# Patient Record
Sex: Male | Born: 1940 | Hispanic: No | Marital: Married | Smoking: Never smoker
Health system: Southern US, Community
[De-identification: ages and names within clinical notes are randomized; demographics above are authoritative.]

## PROBLEM LIST (undated history)

## (undated) DIAGNOSIS — I639 Cerebral infarction, unspecified: Secondary | ICD-10-CM

## (undated) DIAGNOSIS — E7800 Pure hypercholesterolemia, unspecified: Secondary | ICD-10-CM

## (undated) DIAGNOSIS — I099 Rheumatic heart disease, unspecified: Secondary | ICD-10-CM

## (undated) DIAGNOSIS — N183 Chronic kidney disease, stage 3 unspecified: Secondary | ICD-10-CM

## (undated) DIAGNOSIS — N400 Enlarged prostate without lower urinary tract symptoms: Secondary | ICD-10-CM

## (undated) DIAGNOSIS — E119 Type 2 diabetes mellitus without complications: Secondary | ICD-10-CM

## (undated) DIAGNOSIS — H9190 Unspecified hearing loss, unspecified ear: Secondary | ICD-10-CM

## (undated) DIAGNOSIS — I10 Essential (primary) hypertension: Secondary | ICD-10-CM

## (undated) DIAGNOSIS — Z952 Presence of prosthetic heart valve: Secondary | ICD-10-CM

## (undated) DIAGNOSIS — C169 Malignant neoplasm of stomach, unspecified: Secondary | ICD-10-CM

## (undated) DIAGNOSIS — Z7901 Long term (current) use of anticoagulants: Secondary | ICD-10-CM

## (undated) HISTORY — PX: HERNIA REPAIR: SHX51

## (undated) HISTORY — DX: Cerebral infarction, unspecified: I63.9

---

## 2003-03-14 DIAGNOSIS — C169 Malignant neoplasm of stomach, unspecified: Secondary | ICD-10-CM

## 2003-03-14 HISTORY — PX: PARTIAL GASTRECTOMY: SHX2172

## 2003-03-14 HISTORY — DX: Malignant neoplasm of stomach, unspecified: C16.9

## 2004-09-05 ENCOUNTER — Inpatient Hospital Stay (HOSPITAL_COMMUNITY): Admission: EM | Admit: 2004-09-05 | Discharge: 2004-09-08 | Payer: Self-pay | Admitting: Emergency Medicine

## 2004-09-07 ENCOUNTER — Encounter (INDEPENDENT_AMBULATORY_CARE_PROVIDER_SITE_OTHER): Payer: Self-pay | Admitting: *Deleted

## 2004-09-28 ENCOUNTER — Encounter: Admission: RE | Admit: 2004-09-28 | Discharge: 2004-09-28 | Payer: Self-pay | Admitting: Nephrology

## 2005-01-03 ENCOUNTER — Ambulatory Visit (HOSPITAL_COMMUNITY): Admission: RE | Admit: 2005-01-03 | Discharge: 2005-01-03 | Payer: Self-pay | Admitting: Gastroenterology

## 2005-01-03 ENCOUNTER — Encounter (INDEPENDENT_AMBULATORY_CARE_PROVIDER_SITE_OTHER): Payer: Self-pay | Admitting: *Deleted

## 2005-01-17 ENCOUNTER — Ambulatory Visit (HOSPITAL_COMMUNITY): Admission: RE | Admit: 2005-01-17 | Discharge: 2005-01-17 | Payer: Self-pay | Admitting: Gastroenterology

## 2005-11-24 ENCOUNTER — Ambulatory Visit: Payer: Self-pay | Admitting: Internal Medicine

## 2006-04-17 ENCOUNTER — Ambulatory Visit: Payer: Self-pay | Admitting: Internal Medicine

## 2006-04-17 LAB — CONVERTED CEMR LAB
BUN: 18 mg/dL (ref 6–23)
Creatinine, Ser: 1.3 mg/dL (ref 0.4–1.5)
Creatinine,U: 152.8 mg/dL
Hgb A1c MFr Bld: 6.2 % — ABNORMAL HIGH (ref 4.6–6.0)
Microalb Creat Ratio: 9.8 mg/g (ref 0.0–30.0)
Microalb, Ur: 1.5 mg/dL (ref 0.0–1.9)
Potassium: 3.6 meq/L (ref 3.5–5.1)

## 2006-05-22 ENCOUNTER — Ambulatory Visit: Payer: Self-pay | Admitting: Internal Medicine

## 2006-07-30 ENCOUNTER — Encounter: Payer: Self-pay | Admitting: Internal Medicine

## 2006-08-21 ENCOUNTER — Encounter (INDEPENDENT_AMBULATORY_CARE_PROVIDER_SITE_OTHER): Payer: Self-pay | Admitting: Family Medicine

## 2006-09-27 ENCOUNTER — Encounter (INDEPENDENT_AMBULATORY_CARE_PROVIDER_SITE_OTHER): Payer: Self-pay | Admitting: Family Medicine

## 2007-02-28 ENCOUNTER — Encounter: Payer: Self-pay | Admitting: Internal Medicine

## 2007-04-04 ENCOUNTER — Encounter: Payer: Self-pay | Admitting: Internal Medicine

## 2007-09-03 ENCOUNTER — Telehealth (INDEPENDENT_AMBULATORY_CARE_PROVIDER_SITE_OTHER): Payer: Self-pay | Admitting: *Deleted

## 2007-09-06 ENCOUNTER — Encounter (INDEPENDENT_AMBULATORY_CARE_PROVIDER_SITE_OTHER): Payer: Self-pay | Admitting: *Deleted

## 2007-10-08 ENCOUNTER — Encounter: Payer: Self-pay | Admitting: Internal Medicine

## 2007-11-01 ENCOUNTER — Telehealth (INDEPENDENT_AMBULATORY_CARE_PROVIDER_SITE_OTHER): Payer: Self-pay | Admitting: *Deleted

## 2007-11-07 ENCOUNTER — Telehealth (INDEPENDENT_AMBULATORY_CARE_PROVIDER_SITE_OTHER): Payer: Self-pay | Admitting: *Deleted

## 2008-02-08 ENCOUNTER — Emergency Department (HOSPITAL_COMMUNITY): Admission: EM | Admit: 2008-02-08 | Discharge: 2008-02-08 | Payer: Self-pay | Admitting: Emergency Medicine

## 2008-02-20 ENCOUNTER — Encounter: Payer: Self-pay | Admitting: Internal Medicine

## 2008-03-04 ENCOUNTER — Encounter: Payer: Self-pay | Admitting: Internal Medicine

## 2008-03-17 ENCOUNTER — Encounter: Payer: Self-pay | Admitting: Internal Medicine

## 2008-09-15 ENCOUNTER — Encounter: Payer: Self-pay | Admitting: Internal Medicine

## 2010-04-02 ENCOUNTER — Encounter: Payer: Self-pay | Admitting: Gastroenterology

## 2010-07-29 NOTE — Discharge Summary (Signed)
Robert [name], Robert [name]              ACCOUNT NO.:  [id number]   MEDICAL RECORD NO.:  [id number]          PATIENT TYPE:  INP   LOCATION:  5704                         FACILITY:  [facility]   PHYSICIAN:  Theone Stanley, MD   DATE OF BIRTH:  12/04/1940   DATE OF ADMISSION:  09/05/2004  DATE OF DISCHARGE:                                 DISCHARGE SUMMARY   ADMISSION DIAGNOSES:  1.  Acute gastrointestinal bleed.  2.  Anemia of acute blood loss.  3.  Supratherapeutic INR.  4.  Hypertension.  5.  Valvular disease.  6.  Dyslipidemia.  7.  Benign prostatic hypertrophy.   DISCHARGE DIAGNOSES:  1.  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  2.  Anemia of acute blood loss, stable.  3.  Hypertension.  4.  History of valvular heart disease.  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  5.  Dyslipidemia.  6.  Benign prostatic hypertrophy.   CONSULTATIONS:  Dr. Armanda Magic, Dr. Laural Benes of [facility].   PROCEDURES/DIAGNOSTIC TESTS:  The patient had an EGD performed on June 26  which showed gastric antral ulcer on the incisura.  10 mL of 1:10,000  epinephrine was injected in ulcer.  Echocardiogram was performed on June 28  which showed left ventricular systolic function was normal. EF was 65%.  Left ventricular wall thickness is mildly increased.  There is mild focal  basal septal hypertrophy.  Aortic valve thickness was mildly increased.  Trivial aortic valve regurgitation.  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  There was diastolic doming of the mitral valve consistent with  rheumatic deformity.  There is mild reduced mitral valve leaflet excursion.  Mean transmural gradient was 6.1 mmHg.  Mitral valve area by planimetry was  1.43 sq cm.  Mitral valve area by pressure half-time was 1.73 sq cm.  SBE  prophylaxis is recommended.   PERTINENT LABS:  On discharge, the patient's hemoglobin was 9.   HOSPITAL COURSE:  Robert [name] is a very pleasant gentleman who presented with  what appears to be acute GI bleed with melanotic stools and hematemesis.  He  had evidence of hypotension.  The patient was admitted to step down unit.  He was transfused two units of RBCs.  In addition, it was noted that he was  supratherapeutic at 4.0.  He was given vitamin K and fresh frozen plasma.  In the meantime on the 26th, the patient had an EGD which showed a gastric  ulcer.  This was sclerosed and it stopped bleeding.  The next couple of days  the patient improved.  There was no more evidence of bleeding.   Hypertension.  The patient was on hypertensive medications.  However,  because of his presentation this was held.  He was subsequently restarted on  atenolol.  However, his Diovan dose was cut in half, and he was instructed  to check his blood  pressure on a daily basis.  If at some point in time his  pressures were less than 100 systolic, he is to not take his Diovan at that  point in time.  He is be reassessed by his primary care physician at his  office visit.   Valvular heart disease.  This patient was on Coumadin and according to the  patient his cardiologist placed him on Coumadin secondary to his valvular  disease.  It was unclear since he does not have any type of surgery, no  artificial valve, as to why he was on Coumadin.  Dr. Mayford Knife was consulted in  this regard and echo was performed.  Please see results above.  Coumadin was  stopped obviously for his acute issue, and it is unclear whether he needs to  be on Coumadin henceforth.  The case will be discussed with his cardiologist  between him and Dr. Mayford Knife to see if the patient needs to be on Coumadin at  all.   Dyslipidemia.  The patient is to restart his Crestor.   Benign prostatic hypertrophy.  The patient has started his Flomax.    DISCHARGE MEDICATIONS:  1.  The patient is not to take his Coumadin or niacin.  2.  Crestor 10 mg one p.o. daily.  3.  Atenolol 50 mg one p.o. daily.  4.  Diovan/hydrochlorothiazide 160/12.5 mg one p.o. daily. He is to take his      blood pressure on a daily basis.  If his systolic is less than a 100, he      is not to take his Diovan at that time.  5.  Flomax at home dosage.  6.  Protonix 40 mg one p.o. daily for a total of 12 weeks.   Follow up with Dr. Mayford Knife in a month's time; with Dr. Laural Benes as needed, and  Dr. Blossom Hoops in two to three weeks with [facility] labs.   DIET:  The patient is to continue his vegetarian diet.  However, he is not  to have any spicy foods or any acidic foods that might aggravate his ulcer.   DICTATED BY:  Jasmine Pang, M.D.       AEJ/MEDQ  D:  09/08/2004  T:  09/08/2004  Job:  [id number]

## 2010-07-29 NOTE — H&P (Signed)
Robert [name], Robert [name]              ACCOUNT NO.:  [id number]   MEDICAL RECORD NO.:  [id number]          PATIENT TYPE:  EMS   LOCATION:  MAJO                         FACILITY:  [facility]   PHYSICIAN:  Jackie Plum, M.D.DATE OF BIRTH:  Feb 15, 1941   DATE OF ADMISSION:  09/05/2004  DATE OF DISCHARGE:                                HISTORY & PHYSICAL   PROBLEM LIST:  1.  Acute gastrointestinal bleed, likely upper gastrointestinal source.  2.  Anemia of acute blood loss.  3.  Supratherapeutic INR.  4.  History of hypertension.  5.  Valvular heart disease.  6.  Dyslipidemia.  7.  Benign prostatic hypertrophy.   CHIEF COMPLAINT:  Bloody vomiting and black tarry stools.   HISTORY OF PRESENT ILLNESS:  Patient came to [facility]  ER with his wife with above complaint.  According to the wife and the  patient, patient had been in his usual state of health until some time  yesterday afternoon when he had an episode of vomiting.  He felt very weak  somehow as well.  Then today, he had an episode of bloody vomiting which was  dark in color with black tarry stools.  He felt very weak and his blood  pressure was noted to be low.  Patient was therefore brought to the ED for  further evaluation.  At the emergency room, patient was noted to be very  hypotensive with initial blood pressure in the 70s.  Dr. Danise Edge of  GI medicine was consulted by Dr. Ignacia Palma of emergency medicine, however, he  was at [facility] at the time apparently performing endoscopic  study and patient was admitted to Hospitalists service after initial  resuscitation with IV fluids and initiation of packed red blood cell  transfusion.  Patient denies any history of chest pain.  He feels very  fatigued but does not really have any shortness of breath.  He denies any  fever or chills.  He denies any cough, sputum production.  He denies any  blood in his urine or any hemoptysis.  He denies any  abdominal pain.  He  denies any constipation or diarrhea.  He has been a little bit nauseated  according to the patient.   MEDICATIONS:  The patient's current medications are:  1.  Coumadin 5 mg tablets one tablet daily.  2.  Atenolol 50 mg one tablet daily.  3.  Crestor 10 mg one tablet daily.  4.  Flomax 0.5 mg capsule daily.  5.  Niaspan 500 mg extended release tablets daily.  6.  Diovan/hydrochlorothiazide 150/12.5 mg one in the morning and one in the      evening.   FAMILY HISTORY:  Negative for any known heart disease or any bowel cancers  according to the wife.   SOCIAL HISTORY:  Patient is an immigrant from [place].  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  He has two grown children who  are away from home.  He does not smoke cigarettes nor drink alcohol.   REVIEW OF SYMPTOMS:  Significant positives and  negatives as presented in  HPI, otherwise unremarkable.   PHYSICAL EXAMINATION:  GENERAL APPEARANCE:  Patient was not in acute  cardiopulmonary distress.  He was lying on a gurney receiving oxygen by  nasal cannula.  VITAL SIGNS:  Blood pressure was 88/53, pulse 56, respirations 17,  temperature 97.7 degrees F.  Oxygen 100% by pulse oximetry.  HEENT:  Normocephalic and atraumatic.  Pupils are equal, round and reactive  to light.  Extraocular movements intact.  Pharynx moist.  He had  conjunctival pallor without icterus.  NECK:  Supple.  No JVD.  LUNGS:  Clear to auscultation bilaterally.  CARDIOVASCULAR:  Regular, no gallops.  He had a stage 3/6 systolic murmur,  not radiating, at lower sternal border.  ABDOMEN:  Full, soft and nontender.  There was no obvious hepatosplenomegaly  appreciated by me in the ED.  EXTREMITIES:  No cyanosis or edema.  CNS:  Patient was alert and oriented x3, no acute focal deficit.   LABORATORY DATA:  A wbc count of 15.1, hemoglobin 9.9, hematocrit 29.0,  platelet count 211.  Protime 37.8, INR 3.9, PTT 40.  Sodium  139, potassium  3.6, chloride 102, CO2 29, BUN 94, creatinine 2.4.  Total bilirubin 0.8,  alkaline phosphatase 37, AST 31, ALT 64, total protein 5.6, albumin 3.1,  calcium 8.2.   IMPRESSION:  1.  Acute gastrointestinal bleed.  Patient has elevated BUN related to his      bleed, likely to be upper gastrointestinal source.  2.  He has azotemia in addition to elevated AST.   Patient admitted to ICU, pending endoscopic evaluation by GI medicine.  Will  start  him on PPI, added fluid supplementation with blood transfusion.  We  will hold his antihypertensive medications.  Will give him FFP to correct  his supratherapeutic INR. Vitamin K may be given cautiously to prevent  vitamin K resistance at a later date.  Will get a routine x-ray and 12-lead  EKG.       GO/MEDQ  D:  09/05/2004  T:  09/05/2004  Job:  [id number]   cc:   Leanne Chang, M.D.  [address]  [place]  [place] [address]  Fax: [phone]   Danise Edge, M.D.  [address]  [place]  [place] [address]  Fax: [phone]

## 2010-07-29 NOTE — Op Note (Signed)
NAMESEBRON, Robert [name]              ACCOUNT NO.:  [id number]   MEDICAL RECORD NO.:  [id number]          PATIENT TYPE:  AMB   LOCATION:  ENDO                         [facility]AC[facility]   PHYSICIAN:  Danise Edge, M.D.   DATE OF BIRTH:  1940/05/14   DATE OF PROCEDURE:  01/03/2005  DATE OF DISCHARGE:                                 OPERATIVE REPORT   PROCEDURE:  Esophagogastroduodenoscopy.   PROCEDURE INDICATIONS:  Mr. Robert [name] is a 70 year old male, born  [birth year].  Mr. Robert [name] was hospitalized at [facility] in late  June 2006 to treat a bleeding gastric ulcer on the incisura.  He has  completed a course of proton pump inhibitor therapy.  Repeat  esophagogastroduodenoscopy is performed to confirm gastric ulcer healing.   ENDOSCOPIST:  Dr. Reece Agar   PREMEDICATION:  1.  Demerol 30 mg.  2.  Versed 4 mg.   PROCEDURE:  After obtaining informed consent, Mr. Robert [name] was placed in the  left lateral decubitus position.  I administered intravenous Demerol and  intravenous Versed to achieve conscious sedation for the procedure.  The  patient's blood pressure, oxygen saturation, and cardiac rhythm were  monitored throughout the procedure and documented in the medical record.   The Olympus gastroscope was passed through the posterior hypopharynx into  the proximal esophagus without difficulty.  The hypopharynx and larynx  appeared normal.  I did not visualize the vocal cords.   Esophagoscopy:  The proximal, mid, and lower segments of the esophageal  mucosa appeared normal.   Gastroscopy:  Retroflexed view of the gastric cardia and fundus was normal.  The gastric body appears normal.  The gastric incisura ulcer has healed with  mucosal scarring.  Multiple biopsies were taken.  The gastric antrum and  pylorus appeared normal.   Duodenoscopy:  The duodenal bulb, second portion of duodenum, and third  portion of duodenum appeared normal.   ASSESSMENT:  Healed gastric  incisural ulcer with mucosal scarring at the  ulcer site.  Biopsies were performed.           ______________________________  Danise Edge, M.D.     MJ/MEDQ  D:  01/03/2005  T:  01/03/2005  Job:  [id number]   cc:   Carollee Massed  Fax: [phone]

## 2010-07-29 NOTE — Op Note (Signed)
[name], Robert NO.:  [id number]   MEDICAL RECORD NO.:  [id number]          PATIENT TYPE:  INP   LOCATION:  2115                         FACILITY:  [facility]   PHYSICIAN:  Danise Edge, M.D.   DATE OF BIRTH:  [birth year]   DATE OF PROCEDURE:  09/05/2004  DATE OF DISCHARGE:                                 OPERATIVE REPORT   PROCEDURE:  Esophagogastroduodenoscopy.   PROCEDURE INDICATION:  Robert [name] is a 70 year old male born  30-Jan-1941.  Robert [name] is taking Coumadin.  He presented to the [facility],  emergency room with upper gastrointestinal bleeding.  There is no history of  peptic ulcer disease.  He denies nonsteroidal anti-inflammatory medication  use.   ENDOSCOPIST:  Danise Edge, M.D.   PREMEDICATION:  Versed 6 mg, fentanyl 50 mcg.   PROCEDURE:  After obtaining informed consent, Robert [name] was placed in the  left lateral decubitus position.  I administered intravenous fentanyl and  intravenous Versed to achieve conscious sedation for the procedure.  The  patient's blood pressure, oxygen saturation and cardiac rhythm were  monitored throughout the procedure and documented in the medical record.   The Olympus gastroscope was passed through the posterior hypopharynx into  the proximal esophagus without difficulty.  The hypopharynx, larynx and  vocal cords appeared normal.   ESOPHAGOSCOPY:  The proximal, mid and lower segments of the esophageal  mucosa appeared normal.   GASTROSCOPY:  Upon entering the proximal stomach, there was a large, red,  gelatinous blood clot lying along the greater curvature aspect of the  gastric body and into the gastric fundus.  Retroflexed view of the gastric  cardia and what I had could visualize of the gastric fundus appeared normal.  What I could visualize of the gastric body appeared normal.  On the incisura  was a 3 x 3-mm ulcer with central blood clot.  The remainder of the gastric  antrum and pylorus  appeared normal.  Ten milliliters of 1:10,000 epinephrine  were injected in 2-mL increments around the ulcer and centrally into the  ulcer base.  The blood clot washed off with water irrigation.  Two Endo  Clips were applied to the ulcer base.   DUODENOSCOPY:  The duodenal bulb and descending duodenum appeared normal.   ASSESSMENT:  Upper gastrointestinal bleeding secondary to a gastric antral  ulcer on the incisura.  Two Endo Clips were applied to the ulcer base  preceded by injecting 10 mL of 1:10,000 epinephrine.  A biopsy was taken  from the distal gastric antrum for CLO-test.   This ulcer is at high risk for rebleeding.  His Coumadin has been reversed  with fresh frozen plasma.  He is receiving packed red blood cell  transfusion.  He is hemodynamically stable.  I will start a continuous  intravenous Protonix drip.   If rebleeding occurs, repeat endoscopic therapy should be performed.       MJ/MEDQ  D:  09/05/2004  T:  09/06/2004  Job:  [id number]   cc:   [facility]

## 2013-03-13 DIAGNOSIS — Z952 Presence of prosthetic heart valve: Secondary | ICD-10-CM

## 2013-03-13 HISTORY — DX: Presence of prosthetic heart valve: Z95.2

## 2013-10-24 HISTORY — PX: MITRAL VALVE REPLACEMENT: SHX147

## 2015-05-06 ENCOUNTER — Emergency Department (HOSPITAL_COMMUNITY): Payer: BC Managed Care – PPO

## 2015-05-06 ENCOUNTER — Inpatient Hospital Stay (HOSPITAL_COMMUNITY)
Admission: EM | Admit: 2015-05-06 | Discharge: 2015-05-13 | DRG: 286 | Disposition: A | Discharge status: Home / Self Care | Payer: BC Managed Care – PPO | Attending: Internal Medicine | Admitting: Internal Medicine

## 2015-05-06 ENCOUNTER — Encounter (HOSPITAL_COMMUNITY): Payer: Self-pay | Admitting: Family Medicine

## 2015-05-06 DIAGNOSIS — I160 Hypertensive urgency: Secondary | ICD-10-CM | POA: Diagnosis present

## 2015-05-06 DIAGNOSIS — R0600 Dyspnea, unspecified: Secondary | ICD-10-CM | POA: Diagnosis not present

## 2015-05-06 DIAGNOSIS — Z903 Acquired absence of stomach [part of]: Secondary | ICD-10-CM

## 2015-05-06 DIAGNOSIS — Z880 Allergy status to penicillin: Secondary | ICD-10-CM

## 2015-05-06 DIAGNOSIS — Z7982 Long term (current) use of aspirin: Secondary | ICD-10-CM | POA: Diagnosis not present

## 2015-05-06 DIAGNOSIS — E1122 Type 2 diabetes mellitus with diabetic chronic kidney disease: Secondary | ICD-10-CM | POA: Diagnosis present

## 2015-05-06 DIAGNOSIS — Z85028 Personal history of other malignant neoplasm of stomach: Secondary | ICD-10-CM | POA: Diagnosis not present

## 2015-05-06 DIAGNOSIS — I509 Heart failure, unspecified: Secondary | ICD-10-CM

## 2015-05-06 DIAGNOSIS — N400 Enlarged prostate without lower urinary tract symptoms: Secondary | ICD-10-CM | POA: Diagnosis present

## 2015-05-06 DIAGNOSIS — I2510 Atherosclerotic heart disease of native coronary artery without angina pectoris: Secondary | ICD-10-CM | POA: Diagnosis present

## 2015-05-06 DIAGNOSIS — D649 Anemia, unspecified: Secondary | ICD-10-CM | POA: Diagnosis present

## 2015-05-06 DIAGNOSIS — E785 Hyperlipidemia, unspecified: Secondary | ICD-10-CM | POA: Diagnosis present

## 2015-05-06 DIAGNOSIS — Z7901 Long term (current) use of anticoagulants: Secondary | ICD-10-CM | POA: Diagnosis not present

## 2015-05-06 DIAGNOSIS — H9190 Unspecified hearing loss, unspecified ear: Secondary | ICD-10-CM | POA: Diagnosis present

## 2015-05-06 DIAGNOSIS — I5023 Acute on chronic systolic (congestive) heart failure: Secondary | ICD-10-CM | POA: Diagnosis present

## 2015-05-06 DIAGNOSIS — I5022 Chronic systolic (congestive) heart failure: Secondary | ICD-10-CM | POA: Diagnosis present

## 2015-05-06 DIAGNOSIS — N183 Chronic kidney disease, stage 3 unspecified: Secondary | ICD-10-CM | POA: Diagnosis present

## 2015-05-06 DIAGNOSIS — I5041 Acute combined systolic (congestive) and diastolic (congestive) heart failure: Secondary | ICD-10-CM | POA: Diagnosis not present

## 2015-05-06 DIAGNOSIS — E876 Hypokalemia: Secondary | ICD-10-CM | POA: Diagnosis present

## 2015-05-06 DIAGNOSIS — R0602 Shortness of breath: Secondary | ICD-10-CM | POA: Diagnosis present

## 2015-05-06 DIAGNOSIS — Z952 Presence of prosthetic heart valve: Secondary | ICD-10-CM

## 2015-05-06 DIAGNOSIS — I130 Hypertensive heart and chronic kidney disease with heart failure and stage 1 through stage 4 chronic kidney disease, or unspecified chronic kidney disease: Principal | ICD-10-CM | POA: Diagnosis present

## 2015-05-06 DIAGNOSIS — Z8711 Personal history of peptic ulcer disease: Secondary | ICD-10-CM

## 2015-05-06 DIAGNOSIS — D638 Anemia in other chronic diseases classified elsewhere: Secondary | ICD-10-CM | POA: Diagnosis present

## 2015-05-06 DIAGNOSIS — Z79899 Other long term (current) drug therapy: Secondary | ICD-10-CM

## 2015-05-06 DIAGNOSIS — I272 Other secondary pulmonary hypertension: Secondary | ICD-10-CM | POA: Diagnosis present

## 2015-05-06 DIAGNOSIS — Z954 Presence of other heart-valve replacement: Secondary | ICD-10-CM | POA: Diagnosis not present

## 2015-05-06 HISTORY — DX: Type 2 diabetes mellitus without complications: E11.9

## 2015-05-06 HISTORY — DX: Pure hypercholesterolemia, unspecified: E78.00

## 2015-05-06 HISTORY — DX: Chronic kidney disease, stage 3 unspecified: N18.30

## 2015-05-06 HISTORY — DX: Chronic kidney disease, stage 3 (moderate): N18.3

## 2015-05-06 HISTORY — DX: Benign prostatic hyperplasia without lower urinary tract symptoms: N40.0

## 2015-05-06 HISTORY — DX: Rheumatic heart disease, unspecified: I09.9

## 2015-05-06 HISTORY — DX: Unspecified hearing loss, unspecified ear: H91.90

## 2015-05-06 HISTORY — DX: Long term (current) use of anticoagulants: Z79.01

## 2015-05-06 HISTORY — DX: Malignant neoplasm of stomach, unspecified: C16.9

## 2015-05-06 HISTORY — DX: Presence of prosthetic heart valve: Z95.2

## 2015-05-06 HISTORY — DX: Essential (primary) hypertension: I10

## 2015-05-06 LAB — BASIC METABOLIC PANEL
Anion gap: 9 (ref 5–15)
BUN: 14 mg/dL (ref 6–20)
CO2: 25 mmol/L (ref 22–32)
Calcium: 8 mg/dL — ABNORMAL LOW (ref 8.9–10.3)
Chloride: 108 mmol/L (ref 101–111)
Creatinine, Ser: 1.59 mg/dL — ABNORMAL HIGH (ref 0.61–1.24)
GFR calc Af Amer: 47 mL/min — ABNORMAL LOW (ref >60)
GFR, EST NON AFRICAN AMERICAN: 41 mL/min — AB (ref >60)
Glucose, Bld: 277 mg/dL — ABNORMAL HIGH (ref 65–99)
Potassium: 4.4 mmol/L (ref 3.5–5.1)
Sodium: 142 mmol/L (ref 135–145)

## 2015-05-06 LAB — CBC
HCT: 29.4 % — ABNORMAL LOW (ref 39.0–52.0)
Hemoglobin: 9.4 g/dL — ABNORMAL LOW (ref 13.0–17.0)
MCH: 30.2 pg (ref 26.0–34.0)
MCHC: 32 g/dL (ref 30.0–36.0)
MCV: 94.5 fL (ref 78.0–100.0)
PLATELETS: 218 10*3/uL (ref 150–400)
RBC: 3.11 MIL/uL — ABNORMAL LOW (ref 4.22–5.81)
RDW: 14 % (ref 11.5–15.5)
WBC: 5.2 10*3/uL (ref 4.0–10.5)

## 2015-05-06 LAB — RETICULOCYTES
RBC.: 3.15 MIL/uL — ABNORMAL LOW (ref 4.22–5.81)
Retic Count, Absolute: 69.3 10*3/uL (ref 19.0–186.0)
Retic Ct Pct: 2.2 % (ref 0.4–3.1)

## 2015-05-06 LAB — IRON AND TIBC
Iron: 32 ug/dL — ABNORMAL LOW (ref 45–182)
Saturation Ratios: 9 % — ABNORMAL LOW (ref 17.9–39.5)
TIBC: 372 ug/dL (ref 250–450)
UIBC: 340 ug/dL

## 2015-05-06 LAB — I-STAT TROPONIN, ED: Troponin i, poc: 0.02 ng/mL (ref 0.00–0.08)

## 2015-05-06 LAB — PROTIME-INR
INR: 4.37 — ABNORMAL HIGH (ref 0.00–1.49)
Prothrombin Time: 40.6 seconds — ABNORMAL HIGH (ref 11.6–15.2)

## 2015-05-06 LAB — BRAIN NATRIURETIC PEPTIDE: B Natriuretic Peptide: 1406.9 pg/mL — ABNORMAL HIGH (ref 0.0–100.0)

## 2015-05-06 LAB — FERRITIN: Ferritin: 37 ng/mL (ref 24–336)

## 2015-05-06 LAB — VITAMIN B12: Vitamin B-12: 114 pg/mL — ABNORMAL LOW (ref 180–914)

## 2015-05-06 MED ORDER — TAMSULOSIN HCL 0.4 MG PO CAPS
0.4000 mg | ORAL_CAPSULE | Freq: Every day | ORAL | Status: DC
  Administered 2015-05-07 – 2015-05-12 (×5): 0.4 mg via ORAL
  Filled 2015-05-06 (×5): qty 1

## 2015-05-06 MED ORDER — NITROGLYCERIN 0.4 MG SL SUBL: 0.4000 mg | SUBLINGUAL_TABLET | SUBLINGUAL | Status: DC | PRN

## 2015-05-06 MED ORDER — ACETAMINOPHEN 325 MG PO TABS: 650.0000 mg | ORAL_TABLET | Freq: Four times a day (QID) | ORAL | Status: DC | PRN

## 2015-05-06 MED ORDER — ONDANSETRON HCL 4 MG/2ML IJ SOLN: 4.0000 mg | Freq: Four times a day (QID) | INTRAMUSCULAR | Status: DC | PRN

## 2015-05-06 MED ORDER — FERROUS SULFATE 325 (65 FE) MG PO TABS
325.0000 mg | ORAL_TABLET | Freq: Two times a day (BID) | ORAL | Status: DC
  Administered 2015-05-07 – 2015-05-13 (×11): 325 mg via ORAL
  Filled 2015-05-06 (×11): qty 1

## 2015-05-06 MED ORDER — FUROSEMIDE 10 MG/ML IJ SOLN
40.0000 mg | Freq: Two times a day (BID) | INTRAMUSCULAR | Status: DC
  Administered 2015-05-07: 40 mg via INTRAVENOUS
  Filled 2015-05-06: qty 4

## 2015-05-06 MED ORDER — NITROGLYCERIN 0.4 MG/HR TD PT24
0.4000 mg | MEDICATED_PATCH | Freq: Every day | TRANSDERMAL | Status: DC
  Administered 2015-05-07 – 2015-05-08 (×2): 0.4 mg via TRANSDERMAL
  Filled 2015-05-06 (×3): qty 1

## 2015-05-06 MED ORDER — ATORVASTATIN CALCIUM 20 MG PO TABS
20.0000 mg | ORAL_TABLET | Freq: Every day | ORAL | Status: DC
  Administered 2015-05-07 – 2015-05-12 (×7): 20 mg via ORAL
  Filled 2015-05-06 (×2): qty 1
  Filled 2015-05-06: qty 2
  Filled 2015-05-06 (×4): qty 1

## 2015-05-06 MED ORDER — ADULT MULTIVITAMIN W/MINERALS CH
1.0000 | ORAL_TABLET | Freq: Every day | ORAL | Status: DC
  Administered 2015-05-07 – 2015-05-13 (×7): 1 via ORAL
  Filled 2015-05-06 (×7): qty 1

## 2015-05-06 MED ORDER — ONDANSETRON HCL 4 MG PO TABS: 4.0000 mg | ORAL_TABLET | Freq: Four times a day (QID) | ORAL | Status: DC | PRN

## 2015-05-06 MED ORDER — SODIUM CHLORIDE 0.9% FLUSH
3.0000 mL | Freq: Two times a day (BID) | INTRAVENOUS | Status: DC
  Administered 2015-05-07: 3 mL via INTRAVENOUS

## 2015-05-06 MED ORDER — ASPIRIN 81 MG PO CHEW
81.0000 mg | CHEWABLE_TABLET | Freq: Every day | ORAL | Status: DC
  Administered 2015-05-07 – 2015-05-09 (×3): 81 mg via ORAL
  Filled 2015-05-06 (×3): qty 1

## 2015-05-06 MED ORDER — IRBESARTAN 75 MG PO TABS
37.5000 mg | ORAL_TABLET | Freq: Every day | ORAL | Status: DC
  Administered 2015-05-07: 37.5 mg via ORAL
  Filled 2015-05-06 (×2): qty 0.5

## 2015-05-06 MED ORDER — FUROSEMIDE 10 MG/ML IJ SOLN
40.0000 mg | Freq: Once | INTRAMUSCULAR | Status: AC
  Administered 2015-05-06: 40 mg via INTRAVENOUS
  Filled 2015-05-06: qty 4

## 2015-05-06 MED ORDER — SPIRONOLACTONE 25 MG PO TABS
25.0000 mg | ORAL_TABLET | Freq: Every day | ORAL | Status: DC
  Administered 2015-05-07: 25 mg via ORAL
  Filled 2015-05-06: qty 1

## 2015-05-06 MED ORDER — LABETALOL HCL 5 MG/ML IV SOLN
10.0000 mg | Freq: Once | INTRAVENOUS | Status: DC
  Administered 2015-05-06: 10 mg via INTRAVENOUS
  Filled 2015-05-06: qty 4

## 2015-05-06 MED ORDER — HYDRALAZINE HCL 20 MG/ML IJ SOLN: 10.0000 mg | INTRAMUSCULAR | Status: DC | PRN

## 2015-05-06 MED ORDER — ACETAMINOPHEN 650 MG RE SUPP: 650.0000 mg | Freq: Four times a day (QID) | RECTAL | Status: DC | PRN

## 2015-05-06 NOTE — H&P (Addendum)
[facility] History and Physical  Robert [name] [id number] DOB: 1940/12/16 DOA: 05/06/2015  Referring physician: Dr. Alberteen Sam. PCP: [name], MD  Specialists: Patient follows up at [facility] and [facility].  Chief Complaint: Shortness of breath.  HPI: Robert [name] is a 75 y.o. male with history of mechanical mitral valve, hypertension, chronic anemia chronic disease and history of gastric ulcer presents to the ER because of worsening shortness of breath. Patient has been getting increasingly short of breath on exertion over the last 4-5 days and patient's primary care physician had placed patient on Lasix first dose of which patient took yesterday. Despite taking which patient was still short of breath and had gone to the urgent care yesterday and was given spironolactone. Patient became acutely short of breath and presented to the ER. Chest x-ray shows pulmonary edema and patient's blood pressure was significantly elevated with systolic blood pressure more than 200. Patient denies any chest pain fever chills have some productive cough. Patient has been admitted for acute pulmonary edema and hypertensive urgency.   Review of Systems: As presented in the history of presenting illness, rest negative.  Past Medical History  Diagnosis Date  . Hypertension   . High cholesterol   . Gastric ulcer    Past Surgical History  Procedure Laterality Date  . Cardiac surgery     Social History:  reports that he has never smoked. He does not have any smokeless tobacco history on file. He reports that he does not drink alcohol or use illicit drugs. Where does patient live home. Can patient participate in ADLs? Yes.  Allergies  Allergen Reactions  . Penicillins Other (See Comments)    Family History:  Family History  Problem Relation Age of Onset  . Hypertension Other       Prior to Admission medications   Medication Sig Start Date End Date Taking?  Authorizing Provider  aspirin 81 MG chewable tablet Chew 81 mg by mouth daily.   Yes Historical Provider, MD  atorvastatin (LIPITOR) 20 MG tablet Take 20 mg by mouth at bedtime.   Yes Historical Provider, MD  ferrous sulfate 325 (65 FE) MG tablet Take 325 mg by mouth 2 (two) times daily with a meal.   Yes Historical Provider, MD  furosemide (LASIX) 40 MG tablet Take 40 mg by mouth daily.   Yes Historical Provider, MD  Multiple Vitamin (MULTIVITAMIN WITH MINERALS) TABS tablet Take 1 tablet by mouth daily.   Yes Historical Provider, MD  spironolactone (ALDACTONE) 25 MG tablet Take 25 mg by mouth daily.   Yes Historical Provider, MD  tamsulosin (FLOMAX) 0.4 MG CAPS capsule Take 0.4 mg by mouth daily after supper.   Yes Historical Provider, MD  valsartan (DIOVAN) 80 MG tablet Take 80 mg by mouth daily.   Yes Historical Provider, MD  warfarin (COUMADIN) 3 MG tablet Take 3 mg by mouth See admin instructions. Take 1 and 1/2 tablets every day except take 1 tablet on Sunday, Tuesday and Friday   Yes Historical Provider, MD    Physical Exam: Filed Vitals:   05/06/15 2215 05/06/15 2230 05/06/15 2245 05/06/15 2300  BP: 150/135 149/69 165/78 151/73  Pulse: 82 84 75 74  Temp:      TempSrc:      Resp: 18 20 17 16   Weight:      SpO2: 98% 100% 100% 98%     General:  Moderately built and nourished.  Eyes: Anicteric no pallor.  ENT: No discharge  from the ears eyes nose and mouth.  Neck: JVD elevated. No mass felt.  Cardiovascular: S1 and S2 heard.  Respiratory: No rhonchi or crepitations.  Abdomen: Soft nontender bowel sounds present.  Skin: No rash.  Musculoskeletal: Mild bilateral lower extremity edema.  Psychiatric: Appears normal.  Neurologic: Alert awake oriented to time place and person. Moves all extremities.  Labs on Admission:  Basic Metabolic Panel:  Recent Labs Lab 05/06/15 1406  NA 142  K 4.4  CL 108  CO2 25  GLUCOSE 277*  BUN 14  CREATININE 1.59*  CALCIUM 8.0*    Liver Function Tests: No results for input(s): AST, ALT, ALKPHOS, BILITOT, PROT, ALBUMIN in the last 168 hours. No results for input(s): LIPASE, AMYLASE in the last 168 hours. No results for input(s): AMMONIA in the last 168 hours. CBC:  Recent Labs Lab 05/06/15 1406  WBC 5.2  HGB 9.4*  HCT 29.4*  MCV 94.5  PLT 218   Cardiac Enzymes: No results for input(s): CKTOTAL, CKMB, CKMBINDEX, TROPONINI in the last 168 hours.  BNP (last 3 results)  Recent Labs  05/06/15 1406  BNP 1406.9*    ProBNP (last 3 results) No results for input(s): PROBNP in the last 8760 hours.  CBG: No results for input(s): GLUCAP in the last 168 hours.  Radiological Exams on Admission: Dg Chest 2 View  05/06/2015  CLINICAL DATA:  Shortness of breath, weakness and cough for 2 days, hypertension EXAM: CHEST  2 VIEW COMPARISON:  05/05/2015 FINDINGS: Enlargement of cardiac silhouette post MVR. Mediastinal contours and pulmonary vascularity normal. Atherosclerotic calcification aorta. Emphysematous and minimal bronchitic changes consistent with COPD. Bibasilar small pleural effusions greater on RIGHT. Minimal basilar atelectasis bilaterally. Upper lungs clear. No pneumothorax. Bones demineralized. IMPRESSION: Enlargement of cardiac silhouette post MVR. COPD changes with bibasilar small pleural effusions and basilar atelectasis. When compared to the previous exam, slightly improved bibasilar aeration. Electronically Signed   By: Lavonia Dana M.D.   On: 05/06/2015 14:28    EKG: Independently reviewed. Normal sinus rhythm with nonspecific ST changes.  Assessment/Plan Principal Problem:   Acute on chronic systolic (congestive) heart failure (HCC) Active Problems:   Hypertensive urgency   CKD (chronic kidney disease) stage 3, GFR 30-59 ml/min   Chronic anemia   H/O mitral valve replacement with mechanical valve   CHF (congestive heart failure) ([place])   1. Acute pulmonary edema probably from decompensated  systolic heart failure - patient was given Lasix 40 mg IV and labetalol 1 dose by the ER physician following which patient's blood pressure has improved at this time. Patient's breathing also has improved. Patient will be closely monitored in telemetry and I have placed patient on Lasix 40 mg IV every 12. Patient has been recently started on spironolactone history which we will continue for now and closely follow daily weights metabolic panel. Check 2-D echo. Cycle cardiac markers. 2. Hypertensive urgency - at this time include Lasix and labetalol given by the ER physician. Will try to work any beta blockers for now due to acute decompensated CHF. Closely monitor blood pressure trends. Patient is on ARB and recently added spironolactone. If creatinine worsens then may have to hold. 3. Chronic kidney disease stage III - creatinine appears to be at baseline. Closely follow metabolic panel. See #2. 4. Chronic anemia with history of gastric ulcer and iron deficiency - continue iron supplements follow CBC. 5. History of mechanical mitral valve replacement on Coumadin - Coumadin per pharmacy. Since patient has acute decompensation check 2-D  echo. 6. Hyperglycemia - check hemoglobin A1c.   DVT Prophylaxis Coumadin.  Code Status: Full code.  Family Communication: Patient's wife.  Disposition Plan: Admit to inpatient.    KAKRAKANDY,ARSHAD N. [facility] Pager [phone].  If 7PM-7AM, please contact night-coverage [email or web address] Password [facility] 05/06/2015, 11:23 PM

## 2015-05-06 NOTE — ED Notes (Signed)
Dr kakrakandy at bedside.  

## 2015-05-06 NOTE — ED Notes (Signed)
Dr Hal Hope

## 2015-05-06 NOTE — ED Notes (Signed)
Pt here for SOB x 1 week and BLE swelling worse in the last 24 hours.

## 2015-05-06 NOTE — ED Provider Notes (Signed)
CSN: [id number]     Arrival date & time 05/06/15  1357 History   First MD Initiated Contact with Patient 05/06/15 2005     Chief Complaint  Patient presents with  . Shortness of Breath     (Consider location/radiation/quality/duration/timing/severity/associated sxs/prior Treatment) Patient is a 75 y.o. male presenting with shortness of breath. The history is provided by the patient and the spouse.  Shortness of Breath Severity:  Moderate Onset quality:  Gradual Duration:  2 weeks Timing:  Constant Progression:  Unchanged Chronicity:  New Context: not URI   Context comment:  Gaining weight, 10 lb weight gain in 10 days Relieved by:  Nothing Worsened by:  Nothing tried Ineffective treatments:  None tried   Past Medical History  Diagnosis Date  . Hypertension   . High cholesterol    Past Surgical History  Procedure Laterality Date  . Cardiac surgery     History reviewed. No pertinent family history. Social History  Substance Use Topics  . Smoking status: Never Smoker   . Smokeless tobacco: None  . Alcohol Use: No    Review of Systems  Respiratory: Positive for shortness of breath.   All other systems reviewed and are negative.     Allergies  Penicillins  Home Medications   Prior to Admission medications   Not on File   BP 204/97 mmHg  Pulse 79  Temp(Src) 97.9 F (36.6 C) (Oral)  Resp 17  Wt 136 lb (61.689 kg)  SpO2 100% Physical Exam  Constitutional: He is oriented to person, place, and time. He appears well-developed and well-nourished. No distress.  HENT:  Head: Normocephalic and atraumatic.  Eyes: Conjunctivae are normal.  Neck: Neck supple. No tracheal deviation present.  Cardiovascular: Normal rate, regular rhythm and normal heart sounds.   Pulmonary/Chest: Effort normal. No respiratory distress. He has no wheezes. He has rales (bibasilar).  Abdominal: Soft. He exhibits no distension. There is no tenderness. There is no rebound.   Neurological: He is alert and oriented to person, place, and time.  Skin: Skin is warm and dry.  Psychiatric: He has a normal mood and affect.    ED Course  Procedures (including critical care time)  Emergency Focused Ultrasound Exam Limited Ultrasound of the Heart and Pericardium  Performed and interpreted by Dr. Laneta Simmers Indication: shortness of breath Multiple views of the heart, pericardium, and IVC are obtained with a multi frequency probe.  Findings: decreased contractility, no anechoic fluid, no IVC collapse Interpretation: severely depressed ejection fraction, no pericardial effusion, elevated CVP Images archived electronically.  CPT Code: [id number]   Labs Review Labs Reviewed  BASIC METABOLIC PANEL - Abnormal; Notable for the following:    Glucose, Bld 277 (*)    Creatinine, Ser 1.59 (*)    Calcium 8.0 (*)    GFR calc non Af Amer 41 (*)    GFR calc Af Amer 47 (*)    All other components within normal limits  CBC - Abnormal; Notable for the following:    RBC 3.11 (*)    Hemoglobin 9.4 (*)    HCT 29.4 (*)    All other components within normal limits  BRAIN NATRIURETIC PEPTIDE - Abnormal; Notable for the following:    B Natriuretic Peptide 1406.9 (*)    All other components within normal limits  PROTIME-INR - Abnormal; Notable for the following:    Prothrombin Time 40.6 (*)    INR 4.37 (*)    All other components within normal limits  VITAMIN B12 -  Abnormal; Notable for the following:    Vitamin B-12 114 (*)    All other components within normal limits  IRON AND TIBC - Abnormal; Notable for the following:    Iron 32 (*)    Saturation Ratios 9 (*)    All other components within normal limits  RETICULOCYTES - Abnormal; Notable for the following:    RBC. 3.15 (*)    All other components within normal limits  FOLATE  FERRITIN  TROPONIN I  TROPONIN I  TROPONIN I  COMPREHENSIVE METABOLIC PANEL  CBC WITH DIFFERENTIAL/PLATELET  PROTIME-INR  Randolm Idol, ED     Imaging Review Dg Chest 2 View  05/06/2015  CLINICAL DATA:  Shortness of breath, weakness and cough for 2 days, hypertension EXAM: CHEST  2 VIEW COMPARISON:  05/05/2015 FINDINGS: Enlargement of cardiac silhouette post MVR. Mediastinal contours and pulmonary vascularity normal. Atherosclerotic calcification aorta. Emphysematous and minimal bronchitic changes consistent with COPD. Bibasilar small pleural effusions greater on RIGHT. Minimal basilar atelectasis bilaterally. Upper lungs clear. No pneumothorax. Bones demineralized. IMPRESSION: Enlargement of cardiac silhouette post MVR. COPD changes with bibasilar small pleural effusions and basilar atelectasis. When compared to the previous exam, slightly improved bibasilar aeration. Electronically Signed   By: Lavonia Dana M.D.   On: 05/06/2015 14:28   I have personally reviewed and evaluated these images and lab results as part of my medical decision-making.   EKG Interpretation   Date/Time:  Thursday May 06 2015 14:02:56 EST Ventricular Rate:  78 PR Interval:  156 QRS Duration: 78 QT Interval:  390 QTC Calculation: 444 R Axis:   49 Text Interpretation:  Sinus rhythm with Premature supraventricular  complexes Septal infarct , age undetermined Lateral infarct , age  undetermined Abnormal ECG Since last tracing prominent wave in V1 is  improved Confirmed by Miniya Miguez MD, Jacobb Alen [phone]) on 05/06/2015 8:18:28 PM      MDM   Final diagnoses:  Acute on chronic systolic heart failure (HCC)  Shortness of breath   75 y.o. male presents with ongoing leg swelling and shortness of breath worsening in the last week. Has history of MVR remotely and partial gastrectomy. Has gained roughly 10 lbs in 10 days. Was given lasix at urgent care but has been unable to get in touch with his cardiologist to establish follow up. Bedside US and clinical syndrome c/w acute CHF exacerbation. Patient is hypertensive here. BNP dramatically elevated. Pt with chronic  anemia and brother who is a Psychologist, sport and exercise out of state is requesting further anemia workup. Will require admission for formal echo and evaluation of prosthetic mitral valve given decline in status. Hospitalist was consulted for admission and will see the patient in the emergency department.    Primary cardiologist is in [place]: Margaretmary Bayley, MD 05/07/15 [phone]

## 2015-05-07 ENCOUNTER — Encounter (HOSPITAL_COMMUNITY): Payer: Self-pay | Admitting: Physician Assistant

## 2015-05-07 ENCOUNTER — Inpatient Hospital Stay (HOSPITAL_COMMUNITY): Payer: BC Managed Care – PPO

## 2015-05-07 ENCOUNTER — Ambulatory Visit (HOSPITAL_COMMUNITY): Payer: BC Managed Care – PPO

## 2015-05-07 DIAGNOSIS — N183 Chronic kidney disease, stage 3 (moderate): Secondary | ICD-10-CM

## 2015-05-07 DIAGNOSIS — R0600 Dyspnea, unspecified: Secondary | ICD-10-CM

## 2015-05-07 DIAGNOSIS — Z954 Presence of other heart-valve replacement: Secondary | ICD-10-CM

## 2015-05-07 DIAGNOSIS — D649 Anemia, unspecified: Secondary | ICD-10-CM

## 2015-05-07 DIAGNOSIS — I5023 Acute on chronic systolic (congestive) heart failure: Secondary | ICD-10-CM

## 2015-05-07 DIAGNOSIS — I160 Hypertensive urgency: Secondary | ICD-10-CM

## 2015-05-07 LAB — CBC WITH DIFFERENTIAL/PLATELET
BASOS PCT: 1 %
Basophils Absolute: 0 10*3/uL (ref 0.0–0.1)
EOS ABS: 0.5 10*3/uL (ref 0.0–0.7)
Eosinophils Relative: 8 %
HCT: 28.8 % — ABNORMAL LOW (ref 39.0–52.0)
HEMOGLOBIN: 9.4 g/dL — AB (ref 13.0–17.0)
LYMPHS ABS: 0.8 10*3/uL (ref 0.7–4.0)
Lymphocytes Relative: 12 %
MCH: 30.4 pg (ref 26.0–34.0)
MCHC: 32.6 g/dL (ref 30.0–36.0)
MCV: 93.2 fL (ref 78.0–100.0)
MONO ABS: 0.7 10*3/uL (ref 0.1–1.0)
MONOS PCT: 12 %
NEUTROS PCT: 67 %
Neutro Abs: 4.1 10*3/uL (ref 1.7–7.7)
Platelets: 236 10*3/uL (ref 150–400)
RBC: 3.09 MIL/uL — ABNORMAL LOW (ref 4.22–5.81)
RDW: 14 % (ref 11.5–15.5)
WBC: 6.1 10*3/uL (ref 4.0–10.5)

## 2015-05-07 LAB — COMPREHENSIVE METABOLIC PANEL
ALBUMIN: 3 g/dL — AB (ref 3.5–5.0)
ALK PHOS: 77 U/L (ref 38–126)
ALT: 20 U/L (ref 17–63)
AST: 23 U/L (ref 15–41)
Anion gap: 10 (ref 5–15)
BUN: 13 mg/dL (ref 6–20)
CALCIUM: 8.2 mg/dL — AB (ref 8.9–10.3)
CHLORIDE: 104 mmol/L (ref 101–111)
CO2: 27 mmol/L (ref 22–32)
CREATININE: 1.5 mg/dL — AB (ref 0.61–1.24)
GFR calc non Af Amer: 43 mL/min — ABNORMAL LOW (ref >60)
GFR, EST AFRICAN AMERICAN: 50 mL/min — AB (ref >60)
GLUCOSE: 97 mg/dL (ref 65–99)
Potassium: 3.3 mmol/L — ABNORMAL LOW (ref 3.5–5.1)
SODIUM: 141 mmol/L (ref 135–145)
Total Bilirubin: 0.8 mg/dL (ref 0.3–1.2)
Total Protein: 6.2 g/dL — ABNORMAL LOW (ref 6.5–8.1)

## 2015-05-07 LAB — PROTIME-INR
INR: 3.98 — AB (ref 0.00–1.49)
PROTHROMBIN TIME: 37.9 s — AB (ref 11.6–15.2)

## 2015-05-07 LAB — TROPONIN I
TROPONIN I: 0.03 ng/mL (ref <0.031)
TROPONIN I: 0.03 ng/mL (ref <0.031)
TROPONIN I: 0.03 ng/mL (ref <0.031)

## 2015-05-07 LAB — FOLATE: Folate: 50.2 ng/mL (ref >5.9)

## 2015-05-07 MED ORDER — FUROSEMIDE 40 MG PO TABS
40.0000 mg | ORAL_TABLET | Freq: Two times a day (BID) | ORAL | Status: DC
  Administered 2015-05-07: 40 mg via ORAL
  Filled 2015-05-07: qty 1

## 2015-05-07 MED ORDER — POTASSIUM CHLORIDE CRYS ER 20 MEQ PO TBCR
30.0000 meq | EXTENDED_RELEASE_TABLET | ORAL | Status: AC
  Administered 2015-05-08 (×2): 30 meq via ORAL
  Filled 2015-05-07 (×2): qty 1

## 2015-05-07 MED ORDER — POTASSIUM CHLORIDE CRYS ER 20 MEQ PO TBCR
40.0000 meq | EXTENDED_RELEASE_TABLET | ORAL | Status: AC
  Administered 2015-05-07 (×2): 40 meq via ORAL
  Filled 2015-05-07 (×2): qty 2

## 2015-05-07 MED ORDER — CARVEDILOL 6.25 MG PO TABS
6.2500 mg | ORAL_TABLET | Freq: Two times a day (BID) | ORAL | Status: DC
Start: 2015-05-07 — End: 2015-05-13
  Administered 2015-05-07 – 2015-05-13 (×12): 6.25 mg via ORAL
  Filled 2015-05-07 (×12): qty 1

## 2015-05-07 MED ORDER — CYANOCOBALAMIN 1000 MCG/ML IJ SOLN
1000.0000 ug | Freq: Every day | INTRAMUSCULAR | Status: DC
  Administered 2015-05-07 – 2015-05-12 (×6): 1000 ug via INTRAMUSCULAR
  Filled 2015-05-07 (×6): qty 1

## 2015-05-07 MED ORDER — HYDRALAZINE HCL 20 MG/ML IJ SOLN: 10.0000 mg | Freq: Four times a day (QID) | INTRAMUSCULAR | Status: DC | PRN

## 2015-05-07 MED ORDER — WARFARIN - PHARMACIST DOSING INPATIENT: Freq: Every day | Status: DC

## 2015-05-07 NOTE — Progress Notes (Signed)
Received call from [facility] ED [place] of pt CM consult and being admitted CM consult for CHF screen

## 2015-05-07 NOTE — ED Notes (Signed)
A fruit salad ordered for patient for lunch.

## 2015-05-07 NOTE — Progress Notes (Signed)
Echo with ef of 45-50% with normal mechanical valve function. Seems likely acute systolic CHF due to coronary event. Will do LHC Monday. Hold coumadin for cath. Start heparin when INR less than 2.   Cath report in patient chart:  EF 60% LAD 55% Diag 70% Lcx and RCA no disease noted  Madilynne Mullan, PAC

## 2015-05-07 NOTE — Progress Notes (Signed)
Patient Demographics:    Robert [name], is a 75 y.o. male, DOB - 05-11-1940, [id number]  Admit date - 05/06/2015   Admitting Physician No admitting provider for patient encounter.  Outpatient Primary MD for the patient is Robert Cobble, MD  LOS - 1   Chief Complaint  Patient presents with  . Shortness of Breath        Subjective:    [name] today has, No headache, No chest pain, No abdominal pain - No Nausea, No new weakness tingling or numbness, No Cough - Improved SOB.    Assessment  & Plan :     1. Acute on chronic diastolic heart failure. Last EF 60%. History of mechanical mitral valve with therapeutic INR, much improved after Lasix and 2-1/2 L diuresis, no shortness of breath, chest x-ray now clear, placed on beta blocker, on Lasix and Aldactone, cardiology to evaluate, echo pending.  2. 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 INR therapeutic, check TTE.  3. Anemia of chronic disease. Low B-12 as well, placed on B-12 supplementation, outpatient age-appropriate workup by PCP.  4. Low B-12 levels. Placed on replacement.  5. Hypertension. In poor control. On ARB, added Coreg, continue diuresis and monitor. IV hydralazine as needed.  6. BPH. On Flomax continue.    Code Status : Full  Family Communication  : wife  Disposition Plan  : Home 1-2 days  Consults  : Cards  Procedures  :   TTE  DVT Prophylaxis  :  Coumadin Lab Results  Component Value Date   INR 3.98* 05/07/2015   INR 4.37* 05/06/2015     Lab Results  Component Value Date   PLT 236 05/07/2015    Inpatient Medications  Scheduled Meds: . aspirin  81 mg Oral Daily  . atorvastatin  20 mg Oral QHS  . ferrous sulfate  325 mg Oral BID WC  . furosemide  40 mg  Intravenous Q12H  . irbesartan  37.5 mg Oral Daily  . multivitamin with minerals  1 tablet Oral Daily  . nitroGLYCERIN  0.4 mg Transdermal Daily  . sodium chloride flush  3 mL Intravenous Q12H  . spironolactone  25 mg Oral Daily  . tamsulosin  0.4 mg Oral QPC supper  . Warfarin - Pharmacist Dosing Inpatient   Does not apply q1800   Continuous Infusions:  PRN Meds:.acetaminophen **OR** acetaminophen, hydrALAZINE, ondansetron **OR** ondansetron (ZOFRAN) IV  Antibiotics  :     Anti-infectives    None        Objective:   Filed Vitals:   05/07/15 0600 05/07/15 0700 05/07/15 0900 05/07/15 0955  BP: 155/65 146/72 148/77 163/81  Pulse: 70 64 71 62  Temp:      TempSrc:      Resp: 25 21 20 17   Weight:      SpO2: 99% 99% 99% 100%    Wt Readings from Last 3 Encounters:  05/06/15 61.689 kg (136 lb)     Intake/Output Summary (Last 24 hours) at 05/07/15 1011 Last data filed at 05/07/15 0933  Gross per 24 hour  Intake      3 ml  Output   2770 ml  Net  -2767 ml     Physical  Exam  Awake Alert, Oriented X 3, No new F.N deficits, Normal affect [place].AT,PERRAL Supple Neck,No JVD, No cervical lymphadenopathy appriciated.  Symmetrical Chest wall movement, Good air movement bilaterally, CTAB RRR,No Gallops,Rubs , + mitral Murmur, No Parasternal Heave +ve B.Sounds, Abd Soft, No tenderness, No organomegaly appriciated, No rebound - guarding or rigidity. No Cyanosis, Clubbing or edema, No new Rash or bruise      Data Review:   Micro Results No results found for this or any previous visit (from the past 240 hour(s)).  Radiology Reports Dg Chest 2 View  05/07/2015  CLINICAL DATA:  Shortness of breath for 1 week EXAM: CHEST  2 VIEW COMPARISON:  May 06, 2015 FINDINGS: Postoperative changes noted on the right. Patient is status post mitral valve replacement. There are areas of mild scarring in both lower lung zones. There is no edema or consolidation. Heart is borderline enlarged  with pulmonary vascularity within normal limits. No adenopathy. No bone lesions. IMPRESSION: Stable postoperative change and areas of mild scarring in both lower lung zones. No edema or consolidation. Heart borderline enlarged but stable. Electronically Signed   By: Lowella Grip III M.D.   On: 05/07/2015 09:37   Dg Chest 2 View  05/06/2015  CLINICAL DATA:  Shortness of breath, weakness and cough for 2 days, hypertension EXAM: CHEST  2 VIEW COMPARISON:  05/05/2015 FINDINGS: Enlargement of cardiac silhouette post MVR. Mediastinal contours and pulmonary vascularity normal. Atherosclerotic calcification aorta. Emphysematous and minimal bronchitic changes consistent with COPD. Bibasilar small pleural effusions greater on RIGHT. Minimal basilar atelectasis bilaterally. Upper lungs clear. No pneumothorax. Bones demineralized. IMPRESSION: Enlargement of cardiac silhouette post MVR. COPD changes with bibasilar small pleural effusions and basilar atelectasis. When compared to the previous exam, slightly improved bibasilar aeration. Electronically Signed   By: Lavonia Dana M.D.   On: 05/06/2015 14:28     CBC  Recent Labs Lab 05/06/15 1406 05/07/15 0530  WBC 5.2 6.1  HGB 9.4* 9.4*  HCT 29.4* 28.8*  PLT 218 236  MCV 94.5 93.2  MCH 30.2 30.4  MCHC 32.0 32.6  RDW 14.0 14.0  LYMPHSABS  --  0.8  MONOABS  --  0.7  EOSABS  --  0.5  BASOSABS  --  0.0    Chemistries   Recent Labs Lab 05/06/15 1406 05/07/15 0530  NA 142 141  K 4.4 3.3*  CL 108 104  CO2 25 27  GLUCOSE 277* 97  BUN 14 13  CREATININE 1.59* 1.50*  CALCIUM 8.0* 8.2*  AST  --  23  ALT  --  20  ALKPHOS  --  77  BILITOT  --  0.8   ------------------------------------------------------------------------------------------------------------------ No results for input(s): CHOL, HDL, LDLCALC, TRIG, CHOLHDL, LDLDIRECT in the last 72 hours.  Lab Results  Component Value Date   HGBA1C 6.2* 04/17/2006    ------------------------------------------------------------------------------------------------------------------ No results for input(s): TSH, T4TOTAL, T3FREE, THYROIDAB in the last 72 hours.  Invalid input(s): FREET3 ------------------------------------------------------------------------------------------------------------------  Recent Labs  05/06/15 2122  VITAMINB12 114*  FOLATE 50.2  FERRITIN 37  TIBC 372  IRON 32*  RETICCTPCT 2.2    Coagulation profile  Recent Labs Lab 05/06/15 2122 05/07/15 0530  INR 4.37* 3.98*    No results for input(s): DDIMER in the last 72 hours.  Cardiac Enzymes  Recent Labs Lab 05/07/15 0106 05/07/15 0530  TROPONINI 0.03 0.03   ------------------------------------------------------------------------------------------------------------------    Component Value Date/Time   BNP 1406.9* 05/06/2015 1406    Time Spent in minutes  35  Thurnell Lose M.D on 05/07/2015 at 10:11 AM  Between 7am to 7pm - Pager - [phone]  After 7pm go to [email or web address] - password [facility]  [facility] -  Office  [phone]

## 2015-05-07 NOTE — Progress Notes (Signed)
Echocardiogram 2D Echocardiogram has been performed.  Robert [name] 05/07/2015, 12:22 PM

## 2015-05-07 NOTE — ED Notes (Signed)
Cardiology at bedside.

## 2015-05-07 NOTE — Progress Notes (Signed)
ANTICOAGULATION CONSULT NOTE - Initial Consult  Pharmacy Consult for Coumadin Indication: MVR  Allergies  Allergen Reactions  . Penicillins Other (See Comments)    Patient Measurements: Weight: 136 lb (61.689 kg)  Vital Signs: Temp: 97.9 F (36.6 C) (02/23 1409) Temp Source: Oral (02/23 1409) BP: 138/68 mmHg (02/24 0015) Pulse Rate: 71 (02/24 0015)  Labs:  Recent Labs  05/06/15 1406 05/06/15 2122  HGB 9.4*  --   HCT 29.4*  --   PLT 218  --   LABPROT  --  40.6*  INR  --  4.37*  CREATININE 1.59*  --      Medical History: Past Medical History  Diagnosis Date  . Hypertension   . High cholesterol   . Gastric ulcer      Assessment: 75yo male c/o SOB x1wk and worsening BLE swelling, to continue Coumadin for MVR during admission; current INR above goal, last dose of Coumadin taken 2/22.  Goal of Therapy:  INR 2.5-3.5 (verified w/ pt)   Plan:  Will hold Coumadin for now and monitor INR for dose adjustments.  Wynona Neat, PharmD, BCPS  05/07/2015,12:24 AM

## 2015-05-07 NOTE — ED Notes (Signed)
Cardiology PA at bedside. 

## 2015-05-07 NOTE — ED Notes (Signed)
Food tray given to patient 

## 2015-05-07 NOTE — Consult Note (Signed)
CARDIOLOGY CONSULT NOTE   Patient ID: Robert [name] MRN: [id number] DOB/AGE: May 19, 1940 75 y.o.  Admit date: 05/06/2015  Primary Physician   Robert Cobble, MD Primary Cardiologist   Robert [name], w/ [facility] MDs Reason for Consultation   CHF  [id number] Robert [name] is a 75 y.o. year old male with a history of tricuspid mechanical valve, mitral mechanical valve, chronic coumadin, HTN, HLD, BPH, CKD III.  Seen 02/21 by Robert [name] for SOB, dx CHF and iron deficiency anemia 2nd chronic blood loss, started on Lasix. Since did not improve, so he went to urgent care on 02/22. When symptoms did not improve last p.m., he came to [facility] ER. He has gotten Lasix 40 mg 2 doses and his breathing is still short, but improved.  Robert [name] symptoms began fairly suddenly about 2 weeks ago. He was fine and then began noticing increasing dyspnea on exertion. He describes orthopnea and probable PND. His symptoms had worsened when he went to the doctor to the point that he could not do hardly anything without feeling short of breath. He could not lie flat to sleep and in fact was getting very little rest. His normal weight is 125 pounds and at his office visit, he was 136. Since he got the IV Lasix, he is breathing much better, but not back to baseline.  He is aware that he is anemic. He is a vegetarian. He has not had black or tarry stools. He is compliant with his medications. He has not had any chest pain. He has not had shortness of breath like this since his valve was replaced.    Past Medical History  Diagnosis Date  . Hypertension   . High cholesterol   . Chronic anticoagulation     coumadin for mechanical valves  . BPH (benign prostatic hyperplasia)   . CKD (chronic kidney disease), stage III   . Gastric cancer ([place]) 2005    s/p gastrectomy  . Rheumatic heart disease   . HOH (hard of hearing)   . Diabetes mellitus type 2, diet-controlled ([place])      Past Surgical History  Procedure  Laterality Date  . Mitral valve replacement  10/24/2013    64mm StJude mechanical valve  . Partial gastrectomy  2005  . Hernia repair      Allergies  Allergen Reactions  . Penicillins Other (See Comments)  . Vancomycin Other (See Comments)    Kidney problems    I have reviewed the patient's current medications . aspirin  81 mg Oral Daily  . atorvastatin  20 mg Oral QHS  . carvedilol  6.25 mg Oral BID WC  . cyanocobalamin  1,000 mcg Intramuscular Daily  . ferrous sulfate  325 mg Oral BID WC  . furosemide  40 mg Oral BID  . irbesartan  37.5 mg Oral Daily  . multivitamin with minerals  1 tablet Oral Daily  . nitroGLYCERIN  0.4 mg Transdermal Daily  . potassium chloride  40 mEq Oral Q4H  . spironolactone  25 mg Oral Daily  . tamsulosin  0.4 mg Oral QPC supper  . Warfarin - Pharmacist Dosing Inpatient   Does not apply q1800     acetaminophen **OR** [DISCONTINUED] acetaminophen, hydrALAZINE, [DISCONTINUED] ondansetron **OR** ondansetron (ZOFRAN) IV  Prior to Admission medications   Medication Sig Start Date End Date Taking? Authorizing Provider  aspirin 81 MG chewable tablet Chew 81 mg by mouth daily.   Yes Historical Provider, MD  atorvastatin (LIPITOR)  20 MG tablet Take 20 mg by mouth at bedtime.   Yes Historical Provider, MD  ferrous sulfate 325 (65 FE) MG tablet Take 325 mg by mouth 2 (two) times daily with a meal.   Yes Historical Provider, MD  furosemide (LASIX) 40 MG tablet Take 40 mg by mouth daily.   Yes Historical Provider, MD  Multiple Vitamin (MULTIVITAMIN WITH MINERALS) TABS tablet Take 1 tablet by mouth daily.   Yes Historical Provider, MD  spironolactone (ALDACTONE) 25 MG tablet Take 25 mg by mouth daily.   Yes Historical Provider, MD  tamsulosin (FLOMAX) 0.4 MG CAPS capsule Take 0.4 mg by mouth daily after supper.   Yes Historical Provider, MD  valsartan (DIOVAN) 80 MG tablet Take 80 mg by mouth daily.   Yes Historical Provider, MD  warfarin (COUMADIN) 3 MG tablet   Take 1 and 1/2 tablets every day except take 1 tablet on Sunday, Tuesday and Friday   Yes Historical Provider, MD     Social History   Social History  . Marital Status: Married    Spouse Name: N/A  . Number of Children: N/A  . Years of Education: N/A   Occupational History  . Mathematics professor at [employer] History Main Topics  . Smoking status: Never Smoker   . Smokeless tobacco: Never Used  . Alcohol Use: No  . Drug Use: No  . Sexual Activity: Not on file   Other Topics Concern  . Not on file   Social History Narrative   Lives in [place] with his wife.    Family Status  Relation Status Death Age  . Mother Deceased   . Father Deceased    Family History  Problem Relation Age of Onset  . Hypertension Mother   . Diabetes Mother   . Cancer Brother      ROS:  Full 14 point review of systems complete and found to be negative unless listed above.  Physical Exam: Blood pressure 161/80, pulse 68, temperature 97.9 F (36.6 C), temperature source Oral, resp. rate 16, weight 136 lb (61.689 kg), SpO2 100 %.  General: Well developed, well nourished, male in no acute distress Head: Eyes PERRLA, No xanthomas.   Normocephalic and atraumatic, oropharynx without edema or exudate. Dentition: poor Lungs: decreased BS bases, with some rales Heart: HRRR S1 S2, no rub/gallop, crisp valve click, no sig murmur. pulses are 2+ all 4 extrem.   Neck: No carotid bruits. No lymphadenopathy.  JVD 10 cm, + hepatojugular reflux. Abdomen: Bowel sounds present, abdomen soft and non-tender without masses or hernias noted. Msk:  No spine or cva tenderness. No weakness, no joint deformities or effusions. Extremities: No clubbing or cyanosis. No edema.  Neuro: Alert and oriented X 3. No focal deficits noted. Psych:  Good affect, responds appropriately Skin: No rashes or lesions noted.  Labs:   Lab Results  Component Value Date   WBC 6.1 05/07/2015   HGB 9.4* 05/07/2015   HCT  28.8* 05/07/2015   MCV 93.2 05/07/2015   PLT 236 05/07/2015    Recent Labs  05/07/15 0530  INR 3.98*     Recent Labs Lab 05/07/15 0530  NA 141  K 3.3*  CL 104  CO2 27  BUN 13  CREATININE 1.50*  CALCIUM 8.2*  PROT 6.2*  BILITOT 0.8  ALKPHOS 77  ALT 20  AST 23  GLUCOSE 97  ALBUMIN 3.0*   No results found for: MG  Recent Labs  05/07/15  0106 05/07/15 0530  TROPONINI 0.03 0.03    Recent Labs  05/06/15 1415  TROPIPOC 0.02   B NATRIURETIC PEPTIDE  Date/Time Value Ref Range Status  05/06/2015 02:06 PM 1406.9* 0.0 - 100.0 pg/mL Final   VITAMIN B-12  Date/Time Value Ref Range Status  05/06/2015 09:22 PM 114* 180 - 914 pg/mL Final   FOLATE  Date/Time Value Ref Range Status  05/06/2015 09:22 PM 50.2 >5.9 ng/mL Final    Comment:    RESULTS CONFIRMED BY MANUAL DILUTION   FERRITIN  Date/Time Value Ref Range Status  05/06/2015 09:22 PM 37 24 - 336 ng/mL Final   TIBC  Date/Time Value Ref Range Status  05/06/2015 09:22 PM 372 250 - 450 ug/dL Final   IRON  Date/Time Value Ref Range Status  05/06/2015 09:22 PM 32* 45 - 182 ug/dL Final   RETIC CT PCT  Date/Time Value Ref Range Status  05/06/2015 09:22 PM 2.2 0.4 - 3.1 % Final    Echo: ordered  ECG:  05/06/2015 SR, ?LVH HR 78 Q waves I, AVL, V1, V2  CATH: 10/03/2013 EF 60% LAD 55% Diag 70% Lcx and RCA no disease noted   Radiology:  Dg Chest 2 View 05/07/2015  CLINICAL DATA:  Shortness of breath for 1 week EXAM: CHEST  2 VIEW COMPARISON:  May 06, 2015 FINDINGS: Postoperative changes noted on the right. Patient is status post mitral valve replacement. There are areas of mild scarring in both lower lung zones. There is no edema or consolidation. Heart is borderline enlarged with pulmonary vascularity within normal limits. No adenopathy. No bone lesions. IMPRESSION: Stable postoperative change and areas of mild scarring in both lower lung zones. No edema or consolidation. Heart borderline enlarged  but stable. Electronically Signed   By: Lowella Grip III M.Robert.   On: 05/07/2015 09:37   Dg Chest 2 View 05/06/2015  CLINICAL DATA:  Shortness of breath, weakness and cough for 2 days, hypertension EXAM: CHEST  2 VIEW COMPARISON:  05/05/2015 FINDINGS: Enlargement of cardiac silhouette post MVR. Mediastinal contours and pulmonary vascularity normal. Atherosclerotic calcification aorta. Emphysematous and minimal bronchitic changes consistent with COPD. Bibasilar small pleural effusions greater on RIGHT. Minimal basilar atelectasis bilaterally. Upper lungs clear. No pneumothorax. Bones demineralized. IMPRESSION: Enlargement of cardiac silhouette post MVR. COPD changes with bibasilar small pleural effusions and basilar atelectasis. When compared to the previous exam, slightly improved bibasilar aeration. Electronically Signed   By: Lavonia Dana M.Robert.   On: 05/06/2015 14:28    ASSESSMENT AND PLAN:   The patient was seen today by Robert Debara Pickett, the patient evaluated and the data reviewed.  Principal Problem: 1.  Acute on chronic systolic (congestive) heart failure (HCC) - Think he may have diastolic CHF, echo ordered - still with some volume overload by exam and by symptoms - would continue IV Lasix for now, possible change to PO in am - dry weight is 125 lbs - cycle ez to make sure CAD has not progressed.  2. Hypokalemia - to get 80 meq today  3. HTN, Hypertensive urgency - on Aldactone 25 mg (home dose) - Coreg 6.25 and irbesartan are new  Active Problems:   CKD (chronic kidney disease) stage 3, GFR 30-59 ml/min   Chronic anemia   H/O mitral valve replacement with mechanical valve   CHF (congestive heart failure) ([place])   SignedRosaria Ferries, PA-C 05/07/2015 10:46 AM Beeper [id number]  Co-Sign MD

## 2015-05-07 NOTE — ED Notes (Signed)
Doctor at bedside.

## 2015-05-07 NOTE — Progress Notes (Signed)
Patient having muscle cramps in the left thigh area. Mustard packets given and patient had some relief. Potassium result is 3.3. Notified on-call hospitalist via text page. Awaiting return page and or orders. Will continue to monitor patient to end of shift.

## 2015-05-07 NOTE — ED Notes (Signed)
Patient was ordered a fruit tray and milk.

## 2015-05-07 NOTE — ED Notes (Signed)
Patient transported to X-ray 

## 2015-05-08 ENCOUNTER — Encounter (HOSPITAL_COMMUNITY): Payer: Self-pay | Admitting: Nurse Practitioner

## 2015-05-08 LAB — BASIC METABOLIC PANEL
Anion gap: 8 (ref 5–15)
BUN: 16 mg/dL (ref 6–20)
CALCIUM: 7.7 mg/dL — AB (ref 8.9–10.3)
CHLORIDE: 108 mmol/L (ref 101–111)
CO2: 27 mmol/L (ref 22–32)
CREATININE: 1.61 mg/dL — AB (ref 0.61–1.24)
GFR calc Af Amer: 46 mL/min — ABNORMAL LOW (ref >60)
GFR calc non Af Amer: 40 mL/min — ABNORMAL LOW (ref >60)
GLUCOSE: 108 mg/dL — AB (ref 65–99)
Potassium: 4.8 mmol/L (ref 3.5–5.1)
Sodium: 143 mmol/L (ref 135–145)

## 2015-05-08 LAB — PROTIME-INR
INR: 2.86 — ABNORMAL HIGH (ref 0.00–1.49)
Prothrombin Time: 29.5 seconds — ABNORMAL HIGH (ref 11.6–15.2)

## 2015-05-08 LAB — HEMOGLOBIN A1C
HEMOGLOBIN A1C: 5.7 % — AB (ref 4.8–5.6)
Mean Plasma Glucose: 117 mg/dL

## 2015-05-08 LAB — MAGNESIUM: MAGNESIUM: 2.1 mg/dL (ref 1.7–2.4)

## 2015-05-08 MED ORDER — FUROSEMIDE 40 MG PO TABS
40.0000 mg | ORAL_TABLET | Freq: Two times a day (BID) | ORAL | Status: DC
  Administered 2015-05-09: 40 mg via ORAL
  Filled 2015-05-08: qty 1

## 2015-05-08 MED ORDER — SPIRONOLACTONE 25 MG PO TABS
25.0000 mg | ORAL_TABLET | Freq: Every day | ORAL | Status: DC
  Administered 2015-05-09: 25 mg via ORAL
  Filled 2015-05-08 (×2): qty 1

## 2015-05-08 MED ORDER — IRBESARTAN 75 MG PO TABS: 37.5000 mg | ORAL_TABLET | Freq: Every day | ORAL | Status: DC

## 2015-05-08 MED ORDER — IRBESARTAN 75 MG PO TABS
37.5000 mg | ORAL_TABLET | Freq: Every day | ORAL | Status: DC
Start: 2015-05-08 — End: 2015-05-10
  Administered 2015-05-08 – 2015-05-09 (×2): 37.5 mg via ORAL
  Filled 2015-05-08 (×3): qty 0.5

## 2015-05-08 MED ORDER — FUROSEMIDE 40 MG PO TABS
40.0000 mg | ORAL_TABLET | Freq: Every day | ORAL | Status: DC
  Administered 2015-05-08: 40 mg via ORAL
  Filled 2015-05-08: qty 1

## 2015-05-08 NOTE — Progress Notes (Signed)
Patient Demographics:    Robert [name], is a 75 y.o. male, DOB - 16-Feb-1941, [id number]  Admit date - 05/06/2015   Admitting Physician Rise Patience, MD  Outpatient Primary MD for the patient is Robert Cobble, MD  LOS - 2   Chief Complaint  Patient presents with  . Shortness of Breath        Subjective:    [name] today has, No headache, No chest pain, No abdominal pain - No Nausea, No new weakness tingling or numbness, No Cough - Improved SOB.    Assessment  & Plan :     1. Acute on chronic combined systolic and diastolic heart failure. Last EF 60% but has now dropped to 40% with some wall motion abnormality. History of mechanical mitral valve with therapeutic INR, much improved after Lasix and 3 L diuresis, no shortness of breath, chest x-ray now clear, placed on beta blocker, Lasix and Aldactone, cardiology following, echo findings suspicious for underlying CAD, due for left heart catheterization on 05/10/2015.  2. 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 INR therapeutic, nonacute TTE.  3. Anemia of chronic disease. Low B-12 as well, placed on B-12 supplementation, outpatient age-appropriate workup by PCP.  4. Low B-12 levels. Placed on replacement.  5. Hypertension.  On ARB, added Coreg, continue diuresis and monitor. IV hydralazine as needed.  6. BPH. On Flomax continue.  7. Dyslipidemia. On statin continue.    Code Status : Full  Family Communication  : wife  Disposition Plan  : Home 1-2 days  Consults  : Cards  Procedures  :   TTE  LVEF 45-50%, inferior hypokinesis, trileaflet sclerotic aorticvalve with mild to moderate regurgitation, tilting bileafletmechanical AVR without obstruction and normal bileaflet motion, mild TR,  RVSP 38 mmHG + RAP.   DVT Prophylaxis  :  Coumadin Lab Results  Component Value Date   INR 2.86* 05/08/2015   INR 3.98* 05/07/2015   INR 4.37* 05/06/2015     Lab Results  Component Value Date   PLT 236 05/07/2015    Inpatient Medications  Scheduled Meds: . aspirin  81 mg Oral Daily  . atorvastatin  20 mg Oral QHS  . carvedilol  6.25 mg Oral BID WC  . cyanocobalamin  1,000 mcg Intramuscular Daily  . ferrous sulfate  325 mg Oral BID WC  . furosemide  40 mg Oral BID  . irbesartan  37.5 mg Oral Daily  . multivitamin with minerals  1 tablet Oral Daily  . nitroGLYCERIN  0.4 mg Transdermal Daily  . [START ON 05/09/2015] spironolactone  25 mg Oral Daily  . tamsulosin  0.4 mg Oral QPC supper   Continuous Infusions:  PRN Meds:.acetaminophen **OR** [DISCONTINUED] acetaminophen, hydrALAZINE, [DISCONTINUED] ondansetron **OR** ondansetron (ZOFRAN) IV  Antibiotics  :     Anti-infectives    None        Objective:   Filed Vitals:   05/07/15 1508 05/07/15 2019 05/08/15 0400 05/08/15 0900  BP: 139/65 143/83 114/54 112/60  Pulse: 62 67 67 66  Temp: 97.6 F (36.4 C) 98.3 F (36.8 C) 98.2 F (36.8 C)   TempSrc: Oral Oral Oral   Resp: 18 18 18    Height:  Weight:   54.069 kg (119 lb 3.2 oz)   SpO2: 97% 98% 98% 98%    Wt Readings from Last 3 Encounters:  05/08/15 54.069 kg (119 lb 3.2 oz)     Intake/Output Summary (Last 24 hours) at 05/08/15 1008 Last data filed at 05/08/15 0618  Gross per 24 hour  Intake    480 ml  Output    800 ml  Net   -320 ml     Physical Exam  Awake Alert, Oriented X 3, No new F.N deficits, Normal affect [place].AT,PERRAL Supple Neck,No JVD, No cervical lymphadenopathy appriciated.  Symmetrical Chest wall movement, Good air movement bilaterally, CTAB RRR,No Gallops,Rubs , + mitral Murmur, No Parasternal Heave +ve B.Sounds, Abd Soft, No tenderness, No organomegaly appriciated, No rebound - guarding or rigidity. No Cyanosis, Clubbing or  edema, No new Rash or bruise      Data Review:   Micro Results No results found for this or any previous visit (from the past 240 hour(s)).  Radiology Reports Dg Chest 2 View  05/07/2015  CLINICAL DATA:  Shortness of breath for 1 week EXAM: CHEST  2 VIEW COMPARISON:  May 06, 2015 FINDINGS: Postoperative changes noted on the right. Patient is status post mitral valve replacement. There are areas of mild scarring in both lower lung zones. There is no edema or consolidation. Heart is borderline enlarged with pulmonary vascularity within normal limits. No adenopathy. No bone lesions. IMPRESSION: Stable postoperative change and areas of mild scarring in both lower lung zones. No edema or consolidation. Heart borderline enlarged but stable. Electronically Signed   By: Lowella Grip III M.D.   On: 05/07/2015 09:37   Dg Chest 2 View  05/06/2015  CLINICAL DATA:  Shortness of breath, weakness and cough for 2 days, hypertension EXAM: CHEST  2 VIEW COMPARISON:  05/05/2015 FINDINGS: Enlargement of cardiac silhouette post MVR. Mediastinal contours and pulmonary vascularity normal. Atherosclerotic calcification aorta. Emphysematous and minimal bronchitic changes consistent with COPD. Bibasilar small pleural effusions greater on RIGHT. Minimal basilar atelectasis bilaterally. Upper lungs clear. No pneumothorax. Bones demineralized. IMPRESSION: Enlargement of cardiac silhouette post MVR. COPD changes with bibasilar small pleural effusions and basilar atelectasis. When compared to the previous exam, slightly improved bibasilar aeration. Electronically Signed   By: Lavonia Dana M.D.   On: 05/06/2015 14:28     CBC  Recent Labs Lab 05/06/15 1406 05/07/15 0530  WBC 5.2 6.1  HGB 9.4* 9.4*  HCT 29.4* 28.8*  PLT 218 236  MCV 94.5 93.2  MCH 30.2 30.4  MCHC 32.0 32.6  RDW 14.0 14.0  LYMPHSABS  --  0.8  MONOABS  --  0.7  EOSABS  --  0.5  BASOSABS  --  0.0    Chemistries   Recent Labs Lab  05/06/15 1406 05/07/15 0530 05/08/15 0324  NA 142 141 143  K 4.4 3.3* 4.8  CL 108 104 108  CO2 25 27 27   GLUCOSE 277* 97 108*  BUN 14 13 16   CREATININE 1.59* 1.50* 1.61*  CALCIUM 8.0* 8.2* 7.7*  MG  --   --  2.1  AST  --  23  --   ALT  --  20  --   ALKPHOS  --  77  --   BILITOT  --  0.8  --    ------------------------------------------------------------------------------------------------------------------ No results for input(s): CHOL, HDL, LDLCALC, TRIG, CHOLHDL, LDLDIRECT in the last 72 hours.  Lab Results  Component Value Date   HGBA1C 5.7* 05/07/2015   ------------------------------------------------------------------------------------------------------------------  No results for input(s): TSH, T4TOTAL, T3FREE, THYROIDAB in the last 72 hours.  Invalid input(s): FREET3 ------------------------------------------------------------------------------------------------------------------  Recent Labs  05/06/15 2122  VITAMINB12 114*  FOLATE 50.2  FERRITIN 37  TIBC 372  IRON 32*  RETICCTPCT 2.2    Coagulation profile  Recent Labs Lab 05/06/15 2122 05/07/15 0530 05/08/15 0324  INR 4.37* 3.98* 2.86*    No results for input(s): DDIMER in the last 72 hours.  Cardiac Enzymes  Recent Labs Lab 05/07/15 0106 05/07/15 0530 05/07/15 1232  TROPONINI 0.03 0.03 0.03   ------------------------------------------------------------------------------------------------------------------    Component Value Date/Time   BNP 1406.9* 05/06/2015 1406    Time Spent in minutes  35   Bellany Elbaum K M.D on 05/08/2015 at 10:08 AM  Between 7am to 7pm - Pager - [phone]  After 7pm go to [email or web address] - password [facility]  [facility] -  Office  [phone]

## 2015-05-08 NOTE — Progress Notes (Signed)
ANTICOAGULATION CONSULT NOTE - Initial Consult  Pharmacy Consult for Coumadin Indication: MVR  Allergies  Allergen Reactions  . Penicillins Other (See Comments)  . Vancomycin Other (See Comments)    Kidney problems    Patient Measurements: Height: 5\' 6"  (167.6 cm) Weight: 119 lb 3.2 oz (54.069 kg) (scale a) IBW/kg (Calculated) : 63.8  Vital Signs: Temp: 98.2 F (36.8 C) (02/25 0400) Temp Source: Oral (02/25 0400) BP: 114/54 mmHg (02/25 0400) Pulse Rate: 67 (02/25 0400)  Labs:  Recent Labs  05/06/15 1406 05/06/15 2122 05/07/15 0106 05/07/15 0530 05/07/15 1232 05/08/15 0324  HGB 9.4*  --   --  9.4*  --   --   HCT 29.4*  --   --  28.8*  --   --   PLT 218  --   --  236  --   --   LABPROT  --  40.6*  --  37.9*  --  29.5*  INR  --  4.37*  --  3.98*  --  2.86*  CREATININE 1.59*  --   --  1.50*  --  1.61*  TROPONINI  --   --  0.03 0.03 0.03  --      Medical History: Past Medical History  Diagnosis Date  . Hypertension   . High cholesterol   . Chronic anticoagulation     coumadin for mechanical valves  . BPH (benign prostatic hyperplasia)   . CKD (chronic kidney disease), stage III   . Gastric cancer ([place]) 2005    s/p gastrectomy  . Rheumatic heart disease   . HOH (hard of hearing)   . Diabetes mellitus type 2, diet-controlled ([place])   . S/P MVR (mitral valve replacement) 2015    28mm StJude mechanical valve     Assessment: 75yo male c/o SOB x1wk and worsening BLE swelling on Coumadin for MVR; plan for [facility] Monday 2/27 with plans to hold coumadin for cath. Start heparin when INR below goal.  Current INR 2.86, last dose of Coumadin taken 2/22.  Goal of Therapy:  INR 2.5-3.5 (verified w/ pt)   Plan:  Hold Coumadin Start Heparin when INR < 2.5 Daily INR F/u plans for [facility]   Melburn Popper, PharmD Clinical Pharmacy Resident Pager: [phone] 05/08/2015 8:16 AM

## 2015-05-09 LAB — PROTIME-INR
INR: 2.07 — ABNORMAL HIGH (ref 0.00–1.49)
PROTHROMBIN TIME: 23.2 s — AB (ref 11.6–15.2)

## 2015-05-09 LAB — BASIC METABOLIC PANEL
Anion gap: 7 (ref 5–15)
BUN: 19 mg/dL (ref 6–20)
CALCIUM: 7.9 mg/dL — AB (ref 8.9–10.3)
CO2: 28 mmol/L (ref 22–32)
CREATININE: 1.85 mg/dL — AB (ref 0.61–1.24)
Chloride: 106 mmol/L (ref 101–111)
GFR calc Af Amer: 39 mL/min — ABNORMAL LOW (ref >60)
GFR calc non Af Amer: 34 mL/min — ABNORMAL LOW (ref >60)
GLUCOSE: 103 mg/dL — AB (ref 65–99)
Potassium: 4.3 mmol/L (ref 3.5–5.1)
Sodium: 141 mmol/L (ref 135–145)

## 2015-05-09 LAB — HEPARIN LEVEL (UNFRACTIONATED): Heparin Unfractionated: <0.1 IU/mL — ABNORMAL LOW (ref 0.30–0.70)

## 2015-05-09 MED ORDER — ASPIRIN 81 MG PO CHEW
81.0000 mg | CHEWABLE_TABLET | Freq: Every day | ORAL | Status: DC
  Administered 2015-05-11 – 2015-05-13 (×3): 81 mg via ORAL
  Filled 2015-05-09 (×3): qty 1

## 2015-05-09 MED ORDER — SODIUM CHLORIDE 0.9 % IV SOLN: 250.0000 mL | INTRAVENOUS | Status: DC | PRN

## 2015-05-09 MED ORDER — HEPARIN (PORCINE) IN NACL 100-0.45 UNIT/ML-% IJ SOLN
1200.0000 [IU]/h | INTRAMUSCULAR | Status: DC
  Administered 2015-05-09: 850 [IU]/h via INTRAVENOUS
  Filled 2015-05-09: qty 250

## 2015-05-09 MED ORDER — SODIUM CHLORIDE 0.9% FLUSH
3.0000 mL | Freq: Two times a day (BID) | INTRAVENOUS | Status: DC
  Administered 2015-05-10: 3 mL via INTRAVENOUS

## 2015-05-09 MED ORDER — SODIUM CHLORIDE 0.9 % IV SOLN
INTRAVENOUS | Status: DC
  Administered 2015-05-10 (×2): via INTRAVENOUS

## 2015-05-09 MED ORDER — SODIUM CHLORIDE 0.9% FLUSH: 3.0000 mL | INTRAVENOUS | Status: DC | PRN

## 2015-05-09 MED ORDER — ISOSORBIDE MONONITRATE ER 30 MG PO TB24
30.0000 mg | ORAL_TABLET | Freq: Every day | ORAL | Status: DC
  Administered 2015-05-09 – 2015-05-13 (×5): 30 mg via ORAL
  Filled 2015-05-09 (×5): qty 1

## 2015-05-09 MED ORDER — ASPIRIN 81 MG PO CHEW
81.0000 mg | CHEWABLE_TABLET | ORAL | Status: AC
  Administered 2015-05-10: 81 mg via ORAL
  Filled 2015-05-09: qty 1

## 2015-05-09 MED ORDER — FUROSEMIDE 40 MG PO TABS
40.0000 mg | ORAL_TABLET | Freq: Every day | ORAL | Status: DC
  Filled 2015-05-09: qty 1

## 2015-05-09 NOTE — Progress Notes (Addendum)
ANTICOAGULATION CONSULT NOTE - Initial Consult  Pharmacy Consult for Coumadin/Heparin Indication: MVR  Allergies  Allergen Reactions  . Penicillins Other (See Comments)  . Vancomycin Other (See Comments)    Kidney problems    Patient Measurements: Height: 5\' 6"  (167.6 cm) Weight: 118 lb 12.8 oz (53.887 kg) (scale a) IBW/kg (Calculated) : 63.8  Vital Signs: Temp: 97.5 F (36.4 C) (02/26 0527) Temp Source: Oral (02/26 0527) BP: 136/89 mmHg (02/26 0527) Pulse Rate: 66 (02/26 0527)  Labs:  Recent Labs  05/06/15 1406  05/07/15 0106 05/07/15 0530 05/07/15 1232 05/08/15 0324 05/09/15 0335  HGB 9.4*  --   --  9.4*  --   --   --   HCT 29.4*  --   --  28.8*  --   --   --   PLT 218  --   --  236  --   --   --   LABPROT  --   < >  --  37.9*  --  29.5* 23.2*  INR  --   < >  --  3.98*  --  2.86* 2.07*  CREATININE 1.59*  --   --  1.50*  --  1.61* 1.85*  TROPONINI  --   --  0.03 0.03 0.03  --   --   < > = values in this interval not displayed.   Medical History: Past Medical History  Diagnosis Date  . Hypertension   . High cholesterol   . Chronic anticoagulation     coumadin for mechanical valves  . BPH (benign prostatic hyperplasia)   . CKD (chronic kidney disease), stage III   . Gastric cancer ([place]) 2005    s/p gastrectomy  . Rheumatic heart disease   . HOH (hard of hearing)   . Diabetes mellitus type 2, diet-controlled ([place])   . S/P MVR (mitral valve replacement) 2015    63mm StJude mechanical valve     Assessment: 75yo male c/o SOB x1wk and worsening BLE swelling on Coumadin for MVR; plan for [facility] Monday 2/27 with plans to hold coumadin for cath. To start heparin when INR below goal.  Current INR 2.07, last dose of Coumadin taken 2/22.  Heparin dosing weight: 54.7  Goal of Therapy:  INR 2.5-3.5 (verified w/ pt)   Plan:  1) Hold Coumadin 2) Start Heparin without bolus due to elevated INR at 850 units/hr 3) 6 hour HL and daily CBC, HL 4) F/u plans for  [facility]   Melburn Popper, PharmD Clinical Pharmacy Resident Pager: [phone] 05/09/2015 9:17 AM

## 2015-05-09 NOTE — Progress Notes (Signed)
Patient Demographics:    Robert [name], is a 75 y.o. male, DOB - 14-Oct-1940, [id number]  Admit date - 05/06/2015   Admitting Physician Rise Patience, MD  Outpatient Primary MD for the patient is [name], MD  LOS - 3   Chief Complaint  Patient presents with  . Shortness of Breath        Subjective:    Robert [name] today has, No headache, No chest pain, No abdominal pain - No Nausea, No new weakness tingling or numbness, No Cough - Improved SOB.    Assessment  & Plan :     1. Acute on chronic combined systolic and diastolic heart failure. Last EF 60% but has now dropped to 40% with some wall motion abnormality. History of mechanical mitral valve with therapeutic INR, much improved after Lasix and 3.7 L -ve, no shortness of breath, chest x-ray now clear, placed on beta blocker, Lasix change to QD PO on 05-09-15, on Aldactone, cardiology following, echo findings suspicious for underlying CAD, due for left heart catheterization on 05/10/2015.  2. 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 INR therapeutic now on Hep bridge per Cards for L Heart Cath, nonacute TTE.  3. Anemia of chronic disease. Low B-12 as well, placed on B-12 supplementation, outpatient age-appropriate workup by PCP.  4. Low B-12 levels. Placed on replacement.  5. Hypertension.  On ARB, added Coreg, continue diuresis and monitor. IV hydralazine as needed.  6. BPH. On Flomax continue.  7. Dyslipidemia. On statin continue.  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    Code Status : Full  Family Communication  : wife  Disposition Plan  : Home 1-2 days  Consults  : Cards  Procedures  :   TTE  LVEF 45-50%, inferior hypokinesis, trileaflet sclerotic aorticvalve with  mild to moderate regurgitation, tilting bileafletmechanical AVR without obstruction and normal bileaflet motion, mild TR, RVSP 38 mmHG + RAP.   DVT Prophylaxis  :  Coumadin Lab Results  Component Value Date   INR 2.07* 05/09/2015   INR 2.86* 05/08/2015   INR 3.98* 05/07/2015     Lab Results  Component Value Date   PLT 236 05/07/2015    Inpatient Medications  Scheduled Meds: . aspirin  81 mg Oral Daily  . atorvastatin  20 mg Oral QHS  . carvedilol  6.25 mg Oral BID WC  . cyanocobalamin  1,000 mcg Intramuscular Daily  . ferrous sulfate  325 mg Oral BID WC  . furosemide  40 mg Oral BID  . irbesartan  37.5 mg Oral Daily  . isosorbide mononitrate  30 mg Oral Daily  . multivitamin with minerals  1 tablet Oral Daily  . spironolactone  25 mg Oral Daily  . tamsulosin  0.4 mg Oral QPC supper   Continuous Infusions: . heparin     PRN Meds:.acetaminophen **OR** [DISCONTINUED] acetaminophen, hydrALAZINE, [DISCONTINUED] ondansetron **OR** ondansetron (ZOFRAN) IV  Antibiotics  :     Anti-infectives    None        Objective:   Filed Vitals:   05/08/15 0900 05/08/15 1300 05/08/15 1942 05/09/15 0527  BP: 112/60 110/54 149/60 136/89  Pulse: 66 66 68 66  Temp:  97.4  F (36.3 C) 99.6 F (37.6 C) 97.5 F (36.4 C)  TempSrc:  Oral Oral Oral  Resp:  18 18 18   Height:      Weight:    53.887 kg (118 lb 12.8 oz)  SpO2: 98% 99% 99% 98%    Wt Readings from Last 3 Encounters:  05/09/15 53.887 kg (118 lb 12.8 oz)     Intake/Output Summary (Last 24 hours) at 05/09/15 0929 Last data filed at 05/09/15 0500  Gross per 24 hour  Intake    120 ml  Output    775 ml  Net   -655 ml     Physical Exam  Awake Alert, Oriented X 3, No new F.N deficits, Normal affect [place].AT,PERRAL Supple Neck,No JVD, No cervical lymphadenopathy appriciated.  Symmetrical Chest wall movement, Good air movement bilaterally, CTAB RRR,No Gallops,Rubs , + mitral Murmur, No Parasternal Heave +ve B.Sounds,  Abd Soft, No tenderness, No organomegaly appriciated, No rebound - guarding or rigidity. No Cyanosis, Clubbing or edema, No new Rash or bruise      Data Review:   Micro Results No results found for this or any previous visit (from the past 240 hour(s)).  Radiology Reports Dg Chest 2 View  05/07/2015  CLINICAL DATA:  Shortness of breath for 1 week EXAM: CHEST  2 VIEW COMPARISON:  May 06, 2015 FINDINGS: Postoperative changes noted on the right. Patient is status post mitral valve replacement. There are areas of mild scarring in both lower lung zones. There is no edema or consolidation. Heart is borderline enlarged with pulmonary vascularity within normal limits. No adenopathy. No bone lesions. IMPRESSION: Stable postoperative change and areas of mild scarring in both lower lung zones. No edema or consolidation. Heart borderline enlarged but stable. Electronically Signed   By: Lowella Grip III M.D.   On: 05/07/2015 09:37   Dg Chest 2 View  05/06/2015  CLINICAL DATA:  Shortness of breath, weakness and cough for 2 days, hypertension EXAM: CHEST  2 VIEW COMPARISON:  05/05/2015 FINDINGS: Enlargement of cardiac silhouette post MVR. Mediastinal contours and pulmonary vascularity normal. Atherosclerotic calcification aorta. Emphysematous and minimal bronchitic changes consistent with COPD. Bibasilar small pleural effusions greater on RIGHT. Minimal basilar atelectasis bilaterally. Upper lungs clear. No pneumothorax. Bones demineralized. IMPRESSION: Enlargement of cardiac silhouette post MVR. COPD changes with bibasilar small pleural effusions and basilar atelectasis. When compared to the previous exam, slightly improved bibasilar aeration. Electronically Signed   By: Lavonia Dana M.D.   On: 05/06/2015 14:28     CBC  Recent Labs Lab 05/06/15 1406 05/07/15 0530  WBC 5.2 6.1  HGB 9.4* 9.4*  HCT 29.4* 28.8*  PLT 218 236  MCV 94.5 93.2  MCH 30.2 30.4  MCHC 32.0 32.6  RDW 14.0 14.0  LYMPHSABS   --  0.8  MONOABS  --  0.7  EOSABS  --  0.5  BASOSABS  --  0.0    Chemistries   Recent Labs Lab 05/06/15 1406 05/07/15 0530 05/08/15 0324 05/09/15 0335  NA 142 141 143 141  K 4.4 3.3* 4.8 4.3  CL 108 104 108 106  CO2 25 27 27 28   GLUCOSE 277* 97 108* 103*  BUN 14 13 16 19   CREATININE 1.59* 1.50* 1.61* 1.85*  CALCIUM 8.0* 8.2* 7.7* 7.9*  MG  --   --  2.1  --   AST  --  23  --   --   ALT  --  20  --   --  ALKPHOS  --  77  --   --   BILITOT  --  0.8  --   --    ------------------------------------------------------------------------------------------------------------------ No results for input(s): CHOL, HDL, LDLCALC, TRIG, CHOLHDL, LDLDIRECT in the last 72 hours.  Lab Results  Component Value Date   HGBA1C 5.7* 05/07/2015   ------------------------------------------------------------------------------------------------------------------ No results for input(s): TSH, T4TOTAL, T3FREE, THYROIDAB in the last 72 hours.  Invalid input(s): FREET3 ------------------------------------------------------------------------------------------------------------------  Recent Labs  05/06/15 2122  VITAMINB12 114*  FOLATE 50.2  FERRITIN 37  TIBC 372  IRON 32*  RETICCTPCT 2.2    Coagulation profile  Recent Labs Lab 05/06/15 2122 05/07/15 0530 05/08/15 0324 05/09/15 0335  INR 4.37* 3.98* 2.86* 2.07*    No results for input(s): DDIMER in the last 72 hours.  Cardiac Enzymes  Recent Labs Lab 05/07/15 0106 05/07/15 0530 05/07/15 1232  TROPONINI 0.03 0.03 0.03   ------------------------------------------------------------------------------------------------------------------    Component Value Date/Time   BNP 1406.9* 05/06/2015 1406    Time Spent in minutes  35   Shaquasia Caponigro K M.D on 05/09/2015 at 9:29 AM  Between 7am to 7pm - Pager - [phone]  After 7pm go to [email or web address] - password [facility]  [facility] -  Office  [phone]

## 2015-05-09 NOTE — Progress Notes (Signed)
[place] for Coumadin/Heparin Indication: MVR  Allergies  Allergen Reactions  . Penicillins Other (See Comments)  . Vancomycin Other (See Comments)    Kidney problems    Patient Measurements: Height: 5\' 6"  (167.6 cm) Weight: 118 lb 12.8 oz (53.887 kg) (scale a) IBW/kg (Calculated) : 63.8  Vital Signs: Temp: 97.9 F (36.6 C) (02/26 1500) Temp Source: Oral (02/26 1500) BP: 111/54 mmHg (02/26 1500) Pulse Rate: 62 (02/26 1500)  Labs:  Recent Labs  05/07/15 0106 05/07/15 0530 05/07/15 1232 05/08/15 0324 05/09/15 0335 05/09/15 1516  HGB  --  9.4*  --   --   --   --   HCT  --  28.8*  --   --   --   --   PLT  --  236  --   --   --   --   LABPROT  --  37.9*  --  29.5* 23.2*  --   INR  --  3.98*  --  2.86* 2.07*  --   HEPARINUNFRC  --   --   --   --   --  <0.10*  CREATININE  --  1.50*  --  1.61* 1.85*  --   TROPONINI 0.03 0.03 0.03  --   --   --      Medical History: Past Medical History  Diagnosis Date  . Hypertension   . High cholesterol   . Chronic anticoagulation     coumadin for mechanical valves  . BPH (benign prostatic hyperplasia)   . CKD (chronic kidney disease), stage III   . Gastric cancer ([place]) 2005    s/p gastrectomy  . Rheumatic heart disease   . HOH (hard of hearing)   . Diabetes mellitus type 2, diet-controlled ([place])   . S/P MVR (mitral valve replacement) 2015    41mm StJude mechanical valve    Assessment: 75yo male c/o SOB x1wk and worsening BLE swelling on Coumadin for MVR; plan for [facility] Monday 2/27 with plans to hold coumadin for cath. To start heparin when INR below goal.  Current INR 2.07, last dose of Coumadin taken 2/22.  Initial HL undetectable on 850 units/h. No bleed/iv line issues per RN.  Heparin dosing weight: 54.7  Goal of Therapy:  INR 2.5-3.5 (verified w/ pt)   Plan:  Hold Coumadin Increase heparin to 1050 units/h 8 hour HL and daily CBC, HL LHC 2/27  Elicia Lamp, PharmD,  [facility] Clinical Pharmacist Pager [phone] 05/09/2015 4:29 PM

## 2015-05-09 NOTE — Progress Notes (Signed)
Pt saw cardiac cath and Heat failure video.

## 2015-05-10 ENCOUNTER — Encounter (HOSPITAL_COMMUNITY)
Admission: EM | Disposition: A | Discharge status: Home / Self Care | Payer: Self-pay | Source: Home / Self Care | Attending: Internal Medicine

## 2015-05-10 DIAGNOSIS — I2510 Atherosclerotic heart disease of native coronary artery without angina pectoris: Secondary | ICD-10-CM

## 2015-05-10 HISTORY — PX: CARDIAC CATHETERIZATION: SHX172

## 2015-05-10 LAB — CBC
HEMATOCRIT: 27.6 % — AB (ref 39.0–52.0)
HEMOGLOBIN: 8.5 g/dL — AB (ref 13.0–17.0)
MCH: 28.5 pg (ref 26.0–34.0)
MCHC: 30.8 g/dL (ref 30.0–36.0)
MCV: 92.6 fL (ref 78.0–100.0)
Platelets: 240 10*3/uL (ref 150–400)
RBC: 2.98 MIL/uL — ABNORMAL LOW (ref 4.22–5.81)
RDW: 13.7 % (ref 11.5–15.5)
WBC: 7.2 10*3/uL (ref 4.0–10.5)

## 2015-05-10 LAB — BASIC METABOLIC PANEL
ANION GAP: 9 (ref 5–15)
BUN: 24 mg/dL — AB (ref 6–20)
CO2: 26 mmol/L (ref 22–32)
Calcium: 7.8 mg/dL — ABNORMAL LOW (ref 8.9–10.3)
Chloride: 106 mmol/L (ref 101–111)
Creatinine, Ser: 1.87 mg/dL — ABNORMAL HIGH (ref 0.61–1.24)
GFR calc Af Amer: 39 mL/min — ABNORMAL LOW (ref >60)
GFR, EST NON AFRICAN AMERICAN: 33 mL/min — AB (ref >60)
Glucose, Bld: 121 mg/dL — ABNORMAL HIGH (ref 65–99)
POTASSIUM: 4.7 mmol/L (ref 3.5–5.1)
SODIUM: 141 mmol/L (ref 135–145)

## 2015-05-10 LAB — POCT I-STAT 3, VENOUS BLOOD GAS (G3P V)
Bicarbonate: 25.6 mEq/L — ABNORMAL HIGH (ref 20.0–24.0)
O2 Saturation: 67 %
PH VEN: 7.357 — AB (ref 7.250–7.300)
TCO2: 27 mmol/L (ref 0–100)
pCO2, Ven: 45.5 mmHg (ref 45.0–50.0)
pO2, Ven: 37 mmHg (ref 30.0–45.0)

## 2015-05-10 LAB — POCT I-STAT 3, ART BLOOD GAS (G3+)
ACID-BASE DEFICIT: 1 mmol/L (ref 0.0–2.0)
Bicarbonate: 24.5 mEq/L — ABNORMAL HIGH (ref 20.0–24.0)
O2 Saturation: 99 %
PCO2 ART: 41.7 mmHg (ref 35.0–45.0)
PO2 ART: 167 mmHg — AB (ref 80.0–100.0)
TCO2: 26 mmol/L (ref 0–100)
pH, Arterial: 7.377 (ref 7.350–7.450)

## 2015-05-10 LAB — HEPARIN LEVEL (UNFRACTIONATED)
HEPARIN UNFRACTIONATED: 0.12 [IU]/mL — AB (ref 0.30–0.70)
Heparin Unfractionated: 0.42 IU/mL (ref 0.30–0.70)

## 2015-05-10 LAB — PROTIME-INR
INR: 1.73 — ABNORMAL HIGH (ref 0.00–1.49)
Prothrombin Time: 20.2 seconds — ABNORMAL HIGH (ref 11.6–15.2)

## 2015-05-10 SURGERY — RIGHT/LEFT HEART CATH AND CORONARY ANGIOGRAPHY
Anesthesia: LOCAL

## 2015-05-10 MED ORDER — LIDOCAINE HCL (PF) 1 % IJ SOLN
INTRAMUSCULAR | Status: AC
  Filled 2015-05-10: qty 30

## 2015-05-10 MED ORDER — SODIUM CHLORIDE 0.9 % IV SOLN: INTRAVENOUS | Status: DC

## 2015-05-10 MED ORDER — HEPARIN (PORCINE) IN NACL 2-0.9 UNIT/ML-% IJ SOLN
INTRAMUSCULAR | Status: AC
  Filled 2015-05-10: qty 1000

## 2015-05-10 MED ORDER — IOHEXOL 350 MG/ML SOLN
INTRAVENOUS | Status: DC | PRN
  Administered 2015-05-10: 55 mL via INTRAVENOUS

## 2015-05-10 MED ORDER — SODIUM CHLORIDE 0.9 % IV SOLN
INTRAVENOUS | Status: DC
  Administered 2015-05-10: 16:00:00 via INTRAVENOUS

## 2015-05-10 MED ORDER — FENTANYL CITRATE (PF) 100 MCG/2ML IJ SOLN
INTRAMUSCULAR | Status: DC | PRN
  Administered 2015-05-10: 25 ug via INTRAVENOUS

## 2015-05-10 MED ORDER — VERAPAMIL HCL 2.5 MG/ML IV SOLN
INTRAVENOUS | Status: DC | PRN
  Administered 2015-05-10: 15:00:00 via INTRA_ARTERIAL

## 2015-05-10 MED ORDER — VERAPAMIL HCL 2.5 MG/ML IV SOLN
INTRAVENOUS | Status: AC
  Filled 2015-05-10: qty 2

## 2015-05-10 MED ORDER — WARFARIN SODIUM 5 MG PO TABS
5.0000 mg | ORAL_TABLET | Freq: Once | ORAL | Status: AC
  Administered 2015-05-10: 5 mg via ORAL
  Filled 2015-05-10: qty 1

## 2015-05-10 MED ORDER — SODIUM CHLORIDE 0.9% FLUSH
3.0000 mL | Freq: Two times a day (BID) | INTRAVENOUS | Status: DC
  Administered 2015-05-10 – 2015-05-11 (×2): 3 mL via INTRAVENOUS

## 2015-05-10 MED ORDER — WARFARIN - PHARMACIST DOSING INPATIENT
Freq: Every day | Status: DC
  Administered 2015-05-10 – 2015-05-11 (×2)

## 2015-05-10 MED ORDER — SODIUM CHLORIDE 0.9% FLUSH: 3.0000 mL | INTRAVENOUS | Status: DC | PRN

## 2015-05-10 MED ORDER — FENTANYL CITRATE (PF) 100 MCG/2ML IJ SOLN
INTRAMUSCULAR | Status: AC
  Filled 2015-05-10: qty 2

## 2015-05-10 MED ORDER — MIDAZOLAM HCL 2 MG/2ML IJ SOLN
INTRAMUSCULAR | Status: AC
  Filled 2015-05-10: qty 2

## 2015-05-10 MED ORDER — SODIUM CHLORIDE 0.9 % IV SOLN: 250.0000 mL | INTRAVENOUS | Status: DC | PRN

## 2015-05-10 MED ORDER — HEPARIN (PORCINE) IN NACL 100-0.45 UNIT/ML-% IJ SOLN
950.0000 [IU]/h | INTRAMUSCULAR | Status: DC
  Administered 2015-05-10: 1200 [IU]/h via INTRAVENOUS
  Filled 2015-05-10 (×4): qty 250

## 2015-05-10 MED ORDER — HEPARIN (PORCINE) IN NACL 2-0.9 UNIT/ML-% IJ SOLN
INTRAMUSCULAR | Status: DC | PRN
  Administered 2015-05-10: 1000 mL

## 2015-05-10 MED ORDER — HEPARIN SODIUM (PORCINE) 1000 UNIT/ML IJ SOLN
INTRAMUSCULAR | Status: DC | PRN
  Administered 2015-05-10: 3000 [IU] via INTRAVENOUS

## 2015-05-10 MED ORDER — LIDOCAINE HCL (PF) 1 % IJ SOLN
INTRAMUSCULAR | Status: DC | PRN
  Administered 2015-05-10: 3 mL

## 2015-05-10 MED ORDER — SODIUM CHLORIDE 0.9 % IV SOLN
INTRAVENOUS | Status: AC
  Administered 2015-05-10: 14:00:00 via INTRAVENOUS

## 2015-05-10 MED ORDER — HEPARIN SODIUM (PORCINE) 1000 UNIT/ML IJ SOLN
INTRAMUSCULAR | Status: AC
  Filled 2015-05-10: qty 1

## 2015-05-10 MED ORDER — FUROSEMIDE 10 MG/ML IJ SOLN
40.0000 mg | Freq: Two times a day (BID) | INTRAMUSCULAR | Status: DC
  Administered 2015-05-10 – 2015-05-11 (×2): 40 mg via INTRAVENOUS
  Filled 2015-05-10 (×2): qty 4

## 2015-05-10 MED ORDER — MIDAZOLAM HCL 2 MG/2ML IJ SOLN
INTRAMUSCULAR | Status: DC | PRN
  Administered 2015-05-10: 1 mg via INTRAVENOUS

## 2015-05-10 SURGICAL SUPPLY — 13 items
CATH BALLN WEDGE 5F 110CM (CATHETERS) ×2 IMPLANT
CATH INFINITI 5 FR JL3.5 (CATHETERS) ×2 IMPLANT
CATH INFINITI 5FR ANG PIGTAIL (CATHETERS) ×2 IMPLANT
CATH INFINITI JR4 5F (CATHETERS) ×2 IMPLANT
DEVICE RAD COMP TR BAND LRG (VASCULAR PRODUCTS) ×2 IMPLANT
GLIDESHEATH SLEND SS 6F .021 (SHEATH) ×2 IMPLANT
KIT HEART LEFT (KITS) ×2 IMPLANT
KIT HEART RIGHT NAMIC (KITS) ×2 IMPLANT
PACK CARDIAC CATHETERIZATION (CUSTOM PROCEDURE TRAY) ×2 IMPLANT
SHEATH FAST CATH BRACH 5F 5CM (SHEATH) ×2 IMPLANT
TRANSDUCER W/STOPCOCK (MISCELLANEOUS) ×4 IMPLANT
TUBING CIL FLEX 10 FLL-RA (TUBING) ×2 IMPLANT
WIRE SAFE-T 1.5MM-J .035X260CM (WIRE) ×2 IMPLANT

## 2015-05-10 NOTE — H&P (View-Only) (Signed)
Patient Name: Robert [name] Date of Encounter: 05/10/2015    SUBJECTIVE: Difficult situation in this patient with multi-facility  healthcare, receiving who receives much of this care at [facility], [facility], and [facility]. He was admitted with  increasing dyspnea and has known mechanical mitral valve. By 2-D echo, valve function is normal. Regional wall motion abnormality was noted and there is concern that acute heart failure is ischemia related in this diabetic. Cardiac markers and electrocardiograms have been unremarkable.   TELEMETRY: Normal sinus rhythm Filed Vitals:   05/09/15 1500 05/09/15 2113 05/10/15 0409 05/10/15 0800  BP: 111/54 116/60 141/69 125/56  Pulse: 62 59 58 57  Temp: 97.9 F (36.6 C) 97.6 F (36.4 C) 97.2 F (36.2 C) 98.5 F (36.9 C)  TempSrc: Oral Oral Oral Oral  Resp: 18 18 18 16   Height:      Weight:   121 lb (54.885 kg)   SpO2: 100% 100% 98% 99%    Intake/Output Summary (Last 24 hours) at 05/10/15 0950 Last data filed at 05/10/15 0820  Gross per 24 hour  Intake    620 ml  Output    800 ml  Net   -180 ml   LABS: Basic Metabolic Panel:  Recent Labs  05/08/15 0324 05/09/15 0335 05/10/15 0015  NA 143 141 141  K 4.8 4.3 4.7  CL 108 106 106  CO2 27 28 26   GLUCOSE 108* 103* 121*  BUN 16 19 24*  CREATININE 1.61* 1.85* 1.87*  CALCIUM 7.7* 7.9* 7.8*  MG 2.1  --   --    CBC:  Recent Labs  05/10/15 0015  WBC 7.2  HGB 8.5*  HCT 27.6*  MCV 92.6  PLT 240   Cardiac Enzymes:  Recent Labs  05/07/15 1232  TROPONINI 0.03   BNP    Component Value Date/Time   BNP 1406.9* 05/06/2015 1406    ProBNP No results found for: PROBNP   Radiology/Studies:   05/07/15: Chest x-ray-IMPRESSION: Stable postoperative change and areas of mild scarring in both lower lung zones. No edema or consolidation. Heart borderline enlarged but stable.  Physical Exam: Blood pressure 125/56, pulse 57,  temperature 98.5 F (36.9 C), temperature source Oral, resp. rate 16, height 5\' 6"  (1.676 m), weight 121 lb (54.885 kg), SpO2 99 %. Weight change: 2 lb 3.2 oz (0.998 kg)  Wt Readings from Last 3 Encounters:  05/10/15 121 lb (54.885 kg)   Neck veins are flat. Chest with decreased breath sounds and rales at bases. Cardiac reveals decreased intensity of mechanical valve closure. Extremities reveal no edema. Neuro is intact  ASSESSMENT:   1. Acute combined systolic and diastolic heart failure , with new systolic impairment compared to prior data from other institutions. 2. Coronary artery disease with prior documentation of 70% diagonal and 55% LAD. 3. Current echo suggests inferoapical hypokinesis  4. Acute on chronic kidney disease, stage III  Plan:  1. Diuretics and ARB therapy are being held today. 2. Hydration started at midnight 3. 3 cc /KG per hour fluid bolus on call to cath lab. 4. Plan L and R heat cath from brachial/radial approach. Avoid LV gram but do measure LV pressure. This will decrease contrast exposure. 5. Therapy of anemia and full evaluation of potential multifactorial etiology/components. 6.  If CAD requires intervention, may need to be staged after further discussion and thought. 7. The patient was counseled to undergo left and right heart catheterization with  coronary angiography (possible percutaneous coronary intervention with stent implantation staged). The procedural risks and benefits were discussed in detail. The risks discussed included death, stroke, myocardial infarction, life-threatening bleeding, limb ischemia, kidney injury, allergy, and possible emergency cardiac surgery. The risk of these significant complications were estimated to occur less than 1% of the time. After discussion, the patient has agreed to proceed.    Demetrios Isaacs 05/10/2015, 9:50 AM

## 2015-05-10 NOTE — Progress Notes (Signed)
ANTICOAGULATION CONSULT NOTE - Follow Up Consult  Pharmacy Consult for Heparin Indication: MVR  Allergies  Allergen Reactions  . Penicillins Other (See Comments)  . Vancomycin Other (See Comments)    Kidney problems    Patient Measurements: Height: 5\' 6"  (167.6 cm) Weight: 121 lb (54.885 kg) (scale a) IBW/kg (Calculated) : 63.8 Heparin Dosing Weight: 54.9 kg  Vital Signs: Temp: 98.5 F (36.9 C) (02/27 0800) Temp Source: Oral (02/27 0800) BP: 125/56 mmHg (02/27 0800) Pulse Rate: 57 (02/27 0800)  Labs:  Recent Labs  05/07/15 1232 05/08/15 0324 05/09/15 0335 05/09/15 1516 05/10/15 0015 05/10/15 0856  HGB  --   --   --   --  8.5*  --   HCT  --   --   --   --  27.6*  --   PLT  --   --   --   --  240  --   LABPROT  --  29.5* 23.2*  --  20.2*  --   INR  --  2.86* 2.07*  --  1.73*  --   HEPARINUNFRC  --   --   --  <0.10* 0.12* 0.42  CREATININE  --  1.61* 1.85*  --  1.87*  --   TROPONINI 0.03  --   --   --   --   --     Estimated Creatinine Clearance: 26.1 mL/min (by C-G formula based on Cr of 1.87).  Assessment: 75yo male c/o SOB x1wk and worsening BLE swelling, to continue Coumadin for MVR during admission; current INR above goal, last dose of Coumadin taken 2/22.  Anticoagulation: On Coumadin PTA 2.5-3.5 for MVR. Holding for [facility] on Monday. INR 1.73. Heparin level 0.42. Hgb 9.4>8.5.  Goal of Therapy:  Heparin level 0.3-0.7 units/ml Monitor platelets by anticoagulation protocol: Yes   Plan:  Continue IV heparin at 1200 units/hr Daily HL and CBC Will f/u after cath today for resumption of heparin +/- oral anticoagulation.   Nastashia Gallo S. Alford Highland, PharmD, BCPS Clinical Staff Pharmacist Pager [phone]  Eilene Ghazi Stillinger 05/10/2015,10:45 AM

## 2015-05-10 NOTE — Progress Notes (Signed)
ANTICOAGULATION CONSULT NOTE - Follow Up Consult  Pharmacy Consult for Heparin Indication: MVR  Allergies  Allergen Reactions  . Penicillins Other (See Comments)  . Vancomycin Other (See Comments)    Kidney problems    Patient Measurements: Height: 5\' 6"  (167.6 cm) Weight: 121 lb (54.885 kg) (scale a) IBW/kg (Calculated) : 63.8 Heparin Dosing Weight: 54.9 kg  Vital Signs: Temp: 97.6 F (36.4 C) (02/27 1130) Temp Source: Oral (02/27 1130) BP: 127/72 mmHg (02/27 1505) Pulse Rate: 0 (02/27 1510)  Labs:  Recent Labs  05/08/15 0324 05/09/15 0335 05/09/15 1516 05/10/15 0015 05/10/15 0856  HGB  --   --   --  8.5*  --   HCT  --   --   --  27.6*  --   PLT  --   --   --  240  --   LABPROT 29.5* 23.2*  --  20.2*  --   INR 2.86* 2.07*  --  1.73*  --   HEPARINUNFRC  --   --  <0.10* 0.12* 0.42  CREATININE 1.61* 1.85*  --  1.87*  --     Estimated Creatinine Clearance: 26.1 mL/min (by C-G formula based on Cr of 1.87).  Assessment: 75yo male on coumadin for MVR s/p cath today. INR 1.73, will resume coumadin tonight with heparin bridge. Heparin level was therapeutic on 1200 units/hr this mroning. ; current INR above goal, last dose of Coumadin taken 2/22. Sheath removed at 1505  Coumadin PTA dose: 4.5 mg on M,W,Th, Sat; 3 mg on Tu, F, Sun.  Goal of Therapy:  INR = 2.5 - 3.5 Heparin level 0.3-0.7 units/ml Monitor platelets by anticoagulation protocol: Yes   Plan:  IV heparin 1200 units/hr with no bolus at 2300 Coumadin 5 mg po x 1 Daily PT/INR, HL and CBC  Maryanna Shape, PharmD, BCPS  Clinical Pharmacist  Pager: [phone]   05/10/2015,3:28 PM

## 2015-05-10 NOTE — Care Management Important Message (Signed)
Important Message  Patient Details  Name: DEMORRIS JANIS MRN: [id number] Date of Birth: 06-30-1940   Medicare Important Message Given:  Yes    Barb Merino Brielynn Sekula 05/10/2015, 4:40 PM

## 2015-05-10 NOTE — Interval H&P Note (Signed)
History and Physical Interval Note:  05/10/2015 2:13 PM  Robert [name]  has presented today for cardiac cath with the diagnosis of acute CHF, abnormal echo. The various methods of treatment have been discussed with the patient and family. After consideration of risks, benefits and other options for treatment, the patient has consented to  Procedure(s): Right/Left Heart Cath and Coronary Angiography (N/A) as a surgical intervention .  The patient's history has been reviewed, patient examined, no change in status, stable for surgery.  I have reviewed the patient's chart and labs.  Questions were answered to the patient's satisfaction.    No PCI is planned.    Shardai Star

## 2015-05-10 NOTE — Progress Notes (Addendum)
Patient Demographics:    Robert [name], is a 75 y.o. male, DOB - 29-Mar-1940, [id number]  Admit date - 05/06/2015   Admitting Physician Rise Patience, MD  Outpatient Primary MD for the patient is [name], MD  LOS - 4   Chief Complaint  Patient presents with  . Shortness of Breath        Subjective:    Robert [name] today has, No headache, No chest pain, No abdominal pain - No Nausea, No new weakness tingling or numbness, No Cough - Improved SOB.    Assessment  & Plan :     1. Acute on chronic combined systolic and diastolic heart failure. Last EF 60% but has now dropped to 40% with some wall motion abnormality. History of mechanical mitral valve with therapeutic INR, much improved after Lasix and 4.1 L -ve, no shortness of breath, chest x-ray now clear, placed on beta blocker, Lasix change to QD PO on 05-09-15, also on Aldactone, diuretics on hold for left heart catheterization due on 05/10/2015, cardiology following, echo findings suspicious for underlying CAD, due for left heart catheterization on 05/10/2015.  2. 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 INR therapeutic now on Hep bridge per Cards for L Heart Cath, nonacute TTE.  3. Anemia of chronic disease. Low B-12 as well, placed on B-12 supplementation, iron levels borderline and is on oral iron supplementation as well at home, his hemoglobin is between 9-9.5 going back to 2015 , outpatient age-appropriate workup by PCP.  4. Low B-12 levels. Placed on replacement.  5. Hypertension.  On ARB, added Coreg, continue diuresis and monitor. IV hydralazine as needed.  6. BPH. On Flomax continue.  7. Dyslipidemia. On statin continue.  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    Code  Status : Full  Family Communication  : wife  Disposition Plan  : Home 1-2 days  Consults  : Cards  Procedures  :   TTE  LVEF 45-50%, inferior hypokinesis, trileaflet sclerotic aorticvalve with mild to moderate regurgitation, tilting bileafletmechanical AVR without obstruction and normal bileaflet motion, mild TR, RVSP 38 mmHG + RAP.  Left heart catheterization 05/10/2015   DVT Prophylaxis  :  Coumadin Lab Results  Component Value Date   INR 1.73* 05/10/2015   INR 2.07* 05/09/2015   INR 2.86* 05/08/2015     Lab Results  Component Value Date   PLT 240 05/10/2015    Inpatient Medications  Scheduled Meds: . [START ON 05/11/2015] aspirin  81 mg Oral Daily  . atorvastatin  20 mg Oral QHS  . carvedilol  6.25 mg Oral BID WC  . cyanocobalamin  1,000 mcg Intramuscular Daily  . ferrous sulfate  325 mg Oral BID WC  . isosorbide mononitrate  30 mg Oral Daily  . multivitamin with minerals  1 tablet Oral Daily  . sodium chloride flush  3 mL Intravenous Q12H  . tamsulosin  0.4 mg Oral QPC supper   Continuous Infusions: . sodium chloride 50 mL/hr at 05/10/15 0502  . heparin 1,200 Units/hr (05/10/15 0100)   PRN Meds:.sodium chloride, acetaminophen **OR** [DISCONTINUED] acetaminophen, hydrALAZINE, [DISCONTINUED] ondansetron **OR** ondansetron (ZOFRAN) IV, sodium chloride flush  Antibiotics  :  Anti-infectives    None        Objective:   Filed Vitals:   05/09/15 1500 05/09/15 2113 05/10/15 0409 05/10/15 0800  BP: 111/54 116/60 141/69 125/56  Pulse: 62 59 58 57  Temp: 97.9 F (36.6 C) 97.6 F (36.4 C) 97.2 F (36.2 C) 98.5 F (36.9 C)  TempSrc: Oral Oral Oral Oral  Resp: 18 18 18 16   Height:      Weight:   54.885 kg (121 lb)   SpO2: 100% 100% 98% 99%    Wt Readings from Last 3 Encounters:  05/10/15 54.885 kg (121 lb)     Intake/Output Summary (Last 24 hours) at 05/10/15 1013 Last data filed at 05/10/15 0820  Gross per 24 hour  Intake    620 ml   Output    800 ml  Net   -180 ml     Physical Exam  Awake Alert, Oriented X 3, No new F.N deficits, Normal affect Robert [name].AT,PERRAL Supple Neck,No JVD, No cervical lymphadenopathy appriciated.  Symmetrical Chest wall movement, Good air movement bilaterally, CTAB RRR,No Gallops,Rubs , + mitral Murmur, No Parasternal Heave +ve B.Sounds, Abd Soft, No tenderness, No organomegaly appriciated, No rebound - guarding or rigidity. No Cyanosis, Clubbing or edema, No new Rash or bruise      Data Review:   Micro Results No results found for this or any previous visit (from the past 240 hour(s)).  Radiology Reports Dg Chest 2 View  05/07/2015  CLINICAL DATA:  Shortness of breath for 1 week EXAM: CHEST  2 VIEW COMPARISON:  May 06, 2015 FINDINGS: Postoperative changes noted on the right. Patient is status post mitral valve replacement. There are areas of mild scarring in both lower lung zones. There is no edema or consolidation. Heart is borderline enlarged with pulmonary vascularity within normal limits. No adenopathy. No bone lesions. IMPRESSION: Stable postoperative change and areas of mild scarring in both lower lung zones. No edema or consolidation. Heart borderline enlarged but stable. Electronically Signed   By: Lowella Grip III M.D.   On: 05/07/2015 09:37   Dg Chest 2 View  05/06/2015  CLINICAL DATA:  Shortness of breath, weakness and cough for 2 days, hypertension EXAM: CHEST  2 VIEW COMPARISON:  05/05/2015 FINDINGS: Enlargement of cardiac silhouette post MVR. Mediastinal contours and pulmonary vascularity normal. Atherosclerotic calcification aorta. Emphysematous and minimal bronchitic changes consistent with COPD. Bibasilar small pleural effusions greater on RIGHT. Minimal basilar atelectasis bilaterally. Upper lungs clear. No pneumothorax. Bones demineralized. IMPRESSION: Enlargement of cardiac silhouette post MVR. COPD changes with bibasilar small pleural effusions and basilar  atelectasis. When compared to the previous exam, slightly improved bibasilar aeration. Electronically Signed   By: Lavonia Dana M.D.   On: 05/06/2015 14:28     CBC  Recent Labs Lab 05/06/15 1406 05/07/15 0530 05/10/15 0015  WBC 5.2 6.1 7.2  HGB 9.4* 9.4* 8.5*  HCT 29.4* 28.8* 27.6*  PLT 218 236 240  MCV 94.5 93.2 92.6  MCH 30.2 30.4 28.5  MCHC 32.0 32.6 30.8  RDW 14.0 14.0 13.7  LYMPHSABS  --  0.8  --   MONOABS  --  0.7  --   EOSABS  --  0.5  --   BASOSABS  --  0.0  --     Chemistries   Recent Labs Lab 05/06/15 1406 05/07/15 0530 05/08/15 0324 05/09/15 0335 05/10/15 0015  NA 142 141 143 141 141  K 4.4 3.3* 4.8 4.3 4.7  CL 108 104 108  106 106  CO2 25 27 27 28 26   GLUCOSE 277* 97 108* 103* 121*  BUN 14 13 16 19  24*  CREATININE 1.59* 1.50* 1.61* 1.85* 1.87*  CALCIUM 8.0* 8.2* 7.7* 7.9* 7.8*  MG  --   --  2.1  --   --   AST  --  23  --   --   --   ALT  --  20  --   --   --   ALKPHOS  --  77  --   --   --   BILITOT  --  0.8  --   --   --    ------------------------------------------------------------------------------------------------------------------ No results for input(s): CHOL, HDL, LDLCALC, TRIG, CHOLHDL, LDLDIRECT in the last 72 hours.  Lab Results  Component Value Date   HGBA1C 5.7* 05/07/2015   ------------------------------------------------------------------------------------------------------------------ No results for input(s): TSH, T4TOTAL, T3FREE, THYROIDAB in the last 72 hours.  Invalid input(s): FREET3 ------------------------------------------------------------------------------------------------------------------ No results for input(s): VITAMINB12, FOLATE, FERRITIN, TIBC, IRON, RETICCTPCT in the last 72 hours.  Coagulation profile  Recent Labs Lab 05/06/15 2122 05/07/15 0530 05/08/15 0324 05/09/15 0335 05/10/15 0015  INR 4.37* 3.98* 2.86* 2.07* 1.73*    No results for input(s): DDIMER in the last 72 hours.  Cardiac  Enzymes  Recent Labs Lab 05/07/15 0106 05/07/15 0530 05/07/15 1232  TROPONINI 0.03 0.03 0.03   ------------------------------------------------------------------------------------------------------------------    Component Value Date/Time   BNP 1406.9* 05/06/2015 1406    Time Spent in minutes  35   Mamoudou Mulvehill K M.D on 05/10/2015 at 10:13 AM  Between 7am to 7pm - Pager - [phone]  After 7pm go to [email or web address] - password [facility]  [facility] -  Office  [phone]

## 2015-05-10 NOTE — Progress Notes (Signed)
ANTICOAGULATION CONSULT NOTE - Follow Up Consult  Pharmacy Consult for heparin Indication: MVR  Labs:  Recent Labs  05/07/15 0106  05/07/15 0530 05/07/15 1232 05/08/15 0324 05/09/15 0335 05/09/15 1516 05/10/15 0015  HGB  --   --  9.4*  --   --   --   --  8.5*  HCT  --   --  28.8*  --   --   --   --  27.6*  PLT  --   --  236  --   --   --   --  240  LABPROT  --   < > 37.9*  --  29.5* 23.2*  --  20.2*  INR  --   < > 3.98*  --  2.86* 2.07*  --  1.73*  HEPARINUNFRC  --   --   --   --   --   --  <0.10* 0.12*  CREATININE  --   --  1.50*  --  1.61* 1.85*  --   --   TROPONINI 0.03  --  0.03 0.03  --   --   --   --   < > = values in this interval not displayed.   Assessment: 75yo male remains subtherapeutic on heparin after rate increase.  Goal of Therapy:  Heparin level 0.3-0.7 units/ml   Plan:  Will increase heparin gtt by 3 units/kg/hr to 1200 units/hr and check level in [place], PharmD, BCPS  05/10/2015,12:58 AM

## 2015-05-10 NOTE — Progress Notes (Addendum)
Patient Name: Robert [name] Date of Encounter: 05/10/2015    SUBJECTIVE: Difficult situation in this patient with multi-facility  healthcare, receiving who receives much of this care at [facility], [facility], and [facility]. He was admitted with  increasing dyspnea and has known mechanical mitral valve. By 2-D echo, valve function is normal. Regional wall motion abnormality was noted and there is concern that acute heart failure is ischemia related in this diabetic. Cardiac markers and electrocardiograms have been unremarkable.   TELEMETRY: Normal sinus rhythm Filed Vitals:   05/09/15 1500 05/09/15 2113 05/10/15 0409 05/10/15 0800  BP: 111/54 116/60 141/69 125/56  Pulse: 62 59 58 57  Temp: 97.9 F (36.6 C) 97.6 F (36.4 C) 97.2 F (36.2 C) 98.5 F (36.9 C)  TempSrc: Oral Oral Oral Oral  Resp: 18 18 18 16   Height:      Weight:   121 lb (54.885 kg)   SpO2: 100% 100% 98% 99%    Intake/Output Summary (Last 24 hours) at 05/10/15 0950 Last data filed at 05/10/15 0820  Gross per 24 hour  Intake    620 ml  Output    800 ml  Net   -180 ml   LABS: Basic Metabolic Panel:  Recent Labs  05/08/15 0324 05/09/15 0335 05/10/15 0015  NA 143 141 141  K 4.8 4.3 4.7  CL 108 106 106  CO2 27 28 26   GLUCOSE 108* 103* 121*  BUN 16 19 24*  CREATININE 1.61* 1.85* 1.87*  CALCIUM 7.7* 7.9* 7.8*  MG 2.1  --   --    CBC:  Recent Labs  05/10/15 0015  WBC 7.2  HGB 8.5*  HCT 27.6*  MCV 92.6  PLT 240   Cardiac Enzymes:  Recent Labs  05/07/15 1232  TROPONINI 0.03   BNP    Component Value Date/Time   BNP 1406.9* 05/06/2015 1406    ProBNP No results found for: PROBNP   Radiology/Studies:   05/07/15: Chest x-ray-IMPRESSION: Stable postoperative change and areas of mild scarring in both lower lung zones. No edema or consolidation. Heart borderline enlarged but stable.  Physical Exam: Blood pressure 125/56, pulse 57,  temperature 98.5 F (36.9 C), temperature source Oral, resp. rate 16, height 5\' 6"  (1.676 m), weight 121 lb (54.885 kg), SpO2 99 %. Weight change: 2 lb 3.2 oz (0.998 kg)  Wt Readings from Last 3 Encounters:  05/10/15 121 lb (54.885 kg)   Neck veins are flat. Chest with decreased breath sounds and rales at bases. Cardiac reveals decreased intensity of mechanical valve closure. Extremities reveal no edema. Neuro is intact  ASSESSMENT:   1. Acute combined systolic and diastolic heart failure , with new systolic impairment compared to prior data from other institutions. 2. Coronary artery disease with prior documentation of 70% diagonal and 55% LAD. 3. Current echo suggests inferoapical hypokinesis  4. Acute on chronic kidney disease, stage III  Plan:  1. Diuretics and ARB therapy are being held today. 2. Hydration started at midnight 3. 3 cc /KG per hour fluid bolus on call to cath lab. 4. Plan L and R heat cath from brachial/radial approach. Avoid LV gram but do measure LV pressure. This will decrease contrast exposure. 5. Therapy of anemia and full evaluation of potential multifactorial etiology/components. 6.  If CAD requires intervention, may need to be staged after further discussion and thought. 7. The patient was counseled to undergo left and right heart catheterization with  coronary angiography (possible percutaneous coronary intervention with stent implantation staged). The procedural risks and benefits were discussed in detail. The risks discussed included death, stroke, myocardial infarction, life-threatening bleeding, limb ischemia, kidney injury, allergy, and possible emergency cardiac surgery. The risk of these significant complications were estimated to occur less than 1% of the time. After discussion, the patient has agreed to proceed.  This was a markedly prolonged visit lasting greater than 45 minutes. Greater than 70% of the time spent was in counseling, speaking with the  patient's brother who is a physician concerning his care, discussing the pros and cons of angiography, and coordinating care including speaking with the [facility] who will be performing angiography.  Demetrios Isaacs 05/10/2015, 9:50 AM

## 2015-05-11 ENCOUNTER — Inpatient Hospital Stay (HOSPITAL_COMMUNITY): Payer: BC Managed Care – PPO

## 2015-05-11 ENCOUNTER — Encounter (HOSPITAL_COMMUNITY): Payer: Self-pay | Admitting: Cardiovascular Disease

## 2015-05-11 LAB — BASIC METABOLIC PANEL
Anion gap: 7 (ref 5–15)
BUN: 23 mg/dL — AB (ref 6–20)
CO2: 28 mmol/L (ref 22–32)
Calcium: 7.7 mg/dL — ABNORMAL LOW (ref 8.9–10.3)
Chloride: 107 mmol/L (ref 101–111)
Creatinine, Ser: 1.8 mg/dL — ABNORMAL HIGH (ref 0.61–1.24)
GFR, EST AFRICAN AMERICAN: 40 mL/min — AB (ref >60)
GFR, EST NON AFRICAN AMERICAN: 35 mL/min — AB (ref >60)
Glucose, Bld: 92 mg/dL (ref 65–99)
POTASSIUM: 4.2 mmol/L (ref 3.5–5.1)
SODIUM: 142 mmol/L (ref 135–145)

## 2015-05-11 LAB — HEPARIN LEVEL (UNFRACTIONATED): HEPARIN UNFRACTIONATED: 0.47 [IU]/mL (ref 0.30–0.70)

## 2015-05-11 LAB — PROTIME-INR
INR: 1.49 (ref 0.00–1.49)
PROTHROMBIN TIME: 18.1 s — AB (ref 11.6–15.2)

## 2015-05-11 LAB — CBC
HCT: 29.3 % — ABNORMAL LOW (ref 39.0–52.0)
HEMOGLOBIN: 9.4 g/dL — AB (ref 13.0–17.0)
MCH: 29.9 pg (ref 26.0–34.0)
MCHC: 32.1 g/dL (ref 30.0–36.0)
MCV: 93.3 fL (ref 78.0–100.0)
Platelets: 249 10*3/uL (ref 150–400)
RBC: 3.14 MIL/uL — AB (ref 4.22–5.81)
RDW: 13.8 % (ref 11.5–15.5)
WBC: 5.7 10*3/uL (ref 4.0–10.5)

## 2015-05-11 LAB — BRAIN NATRIURETIC PEPTIDE: B NATRIURETIC PEPTIDE 5: 656.6 pg/mL — AB (ref 0.0–100.0)

## 2015-05-11 MED ORDER — FUROSEMIDE 40 MG PO TABS
40.0000 mg | ORAL_TABLET | Freq: Two times a day (BID) | ORAL | Status: DC
  Administered 2015-05-11: 40 mg via ORAL
  Filled 2015-05-11 (×2): qty 1

## 2015-05-11 MED ORDER — WARFARIN SODIUM 5 MG PO TABS
5.0000 mg | ORAL_TABLET | Freq: Once | ORAL | Status: AC
  Administered 2015-05-11: 5 mg via ORAL
  Filled 2015-05-11: qty 1

## 2015-05-11 NOTE — Progress Notes (Signed)
ANTICOAGULATION CONSULT NOTE - Follow Up Consult  Pharmacy Consult for Heparin Indication: MVR  Allergies  Allergen Reactions  . Penicillins Other (See Comments)  . Vancomycin Other (See Comments)    Kidney problems    Patient Measurements: Height: 5\' 6"  (167.6 cm) Weight: 121 lb 6.4 oz (55.067 kg) (a scale) IBW/kg (Calculated) : 63.8 Heparin Dosing Weight: 54.9 kg  Vital Signs: Temp: 98 F (36.7 C) (02/28 0632) Temp Source: Oral (02/28 [id number]) BP: 142/61 mmHg (02/28 [id number]) Pulse Rate: 59 (02/28 0632)  Labs:  Recent Labs  05/09/15 0335  05/10/15 0015 05/10/15 0856 05/11/15 0551  HGB  --   --  8.5*  --  9.4*  HCT  --   --  27.6*  --  29.3*  PLT  --   --  240  --  249  LABPROT 23.2*  --  20.2*  --  18.1*  INR 2.07*  --  1.73*  --  1.49  HEPARINUNFRC  --   < > 0.12* 0.42 0.47  CREATININE 1.85*  --  1.87*  --  1.80*  < > = values in this interval not displayed.  Estimated Creatinine Clearance: 27.2 mL/min (by C-G formula based on Cr of 1.8).  Assessment: 75yo male c/o SOB x1wk and worsening BLE swelling, to continue Coumadin for MVR during admission; current INR above goal, last dose of Coumadin taken 2/22.  Anticoagulation: On Coumadin PTA 2.5-3.5 for MVR. Hep + Coumadin resumed post-cath 2/17. HL 0.47. INR 1.49. CBC stable today. - PTA Coum: 4.5mg  on MWThSat, 3mg  Sun, Tues, Fri with admit INR 4.37  Goal of Therapy:  Heparin level 0.3-0.7 units/ml Monitor platelets by anticoagulation protocol: Yes   Plan:  Continue IV heparin at 1200 units/hr Repeat Coumadin 5mg  po x 1 tonight. Daily HL and CBC   Thi Klich S. Alford Highland, PharmD, BCPS Clinical Staff Pharmacist Pager [phone]  Eilene Ghazi Stillinger 05/11/2015,8:25 AM

## 2015-05-11 NOTE — Progress Notes (Addendum)
Patient Demographics:    Robert [name], is a 75 y.o. male, DOB - 1940/07/19, [id number]  Admit date - 05/06/2015   Admitting Physician Rise Patience, MD  Outpatient Primary MD for the patient is Burman Freestone, MD  LOS - 5  Summary  This is a pleasant 75 year old male with history of mechanical mitral valve, essential hypertension, anemia of chronic disease, CK D stage IV was admitted to the hospital with acute on chronic systolic heart failure, he had a drop in his EF as compared to a previous echogram along with new wall motion abnormality, cardiology was consulted he underwent a left heart catheterization which showed mild-to-moderate CAD. With diuresis his shortness of breath has completely resolved. He is currently awaiting his INR to touch 2.5 before he can be discharged. Being bridged with heparin for now.  Chief Complaint  Patient presents with  . Shortness of Breath        Subjective:    Robert [name] today has, No headache, No chest pain, No abdominal pain - No Nausea, No new weakness tingling or numbness, No Cough - Improved SOB.    Assessment  & Plan :     1. Acute on chronic combined systolic and diastolic heart failure. Last EF 60% but has now dropped to 40% with some wall motion abnormality. History of mechanical mitral valve with therapeutic INR.  Seen by cardiology, underwent left heart catheterization showing mild to moderate CAD, clinically he is much improved after diuresis with Lasix, he is currently 5 L negative with resolution of shortness of breath. Continue diuresis with oral Lasix, resumed Coumadin for mechanical valve.   2. 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 INR subtherapeutic now on Hep bridge for L Heart Cath, stop heparin  once INR touch is 2.5, he had a nonacute TTE.  3. Anemia of chronic disease. Low B-12 as well, placed on B-12 supplementation, iron levels borderline and he is on oral iron supplementation as well at home, his hemoglobin is between 9-9.5 going back to 2015 , outpatient age-appropriate workup by PCP. Note patient never has had a colonoscopy. Must follow with GI.  4. Low B-12 levels. Placed on replacement.  5. Hypertension.  On ARB, added Coreg, continue diuresis and monitor. IV hydralazine as needed.  6. BPH. On Flomax continue.  7. Dyslipidemia. On statin continue.  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    Code Status : Full  Family Communication  : wife  Disposition Plan  : Home 1-2 days  Consults  : Cards  Procedures  :   TTE  LVEF 45-50%, inferior hypokinesis, trileaflet sclerotic aorticvalve with mild to moderate regurgitation, tilting bileafletmechanical AVR without obstruction and normal bileaflet motion, mild TR, RVSP 38 mmHG + RAP.  Left heart catheterization 05/10/2015   DVT Prophylaxis  :  Coumadin Lab Results  Component Value Date   INR 1.49 05/11/2015   INR 1.73* 05/10/2015   INR 2.07* 05/09/2015     Lab Results  Component Value Date   PLT 249 05/11/2015    Inpatient Medications  Scheduled Meds: . aspirin  81 mg Oral Daily  . atorvastatin  20 mg Oral QHS  . carvedilol  6.25 mg  Oral BID WC  . cyanocobalamin  1,000 mcg Intramuscular Daily  . ferrous sulfate  325 mg Oral BID WC  . furosemide  40 mg Intravenous BID  . isosorbide mononitrate  30 mg Oral Daily  . multivitamin with minerals  1 tablet Oral Daily  . sodium chloride flush  3 mL Intravenous Q12H  . tamsulosin  0.4 mg Oral QPC supper  . warfarin  5 mg Oral ONCE-1800  . Warfarin - Pharmacist Dosing Inpatient   Does not apply q1800   Continuous Infusions: . heparin 1,200 Units/hr (05/10/15 2247)   PRN Meds:.sodium chloride, acetaminophen **OR** [DISCONTINUED]  acetaminophen, hydrALAZINE, [DISCONTINUED] ondansetron **OR** ondansetron (ZOFRAN) IV, sodium chloride flush  Antibiotics  :     Anti-infectives    None        Objective:   Filed Vitals:   05/10/15 1710 05/10/15 1740 05/10/15 1810 05/11/15 0632  BP: 119/53 141/66 123/53 142/61  Pulse: 70 78 67 59  Temp:    98 F (36.7 C)  TempSrc:    Oral  Resp:    16  Height:      Weight:    55.067 kg (121 lb 6.4 oz)  SpO2:    100%    Wt Readings from Last 3 Encounters:  05/11/15 55.067 kg (121 lb 6.4 oz)     Intake/Output Summary (Last 24 hours) at 05/11/15 0925 Last data filed at 05/11/15 [id number]  Gross per 24 hour  Intake  326.6 ml  Output   1500 ml  Net -1173.4 ml     Physical Exam  Awake Alert, Oriented X 3, No new F.N deficits, Normal affect [place].AT,PERRAL Supple Neck,No JVD, No cervical lymphadenopathy appriciated.  Symmetrical Chest wall movement, Good air movement bilaterally, CTAB RRR,No Gallops,Rubs , + mitral Murmur, No Parasternal Heave +ve B.Sounds, Abd Soft, No tenderness, No organomegaly appriciated, No rebound - guarding or rigidity. No Cyanosis, Clubbing or edema, No new Rash or bruise      Data Review:   Micro Results No results found for this or any previous visit (from the past 240 hour(s)).  Radiology Reports Dg Chest 2 View  05/07/2015  CLINICAL DATA:  Shortness of breath for 1 week EXAM: CHEST  2 VIEW COMPARISON:  May 06, 2015 FINDINGS: Postoperative changes noted on the right. Patient is status post mitral valve replacement. There are areas of mild scarring in both lower lung zones. There is no edema or consolidation. Heart is borderline enlarged with pulmonary vascularity within normal limits. No adenopathy. No bone lesions. IMPRESSION: Stable postoperative change and areas of mild scarring in both lower lung zones. No edema or consolidation. Heart borderline enlarged but stable. Electronically Signed   By: Lowella Grip III M.D.   On: 05/07/2015  09:37   Dg Chest 2 View  05/06/2015  CLINICAL DATA:  Shortness of breath, weakness and cough for 2 days, hypertension EXAM: CHEST  2 VIEW COMPARISON:  05/05/2015 FINDINGS: Enlargement of cardiac silhouette post MVR. Mediastinal contours and pulmonary vascularity normal. Atherosclerotic calcification aorta. Emphysematous and minimal bronchitic changes consistent with COPD. Bibasilar small pleural effusions greater on RIGHT. Minimal basilar atelectasis bilaterally. Upper lungs clear. No pneumothorax. Bones demineralized. IMPRESSION: Enlargement of cardiac silhouette post MVR. COPD changes with bibasilar small pleural effusions and basilar atelectasis. When compared to the previous exam, slightly improved bibasilar aeration. Electronically Signed   By: Lavonia Dana M.D.   On: 05/06/2015 14:28   Dg Chest Port 1 View  05/11/2015  CLINICAL DATA:  Shortness  of Breath EXAM: PORTABLE CHEST 1 VIEW COMPARISON:  05/07/2015 FINDINGS: Cardiac shadow is stable. Postoperative changes are again seen. The lungs are well aerated bilaterally without focal infiltrate or sizable effusion. IMPRESSION: No acute abnormality noted. Electronically Signed   By: Inez Catalina M.D.   On: 05/11/2015 07:39     CBC  Recent Labs Lab 05/06/15 1406 05/07/15 0530 05/10/15 0015 05/11/15 0551  WBC 5.2 6.1 7.2 5.7  HGB 9.4* 9.4* 8.5* 9.4*  HCT 29.4* 28.8* 27.6* 29.3*  PLT 218 236 240 249  MCV 94.5 93.2 92.6 93.3  MCH 30.2 30.4 28.5 29.9  MCHC 32.0 32.6 30.8 32.1  RDW 14.0 14.0 13.7 13.8  LYMPHSABS  --  0.8  --   --   MONOABS  --  0.7  --   --   EOSABS  --  0.5  --   --   BASOSABS  --  0.0  --   --     Chemistries   Recent Labs Lab 05/07/15 0530 05/08/15 0324 05/09/15 0335 05/10/15 0015 05/11/15 0551  NA 141 143 141 141 142  K 3.3* 4.8 4.3 4.7 4.2  CL 104 108 106 106 107  CO2 27 27 28 26 28   GLUCOSE 97 108* 103* 121* 92  BUN 13 16 19  24* 23*  CREATININE 1.50* 1.61* 1.85* 1.87* 1.80*  CALCIUM 8.2* 7.7* 7.9* 7.8*  7.7*  MG  --  2.1  --   --   --   AST 23  --   --   --   --   ALT 20  --   --   --   --   ALKPHOS 77  --   --   --   --   BILITOT 0.8  --   --   --   --    ------------------------------------------------------------------------------------------------------------------ No results for input(s): CHOL, HDL, LDLCALC, TRIG, CHOLHDL, LDLDIRECT in the last 72 hours.  Lab Results  Component Value Date   HGBA1C 5.7* 05/07/2015   ------------------------------------------------------------------------------------------------------------------ No results for input(s): TSH, T4TOTAL, T3FREE, THYROIDAB in the last 72 hours.  Invalid input(s): FREET3 ------------------------------------------------------------------------------------------------------------------ No results for input(s): VITAMINB12, FOLATE, FERRITIN, TIBC, IRON, RETICCTPCT in the last 72 hours.  Coagulation profile  Recent Labs Lab 05/07/15 0530 05/08/15 0324 05/09/15 0335 05/10/15 0015 05/11/15 0551  INR 3.98* 2.86* 2.07* 1.73* 1.49    No results for input(s): DDIMER in the last 72 hours.  Cardiac Enzymes  Recent Labs Lab 05/07/15 0106 05/07/15 0530 05/07/15 1232  TROPONINI 0.03 0.03 0.03   ------------------------------------------------------------------------------------------------------------------    Component Value Date/Time   BNP 656.6* 05/11/2015 0551    Time Spent in minutes  35   Mj Willis K M.D on 05/11/2015 at 9:25 AM  Between 7am to 7pm - Pager - [phone]  After 7pm go to [email or web address] - password [facility]  [facility] -  Office  [phone]

## 2015-05-11 NOTE — Progress Notes (Signed)
Utilization review completed. Adolphus Hanf, RN, BSN. 

## 2015-05-11 NOTE — Progress Notes (Signed)
Patient Name: Robert [name] Date of Encounter: 05/11/2015   SUBJECTIVE  Feeling well. No chest pain, sob or palpitations. It was discoverd that the patient has been drinking extra fluids recently, initially he had good urine output then declined.   CURRENT MEDS . aspirin  81 mg Oral Daily  . atorvastatin  20 mg Oral QHS  . carvedilol  6.25 mg Oral BID WC  . cyanocobalamin  1,000 mcg Intramuscular Daily  . ferrous sulfate  325 mg Oral BID WC  . furosemide  40 mg Intravenous BID  . isosorbide mononitrate  30 mg Oral Daily  . multivitamin with minerals  1 tablet Oral Daily  . sodium chloride flush  3 mL Intravenous Q12H  . tamsulosin  0.4 mg Oral QPC supper  . warfarin  5 mg Oral ONCE-1800  . Warfarin - Pharmacist Dosing Inpatient   Does not apply q1800    OBJECTIVE  Filed Vitals:   05/10/15 1710 05/10/15 1740 05/10/15 1810 05/11/15 0632  BP: 119/53 141/66 123/53 142/61  Pulse: 70 78 67 59  Temp:    98 F (36.7 C)  TempSrc:    Oral  Resp:    16  Height:      Weight:    121 lb 6.4 oz (55.067 kg)  SpO2:    100%    Intake/Output Summary (Last 24 hours) at 05/11/15 0840 Last data filed at 05/11/15 [id number]  Gross per 24 hour  Intake  326.6 ml  Output   1500 ml  Net -1173.4 ml   Filed Weights   05/09/15 0527 05/10/15 0409 05/11/15 [id number]  Weight: 118 lb 12.8 oz (53.887 kg) 121 lb (54.885 kg) 121 lb 6.4 oz (55.067 kg)    PHYSICAL EXAM  General: Pleasant, NAD. Neuro: Alert and oriented X 3. Moves all extremities spontaneously. Psych: Normal affect. HEENT:  Normal  Neck: Supple without bruits or JVD. Lungs:  Resp regular and unlabored, CTA. Heart: RRR no s3, s4, or murmurs. Abdomen: Soft, non-tender, non-distended, BS + x 4.  Extremities: No clubbing, cyanosis or edema. DP/PT/Radials 2+ and equal bilaterally. R radial cath site without  Hematoma or bruit.   Accessory Clinical Findings  CBC  Recent Labs  05/10/15 0015 05/11/15 0551  WBC 7.2 5.7  HGB 8.5*  9.4*  HCT 27.6* 29.3*  MCV 92.6 93.3  PLT 240 0000000   Basic Metabolic Panel  Recent Labs  05/10/15 0015 05/11/15 0551  NA 141 142  K 4.7 4.2  CL 106 107  CO2 26 28  GLUCOSE 121* 92  BUN 24* 23*  CREATININE 1.87* 1.80*  CALCIUM 7.8* 7.7*    TELE  NSR  CAth  Right/Left Heart Cath and Coronary Angiography    Conclusion     Prox RCA lesion, 20% stenosed.  Prox LAD to Mid LAD lesion, 30% stenosed.  Mid LAD lesion, 40% stenosed.  Ost 1st Diag to 1st Diag lesion, 50% stenosed.  Ost 2nd Diag lesion, 30% stenosed.  1. Mild to moderate non-obstructive CAD 2. Normal movement of mechanical mitral valve leaflets.  3. Elevated filling pressures suggesting residual volume overload.   Recommendations: Given his volume overload, would continue diuresis with IV Lasix. Resume coumadin tonight. Heparin drip to resume 8 hours post sheath pull. Medical management of CAD.      Radiology/Studies  Dg Chest 2 View  05/07/2015  CLINICAL DATA:  Shortness of breath for 1 week EXAM: CHEST  2 VIEW COMPARISON:  May 06, 2015 FINDINGS: Postoperative changes  noted on the right. Patient is status post mitral valve replacement. There are areas of mild scarring in both lower lung zones. There is no edema or consolidation. Heart is borderline enlarged with pulmonary vascularity within normal limits. No adenopathy. No bone lesions. IMPRESSION: Stable postoperative change and areas of mild scarring in both lower lung zones. No edema or consolidation. Heart borderline enlarged but stable. Electronically Signed   By: Lowella Grip III M.D.   On: 05/07/2015 09:37   Dg Chest 2 View  05/06/2015  CLINICAL DATA:  Shortness of breath, weakness and cough for 2 days, hypertension EXAM: CHEST  2 VIEW COMPARISON:  05/05/2015 FINDINGS: Enlargement of cardiac silhouette post MVR. Mediastinal contours and pulmonary vascularity normal. Atherosclerotic calcification aorta. Emphysematous and minimal bronchitic  changes consistent with COPD. Bibasilar small pleural effusions greater on RIGHT. Minimal basilar atelectasis bilaterally. Upper lungs clear. No pneumothorax. Bones demineralized. IMPRESSION: Enlargement of cardiac silhouette post MVR. COPD changes with bibasilar small pleural effusions and basilar atelectasis. When compared to the previous exam, slightly improved bibasilar aeration. Electronically Signed   By: Lavonia Dana M.D.   On: 05/06/2015 14:28   Dg Chest Port 1 View  05/11/2015  CLINICAL DATA:  Shortness of Breath EXAM: PORTABLE CHEST 1 VIEW COMPARISON:  05/07/2015 FINDINGS: Cardiac shadow is stable. Postoperative changes are again seen. The lungs are well aerated bilaterally without focal infiltrate or sizable effusion. IMPRESSION: No acute abnormality noted. Electronically Signed   By: Inez Catalina M.D.   On: 05/11/2015 07:39    ASSESSMENT AND PLAN  1. Acute on chronic systolic (congestive) heart failure (HCC) with new systolic impairment  - Echo with inferoapical hypokinesis. Cath showed Mild to moderate non-obstructive CAD. Elevated filling pressures suggesting residual volume overload. Total diuresed to 5L with 15lb weight loss.  - Acute failure likely due to excess fluids intake recently.  - Continue BB, Imdur and lasix. Scr stable. Add hydralazine as needed. Switch to po lasix.   2. CAD - No anginal pain.  - Coronary artery disease with prior documentation of 70% diagonal and 55% LAD. - Mild to moderate non obstructive CAD - Continue ASA, BB, statin. DC ARB due to CKD  3. Acute on chronic kidney disease, stage III - Scr stable  4.  H/O mitral valve replacement with mechanical valve - Heparin to coumadin bridge per pharmacy  5. Chronic anemia - per primary  6. Hypertensive urgency - BP relatively stable.   Dispo:  *Weigh yourself on the same scale at same time of day and keep a log. *Report weight gain of > 2 lbs in 1 day or 5 lbs over the course of a week and/or  symptoms of excess fluid (shortness of breath, difficulty lying flat, swelling, poor appetite, abdominal fullness/bloating, etc) to your doctor immediately. *Avoid foods that are high in sodium (processed, pre-packaged/canned goods, fast foods, etc). *Please attend all scheduled and reccommended follow up appointments       Signed, Bhagat,Bhavinkumar PA-C Pager [phone] The patient has been seen in conjunction with B. Bhagat, PAC. All aspects of care have been considered and discussed. The patient has been personally interviewed, examined, and all clinical data has been reviewed.   No progression of coronary disease was noted. Decrease in LV function is likely multifactorial due to the chronic impact of anemia and volume expansion.  More aggressive diuresis than present as an outpatient will be needed.  Needs outpatient hematology and GI evaluation of anemia with goal of keeping hemoglobin and arrange  that will not cause myocardial stress.  We discussed outpatient Lovenox bridging, but patient and wife are opposed to this idea having previously tried this.

## 2015-05-11 NOTE — Discharge Instructions (Addendum)
Follow with Primary MD WOODYEAR,WYNNE E, MD in 2-3 days   Get CBC, CMP, INR, 2 view Chest X ray checked  by Primary MD next visit.    Activity: As tolerated with Full fall precautions use walker/cane & assistance as needed   Disposition Home     Diet:   Heart Healthy Check your Weight same time everyday, if you gain over 2 pounds, or you develop in leg swelling, experience more shortness of breath or chest pain, call your Primary MD immediately. Follow Cardiac Low Salt Diet and 1.5 lit/day fluid restriction.   On your next visit with your primary care physician please Get Medicines reviewed and adjusted.   Please request your Prim.MD to go over all Hospital Tests and Procedure/Radiological results at the follow up, please get all Hospital records sent to your Prim MD by signing hospital release before you go home.   If you experience worsening of your admission symptoms, develop shortness of breath, life threatening emergency, suicidal or homicidal thoughts you must seek medical attention immediately by calling 911 or calling your MD immediately  if symptoms less severe.  You Must read complete instructions/literature along with all the possible adverse reactions/side effects for all the Medicines you take and that have been prescribed to you. Take any new Medicines after you have completely understood and accpet all the possible adverse reactions/side effects.   Do not drive, operating heavy machinery, perform activities at heights, swimming or participation in water activities or provide baby sitting services if your were admitted for syncope or siezures until you have seen by Primary MD or a Neurologist and advised to do so again.  Do not drive when taking Pain medications.    Do not take more than prescribed Pain, Sleep and Anxiety Medications  Special Instructions: If you have smoked or chewed Tobacco  in the last 2 yrs please stop smoking, stop any regular Alcohol  and or any  Recreational drug use.  Wear Seat belts while driving.   Please note  You were cared for by a hospitalist during your hospital stay. If you have any questions about your discharge medications or the care you received while you were in the hospital after you are discharged, you can call the unit and asked to speak with the hospitalist on call if the hospitalist that took care of you is not available. Once you are discharged, your primary care physician will handle any further medical issues. Please note that NO REFILLS for any discharge medications will be authorized once you are discharged, as it is imperative that you return to your primary care physician (or establish a relationship with a primary care physician if you do not have one) for your aftercare needs so that they can reassess your need for medications and monitor your lab values.     Information on my medicine - Coumadin   (Warfarin)  This medication education was reviewed with me or my healthcare representative as part of my discharge preparation.  The pharmacist that spoke with me during my hospital stay was:  Wayland Salinas, [facility]  Why was Coumadin prescribed for you? Coumadin was prescribed for you because you have a blood clot or a medical condition that can cause an increased risk of forming blood clots. Blood clots can cause serious health problems by blocking the flow of blood to the heart, lung, or brain. Coumadin can prevent harmful blood clots from forming. As a reminder your indication for Coumadin is:   Blood  Clot Prevention After Heart Valve Surgery  What test will check on my response to Coumadin? While on Coumadin (warfarin) you will need to have an INR test regularly to ensure that your dose is keeping you in the desired range. The INR (international normalized ratio) number is calculated from the result of the laboratory test called prothrombin time (PT).  If an INR APPOINTMENT HAS NOT ALREADY BEEN  MADE FOR YOU please schedule an appointment to have this lab work done by your health care provider within 7 days. Your INR goal is usually a number between:  2 to 3 or your provider may give you a more narrow range like 2-2.5.  Ask your health care provider during an office visit what your goal INR is.  What  do you need to  know  About  COUMADIN? Take Coumadin (warfarin) exactly as prescribed by your healthcare provider about the same time each day.  DO NOT stop taking without talking to the doctor who prescribed the medication.  Stopping without other blood clot prevention medication to take the place of Coumadin may increase your risk of developing a new clot or stroke.  Get refills before you run out.  What do you do if you miss a dose? If you miss a dose, take it as soon as you remember on the same day then continue your regularly scheduled regimen the next day.  Do not take two doses of Coumadin at the same time.  Important Safety Information A possible side effect of Coumadin (Warfarin) is an increased risk of bleeding. You should call your healthcare provider right away if you experience any of the following: ? Bleeding from an injury or your nose that does not stop. ? Unusual colored urine (red or dark brown) or unusual colored stools (red or black). ? Unusual bruising for unknown reasons. ? A serious fall or if you hit your head (even if there is no bleeding).  Some foods or medicines interact with Coumadin (warfarin) and might alter your response to warfarin. To help avoid this: ? Eat a balanced diet, maintaining a consistent amount of Vitamin K. ? Notify your provider about major diet changes you plan to make. ? Avoid alcohol or limit your intake to 1 drink for women and 2 drinks for men per day. (1 drink is 5 oz. wine, 12 oz. beer, or 1.5 oz. liquor.)  Make sure that ANY health care provider who prescribes medication for you knows that you are taking Coumadin (warfarin).  Also  make sure the healthcare provider who is monitoring your Coumadin knows when you have started a new medication including herbals and non-prescription products.  Coumadin (Warfarin)  Major Drug Interactions  Increased Warfarin Effect Decreased Warfarin Effect  Alcohol (large quantities) Antibiotics (esp. Septra/Bactrim, Flagyl, Cipro) Amiodarone (Cordarone) Aspirin (ASA) Cimetidine (Tagamet) Megestrol (Megace) NSAIDs (ibuprofen, naproxen, etc.) Piroxicam (Feldene) Propafenone (Rythmol SR) Propranolol (Inderal) Isoniazid (INH) Posaconazole (Noxafil) Barbiturates (Phenobarbital) Carbamazepine (Tegretol) Chlordiazepoxide (Librium) Cholestyramine (Questran) Griseofulvin Oral Contraceptives Rifampin Sucralfate (Carafate) Vitamin K   Coumadin (Warfarin) Major Herbal Interactions  Increased Warfarin Effect Decreased Warfarin Effect  Garlic Ginseng Ginkgo biloba Coenzyme Q10 Green tea St. Johns wort    Coumadin (Warfarin) FOOD Interactions  Eat a consistent number of servings per week of foods HIGH in Vitamin K (1 serving =  cup)  Collards (cooked, or boiled & drained) Kale (cooked, or boiled & drained) Mustard greens (cooked, or boiled & drained) Parsley *serving size only =  cup Spinach (cooked, or  cup °Spinach (cooked, or boiled & drained) °Swiss chard (cooked, or boiled & drained) °Turnip greens (cooked, or boiled & drained)  °Eat a consistent number of servings per week of foods MEDIUM-HIGH in Vitamin K °(1 serving = 1 cup)  °Asparagus (cooked, or boiled & drained) °Broccoli (cooked, boiled & drained, or raw & chopped) °Brussel sprouts (cooked, or boiled & drained) *serving size only = ½ cup °Lettuce, raw (green leaf, endive, romaine) °Spinach, raw °Turnip greens, raw & chopped  ° °These websites have more information on Coumadin (warfarin):  www.coumadin.com; °www.ahrq.gov/consumer/coumadin.htm; ° ° ° ° °

## 2015-05-12 DIAGNOSIS — I5041 Acute combined systolic (congestive) and diastolic (congestive) heart failure: Secondary | ICD-10-CM

## 2015-05-12 LAB — BASIC METABOLIC PANEL
Anion gap: 8 (ref 5–15)
BUN: 24 mg/dL — AB (ref 6–20)
CALCIUM: 7.9 mg/dL — AB (ref 8.9–10.3)
CO2: 28 mmol/L (ref 22–32)
CREATININE: 1.96 mg/dL — AB (ref 0.61–1.24)
Chloride: 104 mmol/L (ref 101–111)
GFR calc Af Amer: 36 mL/min — ABNORMAL LOW (ref >60)
GFR, EST NON AFRICAN AMERICAN: 31 mL/min — AB (ref >60)
GLUCOSE: 125 mg/dL — AB (ref 65–99)
POTASSIUM: 4 mmol/L (ref 3.5–5.1)
SODIUM: 140 mmol/L (ref 135–145)

## 2015-05-12 LAB — CBC
HCT: 32.2 % — ABNORMAL LOW (ref 39.0–52.0)
Hemoglobin: 10 g/dL — ABNORMAL LOW (ref 13.0–17.0)
MCH: 29.1 pg (ref 26.0–34.0)
MCHC: 31.1 g/dL (ref 30.0–36.0)
MCV: 93.6 fL (ref 78.0–100.0)
PLATELETS: 286 10*3/uL (ref 150–400)
RBC: 3.44 MIL/uL — ABNORMAL LOW (ref 4.22–5.81)
RDW: 13.8 % (ref 11.5–15.5)
WBC: 6.5 10*3/uL (ref 4.0–10.5)

## 2015-05-12 LAB — HEPARIN LEVEL (UNFRACTIONATED)
HEPARIN UNFRACTIONATED: 1.03 [IU]/mL — AB (ref 0.30–0.70)
HEPARIN UNFRACTIONATED: 0.79 [IU]/mL — AB (ref 0.30–0.70)

## 2015-05-12 LAB — PROTIME-INR
INR: 1.63 — AB (ref 0.00–1.49)
PROTHROMBIN TIME: 19.3 s — AB (ref 11.6–15.2)

## 2015-05-12 MED ORDER — WARFARIN SODIUM 3 MG PO TABS
6.0000 mg | ORAL_TABLET | Freq: Once | ORAL | Status: AC
  Administered 2015-05-12: 6 mg via ORAL
  Filled 2015-05-12: qty 2

## 2015-05-12 MED ORDER — FUROSEMIDE 40 MG PO TABS: 40.0000 mg | ORAL_TABLET | Freq: Two times a day (BID) | ORAL | Status: DC

## 2015-05-12 MED ORDER — CYANOCOBALAMIN 1000 MCG/ML IJ SOLN
1000.0000 ug | Freq: Every day | INTRAMUSCULAR | Status: DC
  Administered 2015-05-13: 1000 ug via INTRAMUSCULAR
  Filled 2015-05-12 (×2): qty 1

## 2015-05-12 MED ORDER — FUROSEMIDE 40 MG PO TABS
60.0000 mg | ORAL_TABLET | Freq: Every day | ORAL | Status: DC
  Administered 2015-05-13: 60 mg via ORAL
  Filled 2015-05-12: qty 1

## 2015-05-12 NOTE — Clinical Documentation Improvement (Addendum)
Hospitalist and/or Cardiology  (Query responses must be documented in the current medical record, not on the CDI BPA form.) Query 1 of 2 Conflicting documentation is in the current medical record regarding the patient's heart failure.  To assist with accurate code assignment, please clarify what Acuity and Type of Heart Failure is most appropriate for this admission.  Clinical Information: "Acute on chronic combined systolic and diastolic heart failure." documented in progress note 05/12/15.  "This is a pleasant 75 year old male with history of mechanical mitral valve, essential hypertension, anemia of chronic disease, CK D stage IV was admitted to the hospital with acute on chronic systolic heart failure" also documented in progress note 05/12/15.   Echo 05/06/15 - EF 45-50%, unable to evaluate LV diastolic dysfunction   Query 2 of 2 Conflicting documentation is in the current medical record regarding the patient's CKD.  Stage 3 CKD is documented and Stage 4 CKD is also documented.  To assist with accurate code assignment, please clarify the most appropriate Stage of CKD.  Clinical Information/Indicators: BUN/Cr/GFR trend this admission  Component     Latest Ref Rng 05/06/2015 05/07/2015 05/08/2015 05/09/2015  BUN     6 - 20 mg/dL 14 13 16 19   Creatinine     0.61 - 1.24 mg/dL 1.59 (H) 1.50 (H) 1.61 (H) 1.85 (H)  EGFR (Non-African Amer.)     >60 mL/min 41 (L) 43 (L) 40 (L) 34 (L)   Component     Latest Ref Rng 05/10/2015 05/11/2015 05/12/2015  BUN     6 - 20 mg/dL 24 (H) 23 (H) 24 (H)  Creatinine     0.61 - 1.24 mg/dL 1.87 (H) 1.80 (H) 1.96 (H)  EGFR (Non-African Amer.)     >60 mL/min 33 (L) 35 (L) 31 (L)   Please exercise your independent, professional judgment when responding. A specific answer is not anticipated or expected.   Thank You, Erling Conte  RN BSN CCDS [phone] Health Information Management [place]

## 2015-05-12 NOTE — Progress Notes (Signed)
Patient Demographics:    Robert [name], is a 75 y.o. male, DOB - 1940/12/09, [id number]  Admit date - 05/06/2015   Admitting Physician Rise Patience, MD  Outpatient Primary MD for the patient is Burman Freestone, MD  LOS - 6  Summary  This is a pleasant 75 year old male with history of mechanical mitral valve, essential hypertension, anemia of chronic disease, CK D stage IV was admitted to the hospital with acute on chronic systolic heart failure, he had a drop in his EF as compared to a previous echogram along with new wall motion abnormality, cardiology was consulted he underwent a left heart catheterization which showed mild-to-moderate CAD. With diuresis his shortness of breath has completely resolved. He is currently awaiting his INR to touch 2.5 before he can be discharged. Being bridged with heparin for now.  Chief Complaint  Patient presents with  . Shortness of Breath        Subjective:    Robert [name] today has, No headache, No chest pain, No abdominal pain    Assessment  & Plan :     1. Acute on chronic combined systolic and diastolic heart failure. Last EF 60% but has now dropped to 40% with some wall motion abnormality. History of mechanical mitral valve with therapeutic INR.  Seen by cardiology, underwent left heart catheterization showing mild to moderate CAD, clinically he is much improved after diuresis with Lasix. Holding lasix today due to mild increase in cr, plan to resume lasix tomorrow.    2. 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 INR sub therapeutic. Continue with heparin Gtt. Continue with Coumadin.  INR at 1.6.   3. Anemia of chronic disease. Low B-12 as well, placed on B-12 supplementation, iron levels borderline and he is on  oral iron supplementation as well at home, his hemoglobin is between 9-9.5 going back to 2015 , outpatient age-appropriate workup by PCP. Note patient never has had a colonoscopy. Must follow with GI. Check occult blood.  4. Low B-12 levels. Placed on replacement. Two more doses IV.   5. Hypertension.  IV hydralazine as needed. On coreg. .holding ACR due to renal failure.   6. BPH. On Flomax continue.  7. Dyslipidemia. On statin continue.  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  Mildly increase, plan to hold lasix today, resume tomorrow.     Code Status : Full  Family Communication  : wife  Disposition Plan  : Home 1-2 days  Consults  : Cards  Procedures  :   TTE  LVEF 45-50%, inferior hypokinesis, trileaflet sclerotic aorticvalve with mild to moderate regurgitation, tilting bileafletmechanical AVR without obstruction and normal bileaflet motion, mild TR, RVSP 38 mmHG + RAP.  Left heart catheterization 05/10/2015   DVT Prophylaxis  :  Coumadin Lab Results  Component Value Date   INR 1.63* 05/12/2015   INR 1.49 05/11/2015   INR 1.73* 05/10/2015     Lab Results  Component Value Date   PLT 286 05/12/2015    Inpatient Medications  Scheduled Meds: . aspirin  81 mg Oral Daily  . atorvastatin  20 mg Oral QHS  . carvedilol  6.25 mg Oral BID WC  . cyanocobalamin  1,000 mcg Intramuscular Daily  .  ferrous sulfate  325 mg Oral BID WC  . [START ON 05/13/2015] furosemide  40 mg Oral BID  . isosorbide mononitrate  30 mg Oral Daily  . multivitamin with minerals  1 tablet Oral Daily  . sodium chloride flush  3 mL Intravenous Q12H  . tamsulosin  0.4 mg Oral QPC supper  . warfarin  6 mg Oral ONCE-1800  . Warfarin - Pharmacist Dosing Inpatient   Does not apply q1800   Continuous Infusions: . heparin 1,000 Units/hr (05/12/15 0614)   PRN Meds:.sodium chloride, acetaminophen **OR** [DISCONTINUED] acetaminophen, hydrALAZINE, [DISCONTINUED] ondansetron **OR** ondansetron (ZOFRAN)  IV, sodium chloride flush  Antibiotics  :     Anti-infectives    None        Objective:   Filed Vitals:   05/11/15 0632 05/11/15 1131 05/11/15 2153 05/12/15 0546  BP: 142/61 103/53 119/50 144/58  Pulse: 59 53 64 55  Temp: 98 F (36.7 C) 98.1 F (36.7 C) 97.7 F (36.5 C) 97.8 F (36.6 C)  TempSrc: Oral Oral Oral Oral  Resp: 16 18 16 16   Height:      Weight: 55.067 kg (121 lb 6.4 oz)   53.978 kg (119 lb)  SpO2: 100% 100% 97% 98%    Wt Readings from Last 3 Encounters:  05/12/15 53.978 kg (119 lb)     Intake/Output Summary (Last 24 hours) at 05/12/15 1211 Last data filed at 05/11/15 1832  Gross per 24 hour  Intake    240 ml  Output    300 ml  Net    -60 ml     Physical Exam  Awake Alert, Oriented X 3, No new F.N deficits, Normal affect [place].AT,PERRAL Supple Neck,No JVD, No cervical lymphadenopathy appriciated.  Symmetrical Chest wall movement, Good air movement bilaterally, CTAB RRR,No Gallops,Rubs , + mitral Murmur,  +ve B.Sounds, Abd Soft, No tenderness No Cyanosis, Clubbing or edema, No new Rash or bruise      Data Review:   Micro Results No results found for this or any previous visit (from the past 240 hour(s)).  Radiology Reports Dg Chest 2 View  05/07/2015  CLINICAL DATA:  Shortness of breath for 1 week EXAM: CHEST  2 VIEW COMPARISON:  May 06, 2015 FINDINGS: Postoperative changes noted on the right. Patient is status post mitral valve replacement. There are areas of mild scarring in both lower lung zones. There is no edema or consolidation. Heart is borderline enlarged with pulmonary vascularity within normal limits. No adenopathy. No bone lesions. IMPRESSION: Stable postoperative change and areas of mild scarring in both lower lung zones. No edema or consolidation. Heart borderline enlarged but stable. Electronically Signed   By: Lowella Grip III M.D.   On: 05/07/2015 09:37   Dg Chest 2 View  05/06/2015  CLINICAL DATA:  Shortness of breath,  weakness and cough for 2 days, hypertension EXAM: CHEST  2 VIEW COMPARISON:  05/05/2015 FINDINGS: Enlargement of cardiac silhouette post MVR. Mediastinal contours and pulmonary vascularity normal. Atherosclerotic calcification aorta. Emphysematous and minimal bronchitic changes consistent with COPD. Bibasilar small pleural effusions greater on RIGHT. Minimal basilar atelectasis bilaterally. Upper lungs clear. No pneumothorax. Bones demineralized. IMPRESSION: Enlargement of cardiac silhouette post MVR. COPD changes with bibasilar small pleural effusions and basilar atelectasis. When compared to the previous exam, slightly improved bibasilar aeration. Electronically Signed   By: Lavonia Dana M.D.   On: 05/06/2015 14:28   Dg Chest Port 1 View  05/11/2015  CLINICAL DATA:  Shortness of Breath EXAM: PORTABLE CHEST  1 VIEW COMPARISON:  05/07/2015 FINDINGS: Cardiac shadow is stable. Postoperative changes are again seen. The lungs are well aerated bilaterally without focal infiltrate or sizable effusion. IMPRESSION: No acute abnormality noted. Electronically Signed   By: Inez Catalina M.D.   On: 05/11/2015 07:39     CBC  Recent Labs Lab 05/06/15 1406 05/07/15 0530 05/10/15 0015 05/11/15 0551 05/12/15 0314  WBC 5.2 6.1 7.2 5.7 6.5  HGB 9.4* 9.4* 8.5* 9.4* 10.0*  HCT 29.4* 28.8* 27.6* 29.3* 32.2*  PLT 218 236 240 249 286  MCV 94.5 93.2 92.6 93.3 93.6  MCH 30.2 30.4 28.5 29.9 29.1  MCHC 32.0 32.6 30.8 32.1 31.1  RDW 14.0 14.0 13.7 13.8 13.8  LYMPHSABS  --  0.8  --   --   --   MONOABS  --  0.7  --   --   --   EOSABS  --  0.5  --   --   --   BASOSABS  --  0.0  --   --   --     Chemistries   Recent Labs Lab 05/07/15 0530 05/08/15 0324 05/09/15 0335 05/10/15 0015 05/11/15 0551 05/12/15 0314  NA 141 143 141 141 142 140  K 3.3* 4.8 4.3 4.7 4.2 4.0  CL 104 108 106 106 107 104  CO2 27 27 28 26 28 28   GLUCOSE 97 108* 103* 121* 92 125*  BUN 13 16 19  24* 23* 24*  CREATININE 1.50* 1.61* 1.85* 1.87*  1.80* 1.96*  CALCIUM 8.2* 7.7* 7.9* 7.8* 7.7* 7.9*  MG  --  2.1  --   --   --   --   AST 23  --   --   --   --   --   ALT 20  --   --   --   --   --   ALKPHOS 77  --   --   --   --   --   BILITOT 0.8  --   --   --   --   --    ------------------------------------------------------------------------------------------------------------------ No results for input(s): CHOL, HDL, LDLCALC, TRIG, CHOLHDL, LDLDIRECT in the last 72 hours.  Lab Results  Component Value Date   HGBA1C 5.7* 05/07/2015   ------------------------------------------------------------------------------------------------------------------ No results for input(s): TSH, T4TOTAL, T3FREE, THYROIDAB in the last 72 hours.  Invalid input(s): FREET3 ------------------------------------------------------------------------------------------------------------------ No results for input(s): VITAMINB12, FOLATE, FERRITIN, TIBC, IRON, RETICCTPCT in the last 72 hours.  Coagulation profile  Recent Labs Lab 05/08/15 0324 05/09/15 0335 05/10/15 0015 05/11/15 0551 05/12/15 0314  INR 2.86* 2.07* 1.73* 1.49 1.63*    No results for input(s): DDIMER in the last 72 hours.  Cardiac Enzymes  Recent Labs Lab 05/07/15 0106 05/07/15 0530 05/07/15 1232  TROPONINI 0.03 0.03 0.03   ------------------------------------------------------------------------------------------------------------------    Component Value Date/Time   BNP 656.6* 05/11/2015 0551    Time Spent in minutes  35   Shawanna Zanders A M.D on 05/12/2015 at 12:11 PM [phone] Between 7am to 7pm - Pager - [phone]  After 7pm go to [email or web address] - password [facility]  [facility] -  Office  [phone]

## 2015-05-12 NOTE — Progress Notes (Signed)
Pt a/o, no c/o pain, remains on heparin gtt @ 12 ml/hr, VSS, pt stable, wife at bedside

## 2015-05-12 NOTE — Progress Notes (Addendum)
Patient Name: Robert [name] Date of Encounter: 05/12/2015  Principal Problem:   Acute on chronic systolic (congestive) heart failure (HCC) Active Problems:   Hypertensive urgency   CKD (chronic kidney disease) stage 3, GFR 30-59 ml/min   Chronic anemia   H/O mitral valve replacement with mechanical valve   CHF (congestive heart failure) (HCC)   Coronary artery disease involving native coronary artery of native heart without angina pectoris   Primary Cardiologist: Dr Debara Pickett (pt wishes to transfer care)  Patient Profile: 75 yo male w/ hx mech MVR 2013, chronic coumadin, HTN, CKD, HLD, BPH, anemia. Admitted 02/24 w/ SOB, CHF  SUBJECTIVE: Breathing fine, waiting for coumadin to become therapeutic.   OBJECTIVE Filed Vitals:   05/11/15 0632 05/11/15 1131 05/11/15 2153 05/12/15 0546  BP: 142/61 103/53 119/50 144/58  Pulse: 59 53 64 55  Temp: 98 F (36.7 C) 98.1 F (36.7 C) 97.7 F (36.5 C) 97.8 F (36.6 C)  TempSrc: Oral Oral Oral Oral  Resp: 16 18 16 16   Height:      Weight: 121 lb 6.4 oz (55.067 kg)   119 lb (53.978 kg)  SpO2: 100% 100% 97% 98%    Intake/Output Summary (Last 24 hours) at 05/12/15 0910 Last data filed at 05/11/15 1832  Gross per 24 hour  Intake    240 ml  Output    875 ml  Net   -635 ml   Filed Weights   05/10/15 0409 05/11/15 0632 05/12/15 0546  Weight: 121 lb (54.885 kg) 121 lb 6.4 oz (55.067 kg) 119 lb (53.978 kg)    PHYSICAL EXAM General: Well developed, well nourished, male in no acute distress. Head: Normocephalic, atraumatic.  Neck: Supple without bruits, JVD 8 cm. Lungs:  Resp regular and unlabored, slightly decreased BS bases. Heart: RRR, S1, S2, no S3, S4, crisp valve click, no sig murmur; no rub. Abdomen: Soft, non-tender, non-distended, BS + x 4.  Extremities: No clubbing, cyanosis, edema.  Neuro: Alert and oriented X 3. Moves all extremities spontaneously. Psych: Normal affect.  LABS: CBC: Recent Labs  05/11/15 0551  05/12/15 0314  WBC 5.7 6.5  HGB 9.4* 10.0*  HCT 29.3* 32.2*  MCV 93.3 93.6  PLT 249 286   INR: Recent Labs  05/12/15 0314  INR 0000000*   Basic Metabolic Panel: Recent Labs  05/11/15 0551 05/12/15 0314  NA 142 140  K 4.2 4.0  CL 107 104  CO2 28 28  GLUCOSE 92 125*  BUN 23* 24*  CREATININE 1.80* 1.96*  CALCIUM 7.7* 7.9*   BNP:  B NATRIURETIC PEPTIDE  Date/Time Value Ref Range Status  05/11/2015 05:51 AM 656.6* 0.0 - 100.0 pg/mL Final  05/06/2015 02:06 PM 1406.9* 0.0 - 100.0 pg/mL Final   TELE: SR     Radiology/Studies: Dg Chest Port 1 View  05/11/2015  CLINICAL DATA:  Shortness of Breath EXAM: PORTABLE CHEST 1 VIEW COMPARISON:  05/07/2015 FINDINGS: Cardiac shadow is stable. Postoperative changes are again seen. The lungs are well aerated bilaterally without focal infiltrate or sizable effusion. IMPRESSION: No acute abnormality noted. Electronically Signed   By: Inez Catalina M.D.   On: 05/11/2015 07:39     Current Medications:  . aspirin  81 mg Oral Daily  . atorvastatin  20 mg Oral QHS  . carvedilol  6.25 mg Oral BID WC  . cyanocobalamin  1,000 mcg Intramuscular Daily  . ferrous sulfate  325 mg Oral BID WC  . furosemide  40 mg Oral  BID  . isosorbide mononitrate  30 mg Oral Daily  . multivitamin with minerals  1 tablet Oral Daily  . sodium chloride flush  3 mL Intravenous Q12H  . tamsulosin  0.4 mg Oral QPC supper  . Warfarin - Pharmacist Dosing Inpatient   Does not apply q1800   . heparin 1,000 Units/hr (05/12/15 [id number])    ASSESSMENT AND PLAN: 1. Acute on chronic systolic (congestive) heart failure (HCC) with new systolic impairment  - Echo with inferoapical hypokinesis. Cath showed Mild to moderate non-obstructive CAD. Elevated filling pressures suggesting residual volume overload. Total diuresed to 5.7L with 17lb weight loss.  - Acute failure likely due to excess fluids intake recently.  - Continue BB, Imdur and lasix. Cr stable. Add hydralazine if  needed.  - Switched to po lasix 02/28, but with BUN/Cr increasing, will hold today.  *Weigh yourself on the same scale at same time of day and keep a log. *Report weight gain of > 2 lbs in 1 day or 5 lbs over the course of a week and/or symptoms of excess fluid (shortness of breath, difficulty lying flat, swelling, poor appetite, abdominal fullness/bloating, etc) to your doctor immediately. *Avoid foods that are high in sodium (processed, pre-packaged/canned goods, fast foods, etc). *Please attend all scheduled and reccommended follow up appointments   2. CAD - No anginal pain.  - Coronary artery disease with prior documentation of 70% diagonal and 55% LAD. - Mild to moderate non obstructive CAD - Continue ASA, BB, statin. DC ARB due to CKD  3. Acute on chronic kidney disease, stage III - Scr stable  4. H/O mitral valve replacement with mechanical valve - Heparin to coumadin bridge per pharmacy  5. Chronic anemia - per primary  6. Hypertensive urgency - BP relatively stable w/ SBP 103-144 last 24 hours  Otherwise, per IM Principal Problem:   Acute on chronic systolic (congestive) heart failure (HCC) Active Problems:   Hypertensive urgency   CKD (chronic kidney disease) stage 3, GFR 30-59 ml/min   Chronic anemia   H/O mitral valve replacement with mechanical valve   CHF (congestive heart failure) (HCC)   Coronary artery disease involving native coronary artery of native heart without angina pectoris   Signed, Barrett, Rhonda , PA-C 9:10 AM 05/12/2015    The patient has been seen in conjunction with Rosaria Ferries, PA-C. All aspects of care have been considered and discussed. The patient has been personally interviewed, examined, and all clinical data has been reviewed.   The patient is very stable.  Slight bump in BUN and creatinine related to diuresis. With diuresis hemoglobin has gradually increased to 10.0. Agree with holding diuretic therapy today and resuming  therapy tomorrow at 60 mg daily rather than 40 mg twice a day.  Eligible for discharge when INR is greater than 2.0.  Will need early follow-up to Coumadin clinic and outpatient basic metabolic panel within 7 days of discharge.  Teach 2 g sodium restriction

## 2015-05-12 NOTE — Progress Notes (Signed)
ANTICOAGULATION CONSULT NOTE - Follow Up Consult  Pharmacy Consult for heparin Indication: MVR   Labs:  Recent Labs  05/10/15 0015 05/10/15 0856 05/11/15 0551 05/12/15 0314  HGB 8.5*  --  9.4* 10.0*  HCT 27.6*  --  29.3* 32.2*  PLT 240  --  249 286  LABPROT 20.2*  --  18.1* 19.3*  INR 1.73*  --  1.49 1.63*  HEPARINUNFRC 0.12* 0.42 0.47 1.03*  CREATININE 1.87*  --  1.80*  --      Assessment: 75yo male supratherapeutic on heparin after resumed post-cath.  Goal of Therapy:  Heparin level 0.3-0.7 units/ml   Plan:  Will decrease heparin gtt by 4 units/kg/hr to 1000 units/hr and check level in [place], PharmD, BCPS  05/12/2015,5:46 AM

## 2015-05-12 NOTE — Progress Notes (Signed)
ANTICOAGULATION CONSULT NOTE - Follow Up Consult  Pharmacy Consult for Heparin and Coumadin Indication: MVR  Allergies  Allergen Reactions  . Penicillins Other (See Comments)  . Vancomycin Other (See Comments)    Kidney problems    Patient Measurements: Height: 5\' 6"  (167.6 cm) Weight: 119 lb (53.978 kg) (Scale A) IBW/kg (Calculated) : 63.8 Heparin Dosing Weight: 54 kg  Vital Signs: Temp: 98.4 F (36.9 C) (03/01 1211) Temp Source: Oral (03/01 1211) BP: 105/75 mmHg (03/01 1211) Pulse Rate: 57 (03/01 1211)  Labs:  Recent Labs  05/10/15 0015  05/11/15 0551 05/12/15 0314 05/12/15 1332  HGB 8.5*  --  9.4* 10.0*  --   HCT 27.6*  --  29.3* 32.2*  --   PLT 240  --  249 286  --   LABPROT 20.2*  --  18.1* 19.3*  --   INR 1.73*  --  1.49 1.63*  --   HEPARINUNFRC 0.12*  < > 0.47 1.03* 0.79*  CREATININE 1.87*  --  1.80* 1.96*  --   < > = values in this interval not displayed.  Estimated Creatinine Clearance: 24.5 mL/min (by C-G formula based on Cr of 1.96).  Assessment:   On Coumadin prior to admission for mechanical MVR.     Home Coumadin regimen: 4.5 mg MWThSat, 3 mg Sun-Tues-Fri.  INR 4.37 on admit 2/23.  Coumadin held for 4 days, until INR down to 1.73  for cath.  Coumadin resumed post-cath 2/27, but INR dropped again on 2/28. INR now trending up again, 1.63 today. Has had Coumadin 5 mg x 2 days.     On heparin bridge since 2/26.   Heparin level this afternoon is just above target range (0.79) on 1000 units/hr.   Goal of Therapy:  Heparin level 0.3-0.7 units/ml INR 2.5-3.5 Monitor platelets by anticoagulation protocol: Yes   Plan:   Decrease heparin drip to 950 units/hr.  Coumadin 6 mg x 1 today.  Discussed with patient.  Heparin level, PT/INR and CBC in am.  Arty Baumgartner, [place] Pager: [phone] 05/12/2015,3:00 PM

## 2015-05-13 ENCOUNTER — Other Ambulatory Visit: Payer: Self-pay | Admitting: Physician Assistant

## 2015-05-13 DIAGNOSIS — N179 Acute kidney failure, unspecified: Secondary | ICD-10-CM

## 2015-05-13 DIAGNOSIS — Z7901 Long term (current) use of anticoagulants: Secondary | ICD-10-CM

## 2015-05-13 LAB — CBC
HEMATOCRIT: 29 % — AB (ref 39.0–52.0)
HEMOGLOBIN: 8.9 g/dL — AB (ref 13.0–17.0)
MCH: 28.6 pg (ref 26.0–34.0)
MCHC: 30.7 g/dL (ref 30.0–36.0)
MCV: 93.2 fL (ref 78.0–100.0)
Platelets: 243 10*3/uL (ref 150–400)
RBC: 3.11 MIL/uL — ABNORMAL LOW (ref 4.22–5.81)
RDW: 13.9 % (ref 11.5–15.5)
WBC: 5.4 10*3/uL (ref 4.0–10.5)

## 2015-05-13 LAB — BASIC METABOLIC PANEL
ANION GAP: 5 (ref 5–15)
BUN: 23 mg/dL — AB (ref 6–20)
CALCIUM: 7.8 mg/dL — AB (ref 8.9–10.3)
CO2: 26 mmol/L (ref 22–32)
Chloride: 110 mmol/L (ref 101–111)
Creatinine, Ser: 1.82 mg/dL — ABNORMAL HIGH (ref 0.61–1.24)
GFR calc Af Amer: 40 mL/min — ABNORMAL LOW (ref >60)
GFR calc non Af Amer: 34 mL/min — ABNORMAL LOW (ref >60)
GLUCOSE: 120 mg/dL — AB (ref 65–99)
POTASSIUM: 4.2 mmol/L (ref 3.5–5.1)
Sodium: 141 mmol/L (ref 135–145)

## 2015-05-13 LAB — PROTIME-INR
INR: 2.02 — ABNORMAL HIGH (ref 0.00–1.49)
Prothrombin Time: 22.7 seconds — ABNORMAL HIGH (ref 11.6–15.2)

## 2015-05-13 LAB — HEPARIN LEVEL (UNFRACTIONATED): HEPARIN UNFRACTIONATED: 0.41 [IU]/mL (ref 0.30–0.70)

## 2015-05-13 LAB — OCCULT BLOOD X 1 CARD TO LAB, STOOL: FECAL OCCULT BLD: NEGATIVE

## 2015-05-13 MED ORDER — ISOSORBIDE MONONITRATE ER 30 MG PO TB24: 30.0000 mg | ORAL_TABLET | Freq: Every day | ORAL | Status: DC

## 2015-05-13 MED ORDER — FERROUS SULFATE 325 (65 FE) MG PO TABS: 325.0000 mg | ORAL_TABLET | Freq: Two times a day (BID) | ORAL | Status: AC

## 2015-05-13 MED ORDER — FUROSEMIDE 20 MG PO TABS: 60.0000 mg | ORAL_TABLET | Freq: Every day | ORAL | Status: DC

## 2015-05-13 MED ORDER — VITAMIN B-12 1000 MCG PO TABS: 1000.0000 ug | ORAL_TABLET | Freq: Every day | ORAL | Status: AC

## 2015-05-13 MED ORDER — CARVEDILOL 6.25 MG PO TABS: 6.2500 mg | ORAL_TABLET | Freq: Two times a day (BID) | ORAL | Status: DC

## 2015-05-13 NOTE — Discharge Summary (Signed)
Physician Discharge Summary  Robert [name] WIO:[id number] DOB: 29-Oct-1940 DOA: 05/06/2015  PCP: Burman Freestone, MD  Admit date: 05/06/2015 Discharge date: 05/13/2015  Time spent: 35 minutes  Recommendations for Outpatient Follow-up:  Needs B-met to follow renal function.  Needs INR and adjust coumadin as needed.   Discharge Diagnoses:    Acute on chronic systolic (congestive) heart failure (HCC)   Hypertensive urgency   CKD (chronic kidney disease) stage 3, GFR 30-59 ml/min   Chronic anemia   H/O mitral valve replacement with mechanical valve   CHF (congestive heart failure) (HCC)   Coronary artery disease involving native coronary artery of native heart without angina pectoris   Discharge Condition: stable  Diet recommendation: heart healthy  Filed Weights   05/11/15 0632 05/12/15 0546 05/13/15 4268  Weight: 55.067 kg (121 lb 6.4 oz) 53.978 kg (119 lb) 55.1 kg (121 lb 7.6 oz)    History of present illness:  Robert [name] is a 75 y.o. male with history of mechanical mitral valve, hypertension, chronic anemia chronic disease and history of gastric ulcer presents to the ER because of worsening shortness of breath. Patient has been getting increasingly short of breath on exertion over the last 4-5 days and patient's primary care physician had placed patient on Lasix first dose of which patient took yesterday. Despite taking which patient was still short of breath and had gone to the urgent care yesterday and was given spironolactone. Patient became acutely short of breath and presented to the ER. Chest x-ray shows pulmonary edema and patient's blood pressure was significantly elevated with systolic blood pressure more than 200. Patient denies any chest pain fever chills have some productive cough. Patient has been admitted for acute pulmonary edema and hypertensive urgency.   Hospital Course:  1. Acute on chronic combined systolic and diastolic heart failure. Last EF 60% but has  now dropped to 40% with some wall motion abnormality. History of mechanical mitral valve with therapeutic INR.  Seen by cardiology, underwent left heart catheterization showing mild to moderate CAD, clinically he is much improved after diuresis with Lasix. Plan to discharge home today, resume lasix.    2. 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 INR sub therapeutic. Continue with heparin Gtt. Continue with Coumadin. INR at 1.6.   3. Anemia of chronic disease. Low B-12 as well, placed on B-12 supplementation, iron levels borderline and he is on oral iron supplementation as well at home, his hemoglobin is between 9-9.5 going back to 2015 , outpatient age-appropriate workup by PCP. Note patient never has had a colonoscopy. Must follow with GI.  occult blood negative.   4. Low B-12 levels. Placed on replacement. Discharge on oral supplementation. Needs further prescriptions.   5. Hypertension. IV hydralazine as needed. On coreg. .holding ACR due to renal failure.   6. BPH. On Flomax continue.  7. Dyslipidemia. On statin continue.  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  Cr stable, plan to resume lasix today.    Procedures:  Cath; Prox RCA lesion, 20% stenosed.  Prox LAD to Mid LAD lesion, 30% stenosed.  Mid LAD lesion, 40% stenosed.  Ost 1st Diag to 1st Diag lesion, 50% stenosed.  Ost 2nd Diag lesion, 30% stenosed.  1. Mild to moderate non-obstructive CAD 2. Normal movement of mechanical mitral valve leaflets.   3. Elevated filling pressures suggesting residual volume overload.   Consultations:  CArdiology  Discharge Exam: Filed Vitals:   05/13/15 3419 05/13/15 0831  BP:  140/62 149/73  Pulse: 62 62  Temp: 97.6 F (36.4 C)   Resp: 19     General: NAD Cardiovascular: S 1, S 2 RRR Respiratory: CTA  Discharge Instructions   Discharge Instructions    Diet - low sodium heart healthy    Complete by:  As directed      Increase activity  slowly    Complete by:  As directed           Current Discharge Medication List    START taking these medications   Details  carvedilol (COREG) 6.25 MG tablet Take 1 tablet (6.25 mg total) by mouth 2 (two) times daily with a meal. Qty: 60 tablet, Refills: 0    isosorbide mononitrate (IMDUR) 30 MG 24 hr tablet Take 1 tablet (30 mg total) by mouth daily. Qty: 30 tablet, Refills: 0      CONTINUE these medications which have CHANGED   Details  ferrous sulfate 325 (65 FE) MG tablet Take 1 tablet (325 mg total) by mouth 2 (two) times daily with a meal. Qty: 30 tablet, Refills: 3    furosemide (LASIX) 20 MG tablet Take 3 tablets (60 mg total) by mouth daily. Qty: 30 tablet, Refills: 0      CONTINUE these medications which have NOT CHANGED   Details  aspirin 81 MG chewable tablet Chew 81 mg by mouth daily.    atorvastatin (LIPITOR) 20 MG tablet Take 20 mg by mouth at bedtime.    Multiple Vitamin (MULTIVITAMIN WITH MINERALS) TABS tablet Take 1 tablet by mouth daily.    tamsulosin (FLOMAX) 0.4 MG CAPS capsule Take 0.4 mg by mouth daily after supper.    warfarin (COUMADIN) 3 MG tablet Take 3 mg by mouth See admin instructions. Take 1 and 1/2 tablets every day except take 1 tablet on Sunday, Tuesday and Friday      STOP taking these medications     spironolactone (ALDACTONE) 25 MG tablet      valsartan (DIOVAN) 80 MG tablet        Allergies  Allergen Reactions  . Penicillins Other (See Comments)  . Vancomycin Other (See Comments)    Kidney problems   Follow-up Information    Follow up with [facility], MD. Schedule an appointment as soon as possible for a visit in 1 week.   Specialty:  Internal Medicine   Contact information:   [address] [place] [address] [phone]       Follow up with Pixie Casino, MD. Go on 05/26/2015.   Specialty:  Cardiology   Why:  @3 :45 for post hospital    Contact information:   [place] [address] [phone]       Follow up with [facility]. Go on 05/18/2015.   Specialty:  Cardiology   Why:  @8 :30am for INR check with pharmacist and BMET to check kidney function   Contact information:   [address] [place] [phone]       The results of significant diagnostics from this hospitalization (including imaging, microbiology, ancillary and laboratory) are listed below for reference.    Significant Diagnostic Studies: Dg Chest 2 View  05/07/2015  CLINICAL DATA:  Shortness of breath for 1 week EXAM: CHEST  2 VIEW COMPARISON:  May 06, 2015 FINDINGS: Postoperative changes noted on the right. Patient is status post mitral valve replacement. There are areas of mild scarring in both lower lung  zones. There is no edema or consolidation. Heart is borderline enlarged with pulmonary vascularity within normal limits. No adenopathy. No bone lesions. IMPRESSION: Stable postoperative change and areas of mild scarring in both lower lung zones. No edema or consolidation. Heart borderline enlarged but stable. Electronically Signed   By: Lowella Grip III M.D.   On: 05/07/2015 09:37   Dg Chest 2 View  05/06/2015  CLINICAL DATA:  Shortness of breath, weakness and cough for 2 days, hypertension EXAM: CHEST  2 VIEW COMPARISON:  05/05/2015 FINDINGS: Enlargement of cardiac silhouette post MVR. Mediastinal contours and pulmonary vascularity normal. Atherosclerotic calcification aorta. Emphysematous and minimal bronchitic changes consistent with COPD. Bibasilar small pleural effusions greater on RIGHT. Minimal basilar atelectasis bilaterally. Upper lungs clear. No pneumothorax. Bones demineralized. IMPRESSION: Enlargement of cardiac silhouette post MVR. COPD changes with bibasilar small pleural effusions and basilar atelectasis. When compared to the previous exam, slightly improved bibasilar aeration. Electronically Signed   By:  Lavonia Dana M.D.   On: 05/06/2015 14:28   Dg Chest Port 1 View  05/11/2015  CLINICAL DATA:  Shortness of Breath EXAM: PORTABLE CHEST 1 VIEW COMPARISON:  05/07/2015 FINDINGS: Cardiac shadow is stable. Postoperative changes are again seen. The lungs are well aerated bilaterally without focal infiltrate or sizable effusion. IMPRESSION: No acute abnormality noted. Electronically Signed   By: Inez Catalina M.D.   On: 05/11/2015 07:39    Microbiology: No results found for this or any previous visit (from the past 240 hour(s)).   Labs: Basic Metabolic Panel:  Recent Labs Lab 05/08/15 0324 05/09/15 0335 05/10/15 0015 05/11/15 0551 05/12/15 0314 05/13/15 0340  NA 143 141 141 142 140 141  K 4.8 4.3 4.7 4.2 4.0 4.2  CL 108 106 106 107 104 110  CO2 27 28 26 28 28 26   GLUCOSE 108* 103* 121* 92 125* 120*  BUN 16 19 24* 23* 24* 23*  CREATININE 1.61* 1.85* 1.87* 1.80* 1.96* 1.82*  CALCIUM 7.7* 7.9* 7.8* 7.7* 7.9* 7.8*  MG 2.1  --   --   --   --   --    Liver Function Tests:  Recent Labs Lab 05/07/15 0530  AST 23  ALT 20  ALKPHOS 77  BILITOT 0.8  PROT 6.2*  ALBUMIN 3.0*   No results for input(s): LIPASE, AMYLASE in the last 168 hours. No results for input(s): AMMONIA in the last 168 hours. CBC:  Recent Labs Lab 05/07/15 0530 05/10/15 0015 05/11/15 0551 05/12/15 0314 05/13/15 0340  WBC 6.1 7.2 5.7 6.5 5.4  NEUTROABS 4.1  --   --   --   --   HGB 9.4* 8.5* 9.4* 10.0* 8.9*  HCT 28.8* 27.6* 29.3* 32.2* 29.0*  MCV 93.2 92.6 93.3 93.6 93.2  PLT 236 240 249 286 243   Cardiac Enzymes:  Recent Labs Lab 05/07/15 0106 05/07/15 0530 05/07/15 1232  TROPONINI 0.03 0.03 0.03   BNP: BNP (last 3 results)  Recent Labs  05/06/15 1406 05/11/15 0551  BNP 1406.9* 656.6*    ProBNP (last 3 results) No results for input(s): PROBNP in the last 8760 hours.  CBG: No results for input(s): GLUCAP in the last 168 hours.     Signed:  Elmarie Shiley MD.  [facility] 05/13/2015, 9:38 AM

## 2015-05-13 NOTE — Progress Notes (Signed)
Patient Name: Robert [name] Date of Encounter: 05/13/2015   SUBJECTIVE  Feeling well. No chest pain, sob or palpitations.   CURRENT MEDS . aspirin  81 mg Oral Daily  . atorvastatin  20 mg Oral QHS  . carvedilol  6.25 mg Oral BID WC  . cyanocobalamin  1,000 mcg Intramuscular Daily  . ferrous sulfate  325 mg Oral BID WC  . furosemide  60 mg Oral Daily  . isosorbide mononitrate  30 mg Oral Daily  . multivitamin with minerals  1 tablet Oral Daily  . sodium chloride flush  3 mL Intravenous Q12H  . tamsulosin  0.4 mg Oral QPC supper  . Warfarin - Pharmacist Dosing Inpatient   Does not apply q1800    OBJECTIVE  Filed Vitals:   05/12/15 1211 05/12/15 2048 05/13/15 0613 05/13/15 0831  BP: 105/75 138/67 140/62 149/73  Pulse: 57 68 62 62  Temp: 98.4 F (36.9 C) 98.6 F (37 C) 97.6 F (36.4 C)   TempSrc: Oral Oral Oral   Resp: 16 16 19    Height:      Weight:   121 lb 7.6 oz (55.1 kg)   SpO2: 100% 98% 100%     Intake/Output Summary (Last 24 hours) at 05/13/15 0857 Last data filed at 05/13/15 0616  Gross per 24 hour  Intake 238.77 ml  Output    400 ml  Net -161.23 ml   Filed Weights   05/11/15 0632 05/12/15 0546 05/13/15 0613  Weight: 121 lb 6.4 oz (55.067 kg) 119 lb (53.978 kg) 121 lb 7.6 oz (55.1 kg)    PHYSICAL EXAM  General: Pleasant, NAD. Neuro: Alert and oriented X 3. Moves all extremities spontaneously. Psych: Normal affect. HEENT:  Normal  Neck: Supple without bruits or JVD. Lungs:  Resp regular and unlabored, CTA. Heart: RRR no s3, s4, or murmurs. Abdomen: Soft, non-tender, non-distended, BS + x 4.  Extremities: No clubbing, cyanosis or edema. DP/PT/Radials 2+ and equal bilaterally.  Accessory Clinical Findings  CBC  Recent Labs  05/12/15 0314 05/13/15 0340  WBC 6.5 5.4  HGB 10.0* 8.9*  HCT 32.2* 29.0*  MCV 93.6 93.2  PLT 286 0000000   Basic Metabolic Panel  Recent Labs  05/12/15 0314 05/13/15 0340  NA 140 141  K 4.0 4.2  CL 104 110   CO2 28 26  GLUCOSE 125* 120*  BUN 24* 23*  CREATININE 1.96* 1.82*  CALCIUM 7.9* 7.8*   TELE  Sinus rhythm   Radiology/Studies  Dg Chest 2 View  05/07/2015  CLINICAL DATA:  Shortness of breath for 1 week EXAM: CHEST  2 VIEW COMPARISON:  May 06, 2015 FINDINGS: Postoperative changes noted on the right. Patient is status post mitral valve replacement. There are areas of mild scarring in both lower lung zones. There is no edema or consolidation. Heart is borderline enlarged with pulmonary vascularity within normal limits. No adenopathy. No bone lesions. IMPRESSION: Stable postoperative change and areas of mild scarring in both lower lung zones. No edema or consolidation. Heart borderline enlarged but stable. Electronically Signed   By: Lowella Grip III M.D.   On: 05/07/2015 09:37   Dg Chest 2 View  05/06/2015  CLINICAL DATA:  Shortness of breath, weakness and cough for 2 days, hypertension EXAM: CHEST  2 VIEW COMPARISON:  05/05/2015 FINDINGS: Enlargement of cardiac silhouette post MVR. Mediastinal contours and pulmonary vascularity normal. Atherosclerotic calcification aorta. Emphysematous and minimal bronchitic changes consistent with COPD. Bibasilar small pleural effusions greater on RIGHT.  Minimal basilar atelectasis bilaterally. Upper lungs clear. No pneumothorax. Bones demineralized. IMPRESSION: Enlargement of cardiac silhouette post MVR. COPD changes with bibasilar small pleural effusions and basilar atelectasis. When compared to the previous exam, slightly improved bibasilar aeration. Electronically Signed   By: Lavonia Dana M.D.   On: 05/06/2015 14:28   Dg Chest Port 1 View  05/11/2015  CLINICAL DATA:  Shortness of Breath EXAM: PORTABLE CHEST 1 VIEW COMPARISON:  05/07/2015 FINDINGS: Cardiac shadow is stable. Postoperative changes are again seen. The lungs are well aerated bilaterally without focal infiltrate or sizable effusion. IMPRESSION: No acute abnormality noted. Electronically  Signed   By: Inez Catalina M.D.   On: 05/11/2015 07:39    ASSESSMENT AND PLAN  1. Acute on chronic systolic (congestive) heart failure (HCC) with new systolic impairment  - Echo with inferoapical hypokinesis. Cath showed Mild to moderate non-obstructive CAD. Elevated filling pressures suggesting residual volume overload. Total diuresed to 5.8L with 15lb weight loss (136-->121) - Acute failure likely due to excess fluids intake recently.  - Continue BB, Imdur and lasix. Cr imrpoved. Add hydralazine if needed.  - held diuretics yesterday. Resume lasix po 60mg  daily today. -   2. CAD - No anginal pain.  - Coronary artery disease with prior documentation of 70% diagonal and 55% LAD. - Mild to moderate non obstructive CAD - Continue ASA, BB, statin. DC ARB due to CKD  3. Acute on chronic kidney disease, stage III - Scr improved  4. H/O mitral valve replacement with mechanical valve - Heparin to coumadin bridge per pharmacy  5. Chronic anemia - per primary  6. Hypertensive urgency - BP relatively stable   Dispo: INR of 2.02 today. Ok to discharge. Coumadin dose per pharmacy. Recheck BMET and INR 3/7 at [facility] office. Appointmentshas been made. F/u with Dr. Debara Pickett 3/15.   Signed, Leanor Kail PA-C Pager [phone]  The patient has been seen in conjunction with B. Bhagat, PAC. All aspects of care have been considered and discussed. The patient has been personally interviewed, examined, and all clinical data has been reviewed.   The INR is now sufficient to discontinue IV heparin and to resume the patient's usual outpatient Coumadin regimen.  He is eligible for discharge with follow-up to see Dr. Debara Pickett on 05/26/15.  He should come to the Coumadin clinic at our [facility] office as noted above on 05/18/15 for INR and further adjustment in Coumadin dose.  He should receive today's Coumadin dose prior to discharge.

## 2015-05-13 NOTE — Progress Notes (Signed)
Pt a/o, no c/o pain, pt remains on heparin gtt @ 9.5 ml/hr, VSS, pt stable, wife at bedside

## 2015-05-15 NOTE — Discharge Summary (Signed)
Physician Discharge Summary  Robert [name] YFV:[id number] DOB: October 24, 1940 DOA: 05/06/2015  PCP: Burman Freestone, MD  Admit date: 05/06/2015 Discharge date: 05/15/2015  Time spent: 35 minutes  Recommendations for Outpatient Follow-up:  Needs B-met to follow renal function.  Needs INR and adjust coumadin as needed.   Discharge Diagnoses:    Acute on chronic systolic (congestive) heart failure (HCC)   Hypertensive urgency   Acute on CKD (chronic kidney disease) stage 3, GFR 30-59 ml/min   Chronic anemia   H/O mitral valve replacement with mechanical valve   CHF (congestive heart failure) (HCC)   Coronary artery disease involving native coronary artery of native heart without angina pectoris   Discharge Condition: stable  Diet recommendation: heart healthy  Filed Weights   05/11/15 0632 05/12/15 0546 05/13/15 1638  Weight: 55.067 kg (121 lb 6.4 oz) 53.978 kg (119 lb) 55.1 kg (121 lb 7.6 oz)    History of present illness:  Robert [name] is a 75 y.o. male with history of mechanical mitral valve, hypertension, chronic anemia chronic disease and history of gastric ulcer presents to the ER because of worsening shortness of breath. Patient has been getting increasingly short of breath on exertion over the last 4-5 days and patient's primary care physician had placed patient on Lasix first dose of which patient took yesterday. Despite taking which patient was still short of breath and had gone to the urgent care yesterday and was given spironolactone. Patient became acutely short of breath and presented to the ER. Chest x-ray shows pulmonary edema and patient's blood pressure was significantly elevated with systolic blood pressure more than 200. Patient denies any chest pain fever chills have some productive cough. Patient has been admitted for acute pulmonary edema and hypertensive urgency.   Hospital Course:  1. Acute on chronic systolic heart failure. Last EF 60% but has now dropped to  40% with some wall motion abnormality. History of mechanical mitral valve with therapeutic INR.  Seen by cardiology, underwent left heart catheterization showing mild to moderate CAD, clinically he is much improved after diuresis with Lasix. Plan to discharge home today, resume lasix.    2. 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 INR sub therapeutic. Continue with heparin Gtt. Continue with Coumadin. INR at 1.6.   3. Anemia of chronic disease. Low B-12 as well, placed on B-12 supplementation, iron levels borderline and he is on oral iron supplementation as well at home, his hemoglobin is between 9-9.5 going back to 2015 , outpatient age-appropriate workup by PCP. Note patient never has had a colonoscopy. Must follow with GI.  occult blood negative.   4. Low B-12 levels. Placed on replacement. Discharge on oral supplementation. Needs further prescriptions.   5. Hypertension. IV hydralazine as needed. On coreg. .holding ACR due to renal failure.   6. BPH. On Flomax continue.  7. Dyslipidemia. On statin continue.  8.Acute on CKD stage III - creat 1.8, baseline.  Cr stable, plan to resume lasix today.    Procedures:  Cath; Prox RCA lesion, 20% stenosed.  Prox LAD to Mid LAD lesion, 30% stenosed.  Mid LAD lesion, 40% stenosed.  Ost 1st Diag to 1st Diag lesion, 50% stenosed.  Ost 2nd Diag lesion, 30% stenosed.  1. Mild to moderate non-obstructive CAD 2. Normal movement of mechanical mitral valve leaflets.   3. Elevated filling pressures suggesting residual volume overload.   Consultations:  CArdiology  Discharge Exam: Filed Vitals:   05/13/15 4665 05/13/15 9935  BP: 140/62 149/73  Pulse: 62 62  Temp: 97.6 F (36.4 C)   Resp: 19     General: NAD Cardiovascular: S 1, S 2 RRR Respiratory: CTA  Discharge Instructions   Discharge Instructions    Diet - low sodium heart healthy    Complete by:  As directed      Increase activity  slowly    Complete by:  As directed           Discharge Medication List as of 05/13/2015 10:49 AM    START taking these medications   Details  carvedilol (COREG) 6.25 MG tablet Take 1 tablet (6.25 mg total) by mouth 2 (two) times daily with a meal., Starting 05/13/2015, Until Discontinued, Print    isosorbide mononitrate (IMDUR) 30 MG 24 hr tablet Take 1 tablet (30 mg total) by mouth daily., Starting 05/13/2015, Until Discontinued, Print    vitamin B-12 (CYANOCOBALAMIN) 1000 MCG tablet Take 1 tablet (1,000 mcg total) by mouth daily., Starting 05/13/2015, Until Discontinued, Print      CONTINUE these medications which have CHANGED   Details  ferrous sulfate 325 (65 FE) MG tablet Take 1 tablet (325 mg total) by mouth 2 (two) times daily with a meal., Starting 05/13/2015, Until Discontinued, Print    furosemide (LASIX) 20 MG tablet Take 3 tablets (60 mg total) by mouth daily., Starting 05/13/2015, Until Discontinued, Print      CONTINUE these medications which have NOT CHANGED   Details  aspirin 81 MG chewable tablet Chew 81 mg by mouth daily., Until Discontinued, Historical Med    atorvastatin (LIPITOR) 20 MG tablet Take 20 mg by mouth at bedtime., Until Discontinued, Historical Med    Multiple Vitamin (MULTIVITAMIN WITH MINERALS) TABS tablet Take 1 tablet by mouth daily., Until Discontinued, Historical Med    tamsulosin (FLOMAX) 0.4 MG CAPS capsule Take 0.4 mg by mouth daily after supper., Until Discontinued, Historical Med    warfarin (COUMADIN) 3 MG tablet Take 3 mg by mouth See admin instructions. Take 1 and 1/2 tablets every day except take 1 tablet on Sunday, Tuesday and Friday, Until Discontinued, Historical Med      STOP taking these medications     spironolactone (ALDACTONE) 25 MG tablet      valsartan (DIOVAN) 80 MG tablet        Allergies  Allergen Reactions  . Penicillins Other (See Comments)  . Vancomycin Other (See Comments)    Kidney problems   Follow-up Information     Follow up with Burman Freestone, MD. Go on 05/20/2015.   Specialty:  Internal Medicine   Why:  _0 :00pm   Contact information:   [address] [place] [address] [phone]       Follow up with Pixie Casino, MD. Go on 05/26/2015.   Specialty:  Cardiology   Why:  _1 :45 for post hospital    Contact information:   [place] [address] [phone]       Follow up with [facility]. Go on 05/18/2015.   Specialty:  Cardiology   Why:  _2 :30am for INR check with pharmacist and BMET to check kidney function   Contact information:   [address] [place] [phone]       The results of significant diagnostics from this hospitalization (including imaging, microbiology, ancillary and laboratory) are listed below for reference.    Significant Diagnostic Studies: Dg Chest 2 View  05/07/2015  CLINICAL DATA:  Shortness of breath for 1 week EXAM: CHEST  2 VIEW COMPARISON:  May 06, 2015 FINDINGS: Postoperative changes noted on the right. Patient is status post mitral valve replacement. There are areas of mild scarring in both lower lung zones. There is no edema or consolidation. Heart is borderline enlarged with pulmonary vascularity within normal limits. No adenopathy. No bone lesions. IMPRESSION: Stable postoperative change and areas of mild scarring in both lower lung zones. No edema or consolidation. Heart borderline enlarged but stable. Electronically Signed   By: Lowella Grip III M.D.   On: 05/07/2015 09:37   Dg Chest 2 View  05/06/2015  CLINICAL DATA:  Shortness of breath, weakness and cough for 2 days, hypertension EXAM: CHEST  2 VIEW COMPARISON:  05/05/2015 FINDINGS: Enlargement of cardiac silhouette post MVR. Mediastinal contours and pulmonary vascularity normal. Atherosclerotic calcification aorta. Emphysematous and minimal bronchitic changes consistent with COPD.  Bibasilar small pleural effusions greater on RIGHT. Minimal basilar atelectasis bilaterally. Upper lungs clear. No pneumothorax. Bones demineralized. IMPRESSION: Enlargement of cardiac silhouette post MVR. COPD changes with bibasilar small pleural effusions and basilar atelectasis. When compared to the previous exam, slightly improved bibasilar aeration. Electronically Signed   By: Lavonia Dana M.D.   On: 05/06/2015 14:28   Dg Chest Port 1 View  05/11/2015  CLINICAL DATA:  Shortness of Breath EXAM: PORTABLE CHEST 1 VIEW COMPARISON:  05/07/2015 FINDINGS: Cardiac shadow is stable. Postoperative changes are again seen. The lungs are well aerated bilaterally without focal infiltrate or sizable effusion. IMPRESSION: No acute abnormality noted. Electronically Signed   By: Inez Catalina M.D.   On: 05/11/2015 07:39    Microbiology: No results found for this or any previous visit (from the past 240 hour(s)).   Labs: Basic Metabolic Panel:  Recent Labs Lab 05/09/15 0335 05/10/15 0015 05/11/15 0551 05/12/15 0314 05/13/15 0340  NA 141 141 142 140 141  K 4.3 4.7 4.2 4.0 4.2  CL 106 106 107 104 110  CO2 _0 GLUCOSE 103* 121* 92 125* 120*  BUN 19 24* 23* 24* 23*  CREATININE 1.85* 1.87* 1.80* 1.96* 1.82*  CALCIUM 7.9* 7.8* 7.7* 7.9* 7.8*   Liver Function Tests: No results for input(s): AST, ALT, ALKPHOS, BILITOT, PROT, ALBUMIN in the last 168 hours. No results for input(s): LIPASE, AMYLASE in the last 168 hours. No results for input(s): AMMONIA in the last 168 hours. CBC:  Recent Labs Lab 05/10/15 0015 05/11/15 0551 05/12/15 0314 05/13/15 0340  WBC 7.2 5.7 6.5 5.4  HGB 8.5* 9.4* 10.0* 8.9*  HCT 27.6* 29.3* 32.2* 29.0*  MCV 92.6 93.3 93.6 93.2  PLT 240 249 286 243   Cardiac Enzymes: No results for input(s): CKTOTAL, CKMB, CKMBINDEX, TROPONINI in the last 168 hours. BNP: BNP (last 3 results)  Recent Labs  05/06/15 1406 05/11/15 0551  BNP 1406.9* 656.6*    ProBNP  (last 3 results) No results for input(s): PROBNP in the last 8760 hours.  CBG: No results for input(s): GLUCAP in the last 168 hours.     Signed:  Elmarie Shiley MD.  [facility] 05/15/2015, 5:31 PM

## 2015-05-18 ENCOUNTER — Other Ambulatory Visit: Payer: Self-pay | Admitting: *Deleted

## 2015-05-18 ENCOUNTER — Ambulatory Visit (INDEPENDENT_AMBULATORY_CARE_PROVIDER_SITE_OTHER): Payer: BC Managed Care – PPO | Admitting: Pharmacist Clinician (PhC)/ Clinical Pharmacy Specialist

## 2015-05-18 ENCOUNTER — Other Ambulatory Visit: Payer: Self-pay | Admitting: Internal Medicine

## 2015-05-18 DIAGNOSIS — N179 Acute kidney failure, unspecified: Secondary | ICD-10-CM

## 2015-05-18 DIAGNOSIS — Z7901 Long term (current) use of anticoagulants: Secondary | ICD-10-CM | POA: Diagnosis not present

## 2015-05-18 DIAGNOSIS — Z954 Presence of other heart-valve replacement: Secondary | ICD-10-CM | POA: Diagnosis not present

## 2015-05-18 DIAGNOSIS — Z952 Presence of prosthetic heart valve: Secondary | ICD-10-CM

## 2015-05-18 LAB — POCT INR: INR: 3.5

## 2015-05-19 ENCOUNTER — Telehealth: Payer: Self-pay | Admitting: Internal Medicine

## 2015-05-19 DIAGNOSIS — E875 Hyperkalemia: Secondary | ICD-10-CM

## 2015-05-19 LAB — BASIC METABOLIC PANEL
BUN: 47 mg/dL — AB (ref 7–25)
CO2: 29 mmol/L (ref 20–31)
Calcium: 8 mg/dL — ABNORMAL LOW (ref 8.6–10.3)
Chloride: 99 mmol/L (ref 98–110)
Creat: 2.92 mg/dL — ABNORMAL HIGH (ref 0.70–1.18)
Glucose, Bld: 83 mg/dL (ref 65–99)
POTASSIUM: 5.7 mmol/L — AB (ref 3.5–5.3)
Sodium: 138 mmol/L (ref 135–146)

## 2015-05-19 NOTE — Telephone Encounter (Signed)
Spoke with patient's wife - unable to reach patient about K of 5.7 as he is in the office per wife. Advised wife that patient needs to have repeat labs based on the results from 3/7 and stay hydrated per DOD Dr. Marlou Porch. She will communicate this to patient/husband.   BMET order placed STAT

## 2015-05-20 LAB — BASIC METABOLIC PANEL
BUN: 53 mg/dL — AB (ref 7–25)
CHLORIDE: 98 mmol/L (ref 98–110)
CO2: 27 mmol/L (ref 20–31)
Calcium: 7.9 mg/dL — ABNORMAL LOW (ref 8.6–10.3)
Creat: 2.71 mg/dL — ABNORMAL HIGH (ref 0.70–1.18)
GLUCOSE: 146 mg/dL — AB (ref 65–99)
POTASSIUM: 5.3 mmol/L (ref 3.5–5.3)
SODIUM: 135 mmol/L (ref 135–146)

## 2015-05-24 ENCOUNTER — Telehealth: Payer: Self-pay | Admitting: Internal Medicine

## 2015-05-24 NOTE — Telephone Encounter (Signed)
Pt would like his last lab results and also his echo and xray he had at [facility] in February please.

## 2015-05-24 NOTE — Telephone Encounter (Signed)
Returned call to patient/wife. Communicated that potassium level is normal. Wife asked about some labs, cxr, echo from hospital. Advised that these questions can be answered by the MD at his appt on 3/15.

## 2015-05-26 ENCOUNTER — Ambulatory Visit (INDEPENDENT_AMBULATORY_CARE_PROVIDER_SITE_OTHER): Payer: BC Managed Care – PPO | Admitting: *Deleted

## 2015-05-26 ENCOUNTER — Ambulatory Visit (INDEPENDENT_AMBULATORY_CARE_PROVIDER_SITE_OTHER): Payer: BC Managed Care – PPO | Admitting: Internal Medicine

## 2015-05-26 ENCOUNTER — Encounter: Payer: Self-pay | Admitting: Internal Medicine

## 2015-05-26 VITALS — BP 120/68 | HR 60 | Ht 66.0 in | Wt 124.0 lb

## 2015-05-26 DIAGNOSIS — Z954 Presence of other heart-valve replacement: Secondary | ICD-10-CM

## 2015-05-26 DIAGNOSIS — Z7901 Long term (current) use of anticoagulants: Secondary | ICD-10-CM | POA: Diagnosis not present

## 2015-05-26 DIAGNOSIS — Z952 Presence of prosthetic heart valve: Secondary | ICD-10-CM

## 2015-05-26 DIAGNOSIS — Z79899 Other long term (current) drug therapy: Secondary | ICD-10-CM | POA: Diagnosis not present

## 2015-05-26 DIAGNOSIS — I2510 Atherosclerotic heart disease of native coronary artery without angina pectoris: Secondary | ICD-10-CM

## 2015-05-26 DIAGNOSIS — D509 Iron deficiency anemia, unspecified: Secondary | ICD-10-CM

## 2015-05-26 DIAGNOSIS — I5023 Acute on chronic systolic (congestive) heart failure: Secondary | ICD-10-CM

## 2015-05-26 DIAGNOSIS — N183 Chronic kidney disease, stage 3 unspecified: Secondary | ICD-10-CM

## 2015-05-26 DIAGNOSIS — R791 Abnormal coagulation profile: Secondary | ICD-10-CM | POA: Diagnosis not present

## 2015-05-26 DIAGNOSIS — Z9114 Patient's other noncompliance with medication regimen: Secondary | ICD-10-CM

## 2015-05-26 LAB — POCT INR: INR: 6.2

## 2015-05-26 NOTE — Progress Notes (Signed)
OFFICE NOTE  Chief Complaint:  Hospital follow-up, weak, fatigued, less appetite  Primary Care Physician: Burman Freestone, MD  HPI:  Robert [name] is a 75 y.o. 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 He has been followed by Dr. Geraldo Pitter at [facility] cardiology and was referred to Dr. Evelina Dun at [facility] in 2013 for evaluation of his mitral stenosis. He was followed clinically and underwent mechanical mitral valve replacement in 2015. Since then he's been compliant on warfarin and has had therapeutic to supratherapeutic INRs. LV function was normal prior to surgery however we cannot find echocardiographic results after his surgery. A heart catheterization performed prior to surgery demonstrated a 70% diagonal stenosis and 55% LAD stenosis, but he did not have any bypass surgery. He now presents with one week of progressive shortness of breath, orthopnea and leg edema concerning for heart failure. He seems to be responding to IV Lasix. Chest x-ray shows COPD changes, small bilateral pleural effusions and some basilar atelectasis. Labs indicate markedly elevated BNP of 1406 and troponin is negative 3. There appears to be a mild iron deficiency anemia with H&H of 9 and 29. INR initially was 4.37 that came down to 3.98. He was admitted and treated for heart failure with diuresis. He underwent right and left heart catheterization which demonstrated the following:   Prox RCA lesion, 20% stenosed.  Prox LAD to Mid LAD lesion, 30% stenosed.  Mid LAD lesion, 40% stenosed.  Ost 1st Diag to 1st Diag lesion, 50% stenosed.  Ost 2nd Diag lesion, 30% stenosed.  1. Mild to moderate non-obstructive CAD 2. Normal movement of mechanical mitral valve leaflets.  3. Elevated filling pressures suggesting residual volume overload.   He also had an echocardiogram which showed a  newly reduced LVEF of 45-50%. At discharge he was scheduled to be on Lasix 60 mg daily, and increase from his prior home dose of 40 mg daily. It was also recommended that he discontinue valsartan and spironolactone. After discharge he had routine lab work which demonstrated acute renal failure and creatinine had increased from 1.8-2.9. He was also markedly hyperkalemic at 5.7. This was brought to the attention of the [facility] DOD Dr. Marlou Porch, who recommended hydration and avoiding potassium with a repeat metabolic profile the next day. The repeat metabolic profile showed creatinine had decreased to 2.7 and potassium was 5.3. Since discharge Robert [name] had poor appetite, weight has remained stable and he is felt weak and had poor energy. Much worse than when he was discharged. I spent a good deal of time reviewing the patient's medications with his wife who had a 3 x 5 note card with his previous medications on the front and new medications on the rear. She tells me that he is actually taking all of these medications, therefore he is taking his old dose of Lasix 40 mg in addition to Lasix 60 mg daily as well as spironolactone and valsartan. Certainly explain his acute renal failure as well as hyperkalemia. I reviewed his discharge medication summary which clearly states to discontinue valsartan and Lasix and noted the dose changes of medications. He was seen in his primary care doctor's office after discharge however it appears that these medication changes were not identified. In addition, his INR is markedly elevated today at 6.2. This is up from 3.5 where it was 1 week ago. I suspect again this is related to renal  failure, decreased appetite and nutritional reasons. Medication noncompliance could be playing a role.  PMHx:  Past Medical History  Diagnosis Date  . Hypertension   . High cholesterol   . Chronic anticoagulation     coumadin for mechanical valves  . BPH (benign prostatic hyperplasia)   . CKD  (chronic kidney disease), stage III   . Gastric cancer ([place]) 2005    s/p gastrectomy  . Rheumatic heart disease   . HOH (hard of hearing)   . Diabetes mellitus type 2, diet-controlled ([place])   . S/P MVR (mitral valve replacement) 2015    6mm StJude mechanical valve    Past Surgical History  Procedure Laterality Date  . Mitral valve replacement  10/24/2013    44mm StJude mechanical valve  . Partial gastrectomy  2005  . Hernia repair    . Cardiac catheterization N/A 05/10/2015    Procedure: Right/Left Heart Cath and Coronary Angiography;  Surgeon: Burnell Blanks, MD;  Location: [facility];  Service: Cardiovascular;  Laterality: N/A;    FAMHx:  Family History  Problem Relation Age of Onset  . Hypertension Mother   . Diabetes Mother   . Cancer Brother     SOCHx:   reports that he has never smoked. He has never used smokeless tobacco. He reports that he does not drink alcohol or use illicit drugs.  ALLERGIES:  Allergies  Allergen Reactions  . Penicillins Other (See Comments)  . Vancomycin Other (See Comments)    Kidney problems    ROS: Pertinent items noted in HPI and remainder of comprehensive ROS otherwise negative.  HOME MEDS: Current Outpatient Prescriptions  Medication Sig Dispense Refill  . aspirin 81 MG chewable tablet Chew 81 mg by mouth daily.    Marland Kitchen atorvastatin (LIPITOR) 20 MG tablet Take 20 mg by mouth at bedtime.    . carvedilol (COREG) 6.25 MG tablet Take 1 tablet (6.25 mg total) by mouth 2 (two) times daily with a meal. 60 tablet 0  . ferrous sulfate 325 (65 FE) MG tablet Take 1 tablet (325 mg total) by mouth 2 (two) times daily with a meal. 30 tablet 3  . furosemide (LASIX) 20 MG tablet Take 2 tablets (40mg ) by mouth once daily    . isosorbide mononitrate (IMDUR) 30 MG 24 hr tablet Take 1 tablet (30 mg total) by mouth daily. 30 tablet 0  . Multiple Vitamin (MULTIVITAMIN WITH MINERALS) TABS tablet Take 1 tablet by mouth daily.    . tamsulosin  (FLOMAX) 0.4 MG CAPS capsule Take 0.4 mg by mouth daily after supper.    . vitamin B-12 (CYANOCOBALAMIN) 1000 MCG tablet Take 1 tablet (1,000 mcg total) by mouth daily. 7 tablet 0  . warfarin (COUMADIN) 3 MG tablet Take 3 mg by mouth See admin instructions. Take 1 and 1/2 tablets every day except take 1 tablet on Sunday, Tuesday and Friday     No current facility-administered medications for this visit.    LABS/IMAGING: No results found for this or any previous visit (from the past 48 hour(s)). No results found.  WEIGHTS: Wt Readings from Last 3 Encounters:  05/26/15 124 lb (56.246 kg)  05/13/15 121 lb 7.6 oz (55.1 kg)    VITALS: BP 120/68 mmHg  Pulse 60  Ht 5\' 6"  (1.676 m)  Wt 124 lb (56.246 kg)  BMI 20.02 kg/m2  EXAM: General appearance: alert, appears older than stated age, cachectic and no distress Neck: no carotid bruit, no JVD and thyroid not enlarged,  symmetric, no tenderness/mass/nodules Lungs: clear to auscultation bilaterally Heart: regular rate and rhythm, S1, S2 normal, no murmur, click, rub or gallop Abdomen: soft, non-tender; bowel sounds normal; no masses,  no organomegaly Extremities: extremities normal, atraumatic, no cyanosis or edema Pulses: 2+ and symmetric Skin: Skin color, texture, turgor normal. No rashes or lesions Neurologic: Grossly normal Psych: Pleasant  EKG: Deferred  ASSESSMENT: 1. Acute systolic congestive heart failure-EF reduced to 45% 2. Mild to moderate nonobstructive coronary artery disease by cath 3. Normally functioning mechanical mitral valve 4. Warfarin coagulopathy 5. Acute on chronic kidney disease stage III 6. Medication noncompliance  PLAN: 1.   Robert [name] feels worse since he was discharged from the hospital and that likely because he is in renal failure. The reason is that he's taking both medications prior to hospitalization and those that were added during his hospitalization. This seems to be some significant confusion on  the part of his wife. We spent a great deal of time reconciling his medication list today and_the importance of compliance with those medications. I've advised him to discontinue his 40 mg Lasix dose as well as the 60 mg Lasix dose for at least 2 days. At that time he would resume his 40 mg Lasix dose only. We've also advised him to discontinue spironolactone and valsartan and which were also discontinued at discharge from his recent hospitalization. He will need repeat metabolic profile testing as well as a CBC to evaluate for stability of his anemia in the setting of warfarin coagulopathy. With regards to his INR, we've advised him to hold his warfarin for at least 3 days and he will be contacted by Erasmo Downer, our anticoagulation pharmacist tomorrow for further instructions on future dosing of his medications. Keeping in mind that his oral intake is decreased significantly. I have encouraged him to drink water as well.   Follow-up with me in a week.  Pixie Casino, MD, [facility] Attending Cardiologist [place] [facility] 05/26/2015, 5:48 PM

## 2015-05-26 NOTE — Patient Instructions (Signed)
STOP VALSARTAN STOP SPIRONOLACTONE  HOLD lasix for 2 days -- Restart lasix 40mg  daily  HOLD WARFARIN TODAY Wednesday 3/15  Your physician recommends that you return for lab work Monday (BMET, CBC)  Your physician recommends that you schedule a follow-up appointment in [place] with Dr. Debara Pickett - OK to double book

## 2015-05-31 ENCOUNTER — Ambulatory Visit (INDEPENDENT_AMBULATORY_CARE_PROVIDER_SITE_OTHER): Payer: BC Managed Care – PPO | Admitting: Pharmacist Clinician (PhC)/ Clinical Pharmacy Specialist

## 2015-05-31 DIAGNOSIS — Z7901 Long term (current) use of anticoagulants: Secondary | ICD-10-CM

## 2015-05-31 DIAGNOSIS — Z952 Presence of prosthetic heart valve: Secondary | ICD-10-CM

## 2015-05-31 DIAGNOSIS — Z954 Presence of other heart-valve replacement: Secondary | ICD-10-CM

## 2015-05-31 LAB — POCT INR: INR: 1.4

## 2015-06-01 LAB — BASIC METABOLIC PANEL
BUN: 39 mg/dL — ABNORMAL HIGH (ref 7–25)
CHLORIDE: 107 mmol/L (ref 98–110)
CO2: 26 mmol/L (ref 20–31)
CREATININE: 1.81 mg/dL — AB (ref 0.70–1.18)
Calcium: 8.4 mg/dL — ABNORMAL LOW (ref 8.6–10.3)
Glucose, Bld: 128 mg/dL — ABNORMAL HIGH (ref 65–99)
Potassium: 5 mmol/L (ref 3.5–5.3)
Sodium: 140 mmol/L (ref 135–146)

## 2015-06-01 LAB — CBC
HEMATOCRIT: 34.8 % — AB (ref 39.0–52.0)
HEMOGLOBIN: 10.7 g/dL — AB (ref 13.0–17.0)
MCH: 28.7 pg (ref 26.0–34.0)
MCHC: 30.7 g/dL (ref 30.0–36.0)
MCV: 93.3 fL (ref 78.0–100.0)
MPV: 9.8 fL (ref 8.6–12.4)
PLATELETS: 255 10*3/uL (ref 150–400)
RBC: 3.73 MIL/uL — AB (ref 4.22–5.81)
RDW: 13.7 % (ref 11.5–15.5)
WBC: 6.9 10*3/uL (ref 4.0–10.5)

## 2015-06-03 ENCOUNTER — Ambulatory Visit (INDEPENDENT_AMBULATORY_CARE_PROVIDER_SITE_OTHER): Payer: BC Managed Care – PPO | Admitting: Pharmacist Clinician (PhC)/ Clinical Pharmacy Specialist

## 2015-06-03 ENCOUNTER — Encounter: Payer: Self-pay | Admitting: Internal Medicine

## 2015-06-03 ENCOUNTER — Ambulatory Visit (INDEPENDENT_AMBULATORY_CARE_PROVIDER_SITE_OTHER): Payer: BC Managed Care – PPO | Admitting: Internal Medicine

## 2015-06-03 VITALS — BP 120/68 | HR 59 | Ht 66.0 in | Wt 124.2 lb

## 2015-06-03 DIAGNOSIS — Z952 Presence of prosthetic heart valve: Secondary | ICD-10-CM

## 2015-06-03 DIAGNOSIS — Z954 Presence of other heart-valve replacement: Secondary | ICD-10-CM

## 2015-06-03 DIAGNOSIS — E785 Hyperlipidemia, unspecified: Secondary | ICD-10-CM

## 2015-06-03 DIAGNOSIS — I2510 Atherosclerotic heart disease of native coronary artery without angina pectoris: Secondary | ICD-10-CM | POA: Diagnosis not present

## 2015-06-03 DIAGNOSIS — Z79899 Other long term (current) drug therapy: Secondary | ICD-10-CM

## 2015-06-03 DIAGNOSIS — N183 Chronic kidney disease, stage 3 unspecified: Secondary | ICD-10-CM

## 2015-06-03 DIAGNOSIS — Z7901 Long term (current) use of anticoagulants: Secondary | ICD-10-CM | POA: Diagnosis not present

## 2015-06-03 DIAGNOSIS — I5023 Acute on chronic systolic (congestive) heart failure: Secondary | ICD-10-CM

## 2015-06-03 NOTE — Patient Instructions (Signed)
Your physician recommends that you schedule a follow-up appointment in 3 months.   Your physician recommends that you return for lab work - CMET, lipid in 3 months (fasting)

## 2015-06-03 NOTE — Progress Notes (Signed)
OFFICE NOTE  Chief Complaint:  Follow-up med changes  Primary Care Physician: Burman Freestone, MD  HPI:  Robert [name] is a 75 y.o. 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 He has been followed by Dr. Geraldo Pitter at [facility] cardiology and was referred to Dr. Evelina Dun at [facility] in 2013 for evaluation of his mitral stenosis. He was followed clinically and underwent mechanical mitral valve replacement in 2015. Since then he's been compliant on warfarin and has had therapeutic to supratherapeutic INRs. LV function was normal prior to surgery however we cannot find echocardiographic results after his surgery. A heart catheterization performed prior to surgery demonstrated a 70% diagonal stenosis and 55% LAD stenosis, but he did not have any bypass surgery. He now presents with one week of progressive shortness of breath, orthopnea and leg edema concerning for heart failure. He seems to be responding to IV Lasix. Chest x-ray shows COPD changes, small bilateral pleural effusions and some basilar atelectasis. Labs indicate markedly elevated BNP of 1406 and troponin is negative 3. There appears to be a mild iron deficiency anemia with H&H of 9 and 29. INR initially was 4.37 that came down to 3.98. He was admitted and treated for heart failure with diuresis. He underwent right and left heart catheterization which demonstrated the following:   Prox RCA lesion, 20% stenosed.  Prox LAD to Mid LAD lesion, 30% stenosed.  Mid LAD lesion, 40% stenosed.  Ost 1st Diag to 1st Diag lesion, 50% stenosed.  Ost 2nd Diag lesion, 30% stenosed.  1. Mild to moderate non-obstructive CAD 2. Normal movement of mechanical mitral valve leaflets.  3. Elevated filling pressures suggesting residual volume overload.   He also had an echocardiogram which showed a newly reduced LVEF of  45-50%. At discharge he was scheduled to be on Lasix 60 mg daily, and increase from his prior home dose of 40 mg daily. It was also recommended that he discontinue valsartan and spironolactone. After discharge he had routine lab work which demonstrated acute renal failure and creatinine had increased from 1.8-2.9. He was also markedly hyperkalemic at 5.7. This was brought to the attention of the [facility] DOD Dr. Marlou Porch, who recommended hydration and avoiding potassium with a repeat metabolic profile the next day. The repeat metabolic profile showed creatinine had decreased to 2.7 and potassium was 5.3. Since discharge Robert [name] had poor appetite, weight has remained stable and he is felt weak and had poor energy. Much worse than when he was discharged. I spent a good deal of time reviewing the patient's medications with his wife who had a 3 x 5 note card with his previous medications on the front and new medications on the rear. She tells me that he is actually taking all of these medications, therefore he is taking his old dose of Lasix 40 mg in addition to Lasix 60 mg daily as well as spironolactone and valsartan. Certainly explain his acute renal failure as well as hyperkalemia. I reviewed his discharge medication summary which clearly states to discontinue valsartan and Lasix and noted the dose changes of medications. He was seen in his primary care doctor's office after discharge however it appears that these medication changes were not identified. In addition, his INR is markedly elevated today at 6.2. This is up from 3.5 where it was 1 week ago. I suspect again this is related to renal failure, decreased appetite  and nutritional reasons. Medication noncompliance could be playing a role.  Robert [name] returns today for follow-up. Since we sorted out his medications he's done much better. His creatinine has gone back down to baseline of 1.8. He is on iron and B12 and his hemoglobin and hematocrit of come up  to 10.7 and 32. He reports marked improvement in his energy. He does say that he has problems with decreased energy and sleep although he feels like he gets a restful night sleep. He denies any snoring or apnea. He wakes up fairly early in the morning and does yoga and often takes a nap in the morning. Unfortunately his INR became subtherapeutic after holding his warfarin for coagulopathy. On the 20th it was 1.4. Today his INR is 1.8.  PMHx:  Past Medical History  Diagnosis Date  . Hypertension   . High cholesterol   . Chronic anticoagulation     coumadin for mechanical valves  . BPH (benign prostatic hyperplasia)   . CKD (chronic kidney disease), stage III   . Gastric cancer ([place]) 2005    s/p gastrectomy  . Rheumatic heart disease   . HOH (hard of hearing)   . Diabetes mellitus type 2, diet-controlled ([place])   . S/P MVR (mitral valve replacement) 2015    38mm StJude mechanical valve    Past Surgical History  Procedure Laterality Date  . Mitral valve replacement  10/24/2013    62mm StJude mechanical valve  . Partial gastrectomy  2005  . Hernia repair    . Cardiac catheterization N/A 05/10/2015    Procedure: Right/Left Heart Cath and Coronary Angiography;  Surgeon: Burnell Blanks, MD;  Location: [facility];  Service: Cardiovascular;  Laterality: N/A;    FAMHx:  Family History  Problem Relation Age of Onset  . Hypertension Mother   . Diabetes Mother   . Cancer Brother     SOCHx:   reports that he has never smoked. He has never used smokeless tobacco. He reports that he does not drink alcohol or use illicit drugs.  ALLERGIES:  Allergies  Allergen Reactions  . Penicillins Other (See Comments)  . Vancomycin Other (See Comments)    Kidney problems    ROS: Pertinent items noted in HPI and remainder of comprehensive ROS otherwise negative.  HOME MEDS: Current Outpatient Prescriptions  Medication Sig Dispense Refill  . aspirin 81 MG chewable tablet Chew 81  mg by mouth daily.    Marland Kitchen atorvastatin (LIPITOR) 20 MG tablet Take 20 mg by mouth at bedtime.    . carvedilol (COREG) 6.25 MG tablet Take 1 tablet (6.25 mg total) by mouth 2 (two) times daily with a meal. 60 tablet 0  . ferrous sulfate 325 (65 FE) MG tablet Take 1 tablet (325 mg total) by mouth 2 (two) times daily with a meal. 30 tablet 3  . furosemide (LASIX) 20 MG tablet Take 2 tablets (40mg ) by mouth once daily    . isosorbide mononitrate (IMDUR) 30 MG 24 hr tablet Take 1 tablet (30 mg total) by mouth daily. 30 tablet 0  . Multiple Vitamin (MULTIVITAMIN WITH MINERALS) TABS tablet Take 1 tablet by mouth daily.    . tamsulosin (FLOMAX) 0.4 MG CAPS capsule Take 0.4 mg by mouth daily after supper.    . vitamin B-12 (CYANOCOBALAMIN) 1000 MCG tablet Take 1 tablet (1,000 mcg total) by mouth daily. 7 tablet 0  . warfarin (COUMADIN) 3 MG tablet Take 3 mg by mouth See admin instructions. Take 1  and 1/2 tablets every day except take 1 tablet on Sunday, Tuesday and Friday     No current facility-administered medications for this visit.    LABS/IMAGING: No results found for this or any previous visit (from the past 48 hour(s)). No results found.  WEIGHTS: Wt Readings from Last 3 Encounters:  06/03/15 124 lb 3.2 oz (56.337 kg)  05/26/15 124 lb (56.246 kg)  05/13/15 121 lb 7.6 oz (55.1 kg)    VITALS: BP 120/68 mmHg  Pulse 59  Ht 5\' 6"  (1.676 m)  Wt 124 lb 3.2 oz (56.337 kg)  BMI 20.06 kg/m2  EXAM: Deferred  EKG: Deferred  ASSESSMENT: 1. Acute systolic congestive heart failure-EF reduced to 45% 2. Mild to moderate nonobstructive coronary artery disease by cath 3. Normally functioning mechanical mitral valve 4. Warfarin coagulopathy 5. Acute on chronic kidney disease stage III 6. Medication noncompliance  PLAN: 1.   Robert [name] is now feeling better. His creatinine is gone back to baseline. He is on a stable dose of Lasix 40 mg daily. We'll continue with this current dose for now. He is  INR was low and is being adjusted by our anticoagulation pharmacist. His INR is 1.8 today. We will start him on Lovenox injections until we can get his INR back up to 2.5-3.5 which is his target range. I would like to check a repeat comprehensive metabolic profile and lipid profile in 3 months and see him back at that time. He is advised to continue to check daily weights and contact me in the interim if there is any change in his symptoms.  Pixie Casino, MD, [facility] Attending Cardiologist [facility] 06/03/2015, 8:15 AM

## 2015-06-07 ENCOUNTER — Ambulatory Visit (INDEPENDENT_AMBULATORY_CARE_PROVIDER_SITE_OTHER): Payer: BC Managed Care – PPO | Admitting: Pharmacist Clinician (PhC)/ Clinical Pharmacy Specialist

## 2015-06-07 DIAGNOSIS — Z954 Presence of other heart-valve replacement: Secondary | ICD-10-CM

## 2015-06-07 DIAGNOSIS — Z7901 Long term (current) use of anticoagulants: Secondary | ICD-10-CM | POA: Diagnosis not present

## 2015-06-07 DIAGNOSIS — Z952 Presence of prosthetic heart valve: Secondary | ICD-10-CM

## 2015-06-07 LAB — POCT INR: INR: 3.7

## 2015-06-10 ENCOUNTER — Other Ambulatory Visit: Payer: Self-pay | Admitting: Internal Medicine

## 2015-06-10 ENCOUNTER — Other Ambulatory Visit: Payer: Self-pay | Admitting: *Deleted

## 2015-06-10 MED ORDER — TAMSULOSIN HCL 0.4 MG PO CAPS: 0.4000 mg | ORAL_CAPSULE | Freq: Every day | ORAL | Status: DC

## 2015-06-10 MED ORDER — CARVEDILOL 6.25 MG PO TABS: 6.2500 mg | ORAL_TABLET | Freq: Two times a day (BID) | ORAL | Status: DC

## 2015-06-10 MED ORDER — ISOSORBIDE MONONITRATE ER 30 MG PO TB24: 30.0000 mg | ORAL_TABLET | Freq: Every day | ORAL | Status: DC

## 2015-06-10 NOTE — Telephone Encounter (Signed)
REFILL 

## 2015-06-14 ENCOUNTER — Ambulatory Visit (INDEPENDENT_AMBULATORY_CARE_PROVIDER_SITE_OTHER): Payer: BC Managed Care – PPO | Admitting: Pharmacist Clinician (PhC)/ Clinical Pharmacy Specialist

## 2015-06-14 DIAGNOSIS — Z7901 Long term (current) use of anticoagulants: Secondary | ICD-10-CM | POA: Diagnosis not present

## 2015-06-14 DIAGNOSIS — Z954 Presence of other heart-valve replacement: Secondary | ICD-10-CM | POA: Diagnosis not present

## 2015-06-14 DIAGNOSIS — Z952 Presence of prosthetic heart valve: Secondary | ICD-10-CM

## 2015-06-14 LAB — POCT INR: INR: 4

## 2015-06-22 ENCOUNTER — Encounter: Payer: BC Managed Care – PPO | Admitting: Pharmacist Clinician (PhC)/ Clinical Pharmacy Specialist

## 2015-06-22 ENCOUNTER — Ambulatory Visit (INDEPENDENT_AMBULATORY_CARE_PROVIDER_SITE_OTHER): Payer: BC Managed Care – PPO | Admitting: Pharmacist Clinician (PhC)/ Clinical Pharmacy Specialist

## 2015-06-22 DIAGNOSIS — Z7901 Long term (current) use of anticoagulants: Secondary | ICD-10-CM

## 2015-06-22 DIAGNOSIS — Z954 Presence of other heart-valve replacement: Secondary | ICD-10-CM | POA: Diagnosis not present

## 2015-06-22 DIAGNOSIS — Z952 Presence of prosthetic heart valve: Secondary | ICD-10-CM

## 2015-06-22 LAB — POCT INR: INR: 2.2

## 2015-07-05 ENCOUNTER — Ambulatory Visit (INDEPENDENT_AMBULATORY_CARE_PROVIDER_SITE_OTHER): Payer: BC Managed Care – PPO | Admitting: Pharmacist Clinician (PhC)/ Clinical Pharmacy Specialist

## 2015-07-05 DIAGNOSIS — Z7901 Long term (current) use of anticoagulants: Secondary | ICD-10-CM

## 2015-07-05 DIAGNOSIS — Z954 Presence of other heart-valve replacement: Secondary | ICD-10-CM

## 2015-07-05 DIAGNOSIS — Z952 Presence of prosthetic heart valve: Secondary | ICD-10-CM

## 2015-07-05 LAB — POCT INR: INR: 3.1

## 2015-07-09 ENCOUNTER — Telehealth: Payer: Self-pay | Admitting: Internal Medicine

## 2015-07-09 NOTE — Telephone Encounter (Signed)
Request for surgical clearance:  1. What type of surgery is being performed? Tooth extraction  2. When is this surgery scheduled? n/a  3. Are there any medications that need to be held prior to surgery and how long? Maybe coum- Megan did not know  4. Name of physician performing surgery? Dr Doyne Keel, Dr Jaquita Folds  5. What is your office phone and fax number? [phone]  Office also wanted to know if pt can take vicatin

## 2015-07-12 NOTE — Telephone Encounter (Signed)
Spoke with Jinny Blossom, will send letter with our recommendations to fax [phone] ([facility])

## 2015-07-19 ENCOUNTER — Ambulatory Visit (INDEPENDENT_AMBULATORY_CARE_PROVIDER_SITE_OTHER): Payer: BC Managed Care – PPO | Admitting: Pharmacist Clinician (PhC)/ Clinical Pharmacy Specialist

## 2015-07-19 DIAGNOSIS — Z7901 Long term (current) use of anticoagulants: Secondary | ICD-10-CM | POA: Diagnosis not present

## 2015-07-19 DIAGNOSIS — Z954 Presence of other heart-valve replacement: Secondary | ICD-10-CM | POA: Diagnosis not present

## 2015-07-19 DIAGNOSIS — Z952 Presence of prosthetic heart valve: Secondary | ICD-10-CM

## 2015-07-19 LAB — POCT INR: INR: 2.6

## 2015-07-28 ENCOUNTER — Ambulatory Visit: Payer: Medicare Other

## 2015-08-13 ENCOUNTER — Other Ambulatory Visit: Payer: Self-pay | Admitting: Pharmacist Clinician (PhC)/ Clinical Pharmacy Specialist

## 2015-08-13 ENCOUNTER — Ambulatory Visit (INDEPENDENT_AMBULATORY_CARE_PROVIDER_SITE_OTHER): Payer: BC Managed Care – PPO | Admitting: Pharmacist Clinician (PhC)/ Clinical Pharmacy Specialist

## 2015-08-13 DIAGNOSIS — Z7901 Long term (current) use of anticoagulants: Secondary | ICD-10-CM | POA: Diagnosis not present

## 2015-08-13 DIAGNOSIS — Z954 Presence of other heart-valve replacement: Secondary | ICD-10-CM

## 2015-08-13 DIAGNOSIS — Z952 Presence of prosthetic heart valve: Secondary | ICD-10-CM

## 2015-08-13 LAB — POCT INR: INR: 2

## 2015-08-13 MED ORDER — WARFARIN SODIUM 3 MG PO TABS: ORAL_TABLET | ORAL | Status: DC

## 2015-08-19 ENCOUNTER — Ambulatory Visit (INDEPENDENT_AMBULATORY_CARE_PROVIDER_SITE_OTHER): Payer: BC Managed Care – PPO | Admitting: Pharmacist

## 2015-08-19 ENCOUNTER — Other Ambulatory Visit: Payer: Self-pay | Admitting: Internal Medicine

## 2015-08-19 DIAGNOSIS — Z952 Presence of prosthetic heart valve: Secondary | ICD-10-CM

## 2015-08-19 DIAGNOSIS — Z954 Presence of other heart-valve replacement: Secondary | ICD-10-CM | POA: Diagnosis not present

## 2015-08-19 DIAGNOSIS — Z7901 Long term (current) use of anticoagulants: Secondary | ICD-10-CM

## 2015-08-19 LAB — POCT INR: INR: 3.9

## 2015-08-19 MED ORDER — FUROSEMIDE 40 MG PO TABS: 40.0000 mg | ORAL_TABLET | Freq: Every day | ORAL | Status: DC

## 2015-08-28 ENCOUNTER — Inpatient Hospital Stay (HOSPITAL_COMMUNITY)
Admission: EM | Admit: 2015-08-28 | Discharge: 2015-09-03 | DRG: 388 | Disposition: A | Discharge status: Home Health | Payer: BC Managed Care – PPO | Attending: Internal Medicine | Admitting: Internal Medicine

## 2015-08-28 ENCOUNTER — Encounter (HOSPITAL_COMMUNITY): Payer: Self-pay | Admitting: *Deleted

## 2015-08-28 ENCOUNTER — Other Ambulatory Visit: Payer: Self-pay

## 2015-08-28 DIAGNOSIS — I130 Hypertensive heart and chronic kidney disease with heart failure and stage 1 through stage 4 chronic kidney disease, or unspecified chronic kidney disease: Secondary | ICD-10-CM | POA: Diagnosis present

## 2015-08-28 DIAGNOSIS — I471 Supraventricular tachycardia: Secondary | ICD-10-CM | POA: Diagnosis not present

## 2015-08-28 DIAGNOSIS — I160 Hypertensive urgency: Secondary | ICD-10-CM | POA: Diagnosis present

## 2015-08-28 DIAGNOSIS — N179 Acute kidney failure, unspecified: Secondary | ICD-10-CM | POA: Insufficient documentation

## 2015-08-28 DIAGNOSIS — Z681 Body mass index (BMI) 19 or less, adult: Secondary | ICD-10-CM

## 2015-08-28 DIAGNOSIS — I248 Other forms of acute ischemic heart disease: Secondary | ICD-10-CM | POA: Diagnosis present

## 2015-08-28 DIAGNOSIS — E7800 Pure hypercholesterolemia, unspecified: Secondary | ICD-10-CM | POA: Diagnosis present

## 2015-08-28 DIAGNOSIS — Z79899 Other long term (current) drug therapy: Secondary | ICD-10-CM

## 2015-08-28 DIAGNOSIS — R7989 Other specified abnormal findings of blood chemistry: Secondary | ICD-10-CM | POA: Diagnosis present

## 2015-08-28 DIAGNOSIS — Z0189 Encounter for other specified special examinations: Secondary | ICD-10-CM

## 2015-08-28 DIAGNOSIS — E785 Hyperlipidemia, unspecified: Secondary | ICD-10-CM | POA: Diagnosis present

## 2015-08-28 DIAGNOSIS — E43 Unspecified severe protein-calorie malnutrition: Secondary | ICD-10-CM | POA: Diagnosis present

## 2015-08-28 DIAGNOSIS — Z809 Family history of malignant neoplasm, unspecified: Secondary | ICD-10-CM

## 2015-08-28 DIAGNOSIS — N400 Enlarged prostate without lower urinary tract symptoms: Secondary | ICD-10-CM | POA: Diagnosis present

## 2015-08-28 DIAGNOSIS — E1122 Type 2 diabetes mellitus with diabetic chronic kidney disease: Secondary | ICD-10-CM | POA: Diagnosis present

## 2015-08-28 DIAGNOSIS — R109 Unspecified abdominal pain: Secondary | ICD-10-CM

## 2015-08-28 DIAGNOSIS — Z7901 Long term (current) use of anticoagulants: Secondary | ICD-10-CM

## 2015-08-28 DIAGNOSIS — Z880 Allergy status to penicillin: Secondary | ICD-10-CM

## 2015-08-28 DIAGNOSIS — K56609 Unspecified intestinal obstruction, unspecified as to partial versus complete obstruction: Secondary | ICD-10-CM | POA: Diagnosis present

## 2015-08-28 DIAGNOSIS — K5660 Unspecified intestinal obstruction: Principal | ICD-10-CM | POA: Diagnosis present

## 2015-08-28 DIAGNOSIS — Z833 Family history of diabetes mellitus: Secondary | ICD-10-CM

## 2015-08-28 DIAGNOSIS — Z7982 Long term (current) use of aspirin: Secondary | ICD-10-CM

## 2015-08-28 DIAGNOSIS — H9190 Unspecified hearing loss, unspecified ear: Secondary | ICD-10-CM | POA: Diagnosis present

## 2015-08-28 DIAGNOSIS — N183 Chronic kidney disease, stage 3 unspecified: Secondary | ICD-10-CM

## 2015-08-28 DIAGNOSIS — K859 Acute pancreatitis without necrosis or infection, unspecified: Secondary | ICD-10-CM | POA: Diagnosis present

## 2015-08-28 DIAGNOSIS — Z952 Presence of prosthetic heart valve: Secondary | ICD-10-CM

## 2015-08-28 DIAGNOSIS — I5022 Chronic systolic (congestive) heart failure: Secondary | ICD-10-CM | POA: Diagnosis present

## 2015-08-28 DIAGNOSIS — E561 Deficiency of vitamin K: Secondary | ICD-10-CM | POA: Diagnosis present

## 2015-08-28 DIAGNOSIS — Z881 Allergy status to other antibiotic agents status: Secondary | ICD-10-CM

## 2015-08-28 DIAGNOSIS — Z85028 Personal history of other malignant neoplasm of stomach: Secondary | ICD-10-CM

## 2015-08-28 DIAGNOSIS — I4581 Long QT syndrome: Secondary | ICD-10-CM | POA: Diagnosis present

## 2015-08-28 DIAGNOSIS — D735 Infarction of spleen: Secondary | ICD-10-CM | POA: Diagnosis present

## 2015-08-28 DIAGNOSIS — R778 Other specified abnormalities of plasma proteins: Secondary | ICD-10-CM | POA: Diagnosis present

## 2015-08-28 DIAGNOSIS — Z8249 Family history of ischemic heart disease and other diseases of the circulatory system: Secondary | ICD-10-CM

## 2015-08-28 DIAGNOSIS — I2510 Atherosclerotic heart disease of native coronary artery without angina pectoris: Secondary | ICD-10-CM | POA: Diagnosis present

## 2015-08-28 DIAGNOSIS — R1110 Vomiting, unspecified: Secondary | ICD-10-CM

## 2015-08-28 DIAGNOSIS — Z903 Acquired absence of stomach [part of]: Secondary | ICD-10-CM

## 2015-08-28 DIAGNOSIS — K432 Incisional hernia without obstruction or gangrene: Secondary | ICD-10-CM | POA: Diagnosis present

## 2015-08-28 LAB — COMPREHENSIVE METABOLIC PANEL
ALT: 29 U/L (ref 17–63)
AST: 32 U/L (ref 15–41)
Albumin: 4 g/dL (ref 3.5–5.0)
Alkaline Phosphatase: 82 U/L (ref 38–126)
Anion gap: 10 (ref 5–15)
BILIRUBIN TOTAL: 1.3 mg/dL — AB (ref 0.3–1.2)
BUN: 25 mg/dL — AB (ref 6–20)
CHLORIDE: 101 mmol/L (ref 101–111)
CO2: 28 mmol/L (ref 22–32)
CREATININE: 1.61 mg/dL — AB (ref 0.61–1.24)
Calcium: 9 mg/dL (ref 8.9–10.3)
GFR calc Af Amer: 46 mL/min — ABNORMAL LOW (ref >60)
GFR, EST NON AFRICAN AMERICAN: 40 mL/min — AB (ref >60)
GLUCOSE: 141 mg/dL — AB (ref 65–99)
Potassium: 4.1 mmol/L (ref 3.5–5.1)
Sodium: 139 mmol/L (ref 135–145)
Total Protein: 8.1 g/dL (ref 6.5–8.1)

## 2015-08-28 LAB — URINALYSIS, ROUTINE W REFLEX MICROSCOPIC
Bilirubin Urine: NEGATIVE
GLUCOSE, UA: NEGATIVE mg/dL
Ketones, ur: NEGATIVE mg/dL
Leukocytes, UA: NEGATIVE
Nitrite: NEGATIVE
PH: 7 (ref 5.0–8.0)
Protein, ur: 100 mg/dL — AB
SPECIFIC GRAVITY, URINE: 1.015 (ref 1.005–1.030)

## 2015-08-28 LAB — CBC
HCT: 41.8 % (ref 39.0–52.0)
Hemoglobin: 13.6 g/dL (ref 13.0–17.0)
MCH: 29.3 pg (ref 26.0–34.0)
MCHC: 32.5 g/dL (ref 30.0–36.0)
MCV: 90.1 fL (ref 78.0–100.0)
PLATELETS: 226 10*3/uL (ref 150–400)
RBC: 4.64 MIL/uL (ref 4.22–5.81)
RDW: 14 % (ref 11.5–15.5)
WBC: 11 10*3/uL — AB (ref 4.0–10.5)

## 2015-08-28 LAB — URINE MICROSCOPIC-ADD ON

## 2015-08-28 LAB — PROTIME-INR
INR: 2.62 — ABNORMAL HIGH (ref 0.00–1.49)
PROTHROMBIN TIME: 27.6 s — AB (ref 11.6–15.2)

## 2015-08-28 LAB — TROPONIN I: Troponin I: 0.04 ng/mL — ABNORMAL HIGH (ref <0.031)

## 2015-08-28 LAB — LIPASE, BLOOD: LIPASE: 950 U/L — AB (ref 11–51)

## 2015-08-28 NOTE — ED Provider Notes (Signed)
CSN: [id number]     Arrival date & time 08/28/15  2051 History  By signing my name below, I, Robert [name], attest that this documentation has been prepared under the direction and in the presence of Deno Etienne, DO. Electronically Signed: Stephania [name], ED Scribe. 08/29/2015. 2:04 AM.    Chief Complaint  Patient presents with  . Abdominal Pain   Patient is a 75 y.o. [name] presenting with abdominal pain. The history is provided by the patient and a relative. No language interpreter was used.  Abdominal Pain Pain location:  Generalized Pain radiates to:  Does not radiate Pain severity:  Moderate Duration:  12 hours Timing:  Constant Context: not alcohol use   Relieved by:  None tried Worsened by:  Nothing tried Ineffective treatments:  None tried Associated symptoms: nausea and vomiting   Associated symptoms: no fever    HPI Comments: Robert BLAUSER is a 75 y.o. [name] with a history of hypertension, hyperlipidemia, chronic anticoagulation (Coumadin)  s/p MVR, BPH, CKD Stage III, gastic cancer s/p gastrectomy, and DM type 2, who presents to the Emergency Department complaining of constant, non-radiating, moderate generalized abdominal pain that began today, about 12 hours ago. He characterizes his pain as feeling like "a pile of sticks laying on my stomach." He also complains of associated nausea and vomiting. He denies any pain radiation to his back. Nothing makes his pain worse. Patient's family reports a history of mitral valve replacement and partial gastrectomy due to gastric cancer. Patient's relative denies EtOH consumption. Patient denies a history of any known gallbladder conditions. He also denies fever. Patient's brother, a Energy manager, also requests by phone that we perform an amylase test.    Past Medical History  Diagnosis Date  . Hypertension   . High cholesterol   . Chronic anticoagulation     coumadin for mechanical valves  . BPH (benign prostatic hyperplasia)   . CKD  (chronic kidney disease), stage III   . Gastric cancer ([place]) 2005    s/p gastrectomy  . Rheumatic heart disease   . HOH (hard of hearing)   . Diabetes mellitus type 2, diet-controlled ([place])   . S/P MVR (mitral valve replacement) 2015    39mm StJude mechanical valve   Past Surgical History  Procedure Laterality Date  . Mitral valve replacement  10/24/2013    32mm StJude mechanical valve  . Partial gastrectomy  2005  . Hernia repair    . Cardiac catheterization N/A 05/10/2015    Procedure: Right/Left Heart Cath and Coronary Angiography;  Surgeon: Burnell Blanks, MD;  Location: [place] [facility];  Service: Cardiovascular;  Laterality: N/A;   Family History  Problem Relation Age of Onset  . Hypertension Mother   . Diabetes Mother   . Cancer Brother    Social History  Substance Use Topics  . Smoking status: Never Smoker   . Smokeless tobacco: Never Used  . Alcohol Use: No    Review of Systems  Constitutional: Negative for fever.  Gastrointestinal: Positive for nausea, vomiting and abdominal pain.  All other systems reviewed and are negative.     Allergies  Penicillins and Vancomycin  Home Medications   Prior to Admission medications   Medication Sig Start Date End Date Taking? Authorizing Provider  aspirin 81 MG chewable tablet Chew 81 mg by mouth daily.   Yes Historical Provider, MD  atorvastatin (LIPITOR) 20 MG tablet Take 20 mg by mouth at bedtime.   Yes Historical Provider, MD  carvedilol (COREG) 6.25 MG tablet Take 1 tablet (6.25 mg total) by mouth 2 (two) times daily with a meal. 06/10/15  Yes Pixie Casino, MD  ferrous sulfate 325 (65 FE) MG tablet Take 1 tablet (325 mg total) by mouth 2 (two) times daily with a meal. 05/13/15  Yes Belkys A Regalado, MD  furosemide (LASIX) 40 MG tablet Take 1 tablet (40 mg total) by mouth daily. 08/19/15  Yes Pixie Casino, MD  isosorbide mononitrate (IMDUR) 30 MG Robert hr tablet Take 1 tablet (30 mg total) by mouth daily.  06/10/15  Yes Pixie Casino, MD  Multiple Vitamin (MULTIVITAMIN WITH MINERALS) TABS tablet Take 1 tablet by mouth daily.   Yes Historical Provider, MD  tamsulosin (FLOMAX) 0.4 MG CAPS capsule Take 1 capsule (0.4 mg total) by mouth daily after supper. 06/10/15  Yes Pixie Casino, MD  vitamin B-12 (CYANOCOBALAMIN) 1000 MCG tablet Take 1 tablet (1,000 mcg total) by mouth daily. 05/13/15  Yes Belkys A Regalado, MD  warfarin (COUMADIN) 3 MG tablet Take 1-1.5 tablets by mouth daily as directed by coumadin clinic Patient taking differently: Take 3-4.5 mg by mouth daily at 6 PM. Take 1 and 1/2 tablets on Monday and Friday then take 1 tablet all there other days 08/13/15  Yes Pixie Casino, MD   BP 228/105 mmHg  Pulse 88  Temp(Src) 97.7 F (36.5 C) (Oral)  Resp 18  Ht 5\' 6"  (1.676 m)  Wt 122 lb 2.2 oz (55.4 kg)  BMI 19.72 kg/m2  SpO2 96% Physical Exam  Constitutional: He is oriented to person, place, and time. He appears well-developed and well-nourished. No distress.  HENT:  Head: Normocephalic and atraumatic.  Eyes: Conjunctivae and EOM are normal.  Neck: Neck supple. No tracheal deviation present.  Cardiovascular: Normal rate.   Pulmonary/Chest: Effort normal. No respiratory distress.  Abdominal:  No focal tenderness. No RUQ tenderness.  Musculoskeletal: Normal range of motion.  Neurological: He is alert and oriented to person, place, and time.  Skin: Skin is warm and dry.  Psychiatric: He has a normal mood and affect. His behavior is normal.  Nursing note and vitals reviewed.   ED Course  Procedures (including critical care time)  DIAGNOSTIC STUDIES: Oxygen Saturation is 100% on RA, normal by my interpretation.    COORDINATION OF CARE: 12:42 AM - Discussed treatment plan with pt and his family at bedside which includes CT abdomen and possible hospital admission. Pt and his family verbalized understanding and agreed to plan.   Labs Review Labs Reviewed  LIPASE, BLOOD - Abnormal;  Notable for the following:    Lipase 950 (*)    All other components within normal limits  COMPREHENSIVE METABOLIC PANEL - Abnormal; Notable for the following:    Glucose, Bld 141 (*)    BUN 25 (*)    Creatinine, Ser 1.61 (*)    Total Bilirubin 1.3 (*)    GFR calc non Af Amer 40 (*)    GFR calc Af Amer 46 (*)    All other components within normal limits  CBC - Abnormal; Notable for the following:    WBC 11.0 (*)    All other components within normal limits  URINALYSIS, ROUTINE W REFLEX MICROSCOPIC (NOT AT [facility]) - Abnormal; Notable for the following:    Hgb urine dipstick MODERATE (*)    Protein, ur 100 (*)    All other components within normal limits  PROTIME-INR - Abnormal; Notable for the following:  Prothrombin Time 27.6 (*)    INR 2.62 (*)    All other components within normal limits  TROPONIN I - Abnormal; Notable for the following:    Troponin I 0.04 (*)    All other components within normal limits  URINE MICROSCOPIC-ADD ON - Abnormal; Notable for the following:    Squamous Epithelial / LPF 0-5 (*)    Bacteria, UA RARE (*)    Casts HYALINE CASTS (*)    All other components within normal limits  AMYLASE - Abnormal; Notable for the following:    Amylase 979 (*)    All other components within normal limits  LACTATE DEHYDROGENASE - Abnormal; Notable for the following:    LDH 373 (*)    All other components within normal limits  BRAIN NATRIURETIC PEPTIDE - Abnormal; Notable for the following:    B Natriuretic Peptide 816.0 (*)    All other components within normal limits  TROPONIN I - Abnormal; Notable for the following:    Troponin I 0.09 (*)    All other components within normal limits  COMPREHENSIVE METABOLIC PANEL - Abnormal; Notable for the following:    Glucose, Bld 133 (*)    BUN Robert (*)    Creatinine, Ser 1.50 (*)    Calcium 8.3 (*)    Albumin 3.4 (*)    Total Bilirubin 1.4 (*)    GFR calc non Af Amer 43 (*)    GFR calc Af Amer 50 (*)    All other  components within normal limits  CBC - Abnormal; Notable for the following:    WBC 11.1 (*)    All other components within normal limits  PROTIME-INR - Abnormal; Notable for the following:    Prothrombin Time 26.0 (*)    INR 2.42 (*)    All other components within normal limits  LACTIC ACID, PLASMA  LIPID PANEL  APTT  TROPONIN I  TROPONIN I  HEMOGLOBIN A1C  TYPE AND SCREEN  ABO/RH    Imaging Review Ct Abdomen Pelvis W Contrast  08/29/2015  CLINICAL DATA:  Abdominal pain and vomiting since yesterday. New onset pancreatitis. Previous history of partial gastrectomy. EXAM: CT ABDOMEN AND PELVIS WITH CONTRAST TECHNIQUE: Multidetector CT imaging of the abdomen and pelvis was performed using the standard protocol following bolus administration of intravenous contrast. CONTRAST:  75 ml ISOVUE-300 IOPAMIDOL (ISOVUE-300) INJECTION 61% COMPARISON:  PET-CT 01/17/2005 FINDINGS: Dependent changes in the lung bases. Cardiac enlargement with dilated left atrium. Mitral valve replacement. Small esophageal hiatal hernia. 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 The liver, gallbladder, pancreas, adrenal glands, abdominal aorta, inferior vena cava, kidneys, and retroperitoneal lymph nodes are unremarkable. Postoperative changes in the stomach consistent with partial gastrectomy and jejunal anastomosis. Dilated fluid-filled proximal small bowel with decompressed bowel distally. Transition zone appears to be in the left upper quadrant and may represent internal hernia or a adhesion. Small ventral abdominal hernia containing fat. Scattered stool in the colon without colonic distention. Small amount of free fluid in the pelvis along the liver edge and pericolic gutter. No free air. Pelvis: The appendix is normal. Bladder wall is not thickened. Prostate gland is enlarged, measuring 4.3 cm diameter. Small right-sided bladder diverticulum and mildly distended bladder  may represent chronic bladder outflow obstruction. No free or loculated pelvic fluid collections. No pelvic mass or lymphadenopathy. No evidence of diverticulitis. No destructive bone lesions. Small bowel obstruction with transition zone in the left upper quadrant, likely  representing internal hernia or adhesion. Small ventral anterior abdominal wall hernia containing fat. Small amount of free fluid in the abdomen may be reactive. Prostate enlargement with distended bladder and bladder diverticulum may indicate bladder outflow obstruction. Electronically Signed   By: Lucienne Capers M.D.   On: 08/29/2015 02:48   I have personally reviewed and evaluated these images and lab results as part of my medical decision-making.  ED ECG REPORT   Date: 08/29/2015  Rate: 72  Rhythm: normal sinus rhythm  QRS Axis: normal  Intervals: normal  ST/T Wave abnormalities: nonspecific ST/T changes  Conduction Disutrbances:none  Narrative Interpretation:   Old EKG Reviewed: unchanged  I have personally reviewed the EKG tracing and agree with the computerized printout as noted.   MDM   Final diagnoses:  Encounter for imaging study to confirm nasogastric (NG) tube placement  Small bowel obstruction [facility])    75 yo M With a chief complaint of abdominal pain. Going on for the past couple days. On my exam patient is ill-appearing. Benign abdominal exam. He has pancreatitis by laboratory values. No noted LFT involvement. CT scan concerning for small bowel obstruction. Discussed with general surgery and hospitalist will admit.  I personally performed the services described in this documentation, which was scribed in my presence. The recorded information has been reviewed and is accurate.  The patients results and plan were reviewed and discussed.   Any x-rays performed were independently reviewed by myself.   Differential diagnosis were considered with the presenting HPI.  Medications  hydrALAZINE (APRESOLINE)  injection 5 mg (5 mg Intravenous Given 08/29/15 0601)  morphine 2 MG/ML injection 2 mg (2 mg Intravenous Given 08/29/15 0547)  ondansetron (ZOFRAN) injection 4 mg (not administered)  metoprolol (LOPRESSOR) injection 5 mg (5 mg Intravenous Given 08/29/15 0601)  sodium chloride flush (NS) 0.9 % injection 3 mL (not administered)  acetaminophen (TYLENOL) tablet 650 mg (not administered)    Or  acetaminophen (TYLENOL) suppository 650 mg (not administered)  diatrizoate meglumine-sodium (GASTROGRAFIN) 66-10 % solution 90 mL (not administered)  morphine 4 MG/ML injection 4 mg (4 mg Intravenous Given 08/29/15 0054)  ondansetron (ZOFRAN) injection 4 mg (4 mg Intravenous Given 08/29/15 0054)  sodium chloride 0.9 % bolus 1,000 mL (0 mLs Intravenous Stopped 08/29/15 0304)  iopamidol (ISOVUE-300) 61 % injection (75 mLs  Contrast Given 08/29/15 0221)    Filed Vitals:   08/29/15 0200 08/29/15 0400 08/29/15 0425 08/29/15 0526  BP: 195/97  211/103 228/105  Pulse: 81  91 88  Temp:    97.7 F (36.5 C)  TempSrc:    Oral  Resp: 17  15 18   Height:  5\' 6"  (1.676 m)    Weight:  124 lb 1.9 oz (56.3 kg)  122 lb 2.2 oz (55.4 kg)  SpO2: 97%  97% 96%    Final diagnoses:  Encounter for imaging study to confirm nasogastric (NG) tube placement  Small bowel obstruction ([place])    Admission/ observation were discussed with the admitting physician, patient and/or family and they are comfortable with the plan.       Deno Etienne, DO 08/29/15 [phone]

## 2015-08-28 NOTE — ED Notes (Signed)
The pt has had abd pain all day getting worse with n v and diarrhea.  C/o weakness

## 2015-08-29 ENCOUNTER — Other Ambulatory Visit: Payer: Self-pay

## 2015-08-29 ENCOUNTER — Inpatient Hospital Stay (HOSPITAL_COMMUNITY): Payer: BC Managed Care – PPO

## 2015-08-29 ENCOUNTER — Emergency Department (HOSPITAL_COMMUNITY): Payer: BC Managed Care – PPO

## 2015-08-29 ENCOUNTER — Encounter (HOSPITAL_COMMUNITY): Payer: Self-pay | Admitting: Radiology

## 2015-08-29 DIAGNOSIS — Z833 Family history of diabetes mellitus: Secondary | ICD-10-CM | POA: Diagnosis not present

## 2015-08-29 DIAGNOSIS — K432 Incisional hernia without obstruction or gangrene: Secondary | ICD-10-CM | POA: Diagnosis present

## 2015-08-29 DIAGNOSIS — K5669 Other intestinal obstruction: Secondary | ICD-10-CM | POA: Diagnosis not present

## 2015-08-29 DIAGNOSIS — Z881 Allergy status to other antibiotic agents status: Secondary | ICD-10-CM | POA: Diagnosis not present

## 2015-08-29 DIAGNOSIS — H9190 Unspecified hearing loss, unspecified ear: Secondary | ICD-10-CM | POA: Diagnosis present

## 2015-08-29 DIAGNOSIS — D735 Infarction of spleen: Secondary | ICD-10-CM | POA: Diagnosis present

## 2015-08-29 DIAGNOSIS — N400 Enlarged prostate without lower urinary tract symptoms: Secondary | ICD-10-CM | POA: Diagnosis present

## 2015-08-29 DIAGNOSIS — Z79899 Other long term (current) drug therapy: Secondary | ICD-10-CM | POA: Diagnosis not present

## 2015-08-29 DIAGNOSIS — E561 Deficiency of vitamin K: Secondary | ICD-10-CM | POA: Diagnosis present

## 2015-08-29 DIAGNOSIS — Z809 Family history of malignant neoplasm, unspecified: Secondary | ICD-10-CM | POA: Diagnosis not present

## 2015-08-29 DIAGNOSIS — R778 Other specified abnormalities of plasma proteins: Secondary | ICD-10-CM | POA: Diagnosis present

## 2015-08-29 DIAGNOSIS — I2510 Atherosclerotic heart disease of native coronary artery without angina pectoris: Secondary | ICD-10-CM | POA: Diagnosis present

## 2015-08-29 DIAGNOSIS — R7989 Other specified abnormal findings of blood chemistry: Secondary | ICD-10-CM | POA: Diagnosis not present

## 2015-08-29 DIAGNOSIS — Z8249 Family history of ischemic heart disease and other diseases of the circulatory system: Secondary | ICD-10-CM | POA: Diagnosis not present

## 2015-08-29 DIAGNOSIS — I5022 Chronic systolic (congestive) heart failure: Secondary | ICD-10-CM | POA: Diagnosis present

## 2015-08-29 DIAGNOSIS — Z7901 Long term (current) use of anticoagulants: Secondary | ICD-10-CM | POA: Diagnosis not present

## 2015-08-29 DIAGNOSIS — Z903 Acquired absence of stomach [part of]: Secondary | ICD-10-CM | POA: Diagnosis not present

## 2015-08-29 DIAGNOSIS — Z7982 Long term (current) use of aspirin: Secondary | ICD-10-CM | POA: Diagnosis not present

## 2015-08-29 DIAGNOSIS — K5660 Unspecified intestinal obstruction: Secondary | ICD-10-CM | POA: Diagnosis present

## 2015-08-29 DIAGNOSIS — I130 Hypertensive heart and chronic kidney disease with heart failure and stage 1 through stage 4 chronic kidney disease, or unspecified chronic kidney disease: Secondary | ICD-10-CM | POA: Diagnosis present

## 2015-08-29 DIAGNOSIS — I160 Hypertensive urgency: Secondary | ICD-10-CM | POA: Diagnosis present

## 2015-08-29 DIAGNOSIS — Z681 Body mass index (BMI) 19 or less, adult: Secondary | ICD-10-CM | POA: Diagnosis not present

## 2015-08-29 DIAGNOSIS — E785 Hyperlipidemia, unspecified: Secondary | ICD-10-CM | POA: Diagnosis present

## 2015-08-29 DIAGNOSIS — I248 Other forms of acute ischemic heart disease: Secondary | ICD-10-CM | POA: Diagnosis present

## 2015-08-29 DIAGNOSIS — E43 Unspecified severe protein-calorie malnutrition: Secondary | ICD-10-CM | POA: Diagnosis present

## 2015-08-29 DIAGNOSIS — N179 Acute kidney failure, unspecified: Secondary | ICD-10-CM | POA: Diagnosis present

## 2015-08-29 DIAGNOSIS — N183 Chronic kidney disease, stage 3 (moderate): Secondary | ICD-10-CM | POA: Diagnosis present

## 2015-08-29 DIAGNOSIS — Z952 Presence of prosthetic heart valve: Secondary | ICD-10-CM | POA: Diagnosis not present

## 2015-08-29 DIAGNOSIS — E1122 Type 2 diabetes mellitus with diabetic chronic kidney disease: Secondary | ICD-10-CM | POA: Diagnosis present

## 2015-08-29 DIAGNOSIS — E7800 Pure hypercholesterolemia, unspecified: Secondary | ICD-10-CM | POA: Diagnosis present

## 2015-08-29 DIAGNOSIS — Z85028 Personal history of other malignant neoplasm of stomach: Secondary | ICD-10-CM | POA: Diagnosis not present

## 2015-08-29 DIAGNOSIS — I471 Supraventricular tachycardia: Secondary | ICD-10-CM | POA: Diagnosis not present

## 2015-08-29 DIAGNOSIS — K56609 Unspecified intestinal obstruction, unspecified as to partial versus complete obstruction: Secondary | ICD-10-CM | POA: Diagnosis present

## 2015-08-29 DIAGNOSIS — Z954 Presence of other heart-valve replacement: Secondary | ICD-10-CM | POA: Diagnosis not present

## 2015-08-29 DIAGNOSIS — K859 Acute pancreatitis without necrosis or infection, unspecified: Secondary | ICD-10-CM | POA: Diagnosis present

## 2015-08-29 DIAGNOSIS — I4581 Long QT syndrome: Secondary | ICD-10-CM | POA: Diagnosis present

## 2015-08-29 DIAGNOSIS — I5023 Acute on chronic systolic (congestive) heart failure: Secondary | ICD-10-CM | POA: Diagnosis not present

## 2015-08-29 DIAGNOSIS — Z880 Allergy status to penicillin: Secondary | ICD-10-CM | POA: Diagnosis not present

## 2015-08-29 LAB — COMPREHENSIVE METABOLIC PANEL
ALBUMIN: 3.4 g/dL — AB (ref 3.5–5.0)
ALK PHOS: 75 U/L (ref 38–126)
ALT: 22 U/L (ref 17–63)
ANION GAP: 11 (ref 5–15)
AST: 32 U/L (ref 15–41)
BUN: 24 mg/dL — ABNORMAL HIGH (ref 6–20)
CALCIUM: 8.3 mg/dL — AB (ref 8.9–10.3)
CO2: 26 mmol/L (ref 22–32)
Chloride: 103 mmol/L (ref 101–111)
Creatinine, Ser: 1.5 mg/dL — ABNORMAL HIGH (ref 0.61–1.24)
GFR calc non Af Amer: 43 mL/min — ABNORMAL LOW (ref >60)
GFR, EST AFRICAN AMERICAN: 50 mL/min — AB (ref >60)
GLUCOSE: 133 mg/dL — AB (ref 65–99)
POTASSIUM: 4 mmol/L (ref 3.5–5.1)
SODIUM: 140 mmol/L (ref 135–145)
TOTAL PROTEIN: 7.1 g/dL (ref 6.5–8.1)
Total Bilirubin: 1.4 mg/dL — ABNORMAL HIGH (ref 0.3–1.2)

## 2015-08-29 LAB — CBC
HEMATOCRIT: 41.9 % (ref 39.0–52.0)
HEMOGLOBIN: 13.4 g/dL (ref 13.0–17.0)
MCH: 28.9 pg (ref 26.0–34.0)
MCHC: 32 g/dL (ref 30.0–36.0)
MCV: 90.3 fL (ref 78.0–100.0)
Platelets: 206 10*3/uL (ref 150–400)
RBC: 4.64 MIL/uL (ref 4.22–5.81)
RDW: 14.1 % (ref 11.5–15.5)
WBC: 11.1 10*3/uL — ABNORMAL HIGH (ref 4.0–10.5)

## 2015-08-29 LAB — LIPID PANEL
CHOL/HDL RATIO: 3.3 ratio
Cholesterol: 148 mg/dL (ref 0–200)
HDL: 45 mg/dL (ref >40)
LDL CALC: 92 mg/dL (ref 0–99)
TRIGLYCERIDES: 54 mg/dL (ref <150)
VLDL: 11 mg/dL (ref 0–40)

## 2015-08-29 LAB — TROPONIN I
TROPONIN I: 0.09 ng/mL — AB (ref <0.031)
TROPONIN I: 0.37 ng/mL — AB (ref <0.031)
Troponin I: 0.23 ng/mL — ABNORMAL HIGH (ref <0.031)
Troponin I: 0.31 ng/mL — ABNORMAL HIGH (ref <0.031)

## 2015-08-29 LAB — PROTIME-INR
INR: 2.42 — AB (ref 0.00–1.49)
PROTHROMBIN TIME: 26 s — AB (ref 11.6–15.2)

## 2015-08-29 LAB — TYPE AND SCREEN
ABO/RH(D): O POS
ANTIBODY SCREEN: NEGATIVE

## 2015-08-29 LAB — BRAIN NATRIURETIC PEPTIDE: B Natriuretic Peptide: 816 pg/mL — ABNORMAL HIGH (ref 0.0–100.0)

## 2015-08-29 LAB — LIPASE, BLOOD: Lipase: 112 U/L — ABNORMAL HIGH (ref 11–51)

## 2015-08-29 LAB — GLUCOSE, CAPILLARY
GLUCOSE-CAPILLARY: 115 mg/dL — AB (ref 65–99)
Glucose-Capillary: 136 mg/dL — ABNORMAL HIGH (ref 65–99)
Glucose-Capillary: 136 mg/dL — ABNORMAL HIGH (ref 65–99)
Glucose-Capillary: 127 mg/dL — ABNORMAL HIGH (ref 65–99)

## 2015-08-29 LAB — ABO/RH: ABO/RH(D): O POS

## 2015-08-29 LAB — AMYLASE: AMYLASE: 979 U/L — AB (ref 28–100)

## 2015-08-29 LAB — LACTIC ACID, PLASMA: LACTIC ACID, VENOUS: 1.3 mmol/L (ref 0.5–2.0)

## 2015-08-29 LAB — LACTATE DEHYDROGENASE: LDH: 373 U/L — AB (ref 98–192)

## 2015-08-29 LAB — APTT: APTT: 35 s (ref 24–37)

## 2015-08-29 LAB — HEPARIN LEVEL (UNFRACTIONATED): HEPARIN UNFRACTIONATED: 0.52 [IU]/mL (ref 0.30–0.70)

## 2015-08-29 MED ORDER — METOPROLOL TARTRATE 5 MG/5ML IV SOLN
5.0000 mg | Freq: Three times a day (TID) | INTRAVENOUS | Status: DC
  Administered 2015-08-29: 5 mg via INTRAVENOUS
  Filled 2015-08-29: qty 5

## 2015-08-29 MED ORDER — NITROGLYCERIN IN D5W 200-5 MCG/ML-% IV SOLN
0.0000 ug/min | INTRAVENOUS | Status: DC
  Filled 2015-08-29: qty 250

## 2015-08-29 MED ORDER — IOPAMIDOL (ISOVUE-300) INJECTION 61%
INTRAVENOUS | Status: AC
  Administered 2015-08-29: 75 mL
  Filled 2015-08-29: qty 75

## 2015-08-29 MED ORDER — ONDANSETRON HCL 4 MG/2ML IJ SOLN: 4.0000 mg | Freq: Three times a day (TID) | INTRAMUSCULAR | Status: DC | PRN

## 2015-08-29 MED ORDER — METOPROLOL TARTRATE 5 MG/5ML IV SOLN
5.0000 mg | INTRAVENOUS | Status: AC
Start: 2015-08-29 — End: 2015-08-29
  Administered 2015-08-29: 5 mg via INTRAVENOUS
  Filled 2015-08-29: qty 5

## 2015-08-29 MED ORDER — SODIUM CHLORIDE 0.9% FLUSH
3.0000 mL | Freq: Two times a day (BID) | INTRAVENOUS | Status: DC
  Administered 2015-08-29 – 2015-09-03 (×5): 3 mL via INTRAVENOUS

## 2015-08-29 MED ORDER — NITROGLYCERIN 2 % TD OINT
1.0000 [in_us] | TOPICAL_OINTMENT | Freq: Four times a day (QID) | TRANSDERMAL | Status: DC
  Administered 2015-08-29 – 2015-09-03 (×18): 1 [in_us] via TOPICAL
  Filled 2015-08-29: qty 30

## 2015-08-29 MED ORDER — MORPHINE SULFATE (PF) 2 MG/ML IV SOLN
2.0000 mg | INTRAVENOUS | Status: DC | PRN
  Administered 2015-08-29 – 2015-08-30 (×5): 2 mg via INTRAVENOUS
  Filled 2015-08-29 (×6): qty 1

## 2015-08-29 MED ORDER — HYDRALAZINE HCL 20 MG/ML IJ SOLN
10.0000 mg | INTRAMUSCULAR | Status: DC | PRN
  Administered 2015-08-30 – 2015-09-02 (×3): 10 mg via INTRAVENOUS
  Filled 2015-08-29 (×3): qty 1

## 2015-08-29 MED ORDER — ACETAMINOPHEN 325 MG PO TABS
650.0000 mg | ORAL_TABLET | Freq: Four times a day (QID) | ORAL | Status: DC | PRN
  Administered 2015-09-01 – 2015-09-03 (×2): 650 mg via ORAL
  Filled 2015-08-29 (×2): qty 2

## 2015-08-29 MED ORDER — MORPHINE SULFATE (PF) 4 MG/ML IV SOLN
4.0000 mg | Freq: Once | INTRAVENOUS | Status: AC
  Administered 2015-08-29: 4 mg via INTRAVENOUS
  Filled 2015-08-29: qty 1

## 2015-08-29 MED ORDER — CETYLPYRIDINIUM CHLORIDE 0.05 % MT LIQD
7.0000 mL | Freq: Two times a day (BID) | OROMUCOSAL | Status: DC
  Administered 2015-08-29 – 2015-09-03 (×11): 7 mL via OROMUCOSAL

## 2015-08-29 MED ORDER — HYDRALAZINE HCL 20 MG/ML IJ SOLN
5.0000 mg | INTRAMUSCULAR | Status: DC | PRN
  Administered 2015-08-29: 5 mg via INTRAVENOUS
  Filled 2015-08-29: qty 1

## 2015-08-29 MED ORDER — ONDANSETRON HCL 4 MG/2ML IJ SOLN
4.0000 mg | Freq: Once | INTRAMUSCULAR | Status: AC
  Administered 2015-08-29: 4 mg via INTRAVENOUS
  Filled 2015-08-29: qty 2

## 2015-08-29 MED ORDER — HEPARIN (PORCINE) IN NACL 100-0.45 UNIT/ML-% IJ SOLN
800.0000 [IU]/h | INTRAMUSCULAR | Status: DC
  Administered 2015-08-29: 900 [IU]/h via INTRAVENOUS
  Administered 2015-08-30: 850 [IU]/h via INTRAVENOUS
  Administered 2015-08-31: 800 [IU]/h via INTRAVENOUS
  Filled 2015-08-29 (×3): qty 250

## 2015-08-29 MED ORDER — ACETAMINOPHEN 650 MG RE SUPP: 650.0000 mg | Freq: Four times a day (QID) | RECTAL | Status: DC | PRN

## 2015-08-29 MED ORDER — SODIUM CHLORIDE 0.9 % IV BOLUS (SEPSIS)
1000.0000 mL | Freq: Once | INTRAVENOUS | Status: AC
  Administered 2015-08-29: 1000 mL via INTRAVENOUS

## 2015-08-29 MED ORDER — METOPROLOL TARTRATE 5 MG/5ML IV SOLN
10.0000 mg | Freq: Three times a day (TID) | INTRAVENOUS | Status: DC
  Administered 2015-08-29 – 2015-09-01 (×9): 10 mg via INTRAVENOUS
  Filled 2015-08-29 (×9): qty 10

## 2015-08-29 MED ORDER — DIATRIZOATE MEGLUMINE & SODIUM 66-10 % PO SOLN
90.0000 mL | Freq: Once | ORAL | Status: AC
  Administered 2015-08-29: 90 mL via NASOGASTRIC
  Filled 2015-08-29: qty 90

## 2015-08-29 NOTE — Progress Notes (Signed)
NG tube out. Pt tolerated well. Clear liquids given. Will continue to monitor.

## 2015-08-29 NOTE — Progress Notes (Signed)
Late entry  #16 ng tube placed without difficulty.Patient tolerated procedure well.SBOprotocol initiated.

## 2015-08-29 NOTE — Evaluation (Signed)
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  Clinical Impression  Pt admitted with above diagnosis. Pt currently with functional limitations due to the deficits listed below (see PT Problem List). Pt ambulated 65' in hall with RW and min-guard A, very fatigued with all activity.  Pt will benefit from skilled PT to increase their independence and safety with mobility to allow discharge to the venue listed below.       Follow Up Recommendations No PT follow up    Equipment Recommendations  None recommended by PT    Recommendations for Other Services       Precautions / Restrictions Precautions Precautions: Fall;Other (comment) Precaution Comments: NG tube Restrictions Weight Bearing Restrictions: No      Mobility  Bed Mobility Overal bed mobility: Modified Independent             General bed mobility comments: able to get to EOB with increased time, painful mvmt  Transfers Overall transfer level: Needs assistance Equipment used: Rolling walker (2 wheeled) Transfers: Sit to/from Stand Sit to Stand: Min guard         General transfer comment: min-guard for safety but pt with sufficient strength to rise, very fatigued throughout all mobility  Ambulation/Gait Ambulation/Gait assistance: Min guard Ambulation Distance (Feet): 80 Feet Assistive device: Rolling walker (2 wheeled);1 person hand held assist Gait Pattern/deviations: Step-through pattern;Decreased stride length;Shuffle;Trunk flexed Gait velocity: decreased Gait velocity interpretation: Below normal speed for age/gender General Gait Details: HHA within room, RW in hall for energy conservation,  min-guard for safety, no LOB but fatigue evident  Stairs            Wheelchair Mobility    Modified Rankin (Stroke Patients Only)       Balance Overall balance assessment: Needs assistance Sitting-balance support: No upper extremity supported Sitting balance-Leahy Scale: Good     Standing balance support: No upper extremity supported Standing balance-Leahy Scale: Fair Standing balance comment: reaches for stable surface during dynamic activity                             Pertinent Vitals/Pain Pain Assessment: Faces Faces Pain Scale: Hurts even more Pain Location: abdomen Pain Descriptors / Indicators: Grimacing Pain Intervention(s): Limited activity within patient's tolerance;Repositioned         O2 sats 96%, HR stable throughout  Home Living Family/patient expects to be discharged to:: Private residence Living Arrangements: Spouse/significant other Available Help at Discharge: Family;Available 24 hours/day Type of Home: House Home Access: Level entry     Home Layout: Two level Home Equipment: None      Prior Function Level of Independence: Independent         Comments: pt is professor of Math at [employer]        Extremity/Trunk Assessment   Upper Extremity Assessment: Generalized weakness           Lower Extremity Assessment: Generalized weakness      Cervical / Trunk Assessment: Kyphotic  Communication   Communication: No difficulties;Other (comment) (is from [place], accent but fluent English)  Cognition Arousal/Alertness: Lethargic Behavior During Therapy: WFL for tasks assessed/performed  Overall Cognitive Status: Within Functional Limits for tasks assessed                      General Comments      Exercises General Exercises - Lower Extremity Hip Flexion/Marching: 5 reps;Standing Mini-Sqauts: 5 reps;Standing      Assessment/Plan    PT Assessment Patient needs continued PT  services  PT Diagnosis Difficulty walking;Generalized weakness;Acute pain   PT Problem List Decreased strength;Decreased activity tolerance;Decreased balance;Decreased mobility;Decreased knowledge of use of DME;Decreased knowledge of precautions;Pain  PT Treatment Interventions DME instruction;Gait training;Stair training;Functional mobility training;Therapeutic activities;Therapeutic exercise;Balance training;Patient/family education   PT Goals (Current goals can be found in the Care Plan section) Acute Rehab PT Goals Patient Stated Goal: return to home and teaching PT Goal Formulation: With patient Time For Goal Achievement: 09/12/15 Potential to Achieve Goals: Good    Frequency Min 3X/week   Barriers to discharge Inaccessible home environment upstairs bedroom    Co-evaluation               End of Session   Activity Tolerance: Patient limited by fatigue Patient left: in chair;with call bell/phone within reach;with family/visitor present Nurse Communication: Mobility status         Time: [id number] PT Time Calculation (min) (ACUTE ONLY): 33 min   Charges:   PT Evaluation $PT Eval Moderate Complexity: 1 Procedure PT Treatments $Gait Training: 8-22 mins   PT G Codes:       Leighton Roach, PT  Acute Rehab Services  [phone]  Leighton Roach 08/29/2015, 12:37 PM

## 2015-08-29 NOTE — Progress Notes (Signed)
New Admission Note:   Arrival Method: stretcher Mental Orientation: alert and oriented x 4  Telemetry:yes Assessment: Completed Skin:intact [id number] a/c nsl Pain:  Tubes: Safety Measures: Safety Fall Prevention Plan has been discussed  .  Admission: Completed [facility] [place] Orientation: Patient has been orientated to the room, unit and staff.   New Admission Note:   Arrival Method:  Mental Orientation:

## 2015-08-29 NOTE — Consult Note (Signed)
Reason for Consult:sbo Referring Physician: Dr Robert [name] is an 75 y.o. male.  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 Pain was constant, didn't go away, developed n/v so came to ED. Last BM was Saturday morning. +burping.  Has had some mild postprandial upper abd pain for awhile but is very limited and goes away rather quickly o/w no previous symptoms. No melena/hematochezia.   Denies cp/sob/doe/edema.   Past Medical History  Diagnosis Date  . Hypertension   . High cholesterol   . Chronic anticoagulation     coumadin for mechanical valves  . BPH (benign prostatic hyperplasia)   . CKD (chronic kidney disease), stage III   . Gastric cancer ([place]) 2005    s/p gastrectomy  . Rheumatic heart disease   . HOH (hard of hearing)   . Diabetes mellitus type 2, diet-controlled ([place])   . S/P MVR (mitral valve replacement) 2015    20m StJude mechanical valve    Past Surgical History  Procedure Laterality Date  . Mitral valve replacement  10/24/2013    23mStJude mechanical valve  . Partial gastrectomy  2005  . Hernia repair    . Cardiac catheterization N/A 05/10/2015    Procedure: Right/Left Heart Cath and Coronary Angiography;  Surgeon: ChBurnell BlanksMD;  Location: [facility];  Service: Cardiovascular;  Laterality: N/A;    Family History  Problem Relation Age of Onset  . Hypertension Mother   . Diabetes Mother   . Cancer Brother     Social History:  reports that he has never smoked. He has never used smokeless tobacco. He reports that he does not drink alcohol or use illicit drugs.  Allergies:  Allergies  Allergen Reactions  . Penicillins Other (See Comments)  . Vancomycin Other (See Comments)    Kidney problems    Medications: I have reviewed the patient's current  medications.  Results for orders placed or performed during the hospital encounter of 08/28/15 (from the past 48 hour(s))  Lipase, blood     Status: Abnormal   Collection Time: 08/28/15  9:11 PM  Result Value Ref Range   Lipase 950 (H) 11 - 51 U/L    Comment: RESULTS CONFIRMED BY MANUAL DILUTION  Comprehensive metabolic panel     Status: Abnormal   Collection Time: 08/28/15  9:11 PM  Result Value Ref Range   Sodium 139 135 - 145 mmol/L   Potassium 4.1 3.5 - 5.1 mmol/L   Chloride 101 101 - 111 mmol/L   CO2 28 22 - 32 mmol/L   Glucose, Bld 141 (H) 65 - 99 mg/dL   BUN 25 (H) 6 - 20 mg/dL   Creatinine, Ser 1.61 (H) 0.61 - 1.24 mg/dL   Calcium 9.0 8.9 - 10.3 mg/dL   Total Protein 8.1 6.5 - 8.1 g/dL   Albumin 4.0 3.5 - 5.0 g/dL   AST 32 15 - 41 U/L   ALT 29 17 - 63 U/L   Alkaline Phosphatase 82 38 - 126 U/L   Total Bilirubin 1.3 (H) 0.3 - 1.2 mg/dL   GFR calc non Af Amer 40 (L) >60 mL/min   GFR calc Af Amer 46 (L) >60 mL/min    Comment: (NOTE) The eGFR has been calculated using the CKD EPI equation. This calculation has not been validated in  all clinical situations. eGFR's persistently <60 mL/min signify possible Chronic Kidney Disease.    Anion gap 10 5 - 15  CBC     Status: Abnormal   Collection Time: 08/28/15  9:11 PM  Result Value Ref Range   WBC 11.0 (H) 4.0 - 10.5 K/uL   RBC 4.64 4.22 - 5.81 MIL/uL   Hemoglobin 13.6 13.0 - 17.0 g/dL   HCT 41.8 39.0 - 52.0 %   MCV 90.1 78.0 - 100.0 fL   MCH 29.3 26.0 - 34.0 pg   MCHC 32.5 30.0 - 36.0 g/dL   RDW 14.0 11.5 - 15.5 %   Platelets 226 150 - 400 K/uL  Protime-INR     Status: Abnormal   Collection Time: 08/28/15  9:11 PM  Result Value Ref Range   Prothrombin Time 27.6 (H) 11.6 - 15.2 seconds   INR 2.62 (H) 0.00 - 1.49  Troponin I     Status: Abnormal   Collection Time: 08/28/15  9:11 PM  Result Value Ref Range   Troponin I 0.04 (H) <0.031 ng/mL    Comment:        PERSISTENTLY INCREASED TROPONIN VALUES IN THE RANGE OF  0.04-0.49 ng/mL CAN BE SEEN IN:       -UNSTABLE ANGINA       -CONGESTIVE HEART FAILURE       -MYOCARDITIS       -CHEST TRAUMA       -ARRYHTHMIAS       -LATE PRESENTING MYOCARDIAL INFARCTION       -COPD   CLINICAL FOLLOW-UP RECOMMENDED.   Urinalysis, Routine w reflex microscopic     Status: Abnormal   Collection Time: 08/28/15  9:17 PM  Result Value Ref Range   Color, Urine YELLOW YELLOW   APPearance CLEAR CLEAR   Specific Gravity, Urine 1.015 1.005 - 1.030   pH 7.0 5.0 - 8.0   Glucose, UA NEGATIVE NEGATIVE mg/dL   Hgb urine dipstick MODERATE (A) NEGATIVE   Bilirubin Urine NEGATIVE NEGATIVE   Ketones, ur NEGATIVE NEGATIVE mg/dL   Protein, ur 100 (A) NEGATIVE mg/dL   Nitrite NEGATIVE NEGATIVE   Leukocytes, UA NEGATIVE NEGATIVE  Urine microscopic-add on     Status: Abnormal   Collection Time: 08/28/15  9:17 PM  Result Value Ref Range   Squamous Epithelial / LPF 0-5 (A) NONE SEEN   WBC, UA 0-5 0 - 5 WBC/hpf   RBC / HPF 6-30 0 - 5 RBC/hpf   Bacteria, UA RARE (A) NONE SEEN   Casts HYALINE CASTS (A) NEGATIVE  Amylase     Status: Abnormal   Collection Time: 08/29/15 12:53 AM  Result Value Ref Range   Amylase 979 (H) 28 - 100 U/L  Lactate dehydrogenase     Status: Abnormal   Collection Time: 08/29/15 12:53 AM  Result Value Ref Range   LDH 373 (H) 98 - 192 U/L  Lactic acid, plasma     Status: None   Collection Time: 08/29/15  4:13 AM  Result Value Ref Range   Lactic Acid, Venous 1.3 0.5 - 2.0 mmol/L  Type and screen [place]     Status: None   Collection Time: 08/29/15  4:13 AM  Result Value Ref Range   ABO/RH(D) O POS    Antibody Screen NEG    Sample Expiration 09/01/2015   ABO/Rh     Status: None (Preliminary result)   Collection Time: 08/29/15  4:13 AM  Result Value Ref Range  ABO/RH(D) O POS   Troponin I (q 6hr x 3)     Status: Abnormal   Collection Time: 08/29/15  4:14 AM  Result Value Ref Range   Troponin I 0.09 (H) <0.031 ng/mL    Comment:         PERSISTENTLY INCREASED TROPONIN VALUES IN THE RANGE OF 0.04-0.49 ng/mL CAN BE SEEN IN:       -UNSTABLE ANGINA       -CONGESTIVE HEART FAILURE       -MYOCARDITIS       -CHEST TRAUMA       -ARRYHTHMIAS       -LATE PRESENTING MYOCARDIAL INFARCTION       -COPD   CLINICAL FOLLOW-UP RECOMMENDED.   Lipid panel     Status: None   Collection Time: 08/29/15  4:14 AM  Result Value Ref Range   Cholesterol 148 0 - 200 mg/dL   Triglycerides 54 <150 mg/dL   HDL 45 >40 mg/dL   Total CHOL/HDL Ratio 3.3 RATIO   VLDL 11 0 - 40 mg/dL   LDL Cholesterol 92 0 - 99 mg/dL    Comment:        Total Cholesterol/HDL:CHD Risk Coronary Heart Disease Risk Table                     Men   Women  1/2 Average Risk   3.4   3.3  Average Risk       5.0   4.4  2 X Average Risk   9.6   7.1  3 X Average Risk  23.4   11.0        Use the calculated Patient Ratio above and the CHD Risk Table to determine the patient's CHD Risk.        ATP III CLASSIFICATION (LDL):  <100     mg/dL   Optimal  100-129  mg/dL   Near or Above                    Optimal  130-159  mg/dL   Borderline  160-189  mg/dL   High  >190     mg/dL   Very High   APTT     Status: None   Collection Time: 08/29/15  4:14 AM  Result Value Ref Range   aPTT 35 24 - 37 seconds  Comprehensive metabolic panel     Status: Abnormal   Collection Time: 08/29/15  4:14 AM  Result Value Ref Range   Sodium 140 135 - 145 mmol/L   Potassium 4.0 3.5 - 5.1 mmol/L   Chloride 103 101 - 111 mmol/L   CO2 26 22 - 32 mmol/L   Glucose, Bld 133 (H) 65 - 99 mg/dL   BUN 24 (H) 6 - 20 mg/dL   Creatinine, Ser 1.50 (H) 0.61 - 1.24 mg/dL   Calcium 8.3 (L) 8.9 - 10.3 mg/dL   Total Protein 7.1 6.5 - 8.1 g/dL   Albumin 3.4 (L) 3.5 - 5.0 g/dL   AST 32 15 - 41 U/L   ALT 22 17 - 63 U/L   Alkaline Phosphatase 75 38 - 126 U/L   Total Bilirubin 1.4 (H) 0.3 - 1.2 mg/dL   GFR calc non Af Amer 43 (L) >60 mL/min   GFR calc Af Amer 50 (L) >60 mL/min    Comment: (NOTE) The  eGFR has been calculated using the CKD EPI equation. This calculation has not been validated in all clinical  situations. eGFR's persistently <60 mL/min signify possible Chronic Kidney Disease.    Anion gap 11 5 - 15  CBC     Status: Abnormal   Collection Time: 08/29/15  4:14 AM  Result Value Ref Range   WBC 11.1 (H) 4.0 - 10.5 K/uL   RBC 4.64 4.22 - 5.81 MIL/uL   Hemoglobin 13.4 13.0 - 17.0 g/dL   HCT 41.9 39.0 - 52.0 %   MCV 90.3 78.0 - 100.0 fL   MCH 28.9 26.0 - 34.0 pg   MCHC 32.0 30.0 - 36.0 g/dL   RDW 14.1 11.5 - 15.5 %   Platelets 206 150 - 400 K/uL  Protime-INR     Status: Abnormal   Collection Time: 08/29/15  4:14 AM  Result Value Ref Range   Prothrombin Time 26.0 (H) 11.6 - 15.2 seconds   INR 2.42 (H) 0.00 - 1.49  Brain natriuretic peptide     Status: Abnormal   Collection Time: 08/29/15  4:27 AM  Result Value Ref Range   B Natriuretic Peptide 816.0 (H) 0.0 - 100.0 pg/mL    Ct Abdomen Pelvis W Contrast  08/29/2015  CLINICAL DATA:  Abdominal pain and vomiting since yesterday. New onset pancreatitis. Previous history of partial gastrectomy. EXAM: CT ABDOMEN AND PELVIS WITH CONTRAST TECHNIQUE: Multidetector CT imaging of the abdomen and pelvis was performed using the standard protocol following bolus administration of intravenous contrast. CONTRAST:  75 ml ISOVUE-300 IOPAMIDOL (ISOVUE-300) INJECTION 61% COMPARISON:  PET-CT 01/17/2005 FINDINGS: Dependent changes in the lung bases. Cardiac enlargement with dilated left atrium. Mitral valve replacement. Small esophageal hiatal hernia. 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 The liver, gallbladder, pancreas, adrenal glands, abdominal aorta, inferior vena cava, kidneys, and retroperitoneal lymph nodes are unremarkable. Postoperative changes in the stomach consistent with partial gastrectomy and jejunal anastomosis. Dilated fluid-filled proximal small bowel with decompressed bowel  distally. Transition zone appears to be in the left upper quadrant and may represent internal hernia or a adhesion. Small ventral abdominal hernia containing fat. Scattered stool in the colon without colonic distention. Small amount of free fluid in the pelvis along the liver edge and pericolic gutter. No free air. Pelvis: The appendix is normal. Bladder wall is not thickened. Prostate gland is enlarged, measuring 4.3 cm diameter. Small right-sided bladder diverticulum and mildly distended bladder may represent chronic bladder outflow obstruction. No free or loculated pelvic fluid collections. No pelvic mass or lymphadenopathy. No evidence of diverticulitis. No destructive bone lesions. Small bowel obstruction with transition zone in the left upper quadrant, likely representing internal hernia or adhesion. Small ventral anterior abdominal wall hernia containing fat. Small amount of free fluid in the abdomen may be reactive. Prostate enlargement with distended bladder and bladder diverticulum may indicate bladder outflow obstruction. Electronically Signed   By: Lucienne Capers M.D.   On: 08/29/2015 02:48    Review of Systems  Constitutional: Negative for fever, chills and weight loss.  HENT: Negative for nosebleeds.   Eyes: Negative for blurred vision.  Respiratory: Negative for shortness of breath.   Cardiovascular: Negative for chest pain, palpitations, orthopnea, leg swelling and PND.       +heart valve on coumadin  Gastrointestinal: Positive for nausea, vomiting and abdominal pain.  Genitourinary: Negative for dysuria and hematuria.  Musculoskeletal: Negative.   Skin: Negative for itching and rash.  Neurological: Negative for dizziness, focal weakness, seizures, loss of consciousness and headaches.       Denies TIAs,  amaurosis fugax  Endo/Heme/Allergies: Does not bruise/bleed easily.  Psychiatric/Behavioral: The patient is not nervous/anxious.    Blood pressure 228/105, pulse 88, temperature  97.7 F (36.5 C), temperature source Oral, resp. rate 18, height 5' 6"  (1.676 m), weight 55.4 kg (122 lb 2.2 oz), SpO2 96 %. Physical Exam  Vitals reviewed. Constitutional: He is oriented to person, place, and time. He appears well-developed and well-nourished. No distress.  Male sitting in bedside chair, appears comfortable, nontoxic  HENT:  Head: Normocephalic and atraumatic.  Right Ear: External ear normal.  Left Ear: External ear normal.  Eyes: Conjunctivae are normal. No scleral icterus.  Neck: Normal range of motion. Neck supple. No tracheal deviation present. No thyromegaly present.  Cardiovascular: Normal rate and normal heart sounds.   Respiratory: Effort normal and breath sounds normal. No stridor. No respiratory distress. He has no wheezes.  GI: Soft. There is no rebound and no guarding.  Soft, mild distension, very mild ttp; certainly no guarding/rebound/peritonitis. Small nontender incisional hernia. Upper midline incision  Musculoskeletal: He exhibits no edema or tenderness.  Lymphadenopathy:    He has no cervical adenopathy.  Neurological: He is alert and oriented to person, place, and time. He exhibits normal muscle tone.  Skin: Skin is warm and dry. No rash noted. He is not diaphoretic. No erythema. No pallor.  Psychiatric: He has a normal mood and affect. His behavior is normal. Judgment and thought content normal.    Assessment/Plan: pSBO Incisional hernia - not source of obstruction, contains fat only HTN urgency Mechanical valve on chronic anticoagulation Elevated lipase/amylase DM2 CKD BPH H/o heart failure  No signs of surgical abdomen. Essentially nontender, no wbc, no elevated lactate, no bowel wall thickening, no pneumatosis. Therefore believe can start with medical mgmt of sbo. Unclear if adhesive vs internal hernia. There is a transition point on CT. However, it is not classic for internal hernia therefore again believe we can do trial of medical  mgmt  Hold coumadin. Repeat INR Monday. Doesn't necessarily need correction yet - unless we need to operate Will need hep gtt once INR sub therapeutic for heart valve Npo - don't think he will be able to tolerate oral meds - pt actively burping IVF Place NG tube - i believe he is safe for NG even though he has had partial to subtotal gastrectomy, anastomosis appears widely patent on CT and SB is dilated in that location.  Will institute SBO protocol once ng placed Unclear etiology of elevated lipase/amylase - no radiological evidence of pancreatitis - rec repeat labs Monday. No abx   Discussed case with pt's brother on the phone who is a Energy manager in [place] who advocated for 24hrs of nonop mgmt  Robert Ruff. Redmond Pulling, MD, FACS General, Bariatric, & Minimally Invasive Surgery [facility], [place]    [facility] M 08/29/2015, 6:01 AM

## 2015-08-29 NOTE — Progress Notes (Signed)
[place] for heparin Indication: MVR  Allergies  Allergen Reactions  . Penicillins Other (See Comments)  . Vancomycin Other (See Comments)    Kidney problems    Patient Measurements: Height: 5\' 6"  (167.6 cm) Weight: 122 lb 2.2 oz (55.4 kg) IBW/kg (Calculated) : 63.8  Vital Signs: Temp: 97.5 F (36.4 C) (06/18 1300) Temp Source: Oral (06/18 1300) BP: 182/84 mmHg (06/18 1454) Pulse Rate: 64 (06/18 1454)  Labs:  Recent Labs  08/28/15 2111 08/29/15 0414 08/29/15 0914 08/29/15 1504  HGB 13.6 13.4  --   --   HCT 41.8 41.9  --   --   PLT 226 206  --   --   APTT  --  35  --   --   LABPROT 27.6* 26.0*  --   --   INR 2.62* 2.42*  --   --   HEPARINUNFRC  --   --   --  0.52  CREATININE 1.61* 1.50*  --   --   TROPONINI 0.04* 0.09* 0.23* 0.31*    Estimated Creatinine Clearance: 32.8 mL/min (by C-G formula based on Cr of 1.5).   Medical History: Past Medical History  Diagnosis Date  . Hypertension   . High cholesterol   . Chronic anticoagulation     coumadin for mechanical valves  . BPH (benign prostatic hyperplasia)   . CKD (chronic kidney disease), stage III   . Gastric cancer ([place]) 2005    s/p gastrectomy  . Rheumatic heart disease   . HOH (hard of hearing)   . Diabetes mellitus type 2, diet-controlled ([place])   . S/P MVR (mitral valve replacement) 2015    17mm StJude mechanical valve    Assessment: 75yo male c/o abdominal pain w/ N/V, found w/ SBO on CT, to hold Coumadin for possible surgical intervention, requiring heparin bridge for MVR; last pm INR within goal of 2.5-3.5, last dose of Coumadin taken 6/16.  Initial HL is therapeutic at 0.52 on heparin 900 units/hr. No issues with infusion or bleeding noted.  Goal of Therapy:  Heparin level 0.3-0.7 units/ml Monitor platelets by anticoagulation protocol: Yes   Plan:  Continue heparin 900 units/hr Daily HL/CBC Monitor s/sx of bleeding  Andrey Cota. Diona Foley, PharmD,  [place] Clinical Pharmacist Pager [phone]  08/29/2015,4:12 PM

## 2015-08-29 NOTE — H&P (Addendum)
History and Physical    Robert [name] [id number] DOB: May 01, 1940 DOA: 08/28/2015  Referring MD/NP/PA:   PCP: Burman Freestone, MD   Patient coming from:  The patient is coming from home.  At baseline, pt is independent for most of ADL.      Chief Complaint: Vomiting and abdominal pain  HPI: Robert [name] is a 75 y.o. male with medical history significant of gastric cancer (s/p of gastrectomy 2005, did not require chemotherapy or radiation therapy), hyperlipidemia, mitral valve replacement with mechanical valve on Coumadin, CKD-III, systolic congestive heart failure, who presents with vomiting and abdominal pain.  Patient states that he started having diffused abdominal pain in noon. It is associated with vomiting, but no nausea or diarrhea. Abdominal pain is a constant, 6 out of 10 in severity, radiating to bilateral back. It is not aggravated or alleviated by any known factors. He vomited several times without blood in the vomitus. Patient does not have fever or chills. Patient does not have chest pain, cough, shortness of breath, symptoms of UTI or unilateral weakness. Patient's initial blood pressure was elevated at 227/102, which improved to 195/97.   ED Course: pt was found to have WBC 11.0, INR 2.62, troponin 0.04, lipase 950, amylase 79, LDH 373, negative urinalysis, temperature normal, no tachycardia, no tachypnea, oxygen saturation normal, stable renal function, total bilirubin 1.3, normal transaminase. Pt is admitted to telemetry bed for further eval and treatment. General surgery was consulted baby.  # CT abdomen/pelvis showed: 1. Small bowel obstruction with transition zone in the left upper quadrant, likely representing internal hernia or adhesion; 2. Small ventral abdominal hernia containing fat; 3. Prostate gland is enlarged, measuring 4.3 cm diameter. Small right-sided bladder diverticulum and mildly distended bladder may represent chronic bladder outflow obstruction; 4.  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  Review of Systems:   General: no fevers, chills, no changes in body weight, has poor appetite, has fatigue HEENT: no blurry vision, hearing changes or sore throat Pulm: no dyspnea, coughing, wheezing CV: no chest pain, no palpitations Abd: no nausea, has vomiting, abdominal pain, no diarrhea, constipation GU: no dysuria, burning on urination, increased urinary frequency, hematuria  Ext: no leg edema Neuro: no unilateral weakness, numbness, or tingling, no vision change or hearing loss Skin: no rash MSK: No muscle spasm, no deformity, no limitation of range of movement in spin Heme: No easy bruising.  Travel history: No recent long distant travel.  Allergy:  Allergies  Allergen Reactions  . Penicillins Other (See Comments)  . Vancomycin Other (See Comments)    Kidney problems    Past Medical History  Diagnosis Date  . Hypertension   . High cholesterol   . Chronic anticoagulation     coumadin for mechanical valves  . BPH (benign prostatic hyperplasia)   . CKD (chronic kidney disease), stage III   . Gastric cancer ([employer]) 2005    s/p gastrectomy  . Rheumatic heart disease   . HOH (hard of hearing)   . Diabetes mellitus type 2, diet-controlled ([place])   . S/P MVR (mitral valve replacement) 2015    29mm StJude mechanical valve    Past Surgical History  Procedure Laterality Date  . Mitral valve replacement  10/24/2013    44mm StJude mechanical valve  . Partial gastrectomy  2005  . Hernia repair    . Cardiac catheterization N/A 05/10/2015    Procedure: Right/Left Heart Cath and Coronary Angiography;  Surgeon: Burnell Blanks, MD;  Location: [place] [facility];  Service: Cardiovascular;  Laterality: N/A;    Social History:  reports that he has never smoked. He has never used smokeless tobacco. He reports that he does not drink alcohol or use illicit drugs.  Family History:   Family History  Problem Relation Age of Onset  . Hypertension Mother   . Diabetes Mother   . Cancer Brother      Prior to Admission medications   Medication Sig Start Date End Date Taking? Authorizing Provider  aspirin 81 MG chewable tablet Chew 81 mg by mouth daily.   Yes Historical Provider, MD  atorvastatin (LIPITOR) 20 MG tablet Take 20 mg by mouth at bedtime.   Yes Historical Provider, MD  carvedilol (COREG) 6.25 MG tablet Take 1 tablet (6.25 mg total) by mouth 2 (two) times daily with a meal. 06/10/15  Yes Pixie Casino, MD  ferrous sulfate 325 (65 FE) MG tablet Take 1 tablet (325 mg total) by mouth 2 (two) times daily with a meal. 05/13/15  Yes Belkys A Regalado, MD  furosemide (LASIX) 40 MG tablet Take 1 tablet (40 mg total) by mouth daily. 08/19/15  Yes Pixie Casino, MD  isosorbide mononitrate (IMDUR) 30 MG 24 hr tablet Take 1 tablet (30 mg total) by mouth daily. 06/10/15  Yes Pixie Casino, MD  Multiple Vitamin (MULTIVITAMIN WITH MINERALS) TABS tablet Take 1 tablet by mouth daily.   Yes Historical Provider, MD  tamsulosin (FLOMAX) 0.4 MG CAPS capsule Take 1 capsule (0.4 mg total) by mouth daily after supper. 06/10/15  Yes Pixie Casino, MD  vitamin B-12 (CYANOCOBALAMIN) 1000 MCG tablet Take 1 tablet (1,000 mcg total) by mouth daily. 05/13/15  Yes Belkys A Regalado, MD  warfarin (COUMADIN) 3 MG tablet Take 1-1.5 tablets by mouth daily as directed by coumadin clinic Patient taking differently: Take 3-4.5 mg by mouth daily at 6 PM. Take 1 and 1/2 tablets on Monday and Friday then take 1 tablet all there other days 08/13/15  Yes Pixie Casino, MD    Physical Exam: Filed Vitals:   08/29/15 0130 08/29/15 0200 08/29/15 0400 08/29/15 0425  BP: 187/85 195/97  211/103  Pulse: 79 81  91  Temp:      TempSrc:      Resp: 15 17  15   Height:   5\' 6"  (1.676 m)   Weight:   56.3 kg (124 lb 1.9 oz)   SpO2: 95% 97%  97%   General: Not in acute distress. Cachexia. HEENT:       Eyes: PERRL,  EOMI, no scleral icterus.       ENT: No discharge from the ears and nose, no pharynx injection, no tonsillar enlargement.        Neck: No JVD, no bruit, no mass felt. Heme: No neck lymph node enlargement. Cardiac: S1/S2, RRR, has mechanical valve click, No gallops or rubs. Pulm: No rales, wheezing, rhonchi or rubs. Abd: distended, diffused tenderness, no rebound pain, no organomegaly, BS present. GU: No hematuria Ext: No pitting leg edema bilaterally. 2+DP/PT pulse bilaterally. Musculoskeletal: No joint deformities, No joint redness or warmth, no limitation of ROM in spin. Skin: No rashes.  Neuro: Alert, oriented X3, cranial nerves II-XII grossly intact, moves all extremities normally. Psych: Patient is not psychotic, no suicidal or hemocidal ideation.  Labs on Admission: I have personally reviewed following labs and imaging studies  CBC:  Recent Labs Lab 08/28/15 2111 08/29/15 0414  WBC 11.0* 11.1*  HGB 13.6 13.4  HCT 41.8 41.9  MCV 90.1 90.3  PLT 226 [id number]   Basic Metabolic Panel:  Recent Labs Lab 08/28/15 2111  NA 139  K 4.1  CL 101  CO2 28  GLUCOSE 141*  BUN 25*  CREATININE 1.61*  CALCIUM 9.0   GFR: Estimated Creatinine Clearance: 31.1 mL/min (by C-G formula based on Cr of 1.61). Liver Function Tests:  Recent Labs Lab 08/28/15 2111  AST 32  ALT 29  ALKPHOS 82  BILITOT 1.3*  PROT 8.1  ALBUMIN 4.0    Recent Labs Lab 08/28/15 2111 08/29/15 0053  LIPASE 950*  --   AMYLASE  --  979*   No results for input(s): AMMONIA in the last 168 hours. Coagulation Profile:  Recent Labs Lab 08/28/15 2111  INR 2.62*   Cardiac Enzymes:  Recent Labs Lab 08/28/15 2111  TROPONINI 0.04*   BNP (last 3 results) No results for input(s): PROBNP in the last 8760 hours. HbA1C: No results for input(s): HGBA1C in the last 72 hours. CBG: No results for input(s): GLUCAP in the last 168 hours. Lipid Profile: No results for input(s): CHOL, HDL, LDLCALC, TRIG,  CHOLHDL, LDLDIRECT in the last 72 hours. Thyroid Function Tests: No results for input(s): TSH, T4TOTAL, FREET4, T3FREE, THYROIDAB in the last 72 hours. Anemia Panel: No results for input(s): VITAMINB12, FOLATE, FERRITIN, TIBC, IRON, RETICCTPCT in the last 72 hours. Urine analysis:    Component Value Date/Time   COLORURINE YELLOW 08/28/2015 2117   APPEARANCEUR CLEAR 08/28/2015 2117   LABSPEC 1.015 08/28/2015 2117   PHURINE 7.0 08/28/2015 2117   GLUCOSEU NEGATIVE 08/28/2015 2117   HGBUR MODERATE* 08/28/2015 2117   BILIRUBINUR NEGATIVE 08/28/2015 2117   Scalp Level NEGATIVE 08/28/2015 2117   PROTEINUR 100* 08/28/2015 2117   NITRITE NEGATIVE 08/28/2015 2117   LEUKOCYTESUR NEGATIVE 08/28/2015 2117   Sepsis Labs: @LABRCNTIP (procalcitonin:4,lacticidven:4) )No results found for this or any previous visit (from the past 240 hour(s)).   Radiological Exams on Admission: Ct Abdomen Pelvis W Contrast  08/29/2015  CLINICAL DATA:  Abdominal pain and vomiting since yesterday. New onset pancreatitis. Previous history of partial gastrectomy. EXAM: CT ABDOMEN AND PELVIS WITH CONTRAST TECHNIQUE: Multidetector CT imaging of the abdomen and pelvis was performed using the standard protocol following bolus administration of intravenous contrast. CONTRAST:  75 ml ISOVUE-300 IOPAMIDOL (ISOVUE-300) INJECTION 61% COMPARISON:  PET-CT 01/17/2005 FINDINGS: Dependent changes in the lung bases. Cardiac enlargement with dilated left atrium. Mitral valve replacement. Small esophageal hiatal hernia. 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 The liver, gallbladder, pancreas, adrenal glands, abdominal aorta, inferior vena cava, kidneys, and retroperitoneal lymph nodes are unremarkable. Postoperative changes in the stomach consistent with partial gastrectomy and jejunal anastomosis. Dilated fluid-filled proximal small bowel with decompressed bowel distally. Transition zone  appears to be in the left upper quadrant and may represent internal hernia or a adhesion. Small ventral abdominal hernia containing fat. Scattered stool in the colon without colonic distention. Small amount of free fluid in the pelvis along the liver edge and pericolic gutter. No free air. Pelvis: The appendix is normal. Bladder wall is not thickened. Prostate gland is enlarged, measuring 4.3 cm diameter. Small right-sided bladder diverticulum and mildly distended bladder may represent chronic bladder outflow obstruction. No free or loculated pelvic fluid collections. No pelvic mass or lymphadenopathy. No evidence of diverticulitis. No destructive bone lesions. Small bowel obstruction with transition zone in the left upper quadrant, likely representing  internal hernia or adhesion. Small ventral anterior abdominal wall hernia containing fat. Small amount of free fluid in the abdomen may be reactive. Prostate enlargement with distended bladder and bladder diverticulum may indicate bladder outflow obstruction. Electronically Signed   By: Lucienne Capers M.D.   On: 08/29/2015 02:48     EKG: Independently reviewed. QTC 468, sinus rhythm, nonspecific T-wave change.  Assessment/Plan Principal Problem:   SBO (small bowel obstruction) (HCC) Active Problems:   Chronic systolic (congestive) heart failure (HCC)   Hypertensive urgency   CKD (chronic kidney disease) stage 3, GFR 30-59 ml/min   H/O mitral valve replacement with mechanical valve   Long term (current) use of anticoagulants [Z79.01]   BPH (benign prostatic hyperplasia)   Elevated troponin   Pancreatitis   Protein-calorie malnutrition, severe (HCC)   HLD (hyperlipidemia)   SBO (small bowel obstruction) ([place]): CT-scan showed small bowel obstruction with transition zone in the left upper quadrant, likely representing internal hernia or adhesion. Pt is hemodynamically stable. Clinically not septic, pending lactate level. Currently patient is not  actively vomiting, abdominal pain has improved.  -will admit to tele bed for obs -NPO -Gen. surgery was consulted, with follow-up recommendations -When necessary Zofran for nausea, morphine for pain. - INR/PTT/type & screen -IVF: 1L NS was given in ED. Will hold IVF due to EF 45%. Pending Lactate, if lactate is elevated--> will started fluid resuscitation.  Elevated lipase and amylase: Questionable pancreatitis, CT scan showed pancreas is unremarkable -Nothing by mouth -Pain control as above  Chronic systolic (congestive) heart failure ([place]): Cardiac on 05/07/15 showed EF 45-50%. on Lasix 40 mg daily at home. Patient does not have leg edema or JVD. CHF is compensated. -Hold Lasix -Switch oral Coreg to IV metoprolol with holding parameter -Check BNP  Hypertensive urgency: Blood pressure is elevated at 227/102--> 195/97. No chest pain. Most likely due to pain. -Hold oral Coreg, Lasix -IV hydralazine when necessary -IV metoprolol 5 mg 3 times a day  Elevated troponin: Troponin 0.04 on admission. Most likely due to demanding ischemia secondary to hypertensive urgency. -On IV heparin as above -trop x 3 -2d echo -check A1c and FLP -IV metoprolol  CKD-III: Stable. Baseline creatinine 1.8. His creatinine is 1.61, BUN 25 on admission. -Follow-up renal function by CMP  H/O mitral valve replacement with mechanical valve: on Coumdin, INR=2.62 therapeutic. -We'll switch Coumadin to IV heparin in case patient needs surgery  BPH (benign prostatic hyperplasia): on Flomax at home -Hold oral Flomax due to vomiting and abdominal pain  HLD: no LD on record. -hold lipitor -check FLP  Protein-calorie malnutrition, severe ([place]) -Consult to nutrition   DVT ppx: on IV Heparin Code Status: Full code Family Communication: Yes, patient's wife at bed side Disposition Plan:  Anticipate discharge back to previous home environment Consults called: Education officer, environmental, Dr. Redmond Pulling. Admission status:  Inpatient/tele   Date of Service 08/29/2015    Ivor Costa [facility] Pager [phone]  If 7PM-7AM, please contact night-coverage [email or web address] Password [email or web address] 08/29/2015, 4:48 AM

## 2015-08-29 NOTE — Progress Notes (Addendum)
ANTICOAGULATION CONSULT NOTE - Initial Consult  Pharmacy Consult for heparin Indication: MVR  Allergies  Allergen Reactions  . Penicillins Other (See Comments)  . Vancomycin Other (See Comments)    Kidney problems    Patient Measurements: Height: 5\' 6"  (167.6 cm) Weight: 124 lb 1.9 oz (56.3 kg) IBW/kg (Calculated) : 63.8  Vital Signs: Temp: 97.8 F (36.6 C) (06/17 2101) Temp Source: Oral (06/17 2101) BP: 195/97 mmHg (06/18 0200) Pulse Rate: 81 (06/18 0200)  Labs:  Recent Labs  08/28/15 2111  HGB 13.6  HCT 41.8  PLT 226  LABPROT 27.6*  INR 2.62*  CREATININE 1.61*  TROPONINI 0.04*    Estimated Creatinine Clearance: 31.1 mL/min (by C-G formula based on Cr of 1.61).   Medical History: Past Medical History  Diagnosis Date  . Hypertension   . High cholesterol   . Chronic anticoagulation     coumadin for mechanical valves  . BPH (benign prostatic hyperplasia)   . CKD (chronic kidney disease), stage III   . Gastric cancer ([place]) 2005    s/p gastrectomy  . Rheumatic heart disease   . HOH (hard of hearing)   . Diabetes mellitus type 2, diet-controlled ([place])   . S/P MVR (mitral valve replacement) 2015    70mm StJude mechanical valve    Assessment: 75yo male c/o abdominal pain w/ N/V, found w/ SBO on CT, to hold Coumadin for possible surgical intervention, requiring heparin bridge for MVR; last pm INR within goal of 2.5-3.5, last dose of Coumadin taken 6/16.  Goal of Therapy:  Heparin level 0.3-0.7 units/ml Monitor platelets by anticoagulation protocol: Yes   Plan:  Will begin heparin when INR <2.5.  Wynona Neat, PharmD, BCPS  08/29/2015,4:11 AM   ADDENDUM: INR now below goal at 2.42; will begin heparin gtt at 900 units/hr and monitor heparin levels and CBC. VB    5:23 AM

## 2015-08-29 NOTE — Progress Notes (Signed)
PROGRESS NOTE    Robert [name]  [id number]  DOB: Mar 20, 1940  DOA: 08/28/2015 PCP: Burman Freestone, MD Outpatient Specialists:   Hospital course: BENTURA MCMICKEN is a 75 y.o. male with medical history significant of gastric cancer (s/p of gastrectomy 2005, did not require chemotherapy or radiation therapy), hyperlipidemia, mitral valve replacement with mechanical valve on Coumadin, CKD-III, systolic congestive heart failure, who presents with vomiting and abdominal pain.  Patient states that he started having diffused abdominal pain in noon. It is associated with vomiting, but no nausea or diarrhea. Abdominal pain is a constant, 6 out of 10 in severity, radiating to bilateral back. It is not aggravated or alleviated by any known factors. He vomited several times without blood in the vomitus. Patient does not have fever or chills. Patient does not have chest pain, cough, shortness of breath, symptoms of UTI or unilateral weakness. Patient's initial blood pressure was elevated at 227/102, which improved to 195/97.   ED Course: pt was found to have WBC 11.0, INR 2.62, troponin 0.04, lipase 950, amylase 79, LDH 373, negative urinalysis, temperature normal, no tachycardia, no tachypnea, oxygen saturation normal, stable renal function, total bilirubin 1.3, normal transaminase. Pt is admitted to telemetry bed for further eval and treatment. General surgery was consulted baby.  CT abdomen/pelvis showed: 1. Small bowel obstruction with transition zone in the left upper quadrant, likely representing internal hernia or adhesion; 2. Small ventral abdominal hernia containing fat; 3. Prostate gland is enlarged, measuring 4.3 cm diameter. Small right-sided bladder diverticulum and mildly distended bladder may represent chronic bladder outflow obstruction; 4. 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  Assessment & Plan:   Partial SBO:  -Continue  NG suction and Nothing by mouth and follow surgical recommendations -Pt prefers conservative approach with surgery as very last resort, also would want to return to [facility] if he had to have surgery -Pain and nausea management with IV meds as ordered.  Chronic systolic (congestive) heart failure ([place]): Cardiac on 05/07/15 showed EF 45-50%. on Lasix 40 mg daily at home. Patient does not have leg edema or JVD. CHF is compensated. -Hold Lasix for now -Switched oral home Coreg to IV metoprolol with holding parameters  Hypertensive urgency: Blood pressure is elevated at 227/102--> 195/97. No chest pain. -suboptimally controlled, increase hydralazine dose and lopressor doses, control pain.  -Hold oral Coreg, Lasix for now -IV hydralazine when necessary -IV metoprolol 3 times a day -if can't get under control, will need to start IV drip, no step down or ICU beds at this time.  Elevated troponin: Troponin 0.04 on admission. Most likely due to demanding ischemia secondary to hypertensive urgency. -Pt denies having chest pain -trop x 3 ordered and following -2d echo pending -check A1c and FLP -IV metoprolol as above  CKD-III: Stable. Baseline creatinine 1.8. His creatinine is 1.61, BUN 25 on admission. -Follow-up renal function by CMP  H/O mitral valve replacement with mechanical valve: on Coumdin, INR=2.62 therapeutic. -Had planned to switch to IV heparin when PT subtherapeutic in case patient needed surgery -Check PT/INR in AM.  -Pharmacist consulted to assist with heparin   BPH (benign prostatic hyperplasia): on Flomax at home -Hold oral Flomax due to vomiting and abdominal pain  HLD: no LD on record. -hold lipitor -check FLP  Protein-calorie malnutrition, severe ([place]) -Consult to nutrition   DVT ppx: anticoagulated with warfarin Code Status: Full code Family Communication: Yes, patient's wife at bed side Disposition Plan:  Anticipate discharge back to previous home  environment Consults called: General surgeon, Dr. Redmond Pulling. Admission status: Inpatient/tele   Consultants:  General surgery  pharmacist  Procedures:  NG placement  Antimicrobials:  N/A   Subjective: Pt says that he still has pain but controlled with medications, overall says he is feeling better with less abdominal distention. No emesis.   Objective: Filed Vitals:   08/29/15 0200 08/29/15 0400 08/29/15 0425 08/29/15 0526  BP: 195/97  211/103 228/105  Pulse: 81  91 88  Temp:    97.7 F (36.5 C)  TempSrc:    Oral  Resp: 17  15 18   Height:  5\' 6"  (1.676 m)    Weight:  124 lb 1.9 oz (56.3 kg)  122 lb 2.2 oz (55.4 kg)  SpO2: 97%  97% 96%   No intake or output data in the 24 hours ending 08/29/15 0813 Filed Weights   08/29/15 0400 08/29/15 0526  Weight: 124 lb 1.9 oz (56.3 kg) 122 lb 2.2 oz (55.4 kg)   Exam:  General exam: awake, alert, no apparent distress.  Respiratory system: Clear. No increased work of breathing. Cardiovascular system: S1 & S2 heard, RRR. No JVD, murmurs, gallops, clicks or pedal edema. Gastrointestinal system: Abdomen is nondistended, soft and with mild epigastric tenderness, no guarding. bowel sounds heard. Central nervous system: Alert and oriented. No focal neurological deficits. Extremities: no clubbing or cyanosis.   Data Reviewed: Basic Metabolic Panel:  Recent Labs Lab 08/28/15 2111 08/29/15 0414  NA 139 140  K 4.1 4.0  CL 101 103  CO2 28 26  GLUCOSE 141* 133*  BUN 25* 24*  CREATININE 1.61* 1.50*  CALCIUM 9.0 8.3*   Liver Function Tests:  Recent Labs Lab 08/28/15 2111 08/29/15 0414  AST 32 32  ALT 29 22  ALKPHOS 82 75  BILITOT 1.3* 1.4*  PROT 8.1 7.1  ALBUMIN 4.0 3.4*    Recent Labs Lab 08/28/15 2111 08/29/15 0053  LIPASE 950*  --   AMYLASE  --  979*   No results for input(s): AMMONIA in the last 168 hours. CBC:  Recent Labs Lab 08/28/15 2111 08/29/15 0414  WBC 11.0* 11.1*  HGB 13.6 13.4  HCT 41.8  41.9  MCV 90.1 90.3  PLT 226 206   Cardiac Enzymes:  Recent Labs Lab 08/28/15 2111 08/29/15 0414  TROPONINI 0.04* 0.09*   BNP (last 3 results) No results for input(s): PROBNP in the last 8760 hours. CBG: No results for input(s): GLUCAP in the last 168 hours.  No results found for this or any previous visit (from the past 240 hour(s)).   Studies: Ct Abdomen Pelvis W Contrast  08/29/2015  CLINICAL DATA:  Abdominal pain and vomiting since yesterday. New onset pancreatitis. Previous history of partial gastrectomy. EXAM: CT ABDOMEN AND PELVIS WITH CONTRAST TECHNIQUE: Multidetector CT imaging of the abdomen and pelvis was performed using the standard protocol following bolus administration of intravenous contrast. CONTRAST:  75 ml ISOVUE-300 IOPAMIDOL (ISOVUE-300) INJECTION 61% COMPARISON:  PET-CT 01/17/2005 FINDINGS: Dependent changes in the lung bases. Cardiac enlargement with dilated left atrium. Mitral valve replacement. Small esophageal hiatal hernia. 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 The liver, gallbladder, pancreas, adrenal glands, abdominal aorta, inferior vena cava, kidneys, and retroperitoneal lymph nodes are unremarkable. Postoperative changes in the stomach consistent with partial gastrectomy and jejunal anastomosis. Dilated fluid-filled proximal small bowel with decompressed bowel distally. Transition zone appears to be in the left upper quadrant  and may represent internal hernia or a adhesion. Small ventral abdominal hernia containing fat. Scattered stool in the colon without colonic distention. Small amount of free fluid in the pelvis along the liver edge and pericolic gutter. No free air. Pelvis: The appendix is normal. Bladder wall is not thickened. Prostate gland is enlarged, measuring 4.3 cm diameter. Small right-sided bladder diverticulum and mildly distended bladder may represent chronic bladder outflow obstruction. No  free or loculated pelvic fluid collections. No pelvic mass or lymphadenopathy. No evidence of diverticulitis. No destructive bone lesions. Small bowel obstruction with transition zone in the left upper quadrant, likely representing internal hernia or adhesion. Small ventral anterior abdominal wall hernia containing fat. Small amount of free fluid in the abdomen may be reactive. Prostate enlargement with distended bladder and bladder diverticulum may indicate bladder outflow obstruction. Electronically Signed   By: Lucienne Capers M.D.   On: 08/29/2015 02:48   Dg Abd Portable 1v-small Bowel Protocol-position Verification  08/29/2015  CLINICAL DATA:  Encounter for nasogastric tube placement. EXAM: PORTABLE ABDOMEN - 1 VIEW COMPARISON:  None. FINDINGS: The bowel gas pattern is normal. No radio-opaque calculi or other significant radiographic abnormality are seen. Distal tip of nasogastric tube is seen in proximal stomach. Residual contrast is noted in urinary bladder. IMPRESSION: Distal tip of nasogastric tube seen in proximal stomach. No evidence of bowel obstruction or ileus. Electronically Signed   By: Marijo Conception, M.D.   On: 08/29/2015 07:36    Scheduled Meds: . diatrizoate meglumine-sodium  90 mL Per NG tube Once  . metoprolol  10 mg Intravenous Q8H  . metoprolol  5 mg Intravenous NOW  . sodium chloride flush  3 mL Intravenous Q12H   Continuous Infusions: . heparin     Principal Problem:   SBO (small bowel obstruction) (H[facility]) Active Problems:   Chronic systolic (congestive) heart failure ([facility])   Hypertensive urgency   CKD (chronic kidney disease) stage 3, GFR 30-59 ml/min   H/O mitral valve replacement with mechanical valve   Long term (current) use of anticoagulants [Z79.01]   BPH (benign prostatic hyperplasia)   Elevated troponin   Pancreatitis   Protein-calorie malnutrition, severe ([facility])   HLD (hyperlipidemia)  Time spent:   Irwin Brakeman, MD, FAAFP [facility] Pager [phone]  If 7PM-7AM, please contact night-coverage [email or web address] Password [email or web address] 08/29/2015, 8:13 AM    LOS: 0 days

## 2015-08-29 NOTE — Progress Notes (Addendum)
Xray report back.  Paged on-call [facility] surgeon.  Orders received for clear liquid diet & remove NGT.  Jillyn Ledger, MBA, BSN, RN

## 2015-08-30 ENCOUNTER — Inpatient Hospital Stay (HOSPITAL_COMMUNITY): Payer: BC Managed Care – PPO

## 2015-08-30 ENCOUNTER — Other Ambulatory Visit (HOSPITAL_COMMUNITY): Payer: BC Managed Care – PPO

## 2015-08-30 DIAGNOSIS — K5669 Other intestinal obstruction: Secondary | ICD-10-CM

## 2015-08-30 DIAGNOSIS — I5022 Chronic systolic (congestive) heart failure: Secondary | ICD-10-CM

## 2015-08-30 DIAGNOSIS — I160 Hypertensive urgency: Secondary | ICD-10-CM

## 2015-08-30 DIAGNOSIS — R7989 Other specified abnormal findings of blood chemistry: Secondary | ICD-10-CM

## 2015-08-30 DIAGNOSIS — Z7901 Long term (current) use of anticoagulants: Secondary | ICD-10-CM

## 2015-08-30 LAB — HEPARIN LEVEL (UNFRACTIONATED)
HEPARIN UNFRACTIONATED: 0.76 [IU]/mL — AB (ref 0.30–0.70)
Heparin Unfractionated: 0.69 IU/mL (ref 0.30–0.70)
Heparin Unfractionated: 0.56 IU/mL (ref 0.30–0.70)

## 2015-08-30 LAB — HEMOGLOBIN A1C
Hgb A1c MFr Bld: 6.3 % — ABNORMAL HIGH (ref 4.8–5.6)
Mean Plasma Glucose: 134 mg/dL

## 2015-08-30 LAB — COMPREHENSIVE METABOLIC PANEL
ALK PHOS: 67 U/L (ref 38–126)
ALT: 22 U/L (ref 17–63)
ANION GAP: 9 (ref 5–15)
AST: 35 U/L (ref 15–41)
Albumin: 3.4 g/dL — ABNORMAL LOW (ref 3.5–5.0)
BILIRUBIN TOTAL: 1.4 mg/dL — AB (ref 0.3–1.2)
BUN: 34 mg/dL — ABNORMAL HIGH (ref 6–20)
CALCIUM: 8.4 mg/dL — AB (ref 8.9–10.3)
CO2: 28 mmol/L (ref 22–32)
CREATININE: 1.8 mg/dL — AB (ref 0.61–1.24)
Chloride: 103 mmol/L (ref 101–111)
GFR calc non Af Amer: 35 mL/min — ABNORMAL LOW (ref >60)
GFR, EST AFRICAN AMERICAN: 40 mL/min — AB (ref >60)
Glucose, Bld: 147 mg/dL — ABNORMAL HIGH (ref 65–99)
Potassium: 4 mmol/L (ref 3.5–5.1)
Sodium: 140 mmol/L (ref 135–145)
TOTAL PROTEIN: 7.4 g/dL (ref 6.5–8.1)

## 2015-08-30 LAB — CBC
HCT: 41.4 % (ref 39.0–52.0)
HEMOGLOBIN: 13.3 g/dL (ref 13.0–17.0)
MCH: 29.2 pg (ref 26.0–34.0)
MCHC: 32.1 g/dL (ref 30.0–36.0)
MCV: 90.8 fL (ref 78.0–100.0)
PLATELETS: 203 10*3/uL (ref 150–400)
RBC: 4.56 MIL/uL (ref 4.22–5.81)
RDW: 14.3 % (ref 11.5–15.5)
WBC: 8.9 10*3/uL (ref 4.0–10.5)

## 2015-08-30 LAB — LIPASE, BLOOD: Lipase: 22 U/L (ref 11–51)

## 2015-08-30 LAB — GLUCOSE, CAPILLARY: Glucose-Capillary: 112 mg/dL — ABNORMAL HIGH (ref 65–99)

## 2015-08-30 LAB — PROTIME-INR
INR: 2.27 — AB (ref 0.00–1.49)
Prothrombin Time: 24.9 seconds — ABNORMAL HIGH (ref 11.6–15.2)

## 2015-08-30 LAB — AMYLASE: AMYLASE: 133 U/L — AB (ref 28–100)

## 2015-08-30 MED ORDER — HYDROMORPHONE HCL 1 MG/ML IJ SOLN
1.0000 mg | INTRAMUSCULAR | Status: DC | PRN
  Administered 2015-08-30 (×2): 1 mg via INTRAVENOUS
  Filled 2015-08-30 (×2): qty 1

## 2015-08-30 MED ORDER — BOOST / RESOURCE BREEZE PO LIQD
1.0000 | Freq: Three times a day (TID) | ORAL | Status: DC
  Administered 2015-08-30 – 2015-09-03 (×3): 1 via ORAL

## 2015-08-30 MED ORDER — HYDRALAZINE HCL 20 MG/ML IJ SOLN
5.0000 mg | Freq: Four times a day (QID) | INTRAMUSCULAR | Status: DC
  Administered 2015-08-30 – 2015-08-31 (×5): 5 mg via INTRAVENOUS
  Filled 2015-08-30 (×5): qty 1

## 2015-08-30 MED ORDER — SIMETHICONE 80 MG PO CHEW
80.0000 mg | CHEWABLE_TABLET | Freq: Once | ORAL | Status: AC
  Administered 2015-08-30: 80 mg via ORAL
  Filled 2015-08-30: qty 1

## 2015-08-30 NOTE — Progress Notes (Addendum)
PROGRESS NOTE  NICKLES BLACKLEDGE  [id number]  DOB: [birth year]  DOA: 08/28/2015 PCP: Burman Freestone, MD  Hospital course: 75 y.o. male with medical history significant of gastric cancer (s/p of gastrectomy 2005, did not require chemotherapy or radiation therapy), hyperlipidemia, mitral valve replacement with mechanical valve on Coumadin, CKD-III, systolic congestive heart failure, who presented with vomiting and abdominal pain. Imaging studies in ED notable for SBO with transition zone in the LUQ, ? Internal hernia or adhesion, also focal defect, ? Splenic infarct.   Assessment & Plan:   Acute LUQ and LLQ pain, Partial SBO, ? Splenic infarct noted on CT abd, ? Acute pancreatitis  - repeat abd XRAY with unresolved SBO, NGT has however been removed - lipase and amylase level also elevated on admission, unclear etiology, A1C and lipid panel stable  - no indication for surgical intervention per surgery team - continue conservative management for now - provide analgesia and antiemetics as needed - d/w surgery team if the persistent LUQ pain related to persistent partial SBO  Chronic systolic (congestive) heart failure ([place]) - Cardiac on 05/07/15 showed EF 45-50%. on Lasix 40 mg daily at home.  - Lasix has been on hold for now  - home coreg also no hold and pt started on Metoprolol IV until PO intake improves   Hypertensive urgency - Blood pressure is elevated at 227/102--> 195/97. No chest pain. - currently on Metoprolol and Hydralazine - also added Hydralazine as needed  - cardiology consulted for assistance   Elevated troponin - Most likely due to demanding ischemia secondary to hypertensive urgency. - Pt denies having chest pain - 2D echo pending - cardiology consulted   CKD-III - Baseline creatinine 1.8 - overall stable - BMP in AM  H/O mitral valve replacement with mechanical valve - on Coumdin, INR=2.27 - per pharmacy   BPH (benign prostatic hyperplasia) - on  Flomax at home - Hold oral Flomax until pt able to take PO   HLD - holding Lipitor   Protein-calorie malnutrition, severe ([place]) - nutritionist consulted    DVT ppx: anticoagulated with warfarin Code Status: No intubation, OK to do CPR  Family Communication: Yes, patient's wife at bed side, brother over the phone  Disposition Plan: Anticipate discharge back to previous home environment  Consultants:  General surgery  pharmacist  Procedures:  NG placement, now removed   Antimicrobials:  N/A   Subjective: Pt says that he still has pain but controlled with medications..   Objective: Filed Vitals:   08/29/15 1845 08/29/15 2212 08/30/15 0025 08/30/15 0449  BP: 175/71 160/85 166/71 179/70  Pulse: 71 76 60 50  Temp: 97.2 F (36.2 C) 97.5 F (36.4 C) 98.1 F (36.7 C) 97.7 F (36.5 C)  TempSrc: Oral Oral Oral Oral  Resp:  16 18 18   Height:      Weight:  53.5 kg (117 lb 15.1 oz)    SpO2: 96% 97% 98% 99%    Intake/Output Summary (Last 24 hours) at 08/30/15 0710 Last data filed at 08/30/15 0626  Gross per 24 hour  Intake 238.06 ml  Output   1503 ml  Net -1264.94 ml   Filed Weights   08/29/15 0400 08/29/15 0526 08/29/15 2212  Weight: 56.3 kg (124 lb 1.9 oz) 55.4 kg (122 lb 2.2 oz) 53.5 kg (117 lb 15.1 oz)   Exam:  General exam: awake, alert, no apparent distress.  Respiratory system: Clear. No increased work of breathing. Cardiovascular system: S1 & S2 heard, RRR. No JVD,  gallops, clicks or pedal edema. Gastrointestinal system: Abdomen is nondistended, soft and with mild epigastric tenderness, no guarding. bowel sounds heard. Central nervous system: Alert and oriented. No focal neurological deficits. Extremities: no clubbing or cyanosis.   Data Reviewed: Basic Metabolic Panel:  Recent Labs Lab 08/28/15 2111 08/29/15 0414 08/30/15 0025  NA 139 140 140  K 4.1 4.0 4.0  CL 101 103 103  CO2 28 26 28   GLUCOSE 141* 133* 147*  BUN 25* 24* 34*  CREATININE  1.61* 1.50* 1.80*  CALCIUM 9.0 8.3* 8.4*   Liver Function Tests:  Recent Labs Lab 08/28/15 2111 08/29/15 0414 08/30/15 0025  AST 32 32 35  ALT 29 22 22   ALKPHOS 82 75 67  BILITOT 1.3* 1.4* 1.4*  PROT 8.1 7.1 7.4  ALBUMIN 4.0 3.4* 3.4*    Recent Labs Lab 08/28/15 2111 08/29/15 0053 08/29/15 0914 08/30/15 0025  LIPASE 950*  --  112* 22  AMYLASE  --  979*  --  133*   CBC:  Recent Labs Lab 08/28/15 2111 08/29/15 0414 08/30/15 0025  WBC 11.0* 11.1* 8.9  HGB 13.6 13.4 13.3  HCT 41.8 41.9 41.4  MCV 90.1 90.3 90.8  PLT 226 206 203   Cardiac Enzymes:  Recent Labs Lab 08/28/15 2111 08/29/15 0414 08/29/15 0914 08/29/15 1504 08/29/15 2021  TROPONINI 0.04* 0.09* 0.23* 0.31* 0.37*   CBG:  Recent Labs Lab 08/29/15 0840 08/29/15 1200 08/29/15 1604 08/29/15 2210  GLUCAP 136* 136* 127* 115*    Studies: Ct Abdomen Pelvis W Contrast 08/29/2015  Focal defect in the superior aspect of the spleen probably representing a small splenic infarct. The liver, gallbladder, pancreas, adrenal glands, abdominal aorta, inferior vena cava, kidneys, and retroperitoneal lymph nodes are unremarkable. Postoperative changes in the stomach consistent with partial gastrectomy and jejunal anastomosis. Dilated fluid-filled proximal small bowel with decompressed bowel distally. Transition zone appears to be in the left upper quadrant and may represent internal hernia or a adhesion. Small ventral abdominal hernia containing fat. Scattered stool in the colon without colonic distention. Small amount of free fluid in the pelvis along the liver edge and pericolic gutter. No free air. Pelvis: The appendix is normal. Bladder wall is not thickened. Prostate gland is enlarged, measuring 4.3 cm diameter. Small right-sided bladder diverticulum and mildly distended bladder may represent chronic bladder outflow obstruction. No free or loculated pelvic fluid collections. No pelvic mass or lymphadenopathy. No  evidence of diverticulitis. No destructive bone lesions. Small bowel obstruction with transition zone in the left upper quadrant, likely representing internal hernia or adhesion. Small ventral anterior abdominal wall hernia containing fat. Small amount of free fluid in the abdomen may be reactive. Prostate enlargement with distended bladder and bladder diverticulum may indicate bladder outflow obstruction.   Dg Abd Portable 1v-small Bowel Obstruction Protocol-initial, 8 Hr Delay 08/29/2015 Normal bowel gas pattern. Nasal gastric tube side port now projects at gastroesophageal junction.   Dg Abd Portable 1v-small Bowel Protocol-position Verification 08/29/2015   Distal tip of nasogastric tube seen in proximal stomach. No evidence of bowel obstruction or ileus.   Scheduled Meds: . antiseptic oral rinse  7 mL Mouth Rinse BID  . metoprolol  10 mg Intravenous Q8H  . nitroGLYCERIN  1 inch Topical Q6H  . sodium chloride flush  3 mL Intravenous Q12H   Continuous Infusions: . heparin 850 Units/hr (08/30/15 0130)    Time spent:   Faye Ramsay, MD [facility] Pager [phone]  If 7PM-7AM, please contact night-coverage [email or web address] Password [email or web address]  08/30/2015, 7:10 AM    LOS: 1 day

## 2015-08-30 NOTE — Care Management Important Message (Signed)
Important Message  Patient Details  Name: Robert [name] MRN: [id number] Date of Birth: Aug 02, 1940   Medicare Important Message Given:  Yes    [name] 08/30/2015, 10:30 AM

## 2015-08-30 NOTE — Progress Notes (Signed)
OT Cancellation Note  Patient Details Name: BURNETTE GARVIE MRN: [id number] DOB: [birth year]   Cancelled Treatment:    Reason Eval/Treat Not Completed:  (Troponin trending up according to last reading.)  Benito Mccreedy OTR/L [id number] 08/30/2015, 9:47 AM

## 2015-08-30 NOTE — Progress Notes (Signed)
ANTICOAGULATION CONSULT NOTE - Follow Up Consult  Pharmacy Consult for Heparin Indication: MVR  Allergies  Allergen Reactions  . Penicillins Other (See Comments)  . Vancomycin Other (See Comments)    Kidney problems    Patient Measurements: Height: 5\' 6"  (167.6 cm) Weight: 117 lb 15.1 oz (53.5 kg) IBW/kg (Calculated) : 63.8 Heparin Dosing Weight: 53.5 kg  Vital Signs: Temp: 97.4 F (36.3 C) (06/19 0827) Temp Source: Oral (06/19 0827) BP: 160/67 mmHg (06/19 1152) Pulse Rate: 57 (06/19 1152)  Labs:  Recent Labs  08/28/15 2111 08/29/15 0414 08/29/15 0914 08/29/15 1504 08/29/15 2021 08/30/15 0025 08/30/15 0854  HGB 13.6 13.4  --   --   --  13.3  --   HCT 41.8 41.9  --   --   --  41.4  --   PLT 226 206  --   --   --  203  --   APTT  --  35  --   --   --   --   --   LABPROT 27.6* 26.0*  --   --   --  24.9*  --   INR 2.62* 2.42*  --   --   --  2.27*  --   HEPARINUNFRC  --   --   --  0.52  --  0.69 0.76*  CREATININE 1.61* 1.50*  --   --   --  1.80*  --   TROPONINI 0.04* 0.09* 0.23* 0.31* 0.37*  --   --     Estimated Creatinine Clearance: 26.4 mL/min (by C-G formula based on Cr of 1.8).  Assessment:  Coumadin for hx MVR on hold due to SBO.  INR goal 2.5-3.5, last dose 6/16 prior to admission.  Heparin drip begun on 6/18 when INR down to 2.42.    Heparin level is just above goal (0.76) on 850 units/hr. INR down to 2.27.  Goal of Therapy:  Heparin level 0.3-0.7 units/ml Monitor platelets by anticoagulation protocol: Yes   Plan:   Decrease heparin drip to 800 units/hr.  Hesitant to decrease further as Coumadin effects diminishing.  Heparin level ~8 hrs after decrease.  Daily heparin level, CBC and PT/INR.    Arty Baumgartner, [place] Pager: [phone] 08/30/2015,12:18 PM

## 2015-08-30 NOTE — Progress Notes (Signed)
Pt BP during morning vitals 180/71.  Pt denies any chest pain or SOB.  Does endorse some LLQ intermittent abdominal pain, worse when eating.  Dr. Doyle Askew notified, no orders received.  PRN dose of hydralazine administered to patient at 1028.  BP recheck at 1152 160/67.  Still denying chest pain and SOB, endorsing LLQ intermittent abdomina; pain, not relieved with ambulation.  Dr. Doyle Askew made aware.  Will monitor. Princella Pellegrini

## 2015-08-30 NOTE — Progress Notes (Signed)
Initial Nutrition Assessment  DOCUMENTATION CODES:   Severe malnutrition in context of chronic illness  INTERVENTION:  Provide Boost Breeze po TID, each supplement provides 250 kcal and 9 grams of protein.  Encourage adequate PO intake.   NUTRITION DIAGNOSIS:   Malnutrition related to chronic illness as evidenced by energy intake < or equal to 75% for > or equal to 1 month, severe depletion of muscle mass.  GOAL:   Patient will meet greater than or equal to 90% of their needs  MONITOR:   PO intake, Supplement acceptance, Diet advancement, Weight trends, Labs, I & O's  REASON FOR ASSESSMENT:   Consult Assessment of nutrition requirement/status  ASSESSMENT:   75 yo with mechanical mitral valve on coumadin, HTN, HPL, CKD, DM2 with remote h/o gastric cancer s/p partial gastrectomy at [facility] in 2005 comes in with generalized abd pain which started on Saturday. Pain was constant, didn't go away, developed n/v so came to ED. Last BM was Saturday morning. +burping. Has had some mild postprandial upper abd pain for awhile but is very limited and goes away rather quickly o/w no previous symptoms. No melena/hematochezia.  CT-scan showed small bowel obstruction with transition zone in the left upper quadrant, likely representing internal hernia or adhesion. NGT put.   Pt is currently on a clear liquid diet. Wife at bedside. Pt reports having a lack of appetite. Pt with no po intake this AM. Pt reports her abdominal pains have been on and off. Noted pt is a vegetarian and does not consume any animal products bedsides dairy products. Pt does not consumes eggs. Wife reports pt usually consumes only 50% of meals since his gastrectomy from 2005. Usual body weight reported to be ~125 lbs. Pt with a 6.4% weight loss in 3 months, not significant for time frame. RD to order Boost Breeze to aid in caloric and protein needs. Wife requested information on protein rich foods pt can consume. Education given.  Pt reports wanting diet to be advanced to a full liquid diet as pt dislikes the taste of broths serving at meals. RD to continue to monitor.   Nutrition-Focused physical exam completed. Findings are moderate fat depletion, moderate to severe muscle depletion, and no edema.   Labs and medications reviewed.   Diet Order:  Diet clear liquid Room service appropriate?: Yes; Fluid consistency:: Thin  Skin:  Reviewed, no issues  Last BM:  6/18  Height:   Ht Readings from Last 1 Encounters:  08/29/15 5\' 6"  (1.676 m)    Weight:   Wt Readings from Last 1 Encounters:  08/29/15 117 lb 15.1 oz (53.5 kg)    Ideal Body Weight:  59 kg  BMI:  Body mass index is 19.05 kg/(m^2).  Estimated Nutritional Needs:   Kcal:  1600-1900  Protein:  70-85 grams  Fluid:  1.6 - 1.9 L/day  EDUCATION NEEDS:   Education needs addressed  Corrin Parker, MS, RD, LDN Pager # [phone] After hours/ weekend pager # [phone]

## 2015-08-30 NOTE — Progress Notes (Signed)
Subjective: Pt passed SB protocol.  Still has a little pain on the left.  Does not report flatus.    Objective: Vital signs in last 24 hours: Temp:  [97.2 F (36.2 C)-98.1 F (36.7 C)] 97.4 F (36.3 C) (06/19 0827) Pulse Rate:  [50-86] 50 (06/19 0827) Resp:  [16-18] 18 (06/19 0827) BP: (160-186)/(66-90) 180/71 mmHg (06/19 0827) SpO2:  [96 %-100 %] 100 % (06/19 0827) Weight:  [53.5 kg (117 lb 15.1 oz)] 53.5 kg (117 lb 15.1 oz) (06/18 2212) Last BM Date: 08/29/15  Intake/Output from previous day: 06/18 0701 - 06/19 0700 In: 238.1 [P.O.:80; I.V.:158.1] Out: 1503 [Urine:3; Emesis/NG output:1500] Intake/Output this shift: Total I/O In: 120 [P.O.:120] Out: 0   General appearance: alert, cooperative and no distress Resp: breathing comfortably GI: soft, non distended, non tender.  Lab Results:   Recent Labs  08/29/15 0414 08/30/15 0025  WBC 11.1* 8.9  HGB 13.4 13.3  HCT 41.9 41.4  PLT 206 203   BMET  Recent Labs  08/29/15 0414 08/30/15 0025  NA 140 140  K 4.0 4.0  CL 103 103  CO2 26 28  GLUCOSE 133* 147*  BUN 24* 34*  CREATININE 1.50* 1.80*  CALCIUM 8.3* 8.4*   PT/INR  Recent Labs  08/29/15 0414 08/30/15 0025  LABPROT 26.0* 24.9*  INR 2.42* 2.27*   ABG No results for input(s): PHART, HCO3 in the last 72 hours.  Invalid input(s): PCO2, PO2  Studies/Results: Ct Abdomen Pelvis W Contrast  08/29/2015  CLINICAL DATA:  Abdominal pain and vomiting since yesterday. New onset pancreatitis. Previous history of partial gastrectomy. EXAM: CT ABDOMEN AND PELVIS WITH CONTRAST TECHNIQUE: Multidetector CT imaging of the abdomen and pelvis was performed using the standard protocol following bolus administration of intravenous contrast. CONTRAST:  75 ml ISOVUE-300 IOPAMIDOL (ISOVUE-300) INJECTION 61% COMPARISON:  PET-CT 01/17/2005 FINDINGS: Dependent changes in the lung bases. Cardiac enlargement with dilated left atrium. Mitral valve replacement. Small esophageal  hiatal hernia. 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 The liver, gallbladder, pancreas, adrenal glands, abdominal aorta, inferior vena cava, kidneys, and retroperitoneal lymph nodes are unremarkable. Postoperative changes in the stomach consistent with partial gastrectomy and jejunal anastomosis. Dilated fluid-filled proximal small bowel with decompressed bowel distally. Transition zone appears to be in the left upper quadrant and may represent internal hernia or a adhesion. Small ventral abdominal hernia containing fat. Scattered stool in the colon without colonic distention. Small amount of free fluid in the pelvis along the liver edge and pericolic gutter. No free air. Pelvis: The appendix is normal. Bladder wall is not thickened. Prostate gland is enlarged, measuring 4.3 cm diameter. Small right-sided bladder diverticulum and mildly distended bladder may represent chronic bladder outflow obstruction. No free or loculated pelvic fluid collections. No pelvic mass or lymphadenopathy. No evidence of diverticulitis. No destructive bone lesions. Small bowel obstruction with transition zone in the left upper quadrant, likely representing internal hernia or adhesion. Small ventral anterior abdominal wall hernia containing fat. Small amount of free fluid in the abdomen may be reactive. Prostate enlargement with distended bladder and bladder diverticulum may indicate bladder outflow obstruction. Electronically Signed   By: Lucienne Capers M.D.   On: 08/29/2015 02:48   Dg Abd Portable 1v-small Bowel Obstruction Protocol-initial, 8 Hr Delay  08/29/2015  CLINICAL DATA:  Follow-up bowel obstruction. Persistent periumbilical pain. EXAM: PORTABLE ABDOMEN - 1 VIEW COMPARISON:  Abdominal radiographs August 29, 2015 at 0809 hours FINDINGS: Contrast in the  urinary bladder. Bowel gas pattern is nondilated and nonobstructive. Nasogastric tube projects in proximal stomach  with side port at the level of the gastroesophageal junction. Surgical staples project in LEFT upper quadrant. No intra-abdominal mass effect or pathologic calcifications. Soft tissue planes and included osseous structure nonsuspicious. IMPRESSION: Normal bowel gas pattern. Nasal gastric tube side port now projects at gastroesophageal junction. Electronically Signed   By: Elon Alas M.D.   On: 08/29/2015 18:15   Dg Abd Portable 1v-small Bowel Protocol-position Verification  08/29/2015  CLINICAL DATA:  Encounter for nasogastric tube placement. EXAM: PORTABLE ABDOMEN - 1 VIEW COMPARISON:  None. FINDINGS: The bowel gas pattern is normal. No radio-opaque calculi or other significant radiographic abnormality are seen. Distal tip of nasogastric tube is seen in proximal stomach. Residual contrast is noted in urinary bladder. IMPRESSION: Distal tip of nasogastric tube seen in proximal stomach. No evidence of bowel obstruction or ileus. Electronically Signed   By: Marijo Conception, M.D.   On: 08/29/2015 07:36    Anti-infectives: Anti-infectives    None      Assessment/Plan: p SBO  Follow up films negative for residual obstructive pattern and passed SB protocol.   Diet as tolerated.   Still no flatus, which might would wait for prior to d/c.   LOS: 1 day    [facility] 08/30/2015

## 2015-08-30 NOTE — Progress Notes (Signed)
ANTICOAGULATION CONSULT NOTE - Follow Up Consult  Pharmacy Consult for heparin Indication: MVR  Labs:  Recent Labs  08/28/15 2111 08/29/15 0414 08/29/15 0914 08/29/15 1504 08/29/15 2021 08/30/15 0025  HGB 13.6 13.4  --   --   --  13.3  HCT 41.8 41.9  --   --   --  41.4  PLT 226 206  --   --   --  203  APTT  --  35  --   --   --   --   LABPROT 27.6* 26.0*  --   --   --  24.9*  INR 2.62* 2.42*  --   --   --  2.27*  HEPARINUNFRC  --   --   --  0.52  --  0.69  CREATININE 1.61* 1.50*  --   --   --   --   TROPONINI 0.04* 0.09* 0.23* 0.31* 0.37*  --     Assessment: 75yo male remains therapeutic on heparin though trending up and now at high end of goal.  Goal of Therapy:  Heparin level 0.3-0.7 units/ml   Plan:  Will decrease heparin gtt slightly to 850 units/hr and check level in [place], PharmD, BCPS  08/30/2015,1:15 AM

## 2015-08-30 NOTE — Progress Notes (Signed)
08/30/2015 6:19 PM  BP 201/76.  Pt denies chest pain or SOB.  Does endorse some LLQ pain, which is not a new issue.  PRN hydralazine administered per prn order.  Pt wife also requests an update on KUB results and plan of care.  Dr. Doyle Askew notified, new orders received.  MD is to call patient/family shortly.  Will continue to monitor.  Princella Pellegrini

## 2015-08-30 NOTE — Progress Notes (Signed)
ANTICOAGULATION CONSULT NOTE - Follow Up Consult  Pharmacy Consult for Heparin Indication: MVR  Allergies  Allergen Reactions  . Penicillins Other (See Comments)  . Vancomycin Other (See Comments)    Kidney problems    Patient Measurements: Height: 5\' 6"  (167.6 cm) Weight: 117 lb 15.1 oz (53.5 kg) IBW/kg (Calculated) : 63.8 Heparin Dosing Weight: 53.5 kg  Vital Signs: Temp: 98.9 F (37.2 C) (06/19 1816) Temp Source: Oral (06/19 1816) BP: 176/70 mmHg (06/19 1917) Pulse Rate: 70 (06/19 1816)  Labs:  Recent Labs  08/28/15 2111 08/29/15 0414 08/29/15 0914  08/29/15 1504 08/29/15 2021 08/30/15 0025 08/30/15 0854 08/30/15 2003  HGB 13.6 13.4  --   --   --   --  13.3  --   --   HCT 41.8 41.9  --   --   --   --  41.4  --   --   PLT 226 206  --   --   --   --  203  --   --   APTT  --  35  --   --   --   --   --   --   --   LABPROT 27.6* 26.0*  --   --   --   --  24.9*  --   --   INR 2.62* 2.42*  --   --   --   --  2.27*  --   --   HEPARINUNFRC  --   --   --   < > 0.52  --  0.69 0.76* 0.56  CREATININE 1.61* 1.50*  --   --   --   --  1.80*  --   --   TROPONINI 0.04* 0.09* 0.23*  --  0.31* 0.37*  --   --   --   < > = values in this interval not displayed.  Estimated Creatinine Clearance: 26.4 mL/min (by C-G formula based on Cr of 1.8).  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 Last dose PTA 6/16 .  Now on IV heparin. Level was elevated earlier- now therapeutic on 800 units/hr at 0.56 units/mL.  No bleeding noted. CBC wnl.  Goal of Therapy:  Heparin level 0.3-0.7 units/ml INR 2.5-3.5 Monitor platelets by anticoagulation protocol: Yes   Plan:  -Continue heparin drip at 800 units/hr -Daily heparin level, CBC and INR   Lilyauna Miedema D. Geffrey Michaelsen, PharmD, BCPS Clinical Pharmacist Pager: [phone] 08/30/2015 8:45 PM

## 2015-08-30 NOTE — Consult Note (Signed)
CARDIOLOGY CONSULT NOTE   Patient ID: Robert [name] MRN: [id number] DOB/AGE: June 13, 1940 75 y.o.  Admit date: 08/28/2015  Primary Physician   Robert Freestone, MD Primary Cardiologist   Dr. Debara [name] Reason for Consultation   Elevated troponin Requesting Physician  Dr. Doyle [name]  HPI: Robert [name] is a 75 y.o. 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   He has been followed by Dr. Geraldo [name] at [facility] cardiology and was referred to Dr. Evelina [name] at [facility] in 2013 for evaluation of his mitral stenosis. He was followed clinically and underwent mechanical mitral valve replacement in 2015.   He was admitted Feb/March 2017 and treated for heart failure with diuresis. He underwent right and left heart catheterization which demonstrated the following:  Prox RCA lesion, 20% stenosed. Prox LAD to Mid LAD lesion, 30% stenosed. Mid LAD lesion, 40% stenosed. Ost 1st Diag to 1st Diag lesion, 50% stenosed. Ost 2nd Diag lesion, 30% stenosed.  1. Mild to moderate non-obstructive CAD 2. Normal movement of mechanical mitral valve leaflets.  3. Elevated filling pressures suggesting residual volume overload.    He was doing well on cardiac standpoint when last seen by Dr. Debara [name] 06/03/15. He was in [place] of up until Saturday 6/17 when he had diffuse abdominal pain associated with vomiting. No blood in vomit. The patient denies  fever, chills, hest pain, palpitations, shortness of breath, orthopnea, PND, dizziness, syncope, cough, congestion,  hematochezia, melena, lower extremity edema. Upon arrival BP was elevated at 227/102. Work up reveled SBO  with transition zone in the left upper quadrant, likely representing internal hernia or adhesion on CT. Seen by General surgery -> medical management. Removed NG tube.  Now on clear diet. No gas or BM yet. Elevated amylase and lipase but no radiological evidence of pancreatitis.   Cardiology is consulted trending up troponin. 0.04-->0.09-->0.23-->0.31-->0.37. EKG showed sinus rhythm at controlled rate, LVH. Recent EKG showed TWI in inferior lateral lead.  prolonged QT. Review of tele concerning of arrhythmias. Scr seems at baseline 1.6-1.8. On IV heparin currently.   Past Medical History  Diagnosis Date  . Hypertension   . High cholesterol   . Chronic anticoagulation     coumadin for mechanical valves  . BPH (benign prostatic hyperplasia)   . CKD (chronic kidney disease), stage III   . Gastric cancer ([place]) 2005    s/p gastrectomy  . Rheumatic heart disease   . HOH (hard of hearing)   . Diabetes mellitus type 2, diet-controlled ([place])   . S/P MVR (mitral valve replacement) 2015    32mm StJude mechanical valve     Past Surgical History  Procedure Laterality Date  . Mitral valve replacement  10/24/2013    90mm StJude mechanical valve  . Partial gastrectomy  2005  . Hernia repair    . Cardiac catheterization N/A 05/10/2015    Procedure: Right/Left Heart Cath and Coronary Angiography;  Surgeon: Burnell Blanks, MD;  Location: [facility];  Service: Cardiovascular;  Laterality: N/A;    Allergies  Allergen Reactions  . Penicillins Other (See Comments)  . Vancomycin Other (See Comments)    Kidney problems    I  have reviewed the patient's current medications . antiseptic oral rinse  7 mL Mouth Rinse BID  . metoprolol  10 mg Intravenous Q8H  . nitroGLYCERIN  1 inch Topical Q6H  . sodium chloride flush  3 mL Intravenous Q12H   . heparin 850 Units/hr (08/30/15 1054)   acetaminophen **OR** acetaminophen, hydrALAZINE, morphine injection, ondansetron (ZOFRAN) IV  Prior  to Admission medications   Medication Sig Start Date End Date Taking? Authorizing Provider  aspirin 81 MG chewable tablet Chew 81 mg by mouth daily.   Yes Historical Provider, MD  atorvastatin (LIPITOR) 20 MG tablet Take 20 mg by mouth at bedtime.   Yes Historical Provider, MD  carvedilol (COREG) 6.25 MG tablet Take 1 tablet (6.25 mg total) by mouth 2 (two) times daily with a meal. 06/10/15  Yes Pixie Casino, MD  ferrous sulfate 325 (65 FE) MG tablet Take 1 tablet (325 mg total) by mouth 2 (two) times daily with a meal. 05/13/15  Yes Belkys A Regalado, MD  furosemide (LASIX) 40 MG tablet Take 1 tablet (40 mg total) by mouth daily. 08/19/15  Yes Pixie Casino, MD  isosorbide mononitrate (IMDUR) 30 MG 24 hr tablet Take 1 tablet (30 mg total) by mouth daily. 06/10/15  Yes Pixie Casino, MD  Multiple Vitamin (MULTIVITAMIN WITH MINERALS) TABS tablet Take 1 tablet by mouth daily.   Yes Historical Provider, MD  tamsulosin (FLOMAX) 0.4 MG CAPS capsule Take 1 capsule (0.4 mg total) by mouth daily after supper. 06/10/15  Yes Pixie Casino, MD  vitamin B-12 (CYANOCOBALAMIN) 1000 MCG tablet Take 1 tablet (1,000 mcg total) by mouth daily. 05/13/15  Yes Belkys A Regalado, MD  warfarin (COUMADIN) 3 MG tablet Take 1-1.5 tablets by mouth daily as directed by coumadin clinic Patient taking differently: Take 3-4.5 mg by mouth daily at 6 PM. Take 1 and 1/2 tablets on Monday and Friday then take 1 tablet all there other days 08/13/15  Yes Pixie Casino, MD     Social History   Social History  . Marital Status: Married    Spouse Name: N/A  . Number of Children: N/A  . Years of Education: N/A   Occupational History  . Mathematics professor at [employer] History Main Topics  . Smoking status: Never Smoker   . Smokeless tobacco: Never Used  . Alcohol Use: No  . Drug Use: No  . Sexual Activity: Not on file   Other Topics Concern  . Not on file   Social History Narrative   Lives in [place]  with his wife.    Family Status  Relation Status Death Age  . Mother Deceased   . Father Deceased    Family History  Problem Relation Age of Onset  . Hypertension Mother   . Diabetes Mother   . Cancer Brother    ROS:  Full 14 point review of systems complete and found to be negative unless listed above.  Physical Exam: Blood pressure 160/67, pulse 57, temperature 97.4 F (36.3 C), temperature source Oral, resp. rate 18, height 5\' 6"  (1.676 m), weight 117 lb 15.1 oz (53.5 kg), SpO2 100 %.  General: Well developed, well nourished, male in no acute distress Head: Eyes PERRLA, No xanthomas. Normocephalic and atraumatic, oropharynx without edema or exudate.  Lungs: Resp regular and unlabored, CTA. Heart: RRR no s3, s4. Systolic  murmurs..   Neck: No carotid bruits. No lymphadenopathy. No JVD. Abdomen: Bowel sounds present, abdomen soft  and non-tender without masses or hernias noted. Msk:  No spine or cva tenderness. No weakness, no joint deformities or effusions. Extremities: No clubbing, cyanosis or edema. DP/PT/Radials 2+ and equal bilaterally. Neuro: Alert and oriented X 3. No focal deficits noted. Psych:  Good affect, responds appropriately Skin: No rashes or lesions noted.  Labs:   Lab Results  Component Value Date   WBC 8.9 08/30/2015   HGB 13.3 08/30/2015   HCT 41.4 08/30/2015   MCV 90.8 08/30/2015   PLT 203 08/30/2015    Recent Labs  08/30/15 0025  INR 2.27*    Recent Labs Lab 08/30/15 0025  NA 140  K 4.0  CL 103  CO2 28  BUN 34*  CREATININE 1.80*  CALCIUM 8.4*  PROT 7.4  BILITOT 1.4*  ALKPHOS 67  ALT 22  AST 35  GLUCOSE 147*  ALBUMIN 3.4*   MAGNESIUM  Date Value Ref Range Status  05/08/2015 2.1 1.7 - 2.4 mg/dL Final    Recent Labs  08/29/15 0414 08/29/15 0914 08/29/15 1504 08/29/15 2021  TROPONINI 0.09* 0.23* 0.31* 0.37*   No results for input(s): TROPIPOC in the last 72 hours. No results found for: PROBNP Lab Results  Component  Value Date   CHOL 148 08/29/2015   HDL 45 08/29/2015   LDLCALC 92 08/29/2015   TRIG 54 08/29/2015   No results found for: DDIMER LIPASE  Date/Time Value Ref Range Status  08/30/2015 12:25 AM 22 11 - 51 U/L Final   AMYLASE  Date/Time Value Ref Range Status  08/30/2015 12:25 AM 133* 28 - 100 U/L Final   No results found for: TSH, T4TOTAL, T3FREE, THYROIDAB VITAMIN B-12  Date/Time Value Ref Range Status  05/06/2015 09:22 PM 114* 180 - 914 pg/mL Final    Comment:    (NOTE) This assay is not validated for testing neonatal or myeloproliferative syndrome specimens for Vitamin B12 levels.    FOLATE  Date/Time Value Ref Range Status  05/06/2015 09:22 PM 50.2 >5.9 ng/mL Final    Comment:    RESULTS CONFIRMED BY MANUAL DILUTION   FERRITIN  Date/Time Value Ref Range Status  05/06/2015 09:22 PM 37 24 - 336 ng/mL Final   TIBC  Date/Time Value Ref Range Status  05/06/2015 09:22 PM 372 250 - 450 ug/dL Final   IRON  Date/Time Value Ref Range Status  05/06/2015 09:22 PM 32* 45 - 182 ug/dL Final   RETIC CT PCT  Date/Time Value Ref Range Status  05/06/2015 09:22 PM 2.2 0.4 - 3.1 % Final    Echo: 05/07/15 LV EF: 45% - 50%  ------------------------------------------------------------------- Indications: Dyspnea 786.09.  ------------------------------------------------------------------- History: PMH: Mitral valve disease. Risk factors: Hypertension. Diabetes mellitus. Hypercholesterolemia.  ------------------------------------------------------------------- Study Conclusions  - Left ventricle: The cavity size was normal. There was moderate  focal basal hypertrophy of the septum. Systolic function was  mildly reduced. The estimated ejection fraction was in the range  of 45% to 50%. There is more significant inferoapical  hypokinesis. The study is not technically sufficient to allow  evaluation of LV diastolic function. - Aortic valve: Trileaflet. There  was mild to moderate  regurgitation. - Mitral valve: There is a tilting bileaflet mechanical valve in  the mitral position with normal leaflet motion. Mean gradient of  3 mmHg. No evidence for obstruction. Valve area by continuity  equation (using LVOT flow): 1.09 cm^2. - Left atrium: The atrium was mildly dilated. - Tricuspid valve: There was mild regurgitation. - Pulmonary arteries: PA peak pressure: 38 mm  Hg (S) + RAP.  Impressions:  - LVEF 45-50%, inferior hypokinesis, trileaflet sclerotic aortic  valve with mild to moderate regurgitation, tilting bileaflet  mechanical AVR without obstruction and normal bileaflet motion,  mild TR, RVSP 38 mmHG + RAP.  Cath 05/10/15 Right/Left Heart Cath and Coronary Angiography    Conclusion    Prox RCA lesion, 20% stenosed. Prox LAD to Mid LAD lesion, 30% stenosed. Mid LAD lesion, 40% stenosed. Ost 1st Diag to 1st Diag lesion, 50% stenosed. Ost 2nd Diag lesion, 30% stenosed.  1. Mild to moderate non-obstructive CAD 2. Normal movement of mechanical mitral valve leaflets.  3. Elevated filling pressures suggesting residual volume overload.   Recommendations: Given his volume overload, would continue diuresis with IV Lasix. Resume coumadin tonight. Heparin drip to resume 8 hours post sheath pull. Medical management of CAD.      ECG:  EKG showed sinus rhythm at controlled rate, LVH. Vent. rate 65 BPM PR interval 168 ms QRS duration 78 ms QT/QTc 526/547 ms P-R-T axes 75 -28 -85  Radiology:  Ct Abdomen Pelvis W Contrast  08/29/2015  CLINICAL DATA:  Abdominal pain and vomiting since yesterday. New onset pancreatitis. Previous history of partial gastrectomy. EXAM: CT ABDOMEN AND PELVIS WITH CONTRAST TECHNIQUE: Multidetector CT imaging of the abdomen and pelvis was performed using the standard protocol following bolus administration of intravenous contrast. CONTRAST:  75 ml ISOVUE-300 IOPAMIDOL (ISOVUE-300) INJECTION 61% COMPARISON:   PET-CT 01/17/2005 FINDINGS: Dependent changes in the lung bases. Cardiac enlargement with dilated left atrium. Mitral valve replacement. Small esophageal hiatal hernia. 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 The liver, gallbladder, pancreas, adrenal glands, abdominal aorta, inferior vena cava, kidneys, and retroperitoneal lymph nodes are unremarkable. Postoperative changes in the stomach consistent with partial gastrectomy and jejunal anastomosis. Dilated fluid-filled proximal small bowel with decompressed bowel distally. Transition zone appears to be in the left upper quadrant and may represent internal hernia or a adhesion. Small ventral abdominal hernia containing fat. Scattered stool in the colon without colonic distention. Small amount of free fluid in the pelvis along the liver edge and pericolic gutter. No free air. Pelvis: The appendix is normal. Bladder wall is not thickened. Prostate gland is enlarged, measuring 4.3 cm diameter. Small right-sided bladder diverticulum and mildly distended bladder may represent chronic bladder outflow obstruction. No free or loculated pelvic fluid collections. No pelvic mass or lymphadenopathy. No evidence of diverticulitis. No destructive bone lesions. Small bowel obstruction with transition zone in the left upper quadrant, likely representing internal hernia or adhesion. Small ventral anterior abdominal wall hernia containing fat. Small amount of free fluid in the abdomen may be reactive. Prostate enlargement with distended bladder and bladder diverticulum may indicate bladder outflow obstruction. Electronically Signed   By: Robert Nieves M.D.   On: 08/29/2015 02:48   Dg Abd Portable 1v-small Bowel Obstruction Protocol-initial, 8 Hr Delay  08/29/2015  CLINICAL DATA:  Follow-up bowel obstruction. Persistent periumbilical pain. EXAM: PORTABLE ABDOMEN - 1 VIEW COMPARISON:  Abdominal radiographs August 29, 2015 at  0809 hours FINDINGS: Contrast in the urinary bladder. Bowel gas pattern is nondilated and nonobstructive. Nasogastric tube projects in proximal stomach with side port at the level of the gastroesophageal junction. Surgical staples project in LEFT upper quadrant. No intra-abdominal mass effect or pathologic calcifications. Soft tissue planes and included osseous structure nonsuspicious. IMPRESSION: Normal bowel gas pattern. Nasal gastric tube side port now projects at gastroesophageal junction. Electronically Signed   By: Pernell Dupre  Bloomer M.D.   On: 08/29/2015 18:15   Dg Abd Portable 1v-small Bowel Protocol-position Verification  08/29/2015  CLINICAL DATA:  Encounter for nasogastric tube placement. EXAM: PORTABLE ABDOMEN - 1 VIEW COMPARISON:  None. FINDINGS: The bowel gas pattern is normal. No radio-opaque calculi or other significant radiographic abnormality are seen. Distal tip of nasogastric tube is seen in proximal stomach. Residual contrast is noted in urinary bladder. IMPRESSION: Distal tip of nasogastric tube seen in proximal stomach. No evidence of bowel obstruction or ileus. Electronically Signed   By: Marijo Conception, M.D.   On: 08/29/2015 07:36    ASSESSMENT AND PLAN:     1. Elevated troponin - Trending up 0.04-->0.09-->0.23-->0.31-->0.37. The patient is asymptomatic. 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 He has new TWI inferior laterally on most recent EKG. Agree with repeat echo. Will get repeat EKG and troponin in AM. IF highly abnormal will consider ischemic eval. For now, just had cardiac cath which showed non obstructive disease (med mgt. )  2. Hypertensive urgency - BP of 227/102 on presentation. On IV hydralazine and IV metoprolol. Needs better control. Most recent reading 160/67. Likely precipitating troponin elevation as well.   3. Sinus arrhythmias - Review of tele showed paroxysmal atrial tachycardia. Strip saved. He is chronically on anticoagulation.   4.  Chronic systolic (congestive) heart failure ([place]) - Recent echo 04/2015 showed EF of 45-50%. He is euvolemic. BNP of 816. Compliant with diet and salt restriction. Watch sign of volume overload when holding lasix. Pending echo.   5. CKD (chronic kidney disease) stage 3, GFR 30-59 ml/min - Seems creatinine at baseline.   6. H/O mitral valve replacement with mechanical valve - Coumadin to heparin per pharmacy for possible surgery.   7. Prolonged QT - Avoid QT prolonging agent.     SignedLeanor Kail, PA 08/30/2015, 12:40 PM Pager [phone]  Co-Sign MD  Personally seen and examined. Agree with above.  75 year old with SBO, elevated troponin, Abnormal ECG (TWI deep), recent cath 3 months ago (med mgt), mechanical MV (holding coumadin on heparin), with NO CP, no SOB.  -Await ECHO -repeating ECG (? If TWI have improved as well as long QT - could have been ischemic in the setting of HTN urgency) -Brief PAT (atrial tach) noted on ecg.  -Trop elevation in the setting of hypertensive urgency.  -MECHANICAL MV. Needs continuous anticoagulation.  Will follow. Was walking hallway. Mild hypoactive BS LUQ.  Candee Furbish, MD

## 2015-08-31 ENCOUNTER — Other Ambulatory Visit (HOSPITAL_COMMUNITY): Payer: BC Managed Care – PPO

## 2015-08-31 ENCOUNTER — Inpatient Hospital Stay (HOSPITAL_COMMUNITY): Payer: BC Managed Care – PPO

## 2015-08-31 LAB — COMPREHENSIVE METABOLIC PANEL
ALK PHOS: 56 U/L (ref 38–126)
ALT: 23 U/L (ref 17–63)
AST: 37 U/L (ref 15–41)
Albumin: 3.2 g/dL — ABNORMAL LOW (ref 3.5–5.0)
Anion gap: 11 (ref 5–15)
BUN: 44 mg/dL — AB (ref 6–20)
CALCIUM: 8.5 mg/dL — AB (ref 8.9–10.3)
CO2: 27 mmol/L (ref 22–32)
CREATININE: 2.24 mg/dL — AB (ref 0.61–1.24)
Chloride: 103 mmol/L (ref 101–111)
GFR, EST AFRICAN AMERICAN: 31 mL/min — AB (ref >60)
GFR, EST NON AFRICAN AMERICAN: 27 mL/min — AB (ref >60)
Glucose, Bld: 139 mg/dL — ABNORMAL HIGH (ref 65–99)
Potassium: 3.7 mmol/L (ref 3.5–5.1)
SODIUM: 141 mmol/L (ref 135–145)
Total Bilirubin: 1.7 mg/dL — ABNORMAL HIGH (ref 0.3–1.2)
Total Protein: 7.1 g/dL (ref 6.5–8.1)

## 2015-08-31 LAB — GLUCOSE, CAPILLARY: Glucose-Capillary: 139 mg/dL — ABNORMAL HIGH (ref 65–99)

## 2015-08-31 LAB — CBC
HCT: 39.9 % (ref 39.0–52.0)
Hemoglobin: 12.7 g/dL — ABNORMAL LOW (ref 13.0–17.0)
MCH: 29.1 pg (ref 26.0–34.0)
MCHC: 31.8 g/dL (ref 30.0–36.0)
MCV: 91.5 fL (ref 78.0–100.0)
PLATELETS: 198 10*3/uL (ref 150–400)
RBC: 4.36 MIL/uL (ref 4.22–5.81)
RDW: 14.3 % (ref 11.5–15.5)
WBC: 6.7 10*3/uL (ref 4.0–10.5)

## 2015-08-31 LAB — PROTIME-INR
INR: 3.1 — AB (ref 0.00–1.49)
PROTHROMBIN TIME: 31.3 s — AB (ref 11.6–15.2)

## 2015-08-31 LAB — LIPASE, BLOOD: Lipase: 36 U/L (ref 11–51)

## 2015-08-31 LAB — TROPONIN I: Troponin I: 0.16 ng/mL — ABNORMAL HIGH (ref <0.031)

## 2015-08-31 LAB — HEPARIN LEVEL (UNFRACTIONATED): HEPARIN UNFRACTIONATED: 0.3 [IU]/mL (ref 0.30–0.70)

## 2015-08-31 LAB — AMYLASE: Amylase: 183 U/L — ABNORMAL HIGH (ref 28–100)

## 2015-08-31 MED ORDER — HYDROMORPHONE HCL 1 MG/ML IJ SOLN
0.5000 mg | INTRAMUSCULAR | Status: DC | PRN
  Filled 2015-08-31: qty 1

## 2015-08-31 MED ORDER — HYDROCOD POLST-CPM POLST ER 10-8 MG/5ML PO SUER
5.0000 mL | Freq: Once | ORAL | Status: AC
  Administered 2015-08-31: 5 mL via ORAL
  Filled 2015-08-31: qty 5

## 2015-08-31 NOTE — Evaluation (Signed)
Occupational Therapy Evaluation Patient Details Name: Robert [name] MRN: [id number] DOB: 29-Jul-1940 Today's Date: 08/31/2015    History of Present Illness 75 y.o. with mechanical mitral valve on coumadin, HTN, HLD, CHF, CKD, DM2 with remote h/o gastric cancer s/p partial gastrectomy at [facility] in 2005 comes in with generalized abdominal pain which started on Saturday.   Clinical Impression   Pt admitted with above. Pt independent with ADLs, PTA. Feel pt will benefit from acute OT to increase independence and strength prior to d/c.     Follow Up Recommendations  No OT follow up;Supervision/Assistance - 24 hour    Equipment Recommendations  None recommended by OT (family has chair he can use for shower at home)    Recommendations for Other Services       Precautions / Restrictions Precautions Precautions: Fall;Other (comment) Precaution Comments: NG tube Restrictions Weight Bearing Restrictions: No      Mobility Bed Mobility Overal bed mobility: Needs Assistance Bed Mobility: Supine to Sit;Sit to Supine     Supine to sit: Min assist Sit to supine: Supervision   General bed mobility comments: wife assisted with supine to sit.  Transfers Overall transfer level: Needs assistance   Transfers: Sit to/from Stand Sit to Stand: Min guard              Balance      Pt reporting like he felt he needed UE support when simulating functional task while standing with single leg stance-OT gave Min guard assist.                                      ADL Overall ADL's : Needs assistance/impaired     Grooming: Wash/dry hands;Standing;Supervision/safety;Set up       Lower Body Bathing: Min guard (standing)       Lower Body Dressing: Sit to/from stand;Set up;Supervision/safety   Toilet Transfer: Min guard;Ambulation;Regular Toilet   Toileting- Water quality scientist and Hygiene: Sit to/from stand;Minimal assistance Toileting - Clothing  Manipulation Details (indicate cue type and reason): helped hold gown while pt managed pants.     Functional mobility during ADLs: Min guard General ADL Comments: Explained options for shower chair.  Wife does not want 3 in 1.      Vision     Perception     Praxis      Pertinent Vitals/Pain Pain Assessment: 0-10 Pain Score: 3  Pain Location: abdomen  Pain Descriptors / Indicators: Grimacing Pain Intervention(s): Monitored during session     Hand Dominance     Extremity/Trunk Assessment Upper Extremity Assessment Upper Extremity Assessment: RUE deficits/detail;LUE deficits/detail RUE Deficits / Details: weakness in bilateral shoulder flexors; less than full ROM shoulder flexion LUE Deficits / Details: weakness in bilateral shoulder flexors; less than full ROM shoulder flexion   Lower Extremity Assessment Lower Extremity Assessment: Defer to PT evaluation       Communication Communication Communication: No difficulties;Other (comment) (is from [place], but able to speak [place]; did not say much)   Cognition Arousal/Alertness: Awake/alert Behavior During Therapy: WFL for tasks assessed/performed Overall Cognitive Status: Within Functional Limits for tasks assessed                     General Comments       Exercises       Shoulder Instructions      Home Living Family/patient expects to be discharged to:: Private residence Living  Arrangements: Spouse/significant other Available Help at Discharge: Family;Available 24 hours/day Type of Home: House Home Access: Level entry     Home Layout: Two level Alternate Level Stairs-Number of Steps: 14 Alternate Level Stairs-Rails: Right Bathroom Shower/Tub: Occupational psychologist: Standard     Home Equipment: None          Prior Functioning/Environment Level of Independence: Independent        Comments: pt is professor of Conservation officer, nature at [employer]    OT Diagnosis: Generalized  weakness;Acute pain   OT Problem List: Decreased strength;Decreased range of motion;Decreased activity tolerance;Impaired balance (sitting and/or standing);Decreased knowledge of use of DME or AE;Pain;Decreased knowledge of precautions   OT Treatment/Interventions: Self-care/ADL training;DME and/or AE instruction;Therapeutic activities;Patient/family education;Balance training;Therapeutic exercise    OT Goals(Current goals can be found in the care plan section) Acute Rehab OT Goals Patient Stated Goal: not stated OT Goal Formulation: With patient/family Time For Goal Achievement: 09/07/15 Potential to Achieve Goals: Good ADL Goals Pt Will Perform Lower Body Bathing: sit to/from stand;with supervision (including gathering items) Pt Will Perform Lower Body Dressing: with supervision;sit to/from stand (including gathering items) Pt Will Transfer to Toilet: ambulating;with modified independence;regular height toilet Additional ADL Goal #1: pt will independently perform HEP for bilateral UEs to increase strength.  OT Frequency: Min 2X/week   Barriers to D/C:            Co-evaluation              End of Session Nurse Communication: Other (comment) (pt back in bed; urinated in urinal )  Activity Tolerance: Other (comment) (offered to walk, but pt wanted to rest) Patient left: in bed;with family/visitor present;with call bell/phone within reach;with bed alarm set   Time: [id number] OT Time Calculation (min): 15 min Charges:  OT General Charges $OT Visit: 1 Procedure OT Evaluation $OT Eval Moderate Complexity: 1 Procedure G-CodesBenito Mccreedy OTR/L [id number] 08/31/2015, 4:26 PM

## 2015-08-31 NOTE — Progress Notes (Signed)
Pharmacy Consult: New TPN Indication: Bowel obstruction  Pharmacy consulted to start TPN for bowel obstruction. May require surgery. TPN will start in the AM due to departmental policy. Will enter TPN labs and dietitian consult.   Elicia Lamp, PharmD, BCPS Clinical Pharmacist Pager [phone] 08/31/2015 3:36 PM

## 2015-08-31 NOTE — Progress Notes (Addendum)
PROGRESS NOTE  QUINLIN TOPPI  [id number]  DOB: 1940/12/06  DOA: 08/28/2015 PCP: Burman Freestone, MD  Hospital course: 75 y.o. male with medical history significant of gastric cancer (s/p of gastrectomy 2005, did not require chemotherapy or radiation therapy), hyperlipidemia, mitral valve replacement with mechanical valve on Coumadin, CKD-III, systolic congestive heart failure, who presented with vomiting and abdominal pain. Imaging studies in ED notable for SBO with transition zone in the LUQ, ? Internal hernia or adhesion, also focal defect, ? Splenic infarct.   Assessment & Plan:   Acute LUQ and LLQ pain, Partial SBO, ? Splenic infarct noted on CT abd, ? Acute pancreatitis  - repeat abd XRAY with unresolved SBO, abd xray pending this AM - lipase and amylase level also elevated on admission, unclear etiology, A1C and lipid panel stable  - continue conservative management for now - provide analgesia and antiemetics as needed, changed morphine to dilaudid as pt did not respond to morphine, pt more sleepy this am so encourage to hold off on dilaudid  - pt also had an episode of non bloody vomiting so will d/w surgery if NGT would be of benefit  - keep NPO for now  Chronic systolic (congestive) heart failure ([place]) - Cardiac on 05/07/15 showed EF 45-50%. on Lasix 40 mg daily at home.  - Lasix has been on hold for now  - home coreg also no hold and pt started on Metoprolol IV until PO intake improves   Hypertensive urgency - Blood pressure is elevated at 227/102--> 195/97. No chest pain. - currently on Metoprolol and Hydralazine - also added Hydralazine as needed  - BP more stable this AM  Elevated troponin - Most likely due to demanding ischemia secondary to hypertensive urgency. - Pt denies having chest pain, trop trending down - cardiology consulted   CKD-III - Baseline creatinine 1.8, now up and suspect from acute illness and contrast agent that pt has received with CT abd  and pelvis  - BMP in AM  H/O mitral valve replacement with mechanical valve - on Coumadin but currently  On hold, INR=3.10 - placed on Heparin drip for now - per pharmacy   BPH (benign prostatic hyperplasia) - on Flomax at home - Hold oral Flomax until pt able to take PO  - bladder scan today to rule out retention   HLD - holding Lipitor   Protein-calorie malnutrition, severe ([place]) - nutritionist consulted    DVT ppx: Heparin drip Code Status: No intubation, OK to do CPR  Family Communication: Yes, patient's wife at bed side, brother over the phone  Disposition Plan: Anticipate discharge back to previous home environment  Consultants:  General surgery  pharmacist  Procedures:  NG placement, now removed   Antimicrobials:  N/A   Subjective: Pt says that he still has pain but controlled with medications, had an episode of non bloody vomiting last night.   Objective: Filed Vitals:   08/30/15 1917 08/30/15 2134 08/31/15 0451 08/31/15 0552  BP: 176/70 121/74 110/59 118/63  Pulse:  75 77 79  Temp:  98.5 F (36.9 C) 98.3 F (36.8 C)   TempSrc:  Oral Oral   Resp:  18 18   Height:      Weight:      SpO2:  96% 91%     Intake/Output Summary (Last 24 hours) at 08/31/15 0700 Last data filed at 08/31/15 0600  Gross per 24 hour  Intake 547.84 ml  Output    275 ml  Net 272.84  ml   Filed Weights   08/29/15 0400 08/29/15 0526 08/29/15 2212  Weight: 56.3 kg (124 lb 1.9 oz) 55.4 kg (122 lb 2.2 oz) 53.5 kg (117 lb 15.1 oz)   Exam:  General exam: awake, alert, no apparent distress.  Respiratory system: Clear. No increased work of breathing. Cardiovascular system: S1 & S2 heard, RRR. No JVD, gallops, clicks or pedal edema. Gastrointestinal system: Abdomen is nondistended, soft and with LUQ and epigastric tenderness, no guarding Central nervous system: Alert and oriented. No focal neurological deficits. Extremities: no clubbing or cyanosis.   Data Reviewed: Basic  Metabolic Panel:  Recent Labs Lab 08/28/15 2111 08/29/15 0414 08/30/15 0025 08/31/15 0508  NA 139 140 140 141  K 4.1 4.0 4.0 3.7  CL 101 103 103 103  CO2 28 26 28 27   GLUCOSE 141* 133* 147* 139*  BUN 25* 24* 34* 44*  CREATININE 1.61* 1.50* 1.80* 2.24*  CALCIUM 9.0 8.3* 8.4* 8.5*   Liver Function Tests:  Recent Labs Lab 08/28/15 2111 08/29/15 0414 08/30/15 0025 08/31/15 0508  AST 32 32 35 37  ALT 29 22 22 23   ALKPHOS 82 75 67 56  BILITOT 1.3* 1.4* 1.4* 1.7*  PROT 8.1 7.1 7.4 7.1  ALBUMIN 4.0 3.4* 3.4* 3.2*    Recent Labs Lab 08/28/15 2111 08/29/15 0053 08/29/15 0914 08/30/15 0025 08/31/15 0508  LIPASE 950*  --  112* 22 36  AMYLASE  --  979*  --  133* 183*   CBC:  Recent Labs Lab 08/28/15 2111 08/29/15 0414 08/30/15 0025 08/31/15 0508  WBC 11.0* 11.1* 8.9 6.7  HGB 13.6 13.4 13.3 12.7*  HCT 41.8 41.9 41.4 39.9  MCV 90.1 90.3 90.8 91.5  PLT 226 206 203 198   Cardiac Enzymes:  Recent Labs Lab 08/29/15 0414 08/29/15 0914 08/29/15 1504 08/29/15 2021 08/31/15 0508  TROPONINI 0.09* 0.23* 0.31* 0.37* 0.16*   CBG:  Recent Labs Lab 08/29/15 0840 08/29/15 1200 08/29/15 1604 08/29/15 2210 08/30/15 0826  GLUCAP 136* 136* 127* 115* 112*    Studies: Ct Abdomen Pelvis W Contrast 08/29/2015  Focal defect in the superior aspect of the spleen probably representing a small splenic infarct. The liver, gallbladder, pancreas, adrenal glands, abdominal aorta, inferior vena cava, kidneys, and retroperitoneal lymph nodes are unremarkable. Postoperative changes in the stomach consistent with partial gastrectomy and jejunal anastomosis. Dilated fluid-filled proximal small bowel with decompressed bowel distally. Transition zone appears to be in the left upper quadrant and may represent internal hernia or a adhesion. Small ventral abdominal hernia containing fat. Scattered stool in the colon without colonic distention. Small amount of free fluid in the pelvis along  the liver edge and pericolic gutter. No free air. Pelvis: The appendix is normal. Bladder wall is not thickened. Prostate gland is enlarged, measuring 4.3 cm diameter. Small right-sided bladder diverticulum and mildly distended bladder may represent chronic bladder outflow obstruction. No free or loculated pelvic fluid collections. No pelvic mass or lymphadenopathy. No evidence of diverticulitis. No destructive bone lesions. Small bowel obstruction with transition zone in the left upper quadrant, likely representing internal hernia or adhesion. Small ventral anterior abdominal wall hernia containing fat. Small amount of free fluid in the abdomen may be reactive. Prostate enlargement with distended bladder and bladder diverticulum may indicate bladder outflow obstruction.   Dg Abd Portable 1v-small Bowel Obstruction Protocol-initial, 8 Hr Delay 08/29/2015 Normal bowel gas pattern. Nasal gastric tube side port now projects at gastroesophageal junction.   Dg Abd Portable 1v-small Bowel Protocol-position Verification  08/29/2015   Distal tip of nasogastric tube seen in proximal stomach. No evidence of bowel obstruction or ileus.   Scheduled Meds: . antiseptic oral rinse  7 mL Mouth Rinse BID  . feeding supplement  1 Container Oral TID BM  . hydrALAZINE  5 mg Intravenous QID  . metoprolol  10 mg Intravenous Q8H  . nitroGLYCERIN  1 inch Topical Q6H  . sodium chloride flush  3 mL Intravenous Q12H   Continuous Infusions: . heparin 800 Units/hr (08/30/15 1526)    Time spent:   Faye Ramsay, MD [facility] Pager [phone]  If 7PM-7AM, please contact night-coverage [email or web address] Password [email or web address] 08/31/2015, 7:00 AM    LOS: 2 days

## 2015-08-31 NOTE — Progress Notes (Signed)
Cardiologist: Dr. Debara Pickett Subjective:  Vomit. Bowel obstruction. No CP.   Objective:  Vital Signs in the last 24 hours: Temp:  [97.4 F (36.3 C)-98.9 F (37.2 C)] 97.4 F (36.3 C) (06/20 0728) Pulse Rate:  [57-79] 74 (06/20 0955) Resp:  [16-18] 16 (06/20 0728) BP: (110-201)/(58-76) 128/58 mmHg (06/20 0955) SpO2:  [91 %-96 %] 95 % (06/20 0955)  Intake/Output from previous day: 06/19 0701 - 06/20 0700 In: 547.8 [P.O.:360; I.V.:187.8] Out: 275 [Urine:275]   Physical Exam: General: Well developed, well nourished, in no acute distress. Head:  Normocephalic and atraumatic. Lungs: Clear to auscultation and percussion. Heart: Click S1 and normal S2.  No murmur, rubs or gallops.  Abdomen: hypoactive bowel sounds. Extremities: No clubbing or cyanosis. No edema. Neurologic: Alert and oriented x 3.    Lab Results:  Recent Labs  08/30/15 0025 08/31/15 0508  WBC 8.9 6.7  HGB 13.3 12.7*  PLT 203 198    Recent Labs  08/30/15 0025 08/31/15 0508  NA 140 141  K 4.0 3.7  CL 103 103  CO2 28 27  GLUCOSE 147* 139*  BUN 34* 44*  CREATININE 1.80* 2.24*    Recent Labs  08/29/15 2021 08/31/15 0508  TROPONINI 0.37* 0.16*   Hepatic Function Panel  Recent Labs  08/31/15 0508  PROT 7.1  ALBUMIN 3.2*  AST 37  ALT 23  ALKPHOS 56  BILITOT 1.7*    Recent Labs  08/29/15 0414  CHOL 148   No results for input(s): PROTIME in the last 72 hours.  Imaging: Dg Abd 2 Views  08/31/2015  CLINICAL DATA:  Vomiting EXAM: ABDOMEN - 2 VIEW COMPARISON:  08/30/2015 FINDINGS: Mitral valve prosthesis. Faint interstitial accentuation in the lung bases, right greater than left. Residual contrast medium in the cecum, as on yesterday 's exam. Left paracentral loop of small bowel measuring 3.4 cm in diameter (formerly 4.0 cm in diameter) with air-fluid levels at differing vertical levels within the same loop. Several other central abdominal loops of small bowel demonstrating air-fluid  levels. IMPRESSION: 1. Abnormal mildly dilated loops of small bowel in the central and left abdomen, less dilated than yesterday, but with air- fluid levels at differing vertical levels in the same loop raising the possibility of small bowel obstruction. 2. Lack of progression of contrast medium in the cecum. Electronically Signed   By: Van Clines M.D.   On: 08/31/2015 09:33   Dg Abd 2 Views  08/30/2015  CLINICAL DATA:  75 year old male with abdominal pain worse in the left abdomen. No bowel movements. Recent small bowel obstruction with left upper quadrant transition on CT Abdomen and Pelvis. Initial encounter. EXAM: ABDOMEN - 2 VIEW COMPARISON:  08/29/2015 and earlier FINDINGS: Enteric tube removed. Continued dilated gas-filled small bowel loops in the left abdomen approaching 4 cm diameter. No pneumoperitoneum and negative visualized lung bases on the upright view. There is some retained oral contrast mixed with stool in the right colon from the recent CT Abdomen and Pelvis, indicating an incomplete obstruction. There is excreted IV contrast in the urinary bladder which is positive for a right side bladder diverticulum. No acute osseous abnormality identified. IMPRESSION: 1. Continued dilated small bowel in the left abdomen with constellation most compatible with unresolved partial small bowel obstruction. No free air. 2. Enteric tube removed. 3. Excreted IV contrast in the urinary bladder with right side bladder diverticulum. Electronically Signed   By: Genevie Ann M.D.   On: 08/30/2015 16:15   Dg Abd Portable  1v-small Bowel Obstruction Protocol-initial, 8 Hr Delay  08/29/2015  CLINICAL DATA:  Follow-up bowel obstruction. Persistent periumbilical pain. EXAM: PORTABLE ABDOMEN - 1 VIEW COMPARISON:  Abdominal radiographs August 29, 2015 at 0809 hours FINDINGS: Contrast in the urinary bladder. Bowel gas pattern is nondilated and nonobstructive. Nasogastric tube projects in proximal stomach with side port at  the level of the gastroesophageal junction. Surgical staples project in LEFT upper quadrant. No intra-abdominal mass effect or pathologic calcifications. Soft tissue planes and included osseous structure nonsuspicious. IMPRESSION: Normal bowel gas pattern. Nasal gastric tube side port now projects at gastroesophageal junction. Electronically Signed   By: Elon Alas M.D.   On: 08/29/2015 18:15   Personally viewed.   Telemetry: No adverse arrhythmias Personally viewed.   EKG:  As below.  Personally viewed.  Cardiac Studies:  45% EF. Repeat ECHO P  Meds: Scheduled Meds: . antiseptic oral rinse  7 mL Mouth Rinse BID  . feeding supplement  1 Container Oral TID BM  . hydrALAZINE  5 mg Intravenous QID  . metoprolol  10 mg Intravenous Q8H  . nitroGLYCERIN  1 inch Topical Q6H  . sodium chloride flush  3 mL Intravenous Q12H   Continuous Infusions: . heparin 800 Units/hr (08/30/15 1526)   PRN Meds:.acetaminophen **OR** acetaminophen, hydrALAZINE, HYDROmorphone (DILAUDID) injection, ondansetron (ZOFRAN) IV  Assessment/Plan:  Principal Problem:   SBO (small bowel obstruction) ([facility]) Active Problems:   Chronic systolic (congestive) heart failure ([facility])   Hypertensive urgency   CKD (chronic kidney disease) stage 3, GFR 30-59 ml/min   H/O mitral valve replacement with mechanical valve   Long term (current) use of anticoagulants [Z79.01]   BPH (benign prostatic hyperplasia)   Elevated troponin   Pancreatitis   Protein-calorie malnutrition, severe ([facility])   HLD (hyperlipidemia)    1. Elevated troponin -  Trending down. 0.04-->0.09-->0.23-->0.31-->0.37->0.16. The patient is asymptomatic. 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 He has new TWI inferior laterally on most recent EKG. Agree with repeat echo. Repeat EKG with continued TWI but not as severe.  For now, just had cardiac cath which showed non obstructive disease (med mgt. )  2. Hypertensive urgency - BP of 227/102  on presentation. On IV hydralazine and IV metoprolol.  Improved. Likely precipitating troponin elevation as well.   3. Sinus arrhythmias - Review of tele showed paroxysmal atrial tachycardia.  No further tx.    4. Chronic systolic (congestive) heart failure ([place]) - Recent echo 04/2015 showed EF of 45-50%. He is euvolemic. BNP of 816. Compliant with diet and salt restriction. Watch sign of volume overload when holding lasix. Pending echo.   5. CKD (chronic kidney disease) stage 3, GFR 30-59 ml/min - Slight worsening, may actually need gentle fluids. Off lasix.   6. H/O mitral valve replacement with mechanical valve - Coumadin to heparin per pharmacy for possible surgery.   7. Prolonged QT - Avoid QT prolonging agent.    Candee Furbish 08/31/2015, 11:43 AM

## 2015-08-31 NOTE — Progress Notes (Signed)
ANTICOAGULATION CONSULT NOTE - Follow Up Consult  Pharmacy Consult for Heparin Indication: MVR  Allergies  Allergen Reactions  . Penicillins Other (See Comments)  . Vancomycin Other (See Comments)    Kidney problems    Patient Measurements: Height: 5\' 6"  (167.6 cm) Weight: 117 lb 15.1 oz (53.5 kg) IBW/kg (Calculated) : 63.8 Heparin Dosing Weight: 53.5 kg  Vital Signs: Temp: 97.4 F (36.3 C) (06/20 0728) Temp Source: Oral (06/20 0728) BP: 128/58 mmHg (06/20 0955) Pulse Rate: 74 (06/20 0955)  Labs:  Recent Labs  08/29/15 0414  08/29/15 1504 08/29/15 2021 08/30/15 0025 08/30/15 0854 08/30/15 2003 08/31/15 0504 08/31/15 0508  HGB 13.4  --   --   --  13.3  --   --   --  12.7*  HCT 41.9  --   --   --  41.4  --   --   --  39.9  PLT 206  --   --   --  203  --   --   --  198  APTT 35  --   --   --   --   --   --   --   --   LABPROT 26.0*  --   --   --  24.9*  --   --  31.3*  --   INR 2.42*  --   --   --  2.27*  --   --  3.10*  --   HEPARINUNFRC  --   < > 0.52  --  0.69 0.76* 0.56 0.30  --   CREATININE 1.50*  --   --   --  1.80*  --   --   --  2.24*  TROPONINI 0.09*  < > 0.31* 0.37*  --   --   --   --  0.16*  < > = values in this interval not displayed.  Estimated Creatinine Clearance: 21.2 mL/min (by C-G formula based on Cr of 2.24).  Assessment:  Coumadin for hx MVR on hold due to SBO.  INR goal 2.5-3.5, last dose 6/16 prior to admission.  Heparin drip begun on 6/18 when INR down to 2.42.   INR down to 2.17 yesterday, but up to 3.10 today, despite no Coumadin.  Heparin level is low therapeutic at 0.30.  CBC okay.   Goal of Therapy:  Heparin level 0.3-0.7 units/ml Monitor platelets by anticoagulation protocol: Yes   Plan:   Continue heparin drip at 800 units/hr.  Daily heparin level, CBC and PT/INR.  Will follow up plans.   Arty Baumgartner, [place] Pager: [phone] 08/31/2015,12:42 PM

## 2015-08-31 NOTE — Progress Notes (Signed)
PT Cancellation Note  Patient Details Name: Robert [name] MRN: [id number] DOB: 10-19-1940   Cancelled Treatment:    Reason Eval/Treat Not Completed: Other (comment)   Politely declining walking; Waiting on Surgery recommendations and plan; Has walked the hallways yesterday with his wife;   Will follow up later today as time allows;  Otherwise, will follow up for PT tomorrow;   Thank you,  Roney Marion, PT  Acute Rehabilitation Services Pager [phone] Office [phone]     Roney Marion [facility] 08/31/2015, 1:39 PM

## 2015-08-31 NOTE — Progress Notes (Signed)
Patient coughing and spitting out foul smell sputum. NP on call notified, 5mg  of Tussonex given as ordered

## 2015-08-31 NOTE — Progress Notes (Signed)
Patient ID: Robert [name], male   DOB: 1940/12/17, 75 y.o.   MRN: [id number]     [address] [place], [place] [address]    Phone: [phone] FAX: [phone]     Subjective: vomiting with clears.  No flatus. No bm.   Objective:  Vital signs:  Filed Vitals:   08/30/15 2134 08/31/15 0451 08/31/15 0552 08/31/15 0728  BP: 121/74 110/59 118/63 118/59  Pulse: 75 77 79 71  Temp: 98.5 F (36.9 C) 98.3 F (36.8 C)  97.4 F (36.3 C)  TempSrc: Oral Oral  Oral  Resp: 18 18  16   Height:      Weight:      SpO2: 96% 91%  93%    Last BM Date: 08/29/15  Intake/Output   Yesterday:  06/19 0701 - 06/20 0700 In: 547.8 [P.O.:360; I.V.:187.8] Out: 275 [Urine:275] This shift:    I/O last 3 completed shifts: In: 785.9 [P.O.:440; I.V.:345.9] Out: 275 [Urine:275]    Physical Exam: General: Pt awake/alert/oriented x4 in no acute distress  Abdomen: Soft.  Nondistended.  Mild ttp rlq and luq.  No evidence of peritonitis.  No incarcerated hernias.    Problem List:   Principal Problem:   SBO (small bowel obstruction) (HCC) Active Problems:   Chronic systolic (congestive) heart failure (HCC)   Hypertensive urgency   CKD (chronic kidney disease) stage 3, GFR 30-59 ml/min   H/O mitral valve replacement with mechanical valve   Long term (current) use of anticoagulants [Z79.01]   BPH (benign prostatic hyperplasia)   Elevated troponin   Pancreatitis   Protein-calorie malnutrition, severe (HCC)   HLD (hyperlipidemia)    Results:   Labs: Results for orders placed or performed during the hospital encounter of 08/28/15 (from the past 57 hour(s))  Troponin I (q 6hr x 3)     Status: Abnormal   Collection Time: 08/29/15  9:14 AM  Result Value Ref Range   Troponin I 0.23 (H) <0.031 ng/mL    Comment:        PERSISTENTLY INCREASED TROPONIN VALUES IN THE RANGE OF 0.04-0.49 ng/mL CAN BE SEEN IN:       -UNSTABLE  ANGINA       -CONGESTIVE HEART FAILURE       -MYOCARDITIS       -CHEST TRAUMA       -ARRYHTHMIAS       -LATE PRESENTING MYOCARDIAL INFARCTION       -COPD   CLINICAL FOLLOW-UP RECOMMENDED.   Lipase, blood     Status: Abnormal   Collection Time: 08/29/15  9:14 AM  Result Value Ref Range   Lipase 112 (H) 11 - 51 U/L  Glucose, capillary     Status: Abnormal   Collection Time: 08/29/15 12:00 PM  Result Value Ref Range   Glucose-Capillary 136 (H) 65 - 99 mg/dL   Comment 1 Notify RN    Comment 2 Document in Chart   Troponin I (q 6hr x 3)     Status: Abnormal   Collection Time: 08/29/15  3:04 PM  Result Value Ref Range   Troponin I 0.31 (H) <0.031 ng/mL    Comment:        PERSISTENTLY INCREASED TROPONIN VALUES IN THE RANGE OF 0.04-0.49 ng/mL CAN BE SEEN IN:       -UNSTABLE ANGINA       -CONGESTIVE HEART FAILURE       -MYOCARDITIS       -  CHEST TRAUMA       -ARRYHTHMIAS       -LATE PRESENTING MYOCARDIAL INFARCTION       -COPD   CLINICAL FOLLOW-UP RECOMMENDED.   Heparin level (unfractionated)     Status: None   Collection Time: 08/29/15  3:04 PM  Result Value Ref Range   Heparin Unfractionated 0.52 0.30 - 0.70 IU/mL    Comment:        IF HEPARIN RESULTS ARE BELOW EXPECTED VALUES, AND PATIENT DOSAGE HAS BEEN CONFIRMED, SUGGEST FOLLOW UP TESTING OF ANTITHROMBIN III LEVELS.   Glucose, capillary     Status: Abnormal   Collection Time: 08/29/15  4:04 PM  Result Value Ref Range   Glucose-Capillary 127 (H) 65 - 99 mg/dL   Comment 1 Notify RN    Comment 2 Document in Chart   Troponin I     Status: Abnormal   Collection Time: 08/29/15  8:21 PM  Result Value Ref Range   Troponin I 0.37 (H) <0.031 ng/mL    Comment:        PERSISTENTLY INCREASED TROPONIN VALUES IN THE RANGE OF 0.04-0.49 ng/mL CAN BE SEEN IN:       -UNSTABLE ANGINA       -CONGESTIVE HEART FAILURE       -MYOCARDITIS       -CHEST TRAUMA       -ARRYHTHMIAS       -LATE PRESENTING MYOCARDIAL INFARCTION        -COPD   CLINICAL FOLLOW-UP RECOMMENDED.   Glucose, capillary     Status: Abnormal   Collection Time: 08/29/15 10:10 PM  Result Value Ref Range   Glucose-Capillary 115 (H) 65 - 99 mg/dL  Heparin level (unfractionated)     Status: None   Collection Time: 08/30/15 12:25 AM  Result Value Ref Range   Heparin Unfractionated 0.69 0.30 - 0.70 IU/mL    Comment:        IF HEPARIN RESULTS ARE BELOW EXPECTED VALUES, AND PATIENT DOSAGE HAS BEEN CONFIRMED, SUGGEST FOLLOW UP TESTING OF ANTITHROMBIN III LEVELS.   Comprehensive metabolic panel     Status: Abnormal   Collection Time: 08/30/15 12:25 AM  Result Value Ref Range   Sodium 140 135 - 145 mmol/L   Potassium 4.0 3.5 - 5.1 mmol/L   Chloride 103 101 - 111 mmol/L   CO2 28 22 - 32 mmol/L   Glucose, Bld 147 (H) 65 - 99 mg/dL   BUN 34 (H) 6 - 20 mg/dL   Creatinine, Ser 1.80 (H) 0.61 - 1.24 mg/dL   Calcium 8.4 (L) 8.9 - 10.3 mg/dL   Total Protein 7.4 6.5 - 8.1 g/dL   Albumin 3.4 (L) 3.5 - 5.0 g/dL   AST 35 15 - 41 U/L   ALT 22 17 - 63 U/L   Alkaline Phosphatase 67 38 - 126 U/L   Total Bilirubin 1.4 (H) 0.3 - 1.2 mg/dL   GFR calc non Af Amer 35 (L) >60 mL/min   GFR calc Af Amer 40 (L) >60 mL/min    Comment: (NOTE) The eGFR has been calculated using the CKD EPI equation. This calculation has not been validated in all clinical situations. eGFR's persistently <60 mL/min signify possible Chronic Kidney Disease.    Anion gap 9 5 - 15  CBC     Status: None   Collection Time: 08/30/15 12:25 AM  Result Value Ref Range   WBC 8.9 4.0 - 10.5 K/uL   RBC 4.56 4.22 - 5.81 MIL/uL  Hemoglobin 13.3 13.0 - 17.0 g/dL   HCT 41.4 39.0 - 52.0 %   MCV 90.8 78.0 - 100.0 fL   MCH 29.2 26.0 - 34.0 pg   MCHC 32.1 30.0 - 36.0 g/dL   RDW 14.3 11.5 - 15.5 %   Platelets 203 150 - 400 K/uL  Lipase, blood     Status: None   Collection Time: 08/30/15 12:25 AM  Result Value Ref Range   Lipase 22 11 - 51 U/L  Amylase     Status: Abnormal   Collection Time:  08/30/15 12:25 AM  Result Value Ref Range   Amylase 133 (H) 28 - 100 U/L  Protime-INR     Status: Abnormal   Collection Time: 08/30/15 12:25 AM  Result Value Ref Range   Prothrombin Time 24.9 (H) 11.6 - 15.2 seconds   INR 2.27 (H) 0.00 - 1.49  Glucose, capillary     Status: Abnormal   Collection Time: 08/30/15  8:26 AM  Result Value Ref Range   Glucose-Capillary 112 (H) 65 - 99 mg/dL  Heparin level (unfractionated)     Status: Abnormal   Collection Time: 08/30/15  8:54 AM  Result Value Ref Range   Heparin Unfractionated 0.76 (H) 0.30 - 0.70 IU/mL    Comment:        IF HEPARIN RESULTS ARE BELOW EXPECTED VALUES, AND PATIENT DOSAGE HAS BEEN CONFIRMED, SUGGEST FOLLOW UP TESTING OF ANTITHROMBIN III LEVELS.   Heparin level (unfractionated)     Status: None   Collection Time: 08/30/15  8:03 PM  Result Value Ref Range   Heparin Unfractionated 0.56 0.30 - 0.70 IU/mL    Comment:        IF HEPARIN RESULTS ARE BELOW EXPECTED VALUES, AND PATIENT DOSAGE HAS BEEN CONFIRMED, SUGGEST FOLLOW UP TESTING OF ANTITHROMBIN III LEVELS.   Heparin level (unfractionated)     Status: None   Collection Time: 08/31/15  5:04 AM  Result Value Ref Range   Heparin Unfractionated 0.30 0.30 - 0.70 IU/mL    Comment:        IF HEPARIN RESULTS ARE BELOW EXPECTED VALUES, AND PATIENT DOSAGE HAS BEEN CONFIRMED, SUGGEST FOLLOW UP TESTING OF ANTITHROMBIN III LEVELS.   Protime-INR     Status: Abnormal   Collection Time: 08/31/15  5:04 AM  Result Value Ref Range   Prothrombin Time 31.3 (H) 11.6 - 15.2 seconds   INR 3.10 (H) 0.00 - 1.49  CBC     Status: Abnormal   Collection Time: 08/31/15  5:08 AM  Result Value Ref Range   WBC 6.7 4.0 - 10.5 K/uL   RBC 4.36 4.22 - 5.81 MIL/uL   Hemoglobin 12.7 (L) 13.0 - 17.0 g/dL   HCT 39.9 39.0 - 52.0 %   MCV 91.5 78.0 - 100.0 fL   MCH 29.1 26.0 - 34.0 pg   MCHC 31.8 30.0 - 36.0 g/dL   RDW 14.3 11.5 - 15.5 %   Platelets 198 150 - 400 K/uL  Troponin I     Status:  Abnormal   Collection Time: 08/31/15  5:08 AM  Result Value Ref Range   Troponin I 0.16 (H) <0.031 ng/mL    Comment:        PERSISTENTLY INCREASED TROPONIN VALUES IN THE RANGE OF 0.04-0.49 ng/mL CAN BE SEEN IN:       -UNSTABLE ANGINA       -CONGESTIVE HEART FAILURE       -MYOCARDITIS       -CHEST TRAUMA       -  ARRYHTHMIAS       -LATE PRESENTING MYOCARDIAL INFARCTION       -COPD   CLINICAL FOLLOW-UP RECOMMENDED.   Lipase, blood     Status: None   Collection Time: 08/31/15  5:08 AM  Result Value Ref Range   Lipase 36 11 - 51 U/L  Amylase     Status: Abnormal   Collection Time: 08/31/15  5:08 AM  Result Value Ref Range   Amylase 183 (H) 28 - 100 U/L  Comprehensive metabolic panel     Status: Abnormal   Collection Time: 08/31/15  5:08 AM  Result Value Ref Range   Sodium 141 135 - 145 mmol/L   Potassium 3.7 3.5 - 5.1 mmol/L   Chloride 103 101 - 111 mmol/L   CO2 27 22 - 32 mmol/L   Glucose, Bld 139 (H) 65 - 99 mg/dL   BUN 44 (H) 6 - 20 mg/dL   Creatinine, Ser 2.24 (H) 0.61 - 1.24 mg/dL   Calcium 8.5 (L) 8.9 - 10.3 mg/dL   Total Protein 7.1 6.5 - 8.1 g/dL   Albumin 3.2 (L) 3.5 - 5.0 g/dL   AST 37 15 - 41 U/L   ALT 23 17 - 63 U/L   Alkaline Phosphatase 56 38 - 126 U/L   Total Bilirubin 1.7 (H) 0.3 - 1.2 mg/dL   GFR calc non Af Amer 27 (L) >60 mL/min   GFR calc Af Amer 31 (L) >60 mL/min    Comment: (NOTE) The eGFR has been calculated using the CKD EPI equation. This calculation has not been validated in all clinical situations. eGFR's persistently <60 mL/min signify possible Chronic Kidney Disease.    Anion gap 11 5 - 15  Glucose, capillary     Status: Abnormal   Collection Time: 08/31/15  7:26 AM  Result Value Ref Range   Glucose-Capillary 139 (H) 65 - 99 mg/dL    Imaging / Studies: Dg Abd 2 Views  08/30/2015  CLINICAL DATA:  75 year old male with abdominal pain worse in the left abdomen. No bowel movements. Recent small bowel obstruction with left upper quadrant  transition on CT Abdomen and Pelvis. Initial encounter. EXAM: ABDOMEN - 2 VIEW COMPARISON:  08/29/2015 and earlier FINDINGS: Enteric tube removed. Continued dilated gas-filled small bowel loops in the left abdomen approaching 4 cm diameter. No pneumoperitoneum and negative visualized lung bases on the upright view. There is some retained oral contrast mixed with stool in the right colon from the recent CT Abdomen and Pelvis, indicating an incomplete obstruction. There is excreted IV contrast in the urinary bladder which is positive for a right side bladder diverticulum. No acute osseous abnormality identified. IMPRESSION: 1. Continued dilated small bowel in the left abdomen with constellation most compatible with unresolved partial small bowel obstruction. No free air. 2. Enteric tube removed. 3. Excreted IV contrast in the urinary bladder with right side bladder diverticulum. Electronically Signed   By: Genevie Ann M.D.   On: 08/30/2015 16:15   Dg Abd Portable 1v-small Bowel Obstruction Protocol-initial, 8 Hr Delay  08/29/2015  CLINICAL DATA:  Follow-up bowel obstruction. Persistent periumbilical pain. EXAM: PORTABLE ABDOMEN - 1 VIEW COMPARISON:  Abdominal radiographs August 29, 2015 at 0809 hours FINDINGS: Contrast in the urinary bladder. Bowel gas pattern is nondilated and nonobstructive. Nasogastric tube projects in proximal stomach with side port at the level of the gastroesophageal junction. Surgical staples project in LEFT upper quadrant. No intra-abdominal mass effect or pathologic calcifications. Soft tissue planes and included osseous  structure nonsuspicious. IMPRESSION: Normal bowel gas pattern. Nasal gastric tube side port now projects at gastroesophageal junction. Electronically Signed   By: Elon Alas M.D.   On: 08/29/2015 18:15    Medications / Allergies:  Scheduled Meds: . antiseptic oral rinse  7 mL Mouth Rinse BID  . feeding supplement  1 Container Oral TID BM  . hydrALAZINE  5 mg  Intravenous QID  . metoprolol  10 mg Intravenous Q8H  . nitroGLYCERIN  1 inch Topical Q6H  . sodium chloride flush  3 mL Intravenous Q12H   Continuous Infusions: . heparin 800 Units/hr (08/30/15 1526)   PRN Meds:.acetaminophen **OR** acetaminophen, hydrALAZINE, HYDROmorphone (DILAUDID) injection, ondansetron (ZOFRAN) IV  Antibiotics: Anti-infectives    None        Assessment/Plan HD#3 pSBO-still appears at least partially obstructed.  Will obtain AXR now.  Keep NPO. Further recs to follow.  Low threshold for TPN.  May require surgery.  Hx mitral valve replacement with mechanical valve-INR is 3.1 today.  On heparin drip.  Continue hold coumadin.  CKD, stage III-sCr worsened CHF-cards following FEN-back to NPO, IVF Dispo-AXR  Erby Pian, ANP-BC [facility] Surgery Pager [phone]) For consults and floor pages call [phone](7A-4:30P)  08/31/2015

## 2015-09-01 ENCOUNTER — Inpatient Hospital Stay (HOSPITAL_COMMUNITY): Payer: BC Managed Care – PPO

## 2015-09-01 DIAGNOSIS — N179 Acute kidney failure, unspecified: Secondary | ICD-10-CM | POA: Insufficient documentation

## 2015-09-01 DIAGNOSIS — I5023 Acute on chronic systolic (congestive) heart failure: Secondary | ICD-10-CM

## 2015-09-01 DIAGNOSIS — Z954 Presence of other heart-valve replacement: Secondary | ICD-10-CM

## 2015-09-01 DIAGNOSIS — E43 Unspecified severe protein-calorie malnutrition: Secondary | ICD-10-CM

## 2015-09-01 LAB — ECHOCARDIOGRAM COMPLETE
AVLVOTPG: 2 mmHg
AVPHT: 569 ms
CHL CUP DOP CALC LVOT VTI: 18.5 cm
CHL CUP MV DEC (S): 317
CHL CUP TV REG PEAK VELOCITY: 310 cm/s
E decel time: 317 msec
FS: 31 % (ref 28–44)
Height: 66 in
IV/PV OW: 1.34
LA diam index: 3.5 cm/m2
LA vol index: 63.2 mL/m2
LA vol: 99.3 mL
LASIZE: 55 mm
LAVOLA4C: 64.9 mL
LDCA: 3.14 cm2
LEFT ATRIUM END SYS DIAM: 55 mm
LVOT MV VTI INDEX: 0.73 cm2/m2
LVOT MV VTI: 1.14
LVOT SV: 58 mL
LVOT diameter: 20 mm
LVOT peak vel: 70.9 cm/s
MV Annulus VTI: 51 cm
MV M vel: 85.9
MVAP: 1.98 cm2
MVG: 3 mmHg
MVPG: 7 mmHg
MVPKAVEL: 124 m/s
MVPKEVEL: 136 m/s
P 1/2 time: 111 ms
PW: 12.1 mm — AB (ref 0.6–1.1)
TRMAXVEL: 310 cm/s
Weight: 1887.14 oz

## 2015-09-01 LAB — COMPREHENSIVE METABOLIC PANEL
ALK PHOS: 50 U/L (ref 38–126)
ALT: 22 U/L (ref 17–63)
ANION GAP: 12 (ref 5–15)
AST: 32 U/L (ref 15–41)
Albumin: 3 g/dL — ABNORMAL LOW (ref 3.5–5.0)
BILIRUBIN TOTAL: 2.3 mg/dL — AB (ref 0.3–1.2)
BUN: 73 mg/dL — ABNORMAL HIGH (ref 6–20)
CALCIUM: 8.4 mg/dL — AB (ref 8.9–10.3)
CO2: 26 mmol/L (ref 22–32)
Chloride: 104 mmol/L (ref 101–111)
Creatinine, Ser: 2.49 mg/dL — ABNORMAL HIGH (ref 0.61–1.24)
GFR calc non Af Amer: 24 mL/min — ABNORMAL LOW (ref >60)
GFR, EST AFRICAN AMERICAN: 27 mL/min — AB (ref >60)
Glucose, Bld: 121 mg/dL — ABNORMAL HIGH (ref 65–99)
Potassium: 3.4 mmol/L — ABNORMAL LOW (ref 3.5–5.1)
Sodium: 142 mmol/L (ref 135–145)
TOTAL PROTEIN: 6.7 g/dL (ref 6.5–8.1)

## 2015-09-01 LAB — PROTIME-INR
INR: 3.17 — ABNORMAL HIGH (ref 0.00–1.49)
PROTHROMBIN TIME: 31.9 s — AB (ref 11.6–15.2)

## 2015-09-01 LAB — CBC
HEMATOCRIT: 36.8 % — AB (ref 39.0–52.0)
HEMOGLOBIN: 11.6 g/dL — AB (ref 13.0–17.0)
MCH: 28.3 pg (ref 26.0–34.0)
MCHC: 31.5 g/dL (ref 30.0–36.0)
MCV: 89.8 fL (ref 78.0–100.0)
Platelets: 183 10*3/uL (ref 150–400)
RBC: 4.1 MIL/uL — ABNORMAL LOW (ref 4.22–5.81)
RDW: 14.6 % (ref 11.5–15.5)
WBC: 10.1 10*3/uL (ref 4.0–10.5)

## 2015-09-01 LAB — DIFFERENTIAL
BASOS ABS: 0 10*3/uL (ref 0.0–0.1)
BASOS PCT: 0 %
Eosinophils Absolute: 0 10*3/uL (ref 0.0–0.7)
Eosinophils Relative: 0 %
Lymphocytes Relative: 10 %
Lymphs Abs: 1 10*3/uL (ref 0.7–4.0)
Monocytes Absolute: 0.7 10*3/uL (ref 0.1–1.0)
Monocytes Relative: 7 %
NEUTROS ABS: 8.3 10*3/uL — AB (ref 1.7–7.7)
Neutrophils Relative %: 83 %

## 2015-09-01 LAB — TRIGLYCERIDES: TRIGLYCERIDES: 102 mg/dL (ref <150)

## 2015-09-01 LAB — HEPARIN LEVEL (UNFRACTIONATED): Heparin Unfractionated: 0.2 IU/mL — ABNORMAL LOW (ref 0.30–0.70)

## 2015-09-01 LAB — MAGNESIUM: MAGNESIUM: 2.4 mg/dL (ref 1.7–2.4)

## 2015-09-01 LAB — PREALBUMIN: Prealbumin: 13.3 mg/dL — ABNORMAL LOW (ref 18–38)

## 2015-09-01 LAB — GLUCOSE, CAPILLARY
Glucose-Capillary: 114 mg/dL — ABNORMAL HIGH (ref 65–99)
Glucose-Capillary: 141 mg/dL — ABNORMAL HIGH (ref 65–99)
Glucose-Capillary: 181 mg/dL — ABNORMAL HIGH (ref 65–99)

## 2015-09-01 LAB — PHOSPHORUS: PHOSPHORUS: 4 mg/dL (ref 2.5–4.6)

## 2015-09-01 MED ORDER — TRACE MINERALS CR-CU-MN-SE-ZN 10-1000-500-60 MCG/ML IV SOLN: INTRAVENOUS | Status: DC

## 2015-09-01 MED ORDER — GLYCERIN (LAXATIVE) 2.1 G RE SUPP
1.0000 | Freq: Once | RECTAL | Status: AC
  Administered 2015-09-01: 1 via RECTAL
  Filled 2015-09-01: qty 1

## 2015-09-01 MED ORDER — MENTHOL 3 MG MT LOZG: 1.0000 | LOZENGE | OROMUCOSAL | Status: DC | PRN

## 2015-09-01 MED ORDER — INSULIN ASPART 100 UNIT/ML ~~LOC~~ SOLN
0.0000 [IU] | Freq: Four times a day (QID) | SUBCUTANEOUS | Status: DC
  Administered 2015-09-01: 2 [IU] via SUBCUTANEOUS
  Administered 2015-09-01 – 2015-09-02 (×3): 1 [IU] via SUBCUTANEOUS
  Administered 2015-09-02 – 2015-09-03 (×3): 2 [IU] via SUBCUTANEOUS
  Administered 2015-09-03: 1 [IU] via SUBCUTANEOUS

## 2015-09-01 MED ORDER — METOPROLOL TARTRATE 5 MG/5ML IV SOLN
5.0000 mg | Freq: Three times a day (TID) | INTRAVENOUS | Status: DC
  Administered 2015-09-03: 5 mg via INTRAVENOUS
  Filled 2015-09-01 (×4): qty 5

## 2015-09-01 MED ORDER — KCL IN DEXTROSE-NACL 20-5-0.45 MEQ/L-%-% IV SOLN
INTRAVENOUS | Status: DC
  Administered 2015-09-01 – 2015-09-02 (×2): via INTRAVENOUS
  Filled 2015-09-01 (×7): qty 1000

## 2015-09-01 MED ORDER — TRACE MINERALS CR-CU-MN-SE-ZN 10-1000-500-60 MCG/ML IV SOLN
INTRAVENOUS | Status: DC
  Filled 2015-09-01: qty 840

## 2015-09-01 MED ORDER — POTASSIUM CHLORIDE 10 MEQ/100ML IV SOLN: 10.0000 meq | INTRAVENOUS | Status: DC

## 2015-09-01 MED ORDER — FAT EMULSION 20 % IV EMUL: 240.0000 mL | INTRAVENOUS | Status: DC

## 2015-09-01 MED ORDER — FAMOTIDINE IN NACL 20-0.9 MG/50ML-% IV SOLN
20.0000 mg | Freq: Two times a day (BID) | INTRAVENOUS | Status: DC
  Administered 2015-09-01 – 2015-09-02 (×3): 20 mg via INTRAVENOUS
  Filled 2015-09-01 (×4): qty 50

## 2015-09-01 MED ORDER — FAT EMULSION 20 % IV EMUL
240.0000 mL | INTRAVENOUS | Status: DC
  Filled 2015-09-01: qty 250

## 2015-09-01 NOTE — Progress Notes (Addendum)
Cardiologist: Dr. Debara Pickett  Subjective:  Bowel obstruction. No CP. Feels a little better today.  Objective:  Vital Signs in the last 24 hours: Temp:  [97.6 F (36.4 C)-98.3 F (36.8 C)] 97.9 F (36.6 C) (06/21 0926) Pulse Rate:  [57-72] 60 (06/21 0926) Resp:  [17-20] 17 (06/21 0926) BP: (118-156)/(52-68) 146/62 mmHg (06/21 0926) SpO2:  [95 %-98 %] 95 % (06/21 0926)  Intake/Output from previous day: 06/20 0701 - 06/21 0700 In: 2313.3 [P.O.:2120; I.V.:193.3] Out: 603 [Urine:555; Emesis/NG output:48]   Physical Exam: General: Well developed, well nourished, in no acute distress. Head:  Normocephalic and atraumatic. Lungs: Clear to auscultation and percussion. Heart: Click S1 and normal S2.  No murmur, rubs or gallops.  Abdomen: hypoactive bowel sounds. Extremities: No clubbing or cyanosis. No edema. Neurologic: Alert and oriented x 3.    Lab Results:  Recent Labs  08/31/15 0508 09/01/15 0515  WBC 6.7 10.1  HGB 12.7* 11.6*  PLT 198 183    Recent Labs  08/31/15 0508 09/01/15 0515  NA 141 142  K 3.7 3.4*  CL 103 104  CO2 27 26  GLUCOSE 139* 121*  BUN 44* 73*  CREATININE 2.24* 2.49*    Recent Labs  08/29/15 2021 08/31/15 0508  TROPONINI 0.37* 0.16*   Hepatic Function Panel  Recent Labs  09/01/15 0515  PROT 6.7  ALBUMIN 3.0*  AST 32  ALT 22  ALKPHOS 50  BILITOT 2.3*    Imaging: US Renal  09/01/2015  CLINICAL DATA:  Acute kidney insufficiency, chronic kidney disease EXAM: RENAL / URINARY TRACT ULTRASOUND COMPLETE COMPARISON:  Ultrasound of the kidneys 09/29/2014 and CT scan 08/29/2015 FINDINGS: Right Kidney: Length: 10.3 cm. Echogenicity within normal limits. No mass or hydronephrosis visualized. There is a mid to lower pole cyst measures 7 x 6 mm. Left Kidney: Length: 9.8 cm in length. Echogenicity within normal limits. No mass or hydronephrosis visualized. There is a lower pole cyst measures 9 x 9 mm. Bladder: A urinary bladder diverticulum is  noted to the right site measures 2.6 x 2.4 cm. Otherwise the urinary bladder is unremarkable. IMPRESSION: 1. No hydronephrosis. No renal calculi. Bilateral small renal cysts. 2. 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 Electronically Signed   By: Lahoma Crocker M.D.   On: 09/01/2015 09:39   Dg Abd 2 Views  08/31/2015  CLINICAL DATA:  Vomiting EXAM: ABDOMEN - 2 VIEW COMPARISON:  08/30/2015 FINDINGS: Mitral valve prosthesis. Faint interstitial accentuation in the lung bases, right greater than left. Residual contrast medium in the cecum, as on yesterday 's exam. Left paracentral loop of small bowel measuring 3.4 cm in diameter (formerly 4.0 cm in diameter) with air-fluid levels at differing vertical levels within the same loop. Several other central abdominal loops of small bowel demonstrating air-fluid levels. IMPRESSION: 1. Abnormal mildly dilated loops of small bowel in the central and left abdomen, less dilated than yesterday, but with air- fluid levels at differing vertical levels in the same loop raising the possibility of small bowel obstruction. 2. Lack of progression of contrast medium in the cecum. Electronically Signed   By: Van Clines M.D.   On: 08/31/2015 09:33   Dg Abd 2 Views  08/30/2015  CLINICAL DATA:  75 year old male with abdominal pain worse in the left abdomen. No bowel movements. Recent small bowel obstruction with left upper quadrant transition on CT Abdomen and Pelvis. Initial encounter. EXAM: ABDOMEN - 2 VIEW COMPARISON:  08/29/2015 and earlier FINDINGS: Enteric tube removed.  Continued dilated gas-filled small bowel loops in the left abdomen approaching 4 cm diameter. No pneumoperitoneum and negative visualized lung bases on the upright view. There is some retained oral contrast mixed with stool in the right colon from the recent CT Abdomen and Pelvis, indicating an incomplete obstruction. There is excreted IV contrast in the urinary bladder which is positive for a  right side bladder diverticulum. No acute osseous abnormality identified. IMPRESSION: 1. Continued dilated small bowel in the left abdomen with constellation most compatible with unresolved partial small bowel obstruction. No free air. 2. Enteric tube removed. 3. Excreted IV contrast in the urinary bladder with right side bladder diverticulum. Electronically Signed   By: Genevie Ann M.D.   On: 08/30/2015 16:15   Personally viewed.   Telemetry: No adverse arrhythmias Personally viewed.   EKG:  As below.  Personally viewed.  Cardiac Studies:  45% EF prior, now 55%, reassuring.   Meds: Scheduled Meds: . antiseptic oral rinse  7 mL Mouth Rinse BID  . feeding supplement  1 Container Oral TID BM  . insulin aspart  0-9 Units Subcutaneous Q6H  . metoprolol  5 mg Intravenous Q8H  . nitroGLYCERIN  1 inch Topical Q6H  . sodium chloride flush  3 mL Intravenous Q12H   Continuous Infusions: . dextrose 5 % and 0.45 % NaCl with KCl 20 mEq/L 125 mL/hr at 09/01/15 1056   PRN Meds:.acetaminophen **OR** acetaminophen, hydrALAZINE, HYDROmorphone (DILAUDID) injection, ondansetron (ZOFRAN) IV  Assessment/Plan:  Principal Problem:   SBO (small bowel obstruction) (HCC) Active Problems:   Chronic systolic (congestive) heart failure (HCC)   Hypertensive urgency   CKD (chronic kidney disease) stage 3, GFR 30-59 ml/min   H/O mitral valve replacement with mechanical valve   Long term (current) use of anticoagulants [Z79.01]   BPH (benign prostatic hyperplasia)   Elevated troponin   Pancreatitis   Protein-calorie malnutrition, severe (HCC)   HLD (hyperlipidemia)    1. Elevated troponin -  Trending down. 0.04-->0.09-->0.23-->0.31-->0.37->0.16. The patient is asymptomatic. 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 He has new TWI inferior laterally on most recent EKG. Agree with repeat echo. Repeat EKG with continued TWI but not as severe.  For now, just had cardiac cath which showed non  obstructive disease (med mgt.)  2. Hypertensive urgency - BP of 227/102 on presentation. On IV hydralazine and IV metoprolol.  Improved. Likely precipitating troponin elevation as well. Now stable.   3. Sinus arrhythmias - Review of tele showed paroxysmal atrial tachycardia.  No further tx.    4. Chronic systolic (congestive) heart failure ([place]) - ECHO on this admit done and shows EF 55%, improved. Mechanical valve functioning normally.  - Recent echo 04/2015 showed EF of 45-50%. He is euvolemic. BNP of 816. Compliant with diet and salt restriction. Doing well.    5. CKD (chronic kidney disease) stage 3, GFR 30-59 ml/min - Worsening. Off lasix.  Gentle fluids.   6. H/O mitral valve replacement with mechanical valve - Coumadin to heparin per pharmacy for potential surgery.   7. Prolonged QT - Avoid QT prolonging agent.   No further cardiac recs at this time. Please let us know if you need any further assistance.   Candee Furbish 09/01/2015, 2:20 PM

## 2015-09-01 NOTE — Progress Notes (Signed)
[facility] Progress Note  Patient: Robert [name] [id number]   PCP: Burman Freestone, MD DOB: Jul 12, 1940   DOA: 08/28/2015   DOS: 09/01/2015   Date of Service: the patient was seen and examined on 09/01/2015  Subjective: Patient denies nausea vomiting fever no chills. No cough no shortness of breath. Nutrition: Now on clear liquid diet and tolerating  Brief hospital course: Pt. 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 Currently further plan is to clamp the NG tube and allow clear liquid diet and monitor for improvement.  Assessment and Plan: 1. SBO (small bowel obstruction) (HCC) Currently conservative treatment. Repeat x-ray shows unresolved SBO. Patient does have bowel sounds and has been having a bowel movement as well. Tolerating clear liquid diet. Continue NG tube with clamping  Add IV Pepcid.  2. Splenic infarct. Elevated lipase concerning for pancreatitis. Incidental findings finding along with CT scan for SBO. Continue monitoring at present. Lipase improved.  3. Acute on chronic kidney disease stage III Patient's serum creatinine as well as BUN significantly elevated at this morning. Minimal urine output. I would start the patient on IV hydration. Monitor for improvement in renal function. Possibility of dehydration as well as hemodynamic instability as well as contrast-induced nephropathy as primary etiology.  4. Protein calorie malnutrition, severe. Patient has remained nothing by mouth for last 4 days. Today the patient is been able to tolerate clear liquid diet. If there is no further improvement in patient's nutritional intake starting 08/31/2015 patient may require TPN. IR consulted for the same to maintain the patient on the list. INR may have to be reversed for IR guided line  Procedure  5. Mechanical mitral valve. On chronic anticoagulation. INR elevated likely from poor oral intake and vitamin K deficiency. At present we will hold heparin. Resume heparin when the patient's INR drops below 2.5  6. Elevated troponin. Hypertensive urgency. Patient presented with significant elevated blood pressure as well as elevated troponin which is currently trending downwards. Echocardiogram today does not show any evidence of fall motion abnormality. Significant improvement in EF as well. No further ischemic workup planned from cardiology. Appreciate input.  Pain management: When necessary Tylenol, when necessary Dilaudid Activity: Consulted physical therapy Bowel regimen: last BM 09/01/2015 Diet: Liquid diet DVT Prophylaxis: on therapeutic anticoagulation.  Advance goals of care discussion: Partial code  Family Communication: family was present at bedside, at the time of interview. The pt provided permission to discuss medical plan with the family. Opportunity was given to ask question and all questions were answered satisfactorily.   Disposition:  Discharge to likely home. Expected discharge date: 09/05/2015, improvement in oral intake  Consultants: Gen. surgery, cardiology Procedures: ng tube insertion  Antibiotics: Anti-infectives    None        Intake/Output Summary (Last 24 hours) at 09/01/15 1931 Last data filed at 09/01/15 1907  Gross per 24 hour  Intake 2293.34 ml  Output    728 ml  Net 1565.34 ml   Filed Weights   08/29/15 0400 08/29/15 0526 08/29/15 2212  Weight: 56.3 kg (124 lb 1.9 oz) 55.4 kg (122 lb 2.2 oz) 53.5 kg (117 lb 15.1 oz)    Objective: Physical Exam: Filed Vitals:   09/01/15 0810 09/01/15 0926 09/01/15 1400 09/01/15 1634  BP: 118/57 146/62 146/57 147/54  Pulse: 57 60 55 58  Temp: 98.3 F (36.8  C) 97.9 F (36.6 C)  98.1 F (36.7 C)  TempSrc: Oral Oral  Oral  Resp: 18 17  18   Height:      Weight:      SpO2: 95% 95%   97%    General: Alert, Awake and Oriented to Time, Place and Person. Appear in moderate distress Eyes: PERRL, Conjunctiva normal ENT: Oral Mucosa clear moist. Neck: no JVD, no Abnormal Mass Or lumps Cardiovascular: S1 and S2 Present, aortic systolic Murmur, Respiratory: Bilateral Air entry equal and Decreased, Clear to Auscultation, no Crackles, no wheezes Abdomen: Bowel Sound present, Soft and no tenderness Skin: no redness, no Rash  Extremities: no Pedal edema, no calf tenderness Neurologic: Grossly no focal neuro deficit. Bilaterally Equal motor strength  Data Reviewed: CBC:  Recent Labs Lab 08/28/15 2111 08/29/15 0414 08/30/15 0025 08/31/15 0508 09/01/15 0515  WBC 11.0* 11.1* 8.9 6.7 10.1  NEUTROABS  --   --   --   --  8.3*  HGB 13.6 13.4 13.3 12.7* 11.6*  HCT 41.8 41.9 41.4 39.9 36.8*  MCV 90.1 90.3 90.8 91.5 89.8  PLT 226 206 203 198 [id number]   Basic Metabolic Panel:  Recent Labs Lab 08/28/15 2111 08/29/15 0414 08/30/15 0025 08/31/15 0508 09/01/15 0515  NA 139 140 140 141 142  K 4.1 4.0 4.0 3.7 3.4*  CL 101 103 103 103 104  CO2 28 26 28 27 26   GLUCOSE 141* 133* 147* 139* 121*  BUN 25* 24* 34* 44* 73*  CREATININE 1.61* 1.50* 1.80* 2.24* 2.49*  CALCIUM 9.0 8.3* 8.4* 8.5* 8.4*  MG  --   --   --   --  2.4  PHOS  --   --   --   --  4.0    Liver Function Tests:  Recent Labs Lab 08/28/15 2111 08/29/15 0414 08/30/15 0025 08/31/15 0508 09/01/15 0515  AST 32 32 35 37 32  ALT 29 22 22 23 22   ALKPHOS 82 75 67 56 50  BILITOT 1.3* 1.4* 1.4* 1.7* 2.3*  PROT 8.1 7.1 7.4 7.1 6.7  ALBUMIN 4.0 3.4* 3.4* 3.2* 3.0*    Recent Labs Lab 08/28/15 2111 08/29/15 0053 08/29/15 0914 08/30/15 0025 08/31/15 0508  LIPASE 950*  --  112* 22 36  AMYLASE  --  979*  --  133* 183*   No results for input(s): AMMONIA in the last 168 hours. Coagulation Profile:  Recent Labs Lab 08/28/15 2111 08/29/15 0414 08/30/15 0025 08/31/15 0504 09/01/15 0515  INR 2.62* 2.42*  2.27* 3.10* 3.17*   Cardiac Enzymes:  Recent Labs Lab 08/29/15 0414 08/29/15 0914 08/29/15 1504 08/29/15 2021 08/31/15 0508  TROPONINI 0.09* 0.23* 0.31* 0.37* 0.16*   BNP (last 3 results) No results for input(s): PROBNP in the last 8760 hours.  CBG:  Recent Labs Lab 08/30/15 0826 08/31/15 0726 09/01/15 0808 09/01/15 1134 09/01/15 1631  GLUCAP 112* 139* 114* 141* 181*    Studies: US Renal  09/01/2015  CLINICAL DATA:  Acute kidney insufficiency, chronic kidney disease EXAM: RENAL / URINARY TRACT ULTRASOUND COMPLETE COMPARISON:  Ultrasound of the kidneys 09/29/2014 and CT scan 08/29/2015 FINDINGS: Right Kidney: Length: 10.3 cm. Echogenicity within normal limits. No mass or hydronephrosis visualized. There is a mid to lower pole cyst measures 7 x 6 mm. Left Kidney: Length: 9.8 cm in length. Echogenicity within normal limits. No mass or hydronephrosis visualized. There is a lower pole cyst measures 9 x 9 mm. Bladder: A urinary bladder diverticulum is noted to the right  site measures 2.6 x 2.4 cm. Otherwise the urinary bladder is unremarkable. IMPRESSION: 1. No hydronephrosis. No renal calculi. Bilateral small renal cysts. 2. 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 Electronically Signed   By: Lahoma Crocker M.D.   On: 09/01/2015 09:39     Scheduled Meds: . antiseptic oral rinse  7 mL Mouth Rinse BID  . feeding supplement  1 Container Oral TID BM  . insulin aspart  0-9 Units Subcutaneous Q6H  . metoprolol  5 mg Intravenous Q8H  . nitroGLYCERIN  1 inch Topical Q6H  . sodium chloride flush  3 mL Intravenous Q12H   Continuous Infusions: . dextrose 5 % and 0.45 % NaCl with KCl 20 mEq/L 75 mL/hr at 09/01/15 1805   PRN Meds: acetaminophen **OR** acetaminophen, hydrALAZINE, HYDROmorphone (DILAUDID) injection, menthol-cetylpyridinium, ondansetron (ZOFRAN) IV  Time spent: 30 minutes  Author: Berle Mull, MD [facility] Pager: [phone] 09/01/2015 7:31  PM  If 7PM-7AM, please contact night-coverage at [email or web address], password [email or web address]

## 2015-09-01 NOTE — Progress Notes (Signed)
Physical Therapy Treatment Patient Details Name: Robert [name] MRN: [id number] DOB: 12/12/1940 Today's Date: 09/01/2015    History of Present Illness 75 y.o. with mechanical mitral valve on coumadin, HTN, HLD, CHF, CKD, DM2 with remote h/o gastric cancer s/p partial gastrectomy at [facility] in 2005 comes in with generalized abdominal pain which started on Saturday.    PT Comments    Continued weakness and fatigue, but pt able to increase amb distance; pt opted to not use RW today, with some noted unsteadiness; will try with the RW next time to help with stability and energy conservation  Follow Up Recommendations  No PT follow up     Equipment Recommendations  Other (comment) (Will consider RW)    Recommendations for Other Services       Precautions / Restrictions Precautions Precautions: Fall;Other (comment) Precaution Comments: NG tube -- clamped on 6/21    Mobility  Bed Mobility                  Transfers Overall transfer level: Needs assistance Equipment used:  (unilateral UE support pushing IV pole) Transfers: Sit to/from Stand Sit to Stand: Min guard         General transfer comment: min-guard for safety but pt with sufficient strength to rise, very fatigued throughout all mobility  Ambulation/Gait Ambulation/Gait assistance: Min guard;Min assist Ambulation Distance (Feet): 200 Feet Assistive device:  (pushing IV pole and close guard) Gait Pattern/deviations: Step-through pattern;Narrow base of support Gait velocity: decreased   General Gait Details: Noting 3-4 instances of pt with slight losses of balance requiring min assist to steady; Still quite fatigued; declined using RW when I offered   Stairs            Wheelchair Mobility    Modified Rankin (Stroke Patients Only)       Balance     Sitting balance-Leahy Scale: Good       Standing balance-Leahy Scale: Fair                      Cognition Arousal/Alertness:  Awake/alert Behavior During Therapy: WFL for tasks assessed/performed Overall Cognitive Status: Within Functional Limits for tasks assessed                      Exercises      General Comments        Pertinent Vitals/Pain Pain Assessment: Faces Faces Pain Scale: Hurts little more Pain Location: abdomen Pain Descriptors / Indicators: Grimacing Pain Intervention(s): Monitored during session    Home Living                      Prior Function            PT Goals (current goals can now be found in the care plan section) Acute Rehab PT Goals Patient Stated Goal: not stated PT Goal Formulation: With patient Time For Goal Achievement: 09/12/15 Potential to Achieve Goals: Good Progress towards PT goals: Progressing toward goals    Frequency  Min 3X/week    PT Plan Current plan remains appropriate    Co-evaluation             End of Session   Activity Tolerance: Patient limited by fatigue Patient left: in bed;with call bell/phone within reach     Time: 1211-1223 PT Time Calculation (min) (ACUTE ONLY): 12 min  Charges:  $Gait Training: 8-22 mins  G Codes:      Robert [name] Hamff 09/01/2015, 1:09 PM  Robert [name], [place] Pager [phone] Office [phone]

## 2015-09-01 NOTE — Progress Notes (Signed)
Nutrition Follow-up  DOCUMENTATION CODES:   Severe malnutrition in context of chronic illness  INTERVENTION:  Continue Boost Breeze po TID, each supplement provides 250 kcal and 9 grams of protein.  TPN if unable to tolerate po intake in next 3 days.   RD to continue to monitor.   NUTRITION DIAGNOSIS:   Malnutrition related to chronic illness as evidenced by energy intake < or equal to 75% for > or equal to 1 month, severe depletion of muscle mass; ongoing  GOAL:   Patient will meet greater than or equal to 90% of their needs; not met  MONITOR:   PO intake, Supplement acceptance, Diet advancement, Weight trends, Labs, I & O's  REASON FOR ASSESSMENT:   Consult Assessment of nutrition requirement/status  ASSESSMENT:   75 yo with mechanical mitral valve on coumadin, HTN, HPL, CKD, DM2 with remote h/o gastric cancer s/p partial gastrectomy at [facility] in 2005 comes in with generalized abd pain which started on Saturday. Pain was constant, didn't go away, developed n/v so came to ED. Last BM was Saturday morning. +burping. Has had some mild postprandial upper abd pain for awhile but is very limited and goes away rather quickly o/w no previous symptoms. No melena/hematochezia.   Emesis with clears 6/19 night. NGT in place. NGT output low. RD consulted for TPN, however TPN orders discontinued. Pt reports feeling better and would like to try clear liquid diet again. Per MD note, if pt is unable to tolerate po diet within the next 3 days, will start TPN. Pt may require surgery. During time of visit, pt was sipping on ice water. Pt currently has Boost Breeze ordered. Pt reports he will try the supplement later in the day.   RD to continue to monitor.   Labs and medications reviewed.   Diet Order:  Diet clear liquid Room service appropriate?: Yes; Fluid consistency:: Thin  Skin:  Reviewed, no issues  Last BM:  6/18  Height:   Ht Readings from Last 1 Encounters:  08/29/15 5' 6"  (1.676 m)    Weight:   Wt Readings from Last 1 Encounters:  08/29/15 117 lb 15.1 oz (53.5 kg)    Ideal Body Weight:  59 kg  BMI:  Body mass index is 19.05 kg/(m^2).  Estimated Nutritional Needs:   Kcal:  1700-1900  Protein:  75-85 grams  Fluid:  1.7 - 1.9 L/day  EDUCATION NEEDS:   Education needs addressed  Corrin Parker, MS, RD, LDN Pager # [phone] After hours/ weekend pager # [phone]

## 2015-09-01 NOTE — Progress Notes (Signed)
Patient ID: Robert [name], male   DOB: November 13, 1940, 75 y.o.   MRN: [id number]   LOS: 3 days   Subjective: Feels better, denies pain, no flatus/BM but feels like things are moving. Wants to try clamping tube and trying clears. Brother Sales promotion account executive) wants glycerine supp as well.   Objective: Vital signs in last 24 hours: Temp:  [97.6 F (36.4 C)-98.3 F (36.8 C)] 97.9 F (36.6 C) (06/21 0926) Pulse Rate:  [57-72] 60 (06/21 0926) Resp:  [17-20] 17 (06/21 0926) BP: (118-156)/(52-68) 146/62 mmHg (06/21 0926) SpO2:  [95 %-98 %] 95 % (06/21 0926) Last BM Date: 08/29/15   NGT: 6ml/24h, feculent appearing   Laboratory  CBC  Recent Labs  08/31/15 0508 09/01/15 0515  WBC 6.7 10.1  HGB 12.7* 11.6*  HCT 39.9 36.8*  PLT 198 183   BMET  Recent Labs  08/31/15 0508 09/01/15 0515  NA 141 142  K 3.7 3.4*  CL 103 104  CO2 27 26  GLUCOSE 139* 121*  BUN 44* 73*  CREATININE 2.24* 2.49*  CALCIUM 8.5* 8.4*   Lab Results  Component Value Date   INR 3.17* 09/01/2015   INR 3.10* 08/31/2015   INR 2.27* 08/30/2015    Physical Exam General appearance: alert and no distress Resp: clear to auscultation bilaterally Cardio: regular rate and rhythm GI: Soft, NT, no distension, +BS   Assessment/Plan: HD#4 pSBO- Given low NGT OP and improvement in symptoms not unreasonable to try clamping tube and clears. If return of pain, N/V then will likely reinstitute SB protocol one more time. Low threshold for TPN if unable to tolerate enteral diet in next 3 days. May require surgery.  Hx mitral valve replacement with mechanical valve-INR up to 3.17 today.On heparin drip.Continue hold coumadin.  CKD, stage III-sCr worsened CHF-cards following FEN- Clears Dispo- SBO   Lisette Abu, PA-C Pager: [phone] 09/01/2015

## 2015-09-01 NOTE — Progress Notes (Addendum)
PARENTERAL NUTRITION CONSULT NOTE - INITIAL  Pharmacy Consult for TPN Indication: bowel obstruction  Allergies  Allergen Reactions  . Penicillins Other (See Comments)  . Vancomycin Other (See Comments)    Kidney problems    Patient Measurements: Height: 5' 6" (167.6 cm) Weight: 117 lb 15.1 oz (53.5 kg) IBW/kg (Calculated) : 63.8   Vital Signs: Temp: 98.3 F (36.8 C) (06/21 0810) Temp Source: Oral (06/21 0810) BP: 118/57 mmHg (06/21 0810) Pulse Rate: 57 (06/21 0810) Intake/Output from previous day: 06/20 0701 - 06/21 0700 In: 2313.3 [P.O.:2120; I.V.:193.3] Out: 603 [Urine:555; Emesis/NG output:48] Intake/Output from this shift:    Labs:  Recent Labs  08/30/15 0025 08/31/15 0504 08/31/15 0508 09/01/15 0515  WBC 8.9  --  6.7 10.1  HGB 13.3  --  12.7* 11.6*  HCT 41.4  --  39.9 36.8*  PLT 203  --  198 183  INR 2.27* 3.10*  --  3.17*     Recent Labs  08/30/15 0025 08/31/15 0508 09/01/15 0515  NA 140 141 142  K 4.0 3.7 3.4*  CL 103 103 104  CO2 _0 GLUCOSE 147* 139* 121*  BUN 34* 44* 73*  CREATININE 1.80* 2.24* 2.49*  CALCIUM 8.4* 8.5* 8.4*  MG  --   --  2.4  PHOS  --   --  4.0  PROT 7.4 7.1 6.7  ALBUMIN 3.4* 3.2* 3.0*  AST 35 37 32  ALT _1 ALKPHOS 67 56 50  BILITOT 1.4* 1.7* 2.3*  PREALBUMIN  --   --  13.3*  TRIG  --   --  102   Estimated Creatinine Clearance: 19.1 mL/min (by C-G formula based on Cr of 2.49).    Recent Labs  08/30/15 0826 08/31/15 0726 09/01/15 0808  GLUCAP 112* 139* 114*    Medical History: Past Medical History  Diagnosis Date  . Hypertension   . High cholesterol   . Chronic anticoagulation     coumadin for mechanical valves  . BPH (benign prostatic hyperplasia)   . CKD (chronic kidney disease), stage III   . Gastric cancer ([place]) 2005    s/p gastrectomy  . Rheumatic heart disease   . HOH (hard of hearing)   . Diabetes mellitus type 2, diet-controlled ([place])   . S/P MVR (mitral valve replacement)  2015    32m StJude mechanical valve     Insulin Requirements in the past 24 hours:  none  Assessment: 89yom with SBO/ileus. Emesis w/ clears. Pharmacy consulted to start TPN.  Surgeries/Procedures: GI: pSBO/ileus. Emesis 6/20 w/ clears, NGT replaced. No BS/BM. Pre-alb 13.3 Endo: Hx diet-controlled DM2. CBGs 114-121 Lytes: k 3.4 (goal >/=4 w/ ileus), corCa~8.9, Phos 4, Mag 2.4 Renal: CKD3. SCr 1.61 on admit, up to 2.49, CrCl~19, UOP 0.4 per documentation Pulm: RA Cards: warf pta for MVR (hold) > hep IV. Lopressor IV, ntg td Hepatobil: LFTs wnl, alk phos wnl, tbili up 1.7>2.3, TG 102 Neuro: no issues ID: AF, wbc wnl, no abx Best Practices: hepiv TPN Access: PICC to be placed 6/21 per RN TPN start date: 6/21>>   Current Nutrition:  NPO  Nutritional Goals: (per RD recs 6/19) 1600-1900 kCal, 70-85 grams of protein per day  Plan:  -Start Clinimix E 5/15 at 35 ml/h + lipids at 132mh. Start low and titrate slowly to goal. Will provide 42g protein + 1076kcal in 24h period -MVI + trace elements in TPN bag -Add SSI-sensitive q6h and monitor CBGs -K runs x3 -  F/u TPN labs in the AM   Elicia Lamp, PharmD, [place] Pharmacist Pager [phone] 09/01/2015 9:08 AM  ADDENDUM:  PICC placement on hold for now. Will be unable to start TPN today. MD d/c'd TPN consult. Please re-consult as necessary.  Elicia Lamp, PharmD, BCPS Clinical Pharmacist Pager [phone] 09/01/2015 10:18 AM

## 2015-09-01 NOTE — Progress Notes (Signed)
ANTICOAGULATION CONSULT NOTE - Follow Up Consult  Pharmacy Consult for Heparin Indication: Mech MVR  Allergies  Allergen Reactions  . Penicillins Other (See Comments)  . Vancomycin Other (See Comments)    Kidney problems    Patient Measurements: Height: 5\' 6"  (167.6 cm) Weight: 117 lb 15.1 oz (53.5 kg) IBW/kg (Calculated) : 63.8 Heparin Dosing Weight: 53.5 kg  Vital Signs: Temp: 98.3 F (36.8 C) (06/21 0810) Temp Source: Oral (06/21 0810) BP: 118/57 mmHg (06/21 0810) Pulse Rate: 57 (06/21 0810)  Labs:  Recent Labs  08/29/15 1504 08/29/15 2021  08/30/15 0025  08/30/15 2003 08/31/15 0504 08/31/15 0508 09/01/15 0515  HGB  --   --   < > 13.3  --   --   --  12.7* 11.6*  HCT  --   --   --  41.4  --   --   --  39.9 36.8*  PLT  --   --   --  203  --   --   --  198 183  LABPROT  --   --   --  24.9*  --   --  31.3*  --  31.9*  INR  --   --   --  2.27*  --   --  3.10*  --  3.17*  HEPARINUNFRC 0.52  --   --  0.69  < > 0.56 0.30  --  0.20*  CREATININE  --   --   --  1.80*  --   --   --  2.24* 2.49*  TROPONINI 0.31* 0.37*  --   --   --   --   --  0.16*  --   < > = values in this interval not displayed.  Estimated Creatinine Clearance: 19.1 mL/min (by C-G formula based on Cr of 2.49).   Assessment:  Anticoagulation: Heparin drip for hx mech MVR and warf on hold d/t SBO. HL 0.2, INR 2.27>3.1>3.17 this AM (AST/ALT ok, but Tbili up). Hgb also down 12.7>11.6. Plts ok -Home regimen: 3 mg daily except 4.5 mg on Mon and Fri (goal 2.5-3.5). T/O: Dr. Posey Pronto ok to hold Heparin until INR drops.  Goal of Therapy:  Heparin level 0.3-0.7 units/ml when INR<2.5 Monitor platelets by anticoagulation protocol: Yes   Plan:   Hold IV heparin  Daily CBC and PT/INR Start new TPN 6/21  Robert [name], PharmD, BCPS Clinical Staff Pharmacist Pager [phone]  Robert [name] 09/01/2015,8:45 AM

## 2015-09-01 NOTE — Progress Notes (Signed)
Echocardiogram 2D Echocardiogram has been performed.  Tresa Res 09/01/2015, 12:15 PM

## 2015-09-02 ENCOUNTER — Inpatient Hospital Stay (HOSPITAL_COMMUNITY): Payer: BC Managed Care – PPO

## 2015-09-02 DIAGNOSIS — N183 Chronic kidney disease, stage 3 (moderate): Secondary | ICD-10-CM

## 2015-09-02 LAB — CBC
HCT: 35.9 % — ABNORMAL LOW (ref 39.0–52.0)
Hemoglobin: 11.4 g/dL — ABNORMAL LOW (ref 13.0–17.0)
MCH: 29.1 pg (ref 26.0–34.0)
MCHC: 31.8 g/dL (ref 30.0–36.0)
MCV: 91.6 fL (ref 78.0–100.0)
PLATELETS: 183 10*3/uL (ref 150–400)
RBC: 3.92 MIL/uL — AB (ref 4.22–5.81)
RDW: 14.4 % (ref 11.5–15.5)
WBC: 7.5 10*3/uL (ref 4.0–10.5)

## 2015-09-02 LAB — COMPREHENSIVE METABOLIC PANEL
ALK PHOS: 51 U/L (ref 38–126)
ALT: 22 U/L (ref 17–63)
AST: 26 U/L (ref 15–41)
Albumin: 2.8 g/dL — ABNORMAL LOW (ref 3.5–5.0)
Anion gap: 7 (ref 5–15)
BILIRUBIN TOTAL: 1.9 mg/dL — AB (ref 0.3–1.2)
BUN: 63 mg/dL — AB (ref 6–20)
CALCIUM: 8.3 mg/dL — AB (ref 8.9–10.3)
CO2: 26 mmol/L (ref 22–32)
CREATININE: 1.85 mg/dL — AB (ref 0.61–1.24)
Chloride: 108 mmol/L (ref 101–111)
GFR calc Af Amer: 39 mL/min — ABNORMAL LOW (ref >60)
GFR, EST NON AFRICAN AMERICAN: 34 mL/min — AB (ref >60)
Glucose, Bld: 134 mg/dL — ABNORMAL HIGH (ref 65–99)
POTASSIUM: 3.5 mmol/L (ref 3.5–5.1)
Sodium: 141 mmol/L (ref 135–145)
TOTAL PROTEIN: 6.4 g/dL — AB (ref 6.5–8.1)

## 2015-09-02 LAB — MAGNESIUM: Magnesium: 2.5 mg/dL — ABNORMAL HIGH (ref 1.7–2.4)

## 2015-09-02 LAB — GLUCOSE, CAPILLARY
GLUCOSE-CAPILLARY: 125 mg/dL — AB (ref 65–99)
GLUCOSE-CAPILLARY: 151 mg/dL — AB (ref 65–99)
Glucose-Capillary: 134 mg/dL — ABNORMAL HIGH (ref 65–99)
Glucose-Capillary: 139 mg/dL — ABNORMAL HIGH (ref 65–99)
Glucose-Capillary: 147 mg/dL — ABNORMAL HIGH (ref 65–99)
Glucose-Capillary: 163 mg/dL — ABNORMAL HIGH (ref 65–99)

## 2015-09-02 LAB — PROTIME-INR
INR: 2.96 — ABNORMAL HIGH (ref 0.00–1.49)
Prothrombin Time: 30.3 seconds — ABNORMAL HIGH (ref 11.6–15.2)

## 2015-09-02 MED ORDER — ENSURE ENLIVE PO LIQD
237.0000 mL | Freq: Three times a day (TID) | ORAL | Status: DC
  Administered 2015-09-02 – 2015-09-03 (×3): 237 mL via ORAL

## 2015-09-02 NOTE — Progress Notes (Signed)
Patient had a BM tonight.

## 2015-09-02 NOTE — Progress Notes (Signed)
Patient requesting to shower.  MD notified.

## 2015-09-02 NOTE — Progress Notes (Signed)
Patient walked 200 feet with wife. Only needed to take his IV pole with him.

## 2015-09-02 NOTE — Progress Notes (Signed)
[facility] Progress Note     Subjective: Laying in bed. NAD. NG clamped and in place. Tolerating clears. No N/V. BM this morning after suppository that was brown, non-bloody, and loose. Urinating. Ambulating well.   Patient and his wife express that they are vegetarian and are having trouble with hospital food. Wife requests to bring food from home as long as it meets dietary requirements (full liquid/low fiber).   Objective: Vital signs in last 24 hours: Temp:  [97.8 F (36.6 C)-98.3 F (36.8 C)] 97.8 F (36.6 C) (06/22 0755) Pulse Rate:  [52-58] 54 (06/22 0755) Resp:  [17-18] 17 (06/22 0547) BP: (146-163)/(48-69) 150/69 mmHg (06/22 0755) SpO2:  [96 %-98 %] 98 % (06/22 0755) Weight:  [54.2 kg (119 lb 7.8 oz)] 54.2 kg (119 lb 7.8 oz) (06/22 0500) Last BM Date: 08/29/15  Intake/Output from previous day: 06/21 0701 - 06/22 0700 In: 1997.5 [P.O.:160; I.V.:1787.5; IV Piggyback:50] Out: 325 [Urine:325] Intake/Output this shift: Total I/O In: 10 [P.O.:10] Out: 0   PE: Gen:  Alert, NAD, pleasant Card:  RRR, no M/G/R heard Pulm:  CTA, no W/R/R Abd: Soft, NT/ND, +BS,   Lab Results:   Recent Labs  09/01/15 0515 09/02/15 0535  WBC 10.1 7.5  HGB 11.6* 11.4*  HCT 36.8* 35.9*  PLT 183 183   BMET  Recent Labs  09/01/15 0515 09/02/15 0535  NA 142 141  K 3.4* 3.5  CL 104 108  CO2 26 26  GLUCOSE 121* 134*  BUN 73* 63*  CREATININE 2.49* 1.85*  CALCIUM 8.4* 8.3*   PT/INR  Recent Labs  09/01/15 0515 09/02/15 0535  LABPROT 31.9* 30.3*  INR 3.17* 2.96*   CMP     Component Value Date/Time   NA 141 09/02/2015 0535   K 3.5 09/02/2015 0535   CL 108 09/02/2015 0535   CO2 26 09/02/2015 0535   GLUCOSE 134* 09/02/2015 0535   BUN 63* 09/02/2015 0535   CREATININE 1.85* 09/02/2015 0535   CREATININE 1.81* 05/31/2015 0803   CALCIUM 8.3* 09/02/2015 0535   PROT 6.4* 09/02/2015 0535   ALBUMIN 2.8* 09/02/2015 0535   AST 26 09/02/2015 0535   ALT 22  09/02/2015 0535   ALKPHOS 51 09/02/2015 0535   BILITOT 1.9* 09/02/2015 0535   GFRNONAA 34* 09/02/2015 0535   GFRAA 39* 09/02/2015 0535   Lipase     Component Value Date/Time   LIPASE 36 08/31/2015 0508       Studies/Results: US Renal  09/01/2015  CLINICAL DATA:  Acute kidney insufficiency, chronic kidney disease EXAM: RENAL / URINARY TRACT ULTRASOUND COMPLETE COMPARISON:  Ultrasound of the kidneys 09/29/2014 and CT scan 08/29/2015 FINDINGS: Right Kidney: Length: 10.3 cm. Echogenicity within normal limits. No mass or hydronephrosis visualized. There is a mid to lower pole cyst measures 7 x 6 mm. Left Kidney: Length: 9.8 cm in length. Echogenicity within normal limits. No mass or hydronephrosis visualized. There is a lower pole cyst measures 9 x 9 mm. Bladder: A urinary bladder diverticulum is noted to the right site measures 2.6 x 2.4 cm. Otherwise the urinary bladder is unremarkable. IMPRESSION: 1. No hydronephrosis. No renal calculi. Bilateral small renal cysts. 2. 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 Electronically Signed   By: Lahoma Crocker M.D.   On: 09/01/2015 09:39   Dg Abd Portable 1v  09/02/2015  CLINICAL DATA:  History of small bowel obstruction, followup EXAM: PORTABLE ABDOMEN - 1 VIEW COMPARISON:  Abdomen films of 08/31/2015 and CT abdomen  pelvis of 08/29/2015 FINDINGS: There is still some dilatation of small bowel loops consistent with partial small bowel obstruction. There is contrast and a small amount air within the colon. NG tube tip lies in the region of the mid body the stomach. IMPRESSION: Persistent slightly distended small bowel loops most consistent with persistent partial SBO. Electronically Signed   By: Ivar Drape M.D.   On: 09/02/2015 08:09    Anti-infectives: Anti-infectives    None     Assessment/Plan HD#5 pSBO- NG clamped yesterday. Tolerating clears. If return of pain, N/V then will likely reinstitute SB protocol one more time. Low  threshold for TPN if unable to tolerate enteral diet in next couple days. May require surgery.  Hx mitral valve replacement with mechanical valve-INR up to 3.17 today.On heparin drip.Continue hold coumadin.  CKD, stage III-sCr improving 1.85 CHF-cards following FEN- adv to full liquids, pull NG Dispo- pSBO   LOS: 4 days    Jill Alexanders , [facility] 09/02/2015, 10:55 AM Pager: [phone] Mon-Fri 7:00 am-4:30 pm Sat-Sun 7:00 am-11:30 am

## 2015-09-02 NOTE — Progress Notes (Signed)
NG tube removed per MD order. Patient tolerated well.

## 2015-09-02 NOTE — Progress Notes (Signed)
[facility] Progress Note  Patient: Robert [name] [id number]   PCP: Burman Freestone, MD DOB: 12-23-1940   DOA: 08/28/2015   DOS: 09/02/2015   Date of Service: the patient was seen and examined on 09/02/2015  Subjective: Tolerating clear liquids also had a BM yesterday no nausea at the time of my evaluation.. Nutrition: Tolerating liquids  Brief hospital course: Pt. 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 Currently further plan is to clamp the NG tube and allow clear liquid diet and monitor for improvement.  Assessment and Plan: 1. SBO (small bowel obstruction) (HCC) Currently conservative treatment. Repeat x-ray shows unresolved SBO. Patient does have bowel sounds and has been having a bowel movement as well. Tolerating clear liquid diet. Continue IV Pepcid, NG tube removed, diet advance by surgery.  2. Splenic infarct. Elevated lipase concerning for pancreatitis. Incidental findings finding along with CT scan for SBO. Continue monitoring at present. Lipase improved.  3. Acute on chronic kidney disease stage III Patient's serum creatinine as well as BUN significantly elevated and ultrasound renal unremarkable Minimal urine output. Improvement with IV hydration. Possibility of dehydration as well as hemodynamic instability as well as contrast-induced nephropathy as primary etiology.  4. Protein calorie malnutrition, severe. Patient has remained nothing by mouth for last 4 days. Continue nutritional supplementation  5. Mechanical mitral valve. On chronic anticoagulation. INR elevated likely from poor oral intake and vitamin K deficiency. At present we will hold heparin. Resume heparin when the patient's INR drops below 2.5  6. Elevated troponin. Hypertensive urgency. Patient presented  with significant elevated blood pressure as well as elevated troponin which is currently trending downwards. Echocardiogram today does not show any evidence of fall motion abnormality. Significant improvement in EF as well. No further ischemic workup planned from cardiology. Appreciate input.  Pain management: When necessary Tylenol, when necessary Dilaudid Activity: Consulted physical therapy Bowel regimen: last BM 09/01/2015 Diet: Liquid diet DVT Prophylaxis: on therapeutic anticoagulation.  Advance goals of care discussion: Partial code  Family Communication: family was present at bedside, at the time of interview. The pt provided permission to discuss medical plan with the family. Opportunity was given to ask question and all questions were answered satisfactorily.   Disposition:  Discharge to likely home. Expected discharge date: 09/05/2015, improvement in oral intake and renal function  Consultants: Gen. surgery, cardiology Procedures: ng tube insertion  Antibiotics: Anti-infectives    None        Intake/Output Summary (Last 24 hours) at 09/02/15 1811 Last data filed at 09/02/15 1720  Gross per 24 hour  Intake 2366.25 ml  Output      0 ml  Net 2366.25 ml   Filed Weights   08/29/15 2212 09/01/15 2139 09/02/15 0500  Weight: 53.5 kg (117 lb 15.1 oz) 54.2 kg (119 lb 7.8 oz) 54.2 kg (119 lb 7.8 oz)    Objective: Physical Exam: Filed Vitals:   09/02/15 0547 09/02/15 0755 09/02/15 1500 09/02/15 1719  BP: 163/68 150/69 170/72 182/69  Pulse: 52 54 55 61  Temp: 98 F (36.7 C) 97.8 F (36.6 C) 97.8 F (36.6 C) 97.8 F (36.6 C)  TempSrc: Oral Oral Oral Oral  Resp: 17  16 16   Height:      Weight:      SpO2: 97% 98% 100% 99%    General: Alert, Awake and Oriented to  Time, Place and Person. Appear in moderate distress Eyes: PERRL, Conjunctiva normal ENT: Oral Mucosa clear moist. Neck: no JVD, no Abnormal Mass Or lumps Cardiovascular: S1 and S2 Present, aortic  systolic Murmur, Respiratory: Bilateral Air entry equal and Decreased, Clear to Auscultation, no Crackles, no wheezes Abdomen: Bowel Sound present, Soft and no tenderness Skin: no redness, no Rash  Extremities: no Pedal edema, no calf tenderness Neurologic: Grossly no focal neuro deficit. Bilaterally Equal motor strength  Data Reviewed: CBC:  Recent Labs Lab 08/29/15 0414 08/30/15 0025 08/31/15 0508 09/01/15 0515 09/02/15 0535  WBC 11.1* 8.9 6.7 10.1 7.5  NEUTROABS  --   --   --  8.3*  --   HGB 13.4 13.3 12.7* 11.6* 11.4*  HCT 41.9 41.4 39.9 36.8* 35.9*  MCV 90.3 90.8 91.5 89.8 91.6  PLT 206 203 198 183 [id number]   Basic Metabolic Panel:  Recent Labs Lab 08/29/15 0414 08/30/15 0025 08/31/15 0508 09/01/15 0515 09/02/15 0535  NA 140 140 141 142 141  K 4.0 4.0 3.7 3.4* 3.5  CL 103 103 103 104 108  CO2 26 28 27 26 26   GLUCOSE 133* 147* 139* 121* 134*  BUN 24* 34* 44* 73* 63*  CREATININE 1.50* 1.80* 2.24* 2.49* 1.85*  CALCIUM 8.3* 8.4* 8.5* 8.4* 8.3*  MG  --   --   --  2.4 2.5*  PHOS  --   --   --  4.0  --     Liver Function Tests:  Recent Labs Lab 08/29/15 0414 08/30/15 0025 08/31/15 0508 09/01/15 0515 09/02/15 0535  AST 32 35 37 32 26  ALT 22 22 23 22 22   ALKPHOS 75 67 56 50 51  BILITOT 1.4* 1.4* 1.7* 2.3* 1.9*  PROT 7.1 7.4 7.1 6.7 6.4*  ALBUMIN 3.4* 3.4* 3.2* 3.0* 2.8*    Recent Labs Lab 08/28/15 2111 08/29/15 0053 08/29/15 0914 08/30/15 0025 08/31/15 0508  LIPASE 950*  --  112* 22 36  AMYLASE  --  979*  --  133* 183*   No results for input(s): AMMONIA in the last 168 hours. Coagulation Profile:  Recent Labs Lab 08/29/15 0414 08/30/15 0025 08/31/15 0504 09/01/15 0515 09/02/15 0535  INR 2.42* 2.27* 3.10* 3.17* 2.96*   Cardiac Enzymes:  Recent Labs Lab 08/29/15 0414 08/29/15 0914 08/29/15 1504 08/29/15 2021 08/31/15 0508  TROPONINI 0.09* 0.23* 0.31* 0.37* 0.16*   BNP (last 3 results) No results for input(s): PROBNP in the last  8760 hours.  CBG:  Recent Labs Lab 09/02/15 0005 09/02/15 0543 09/02/15 0754 09/02/15 1157 09/02/15 1716  GLUCAP 151* 134* 139* 125* 163*    Studies: Dg Abd Portable 1v  09/02/2015  CLINICAL DATA:  History of small bowel obstruction, followup EXAM: PORTABLE ABDOMEN - 1 VIEW COMPARISON:  Abdomen films of 08/31/2015 and CT abdomen pelvis of 08/29/2015 FINDINGS: There is still some dilatation of small bowel loops consistent with partial small bowel obstruction. There is contrast and a small amount air within the colon. NG tube tip lies in the region of the mid body the stomach. IMPRESSION: Persistent slightly distended small bowel loops most consistent with persistent partial SBO. Electronically Signed   By: Ivar Drape M.D.   On: 09/02/2015 08:09     Scheduled Meds: . antiseptic oral rinse  7 mL Mouth Rinse BID  . famotidine (PEPCID) IV  20 mg Intravenous Q12H  . feeding supplement  1 Container Oral TID BM  . feeding supplement (ENSURE ENLIVE)  237 mL Oral TID  BM  . insulin aspart  0-9 Units Subcutaneous Q6H  . metoprolol  5 mg Intravenous Q8H  . nitroGLYCERIN  1 inch Topical Q6H  . sodium chloride flush  3 mL Intravenous Q12H   Continuous Infusions: . dextrose 5 % and 0.45 % NaCl with KCl 20 mEq/L 75 mL/hr at 09/02/15 0817   PRN Meds: acetaminophen **OR** acetaminophen, hydrALAZINE, HYDROmorphone (DILAUDID) injection, menthol-cetylpyridinium, ondansetron (ZOFRAN) IV  Time spent: 30 minutes  Author: Berle Mull, MD [facility] Pager: [phone] 09/02/2015 6:11 PM  If 7PM-7AM, please contact night-coverage at [email or web address], password [facility]

## 2015-09-02 NOTE — Progress Notes (Signed)
ANTICOAGULATION CONSULT NOTE - Follow Up Consult  Pharmacy Consult for Heparin Indication: St Jude mechanical MVR  Allergies  Allergen Reactions  . Penicillins Other (See Comments)  . Vancomycin Other (See Comments)    Kidney problems    Patient Measurements: Height: 5\' 6"  (167.6 cm) Weight: 119 lb 7.8 oz (54.2 kg) IBW/kg (Calculated) : 63.8  Vital Signs: Temp: 97.8 F (36.6 C) (06/22 0755) Temp Source: Oral (06/22 0755) BP: 150/69 mmHg (06/22 0755) Pulse Rate: 54 (06/22 0755)  Labs:  Recent Labs  08/30/15 2003 08/31/15 0504  08/31/15 0508 09/01/15 0515 09/02/15 0535  HGB  --   --   < > 12.7* 11.6* 11.4*  HCT  --   --   --  39.9 36.8* 35.9*  PLT  --   --   --  198 183 183  LABPROT  --  31.3*  --   --  31.9* 30.3*  INR  --  3.10*  --   --  3.17* 2.96*  HEPARINUNFRC 0.56 0.30  --   --  0.20*  --   CREATININE  --   --   --  2.24* 2.49* 1.85*  TROPONINI  --   --   --  0.16*  --   --   < > = values in this interval not displayed.  Estimated Creatinine Clearance: 26 mL/min (by C-G formula based on Cr of 1.85).   Medications:  Heparin OFF  Assessment: 74 YOM on warfarin PTA for hx St. Jude mechanical MVR who presented on 6/18 with abdominal pain and N/V, found w/ SBO on CT- warfarin was held while NPO for possible surgical procedure. Heparin to restart when INR<2.5  INR today remains therapeutic (INR 2.96 << 3.17, goal of 2.5-3.5). The patient's NG tube has been clamped and the diet advanced to full liquids. No plans for heparin today - will f/u INR in the AM.   Goal of Therapy:  Heparin level 0.3-0.7 units/ml INR 2.5-3.5 Monitor platelets by anticoagulation protocol: Yes   Plan:  1. Continue to hold heparin today 2. Will follow-up diet tolerance for plans to resume po medications 3. Will follow-up INR results on 6/23 and plan to resume heparin if/when INR falls below 2.5  Alycia Rossetti, PharmD, BCPS Clinical Pharmacist Pager: [phone] 09/02/2015 2:30 PM

## 2015-09-02 NOTE — Discharge Instructions (Signed)
Low-Fiber Diet °Fiber is found in fruits, vegetables, and whole grains. A low-fiber diet restricts fibrous foods that are not digested in the small intestine. A diet containing about 10-15 grams of fiber per day is considered low fiber. Low-fiber diets may be used to: °· Promote healing and rest the bowel during intestinal flare-ups. °· Prevent blockage of a partially obstructed or narrowed gastrointestinal tract. °· Reduce fecal weight and volume. °· Slow the movement of feces. °You may be on a low-fiber diet as a transitional diet following surgery, after an injury (trauma), or because of a short (acute) or lifelong (chronic) illness. Your health care provider will determine the length of time you need to stay on this diet.  °WHAT DO I NEED TO KNOW ABOUT A LOW-FIBER DIET? °Always check the fiber content on the packaging's Nutrition Facts label, especially on foods from the grains list. Ask your dietitian if you have questions about specific foods that are related to your condition, especially if the food is not listed below. In general, a low-fiber food will have less than 2 g of fiber. °WHAT FOODS CAN I EAT? °Grains °All breads and crackers made with white flour. Sweet rolls, doughnuts, waffles, pancakes, French toast, bagels. Pretzels, Melba toast, zwieback. Well-cooked cereals, such as cornmeal, farina, or cream cereals. Dry cereals that do not contain whole grains, fruit, or nuts, such as refined corn, wheat, rice, and oat cereals. Potatoes prepared any way without skins, plain pastas and noodles, refined white rice. Use white flour for baking and making sauces. Use allowed list of grains for casseroles, dumplings, and puddings.  °Vegetables °Strained tomato and vegetable juices. Fresh lettuce, cucumber, spinach. Well-cooked (no skin or pulp) or canned vegetables, such as asparagus, bean sprouts, beets, carrots, green beans, mushrooms, potatoes, pumpkin, spinach, yellow squash, tomato sauce/puree, turnips,  yams, and zucchini. Keep servings limited to ½ cup.  °Fruits °All fruit juices except prune juice. Cooked or canned fruits without skin and seeds, such as applesauce, apricots, cherries, fruit cocktail, grapefruit, grapes, mandarin oranges, melons, peaches, pears, pineapple, and plums. Fresh fruits without skin, such as apricots, avocados, bananas, melons, pineapple, nectarines, and peaches. Keep servings limited to ½ cup or 1 piece.   °Meat and Other Protein Sources °Ground or well-cooked tender beef, ham, veal, lamb, pork, or poultry. Eggs, plain cheese. Fish, oysters, shrimp, lobster, and other seafood. Liver, organ meats. Smooth nut butters. °Dairy °All milk products and alternative dairy substitutes, such as soy, rice, almond, and coconut, not containing added whole nuts, seeds, or added fruit. °Beverages °Decaf coffee, fruit, and vegetable juices or smoothies (small amounts, with no pulp or skins, and with fruits from allowed list), sports drinks, herbal tea. °Condiments °Ketchup, mustard, vinegar, cream sauce, cheese sauce, cocoa powder. Spices in moderation, such as allspice, basil, bay leaves, celery powder or leaves, cinnamon, cumin powder, curry powder, ginger, mace, marjoram, onion or garlic powder, oregano, paprika, parsley flakes, ground pepper, rosemary, sage, savory, tarragon, thyme, and turmeric. °Sweets and Desserts °Plain cakes and cookies, pie made with allowed fruit, pudding, custard, cream pie. Gelatin, fruit, ice, sherbet, frozen ice pops. Ice cream, ice milk without nuts. Plain hard candy, honey, jelly, molasses, syrup, sugar, chocolate syrup, gumdrops, marshmallows. Limit overall sugar intake.  °Fats and Oil °Margarine, butter, cream, mayonnaise, salad oils, plain salad dressings made from allowed foods. Choose healthy fats such as olive oil, canola oil, and omega-3 fatty acids (such as found in salmon or tuna) when possible.  °Other °Bouillon, broth, or cream soups made   from allowed foods.  Any strained soup. Casseroles or mixed dishes made with allowed foods. °The items listed above may not be a complete list of recommended foods or beverages. Contact your dietitian for more options.  °WHAT FOODS ARE NOT RECOMMENDED? °Grains °All whole wheat and whole grain breads and crackers. Multigrains, rye, bran seeds, nuts, or coconut. Cereals containing whole grains, multigrains, bran, coconut, nuts, raisins. Cooked or dry oatmeal, steel-cut oats. Coarse wheat cereals, granola. Cereals advertised as high fiber. Potato skins. Whole grain pasta, wild or brown rice. Popcorn. Coconut flour. Bran, buckwheat, corn bread, multigrains, rye, wheat germ.  °Vegetables °Fresh, cooked or canned vegetables, such as artichokes, asparagus, beet greens, broccoli, Brussels sprouts, cabbage, celery, cauliflower, corn, eggplant, kale, legumes or beans, okra, peas, and tomatoes. Avoid large servings of any vegetables, especially raw vegetables.  °Fruits °Fresh fruits, such as apples with or without skin, berries, cherries, figs, grapes, grapefruit, guavas, kiwis, mangoes, oranges, papayas, pears, persimmons, pineapple, and pomegranate. Prune juice and juices with pulp, stewed or dried prunes. Dried fruits, dates, raisins. Fruit seeds or skins. Avoid large servings of all fresh fruits. °Meats and Other Protein Sources °Tough, fibrous meats with gristle. Chunky nut butter. Cheese made with seeds, nuts, or other foods not recommended. Nuts, seeds, legumes (beans, including baked beans), dried peas, beans, lentils.  °Dairy °Yogurt or cheese that contains nuts, seeds, or added fruit.  °Beverages °Fruit juices with high pulp, prune juice. Caffeinated coffee and teas.  °Condiments °Coconut, maple syrup, pickles, olives. °Sweets and Desserts °Desserts, cookies, or candies that contain nuts or coconut, chunky peanut butter, dried fruits. Jams, preserves with seeds, marmalade. Large amounts of sugar and sweets. Any other dessert made with  fruits from the not recommended list.  °Other °Soups made from vegetables that are not recommended or that contain other foods not recommended.  °The items listed above may not be a complete list of foods and beverages to avoid. Contact your dietitian for more information. °  °This information is not intended to replace advice given to you by your health care provider. Make sure you discuss any questions you have with your health care provider. °  °Document Released: 08/19/2001 Document Revised: 03/04/2013 Document Reviewed: 01/20/2013 °Elsevier Interactive Patient Education ©2016 [employer] ° °

## 2015-09-02 NOTE — Progress Notes (Signed)
OT Cancellation Note  Patient Details Name: Robert [name] MRN: [id number] DOB: 05/22/1940   Cancelled Treatment:    Reason Eval/Treat Not Completed: Other (comment).  Per NT, pt just finished shower.  Now with MD. Will likely check back tomorrow.  [name] 09/02/2015, 3:41 PM  Lesle Chris, OTR/L [phone] 09/02/2015

## 2015-09-03 DIAGNOSIS — N400 Enlarged prostate without lower urinary tract symptoms: Secondary | ICD-10-CM

## 2015-09-03 LAB — CBC
HCT: 32.6 % — ABNORMAL LOW (ref 39.0–52.0)
Hemoglobin: 10.6 g/dL — ABNORMAL LOW (ref 13.0–17.0)
MCH: 29.3 pg (ref 26.0–34.0)
MCHC: 32.5 g/dL (ref 30.0–36.0)
MCV: 90.1 fL (ref 78.0–100.0)
PLATELETS: 167 10*3/uL (ref 150–400)
RBC: 3.62 MIL/uL — AB (ref 4.22–5.81)
RDW: 14.2 % (ref 11.5–15.5)
WBC: 7.3 10*3/uL (ref 4.0–10.5)

## 2015-09-03 LAB — RENAL FUNCTION PANEL
Albumin: 2.6 g/dL — ABNORMAL LOW (ref 3.5–5.0)
Anion gap: 3 — ABNORMAL LOW (ref 5–15)
BUN: 40 mg/dL — ABNORMAL HIGH (ref 6–20)
CHLORIDE: 114 mmol/L — AB (ref 101–111)
CO2: 24 mmol/L (ref 22–32)
CREATININE: 1.52 mg/dL — AB (ref 0.61–1.24)
Calcium: 7.9 mg/dL — ABNORMAL LOW (ref 8.9–10.3)
GFR calc non Af Amer: 43 mL/min — ABNORMAL LOW (ref >60)
GFR, EST AFRICAN AMERICAN: 50 mL/min — AB (ref >60)
GLUCOSE: 143 mg/dL — AB (ref 65–99)
Phosphorus: 1.9 mg/dL — ABNORMAL LOW (ref 2.5–4.6)
Potassium: 3.5 mmol/L (ref 3.5–5.1)
Sodium: 141 mmol/L (ref 135–145)

## 2015-09-03 LAB — PROTIME-INR
INR: 3.08 — ABNORMAL HIGH (ref 0.00–1.49)
PROTHROMBIN TIME: 31.2 s — AB (ref 11.6–15.2)

## 2015-09-03 LAB — GLUCOSE, CAPILLARY
GLUCOSE-CAPILLARY: 135 mg/dL — AB (ref 65–99)
GLUCOSE-CAPILLARY: 167 mg/dL — AB (ref 65–99)
Glucose-Capillary: 151 mg/dL — ABNORMAL HIGH (ref 65–99)

## 2015-09-03 MED ORDER — ATORVASTATIN CALCIUM 20 MG PO TABS: 20.0000 mg | ORAL_TABLET | Freq: Every day | ORAL | Status: DC

## 2015-09-03 MED ORDER — VITAMIN B-12 1000 MCG PO TABS
1000.0000 ug | ORAL_TABLET | Freq: Every day | ORAL | Status: DC
  Administered 2015-09-03: 1000 ug via ORAL
  Filled 2015-09-03: qty 1

## 2015-09-03 MED ORDER — ENSURE ENLIVE PO LIQD: 237.0000 mL | Freq: Three times a day (TID) | ORAL | Status: DC

## 2015-09-03 MED ORDER — WARFARIN - PHARMACIST DOSING INPATIENT: Freq: Every day | Status: DC

## 2015-09-03 MED ORDER — BOOST / RESOURCE BREEZE PO LIQD: 1.0000 | Freq: Three times a day (TID) | ORAL | Status: DC

## 2015-09-03 MED ORDER — ISOSORBIDE MONONITRATE ER 30 MG PO TB24
15.0000 mg | ORAL_TABLET | Freq: Every day | ORAL | Status: DC
  Administered 2015-09-03: 15 mg via ORAL
  Filled 2015-09-03 (×2): qty 1

## 2015-09-03 MED ORDER — WARFARIN SODIUM 1 MG PO TABS: 1.0000 mg | ORAL_TABLET | Freq: Every day | ORAL | Status: DC

## 2015-09-03 MED ORDER — WARFARIN 0.5 MG HALF TABLET
0.5000 mg | ORAL_TABLET | Freq: Once | ORAL | Status: DC
  Filled 2015-09-03: qty 1

## 2015-09-03 MED ORDER — TAMSULOSIN HCL 0.4 MG PO CAPS: 0.4000 mg | ORAL_CAPSULE | Freq: Every day | ORAL | Status: DC

## 2015-09-03 MED ORDER — CARVEDILOL 6.25 MG PO TABS
6.2500 mg | ORAL_TABLET | Freq: Two times a day (BID) | ORAL | Status: DC
  Administered 2015-09-03: 6.25 mg via ORAL
  Filled 2015-09-03: qty 1

## 2015-09-03 MED ORDER — FERROUS SULFATE 325 (65 FE) MG PO TABS: 325.0000 mg | ORAL_TABLET | ORAL | Status: DC

## 2015-09-03 NOTE — Progress Notes (Signed)
Patient ID: Robert [name], male   DOB: 10-Dec-1940, 75 y.o.   MRN: [id number]   LOS: 5 days   Subjective: Doing much better. No N/V, +flatus/BM's. Just some mild pain when eating.   Objective: Vital signs in last 24 hours: Temp:  [97.4 F (36.3 C)-97.8 F (36.6 C)] 97.7 F (36.5 C) (06/23 0601) Pulse Rate:  [54-70] 66 (06/23 0601) Resp:  [16-18] 18 (06/23 0601) BP: (131-182)/(59-72) 159/65 mmHg (06/23 0601) SpO2:  [98 %-100 %] 99 % (06/23 0601) Weight:  [54.4 kg (119 lb 14.9 oz)] 54.4 kg (119 lb 14.9 oz) (06/23 0601) Last BM Date: 09/01/15   Laboratory  CBC  Recent Labs  09/02/15 0535 09/03/15 0347  WBC 7.5 7.3  HGB 11.4* 10.6*  HCT 35.9* 32.6*  PLT 183 167   BMET  Recent Labs  09/02/15 0535 09/03/15 0349  NA 141 141  K 3.5 3.5  CL 108 114*  CO2 26 24  GLUCOSE 134* 143*  BUN 63* 40*  CREATININE 1.85* 1.52*  CALCIUM 8.3* 7.9*   CBG (last 3)   Recent Labs  09/02/15 2354 09/03/15 0558 09/03/15 0733  GLUCAP 147* 151* 135*   Lab Results  Component Value Date   INR 3.08* 09/03/2015   INR 2.96* 09/02/2015   INR 3.17* 09/01/2015    Physical Exam General appearance: alert and no distress Resp: clear to auscultation bilaterally Cardio: regular rate and rhythm GI: normal findings: bowel sounds normal and soft, non-tender   Assessment/Plan: SBO- Resolved. I advised him to eat smaller more frequent meals at first - I suspect that will take care of the pain. Advance diet to regular. Hx mitral valve replacement with mechanical valve-INR up to 3.08 today.On heparin drip.Continue hold coumadin.  CKD, stage III-sCr improving 1.52 CHF-cards following [place] for discharge from surgical standpoint, we will sign off.    Lisette Abu, PA-C Pager: [phone] 09/03/2015

## 2015-09-03 NOTE — Progress Notes (Signed)
OT Cancellation Note  Patient Details Name: NOBERTO LIGMAN MRN: [id number] DOB: [birth year]   Cancelled Treatment:    Reason Eval/Treat Not Completed: Other (comment). Wife present in room; she and pt feel that pt does not need any further therapy.  Will sign off.    Zariel Capano 09/03/2015, 8:16 AM  Lesle Chris, OTR/L [phone] 09/03/2015

## 2015-09-03 NOTE — Progress Notes (Signed)
PT Cancellation Note  Patient Details Name: Robert [name] MRN: [id number] DOB: 1940-06-22   Cancelled Treatment:    Reason Eval/Treat Not Completed: Other (comment) (Refused over his fear of generating a bill).  Pt asking if tx covered or bill will be included for rehab and could not give him a definitive answer.  Asked PT to skip visit due to feeling like he is doing fine.  Will check next week to see if he is feeling differently and finish his POC.   Ramond Dial 09/03/2015, 8:36 AM    Mee Hives, PT MS Acute Rehab Dept. Number: [place] [facility] [place]

## 2015-09-03 NOTE — Progress Notes (Addendum)
ANTICOAGULATION CONSULT NOTE - Follow Up Consult  Pharmacy Consult for Warfarin Indication: St Jude mechanical MVR  Allergies  Allergen Reactions  . Penicillins Other (See Comments)  . Vancomycin Other (See Comments)    Kidney problems    Patient Measurements: Height: 5\' 6"  (167.6 cm) Weight: 119 lb 14.9 oz (54.4 kg) IBW/kg (Calculated) : 63.8  Vital Signs: Temp: 98.2 F (36.8 C) (06/23 1000) Temp Source: Oral (06/23 1000) BP: 168/63 mmHg (06/23 1000) Pulse Rate: 57 (06/23 1000)  Labs:  Recent Labs  09/01/15 0515 09/02/15 0535 09/03/15 0347 09/03/15 0349  HGB 11.6* 11.4* 10.6*  --   HCT 36.8* 35.9* 32.6*  --   PLT 183 183 167  --   LABPROT 31.9* 30.3* 31.2*  --   INR 3.17* 2.96* 3.08*  --   HEPARINUNFRC 0.20*  --   --   --   CREATININE 2.49* 1.85*  --  1.52*    Estimated Creatinine Clearance: 31.8 mL/min (by C-G formula based on Cr of 1.52).   Assessment: 68 YOM on warfarin PTA for hx St. Jude mechanical MVR who presented on 6/18 with abdominal pain and N/V, found w/ SBO on CT- warfarin was held while NPO for possible surgical procedure. SBO now resolved and pharmacy to resume warfarin on 6/23.   INR today remains therapeutic (INR 3.08 << 2.96, goal of 2.5-3.5) despite holding warfarin doses this admission (last dose on 6/16 PTA). Since the INR has stayed therapeutic for so long without any warfarin doses - will restart cautiously and monitor trends. The patient's diet has been advanced (regular-vegetarian) however he is still eating poorly - which will increase his warfarin sensitivity. Hgb/Hct slight drop, plts wnl - no overt s/sx of bleeding noted.   Addendum: Re-educated patient and wife on warfarin today. MD planning discharge and dosing at discharge hard to recommend given trends this admission and poor oral intake. Per discussion with the MD - a 1 mg dose daily through the weekend is reasonable (as the patient will be advancing his diet slowly) -  with an INR  check first thing on Monday morning (6/26). The patient and wife were counseled on signs/symptoms of stroke and that the warfarin dosing would be different initially upon discharge until the next INR check.   Goal of Therapy:  INR 2.5-3.5 (mechanical MVR) Monitor platelets by anticoagulation protocol: Yes   Plan:  1. Restart warfarin with 0.5 mg x 1 dose at 1800 today (if still here) 2. Will continue to monitor for any signs/symptoms of bleeding and will follow up with PT/INR in the a.m. (if still here)  Alycia Rossetti, PharmD, BCPS Clinical Pharmacist Pager: [phone] 09/03/2015 10:07 AM

## 2015-09-03 NOTE — Progress Notes (Signed)
D/C IV, D/C Telemetry, D/C paperwork reviewed with pt. And Pt.'s wife. Pt. And wife verbalized understanding of D/C instructions. Pt.'s wife given D/C paperwork and pt. Advised of upcoming appointments and advised of needed follow-up appointments within 1 week. Both pt. And wife verbalized understanding. Pt. Left unit in wheelchair by Nurse Tech and transported home by family.

## 2015-09-03 NOTE — Care Management Important Message (Signed)
Important Message  Patient Details  Name: Robert [name] MRN: [id number] Date of Birth: [birth year]   Medicare Important Message Given:  Yes    [name] 09/03/2015, 9:57 AM

## 2015-09-05 NOTE — Discharge Summary (Signed)
[facility] Discharge Summary   Patient: Robert [name] [id number]   PCP: Burman Freestone, MD DOB: [birth year]   Date of admission: 08/28/2015   Date of discharge: 09/03/2015     Discharge Diagnoses:  Principal Problem:   SBO (small bowel obstruction) (HCC) Active Problems:   Chronic systolic (congestive) heart failure (HCC)   Hypertensive urgency   CKD (chronic kidney disease) stage 3, GFR 30-59 ml/min   H/O mitral valve replacement with mechanical valve   Long term (current) use of anticoagulants [Z79.01]   BPH (benign prostatic hyperplasia)   Elevated troponin   Pancreatitis   Protein-calorie malnutrition, severe (HCC)   HLD (hyperlipidemia)   AKI (acute kidney injury) ([place])   Admitted From: home Disposition:  Home with home health  Recommendations for Outpatient Follow-up:  1. Atrophic PCP in one week with CBC and BMP. 2. Follow-up with CHF and heart care for INR checkup on Monday. 3. Establish care with nephrology in one month for chronic kidney disease   Follow-up Information    Follow up with [facility] E, MD. Schedule an appointment as soon as possible for a visit in 1 week.   Specialty:  Internal Medicine   Why:  cbc and bmp   Contact information:   [id number] [place] [address] [phone]       Follow up with [facility]. Call in 1 week.   Why:  for establishing care for CKD in 2-3 weeks.    Contact information:   [place] [address] [phone]       Follow up with [facility]. Go on 09/06/2015.   Specialty:  Cardiology   Why:  INR check up.    Contact information:   [place] [address] [place] [phone]     Diet recommendation: Cardiac diet soft for one week  Activity: The patient is advised to gradually reintroduce usual activities.  Discharge Condition: good  Code Status: Partial cord, DO NOT INTUBATE  History of present illness: As  per the H and P dictated on admission, "Robert [name] is a 74 y.o. male with medical history significant of gastric cancer (s/p of gastrectomy 2005, did not require chemotherapy or radiation therapy), hyperlipidemia, mitral valve replacement with mechanical valve on Coumadin, CKD-III, systolic congestive heart failure, who presents with vomiting and abdominal pain.  Patient states that he started having diffused abdominal pain in noon. It is associated with vomiting, but no nausea or diarrhea. Abdominal pain is a constant, 6 out of 10 in severity, radiating to bilateral back. It is not aggravated or alleviated by any known factors. He vomited several times without blood in the vomitus. Patient does not have fever or chills. Patient does not have chest pain, cough, shortness of breath, symptoms of UTI or unilateral weakness. Patient's initial blood pressure was elevated at 227/102, which improved to 195/97. "  Hospital Course:  Summary of his active problems in the hospital is as following. 1. SBO (small bowel obstruction) (HCC) Treated with conservative treatment. x-ray shows unresolved SBO. Tolerating soft diet without any vomiting. Having bowel movement as well. No abdominal pain identified by palpation.  2. Splenic infarct. Elevated lipase concerning for pancreatitis. Incidental findings finding along with CT scan for SBO. Continue monitoring at present. Lipase improved.  3. Acute on chronic kidney disease stage III Patient's serum creatinine as well as BUN significantly elevated and ultrasound renal unremarkable Improvement with IV hydration. Possibility of dehydration as  well as hemodynamic instability as well as contrast-induced nephropathy as primary etiology. Discontinue Lasix on discharge until seen by PCP Establish care with nephrology as an outpatient  4. Protein calorie malnutrition, severe. Continue nutritional supplementation  5. Mechanical mitral valve. On chronic  anticoagulation. INR elevated likely from poor oral intake and vitamin K deficiency. Patient will be discharged on 1 mg of warfarin and recommended to follow-up with Coumadin clinic on Monday to get INR checked.  6. Elevated troponin. Hypertensive urgency. Patient presented with significant elevated blood pressure as well as elevated troponin which is currently trending downwards. Echocardiogram does not show any evidence of wall motion abnormality. Significant improvement in EF as well. No further ischemic workup planned from cardiology. Appreciate input.  All other chronic medical condition were stable during the hospitalization.  Patient was ambulatory without any assistance. On the day of the discharge the patient's vitals are stable and patient was able to tolerate soft diet, and no other acute medical condition were reported by patient. the patient was felt safe to be discharge at home with family.  Procedures and Results:  NG tube insertion   Echocardiogram Study Conclusions  - Left ventricle: The cavity size was mildly dilated. There was  severe concentric hypertrophy. Systolic function was normal. The  estimated ejection fraction was in the range of 55% to 60%. The  study is not technically sufficient to allow evaluation of LV  diastolic function. - Aortic valve: There was trivial regurgitation. - Mitral valve: Normal appearing bileaflet mechanical valve with no  significant perivalvular regurgitation. Valve area by pressure  half-time: 1.98 cm^2. Valve area by continuity equation (using  LVOT flow): 1.14 cm^2. - Left atrium: The atrium was severely dilated. - Right atrium: The atrium was moderately dilated. - Atrial septum: No defect or patent foramen ovale was identified. - Pulmonary arteries: PA peak pressure: 46 mm Hg (S). - Pericardium, extracardiac: A trivial pericardial effusion was  identified.  Consultations:  General  surgery  Cardiology  DISCHARGE MEDICATION: Discharge Medication List as of 09/03/2015 12:01 PM    START taking these medications   Details  !! feeding supplement (BOOST / RESOURCE BREEZE) LIQD Take 1 Container by mouth 3 (three) times daily between meals., Starting 09/03/2015, Until Discontinued, Normal    !! feeding supplement, ENSURE ENLIVE, (ENSURE ENLIVE) LIQD Take 237 mLs by mouth 3 (three) times daily between meals., Starting 09/03/2015, Until Discontinued, Normal     !! - Potential duplicate medications found. Please discuss with provider.    CONTINUE these medications which have CHANGED   Details  warfarin (COUMADIN) 1 MG tablet Take 1 tablet (1 mg total) by mouth daily., Starting 09/04/2015, Until Discontinued, Normal      CONTINUE these medications which have NOT CHANGED   Details  aspirin 81 MG chewable tablet Chew 81 mg by mouth daily., Until Discontinued, Historical Med    atorvastatin (LIPITOR) 20 MG tablet Take 20 mg by mouth at bedtime., Until Discontinued, Historical Med    carvedilol (COREG) 6.25 MG tablet Take 1 tablet (6.25 mg total) by mouth 2 (two) times daily with a meal., Starting 06/10/2015, Until Discontinued, Normal    ferrous sulfate 325 (65 FE) MG tablet Take 1 tablet (325 mg total) by mouth 2 (two) times daily with a meal., Starting 05/13/2015, Until Discontinued, Print    isosorbide mononitrate (IMDUR) 30 MG 24 hr tablet Take 1 tablet (30 mg total) by mouth daily., Starting 06/10/2015, Until Discontinued, Normal    Multiple Vitamin (MULTIVITAMIN WITH  MINERALS) TABS tablet Take 1 tablet by mouth daily., Until Discontinued, Historical Med    tamsulosin (FLOMAX) 0.4 MG CAPS capsule Take 1 capsule (0.4 mg total) by mouth daily after supper., Starting 06/10/2015, Until Discontinued, Normal    vitamin B-12 (CYANOCOBALAMIN) 1000 MCG tablet Take 1 tablet (1,000 mcg total) by mouth daily., Starting 05/13/2015, Until Discontinued, Print      STOP taking these  medications     furosemide (LASIX) 40 MG tablet        Allergies  Allergen Reactions  . Penicillins Other (See Comments)  . Vancomycin Other (See Comments)    Kidney problems   Discharge Instructions    Diet - low sodium heart healthy    Complete by:  As directed      Discharge instructions    Complete by:  As directed   It is important that you read following instructions as well as go over your medication list with RN to help you understand your care after this hospitalization.  Discharge Instructions: Please follow-up with PCP in one week with bmp Establish care with nephrology Please follow soft low residue diet for 1 week.  Please request your primary care physician to go over all Hospital Tests and Procedure/Radiological results at the follow up,  Please get all Hospital records sent to your PCP by signing hospital release before you go home.   You were cared for by a hospitalist during your hospital stay. If you have any questions about your discharge medications or the care you received while you were in the hospital after you are discharged, you can call the unit and ask to speak with the hospitalist on call if the hospitalist that took care of you is not available.  Once you are discharged, your primary care physician will handle any further medical issues. Please note that NO REFILLS for any discharge medications will be authorized once you are discharged, as it is imperative that you return to your primary care physician (or establish a relationship with a primary care physician if you do not have one) for your aftercare needs so that they can reassess your need for medications and monitor your lab values. You Must read complete instructions/literature along with all the possible adverse reactions/side effects for all the Medicines you take and that have been prescribed to you. Take any new Medicines after you have completely understood and accept all the possible adverse  reactions/side effects. Wear Seat belts while driving.     Increase activity slowly    Complete by:  As directed           Discharge Exam: Filed Weights   09/02/15 0500 09/02/15 2114 09/03/15 0601  Weight: 54.2 kg (119 lb 7.8 oz) 54.4 kg (119 lb 14.9 oz) 54.4 kg (119 lb 14.9 oz)   Filed Vitals:   09/03/15 0850 09/03/15 1000  BP: 166/71 168/63  Pulse: 64 57  Temp:  98.2 F (36.8 C)  Resp:  16   General: Appear in no distress, no Rash; Oral Mucosa moist. Cardiovascular: S1 and S2 Present, no Murmur, no JVD Respiratory: Bilateral Air entry present and Clear to Auscultation, no Crackles, no wheezes Abdomen: Bowel Sound present, Soft and no tenderness Extremities: no Pedal edema, no calf tenderness Neurology: Grossly no focal neuro deficit.  The results of significant diagnostics from this hospitalization (including imaging, microbiology, ancillary and laboratory) are listed below for reference.    Significant Diagnostic Studies: Ct Abdomen Pelvis W Contrast  08/29/2015  CLINICAL DATA:  Abdominal pain and vomiting since yesterday. New onset pancreatitis. Previous history of partial gastrectomy. EXAM: CT ABDOMEN AND PELVIS WITH CONTRAST TECHNIQUE: Multidetector CT imaging of the abdomen and pelvis was performed using the standard protocol following bolus administration of intravenous contrast. CONTRAST:  75 ml ISOVUE-300 IOPAMIDOL (ISOVUE-300) INJECTION 61% COMPARISON:  PET-CT 01/17/2005 FINDINGS: Dependent changes in the lung bases. Cardiac enlargement with dilated left atrium. Mitral valve replacement. Small esophageal hiatal hernia. 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 The liver, gallbladder, pancreas, adrenal glands, abdominal aorta, inferior vena cava, kidneys, and retroperitoneal lymph nodes are unremarkable. Postoperative changes in the stomach consistent with partial gastrectomy and jejunal anastomosis. Dilated fluid-filled  proximal small bowel with decompressed bowel distally. Transition zone appears to be in the left upper quadrant and may represent internal hernia or a adhesion. Small ventral abdominal hernia containing fat. Scattered stool in the colon without colonic distention. Small amount of free fluid in the pelvis along the liver edge and pericolic gutter. No free air. Pelvis: The appendix is normal. Bladder wall is not thickened. Prostate gland is enlarged, measuring 4.3 cm diameter. Small right-sided bladder diverticulum and mildly distended bladder may represent chronic bladder outflow obstruction. No free or loculated pelvic fluid collections. No pelvic mass or lymphadenopathy. No evidence of diverticulitis. No destructive bone lesions. Small bowel obstruction with transition zone in the left upper quadrant, likely representing internal hernia or adhesion. Small ventral anterior abdominal wall hernia containing fat. Small amount of free fluid in the abdomen may be reactive. Prostate enlargement with distended bladder and bladder diverticulum may indicate bladder outflow obstruction. Electronically Signed   By: Lucienne Capers M.D.   On: 08/29/2015 02:48   US Renal  09/01/2015  CLINICAL DATA:  Acute kidney insufficiency, chronic kidney disease EXAM: RENAL / URINARY TRACT ULTRASOUND COMPLETE COMPARISON:  Ultrasound of the kidneys 09/29/2014 and CT scan 08/29/2015 FINDINGS: Right Kidney: Length: 10.3 cm. Echogenicity within normal limits. No mass or hydronephrosis visualized. There is a mid to lower pole cyst measures 7 x 6 mm. Left Kidney: Length: 9.8 cm in length. Echogenicity within normal limits. No mass or hydronephrosis visualized. There is a lower pole cyst measures 9 x 9 mm. Bladder: A urinary bladder diverticulum is noted to the right site measures 2.6 x 2.4 cm. Otherwise the urinary bladder is unremarkable. IMPRESSION: 1. No hydronephrosis. No renal calculi. Bilateral small renal cysts. 2. 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 Electronically Signed   By: Lahoma Crocker M.D.   On: 09/01/2015 09:39   Dg Abd 2 Views  08/31/2015  CLINICAL DATA:  Vomiting EXAM: ABDOMEN - 2 VIEW COMPARISON:  08/30/2015 FINDINGS: Mitral valve prosthesis. Faint interstitial accentuation in the lung bases, right greater than left. Residual contrast medium in the cecum, as on yesterday 's exam. Left paracentral loop of small bowel measuring 3.4 cm in diameter (formerly 4.0 cm in diameter) with air-fluid levels at differing vertical levels within the same loop. Several other central abdominal loops of small bowel demonstrating air-fluid levels. IMPRESSION: 1. Abnormal mildly dilated loops of small bowel in the central and left abdomen, less dilated than yesterday, but with air- fluid levels at differing vertical levels in the same loop raising the possibility of small bowel obstruction. 2. Lack of progression of contrast medium in the cecum. Electronically Signed   By: Van Clines M.D.   On: 08/31/2015 09:33   Dg Abd 2 Views  08/30/2015  CLINICAL  DATA:  75 year old male with abdominal pain worse in the left abdomen. No bowel movements. Recent small bowel obstruction with left upper quadrant transition on CT Abdomen and Pelvis. Initial encounter. EXAM: ABDOMEN - 2 VIEW COMPARISON:  08/29/2015 and earlier FINDINGS: Enteric tube removed. Continued dilated gas-filled small bowel loops in the left abdomen approaching 4 cm diameter. No pneumoperitoneum and negative visualized lung bases on the upright view. There is some retained oral contrast mixed with stool in the right colon from the recent CT Abdomen and Pelvis, indicating an incomplete obstruction. There is excreted IV contrast in the urinary bladder which is positive for a right side bladder diverticulum. No acute osseous abnormality identified. IMPRESSION: 1. Continued dilated small bowel in the left abdomen with constellation most compatible with unresolved partial  small bowel obstruction. No free air. 2. Enteric tube removed. 3. Excreted IV contrast in the urinary bladder with right side bladder diverticulum. Electronically Signed   By: Genevie Ann M.D.   On: 08/30/2015 16:15   Dg Abd Portable 1v  09/02/2015  CLINICAL DATA:  History of small bowel obstruction, followup EXAM: PORTABLE ABDOMEN - 1 VIEW COMPARISON:  Abdomen films of 08/31/2015 and CT abdomen pelvis of 08/29/2015 FINDINGS: There is still some dilatation of small bowel loops consistent with partial small bowel obstruction. There is contrast and a small amount air within the colon. NG tube tip lies in the region of the mid body the stomach. IMPRESSION: Persistent slightly distended small bowel loops most consistent with persistent partial SBO. Electronically Signed   By: Ivar Drape M.D.   On: 09/02/2015 08:09   Dg Abd Portable 1v-small Bowel Obstruction Protocol-initial, 8 Hr Delay  08/29/2015  CLINICAL DATA:  Follow-up bowel obstruction. Persistent periumbilical pain. EXAM: PORTABLE ABDOMEN - 1 VIEW COMPARISON:  Abdominal radiographs August 29, 2015 at 0809 hours FINDINGS: Contrast in the urinary bladder. Bowel gas pattern is nondilated and nonobstructive. Nasogastric tube projects in proximal stomach with side port at the level of the gastroesophageal junction. Surgical staples project in LEFT upper quadrant. No intra-abdominal mass effect or pathologic calcifications. Soft tissue planes and included osseous structure nonsuspicious. IMPRESSION: Normal bowel gas pattern. Nasal gastric tube side port now projects at gastroesophageal junction. Electronically Signed   By: Elon Alas M.D.   On: 08/29/2015 18:15   Dg Abd Portable 1v-small Bowel Protocol-position Verification  08/29/2015  CLINICAL DATA:  Encounter for nasogastric tube placement. EXAM: PORTABLE ABDOMEN - 1 VIEW COMPARISON:  None. FINDINGS: The bowel gas pattern is normal. No radio-opaque calculi or other significant radiographic abnormality  are seen. Distal tip of nasogastric tube is seen in proximal stomach. Residual contrast is noted in urinary bladder. IMPRESSION: Distal tip of nasogastric tube seen in proximal stomach. No evidence of bowel obstruction or ileus. Electronically Signed   By: Marijo Conception, M.D.   On: 08/29/2015 07:36    Microbiology: No results found for this or any previous visit (from the past 240 hour(s)).   Labs: CBC:  Recent Labs Lab 08/30/15 0025 08/31/15 0508 09/01/15 0515 09/02/15 0535 09/03/15 0347  WBC 8.9 6.7 10.1 7.5 7.3  NEUTROABS  --   --  8.3*  --   --   HGB 13.3 12.7* 11.6* 11.4* 10.6*  HCT 41.4 39.9 36.8* 35.9* 32.6*  MCV 90.8 91.5 89.8 91.6 90.1  PLT 203 198 183 183 [id number]   Basic Metabolic Panel:  Recent Labs Lab 08/30/15 0025 08/31/15 0508 09/01/15 0515 09/02/15 0535 09/03/15 0349  NA 140 141  142 141 141  K 4.0 3.7 3.4* 3.5 3.5  CL 103 103 104 108 114*  CO2 28 27 26 26 24   GLUCOSE 147* 139* 121* 134* 143*  BUN 34* 44* 73* 63* 40*  CREATININE 1.80* 2.24* 2.49* 1.85* 1.52*  CALCIUM 8.4* 8.5* 8.4* 8.3* 7.9*  MG  --   --  2.4 2.5*  --   PHOS  --   --  4.0  --  1.9*   Liver Function Tests:  Recent Labs Lab 08/30/15 0025 08/31/15 0508 09/01/15 0515 09/02/15 0535 09/03/15 0349  AST 35 37 32 26  --   ALT 22 23 22 22   --   ALKPHOS 67 56 50 51  --   BILITOT 1.4* 1.7* 2.3* 1.9*  --   PROT 7.4 7.1 6.7 6.4*  --   ALBUMIN 3.4* 3.2* 3.0* 2.8* 2.6*    Recent Labs Lab 08/30/15 0025 08/31/15 0508  LIPASE 22 36  AMYLASE 133* 183*   No results for input(s): AMMONIA in the last 168 hours. Cardiac Enzymes:  Recent Labs Lab 08/31/15 0508  TROPONINI 0.16*   BNP (last 3 results)  Recent Labs  05/06/15 1406 05/11/15 0551 08/29/15 0427  BNP 1406.9* 656.6* 816.0*   CBG:  Recent Labs Lab 09/02/15 1716 09/02/15 2354 09/03/15 0558 09/03/15 0733 09/03/15 1144  GLUCAP 163* 147* 151* 135* 167*   Time spent: 30 minutes  Signed:  September Mormile  [facility] 09/03/2015 , 9:42 PM

## 2015-09-06 ENCOUNTER — Ambulatory Visit (INDEPENDENT_AMBULATORY_CARE_PROVIDER_SITE_OTHER): Payer: BC Managed Care – PPO | Admitting: Pharmacist Clinician (PhC)/ Clinical Pharmacy Specialist

## 2015-09-06 ENCOUNTER — Ambulatory Visit: Payer: BC Managed Care – PPO | Admitting: Internal Medicine

## 2015-09-06 DIAGNOSIS — Z954 Presence of other heart-valve replacement: Secondary | ICD-10-CM | POA: Diagnosis not present

## 2015-09-06 DIAGNOSIS — Z7901 Long term (current) use of anticoagulants: Secondary | ICD-10-CM | POA: Diagnosis not present

## 2015-09-06 DIAGNOSIS — Z952 Presence of prosthetic heart valve: Secondary | ICD-10-CM

## 2015-09-06 LAB — POCT INR: INR: 1.3

## 2015-09-13 ENCOUNTER — Ambulatory Visit (INDEPENDENT_AMBULATORY_CARE_PROVIDER_SITE_OTHER): Payer: BC Managed Care – PPO | Admitting: Pharmacist

## 2015-09-13 DIAGNOSIS — Z7901 Long term (current) use of anticoagulants: Secondary | ICD-10-CM

## 2015-09-13 DIAGNOSIS — Z954 Presence of other heart-valve replacement: Secondary | ICD-10-CM

## 2015-09-13 DIAGNOSIS — Z952 Presence of prosthetic heart valve: Secondary | ICD-10-CM

## 2015-09-13 LAB — POCT INR: INR: 3.9

## 2015-09-20 ENCOUNTER — Other Ambulatory Visit: Payer: Self-pay | Admitting: Pharmacist Clinician (PhC)/ Clinical Pharmacy Specialist

## 2015-09-20 ENCOUNTER — Telehealth: Payer: Self-pay | Admitting: Internal Medicine

## 2015-09-20 ENCOUNTER — Ambulatory Visit (INDEPENDENT_AMBULATORY_CARE_PROVIDER_SITE_OTHER): Payer: BC Managed Care – PPO | Admitting: Pharmacist Clinician (PhC)/ Clinical Pharmacy Specialist

## 2015-09-20 DIAGNOSIS — Z7901 Long term (current) use of anticoagulants: Secondary | ICD-10-CM

## 2015-09-20 DIAGNOSIS — Z952 Presence of prosthetic heart valve: Secondary | ICD-10-CM

## 2015-09-20 DIAGNOSIS — Z954 Presence of other heart-valve replacement: Secondary | ICD-10-CM

## 2015-09-20 LAB — PROTIME-INR
INR: 6.1 — AB
PROTHROMBIN TIME: 59.9 s — AB (ref 9.0–11.5)

## 2015-09-20 LAB — POCT INR: INR: 7.4

## 2015-09-20 NOTE — Telephone Encounter (Signed)
Received incoming call for critical lab value from [place] at [facility]. Notes Recorded by Pixie Casino, MD on 09/20/2015 at 3:14 PM Ladies - Any idea why his INR's have been all over the board? Is it a compliance issue? What can I do to help?  Dr. Lemmie Evens       Ref Range 11:15 AM  10:28 AM  7d ago  2wk ago     Prothrombin Time 9.0 - 11.5 sec 59.9 (H)      Comments:   For more information on this test, go to:  http://education.[facility].com/faq/FAQ104    ** Please note change in reference range(s). **        INR  6.1 (H) 7.4 3.9 1.3   Comments: Result repeated and verified.    Reference Range            0.9-1.1  Moderate-intensity Warfarin Therapy  2.0-3.0  Higher-intensity Warfarin Therapy   3.0-4.0    ** Please note change in reference range(s). **         Routing to Pharmacy.

## 2015-09-20 NOTE — Telephone Encounter (Signed)
See anticoag note

## 2015-09-20 NOTE — Telephone Encounter (Signed)
New message     Nurse from [facility] calling with stat labs. Please advise.  

## 2015-09-24 ENCOUNTER — Ambulatory Visit (INDEPENDENT_AMBULATORY_CARE_PROVIDER_SITE_OTHER): Payer: BC Managed Care – PPO | Admitting: Pharmacist Clinician (PhC)/ Clinical Pharmacy Specialist

## 2015-09-24 DIAGNOSIS — Z954 Presence of other heart-valve replacement: Secondary | ICD-10-CM

## 2015-09-24 DIAGNOSIS — Z7901 Long term (current) use of anticoagulants: Secondary | ICD-10-CM

## 2015-09-24 DIAGNOSIS — Z952 Presence of prosthetic heart valve: Secondary | ICD-10-CM

## 2015-09-24 LAB — POCT INR: INR: 1.6

## 2015-09-29 ENCOUNTER — Other Ambulatory Visit: Payer: Self-pay | Admitting: Internal Medicine

## 2015-09-29 NOTE — Telephone Encounter (Signed)
Rx(s) sent to pharmacy electronically.  

## 2015-10-05 ENCOUNTER — Ambulatory Visit (INDEPENDENT_AMBULATORY_CARE_PROVIDER_SITE_OTHER): Payer: BC Managed Care – PPO | Admitting: Pharmacist

## 2015-10-05 DIAGNOSIS — Z954 Presence of other heart-valve replacement: Secondary | ICD-10-CM

## 2015-10-05 DIAGNOSIS — Z7901 Long term (current) use of anticoagulants: Secondary | ICD-10-CM | POA: Diagnosis not present

## 2015-10-05 DIAGNOSIS — Z952 Presence of prosthetic heart valve: Secondary | ICD-10-CM

## 2015-10-05 LAB — POCT INR: INR: 2.1

## 2015-10-05 MED ORDER — WARFARIN SODIUM 3 MG PO TABS: ORAL_TABLET | ORAL | 0 refills | Status: DC

## 2015-10-18 ENCOUNTER — Ambulatory Visit (INDEPENDENT_AMBULATORY_CARE_PROVIDER_SITE_OTHER): Payer: BC Managed Care – PPO | Admitting: Internal Medicine

## 2015-10-18 ENCOUNTER — Ambulatory Visit (INDEPENDENT_AMBULATORY_CARE_PROVIDER_SITE_OTHER): Payer: BC Managed Care – PPO | Admitting: Pharmacist Clinician (PhC)/ Clinical Pharmacy Specialist

## 2015-10-18 ENCOUNTER — Encounter: Payer: Self-pay | Admitting: Internal Medicine

## 2015-10-18 ENCOUNTER — Other Ambulatory Visit: Payer: Self-pay | Admitting: Internal Medicine

## 2015-10-18 VITALS — BP 136/72 | HR 56 | Ht 66.0 in | Wt 119.6 lb

## 2015-10-18 DIAGNOSIS — Z7901 Long term (current) use of anticoagulants: Secondary | ICD-10-CM | POA: Diagnosis not present

## 2015-10-18 DIAGNOSIS — N183 Chronic kidney disease, stage 3 unspecified: Secondary | ICD-10-CM

## 2015-10-18 DIAGNOSIS — Z954 Presence of other heart-valve replacement: Secondary | ICD-10-CM

## 2015-10-18 DIAGNOSIS — Z952 Presence of prosthetic heart valve: Secondary | ICD-10-CM

## 2015-10-18 DIAGNOSIS — E43 Unspecified severe protein-calorie malnutrition: Secondary | ICD-10-CM

## 2015-10-18 DIAGNOSIS — I2510 Atherosclerotic heart disease of native coronary artery without angina pectoris: Secondary | ICD-10-CM

## 2015-10-18 DIAGNOSIS — R9431 Abnormal electrocardiogram [ECG] [EKG]: Secondary | ICD-10-CM

## 2015-10-18 LAB — POCT INR: INR: 3.5

## 2015-10-18 NOTE — Progress Notes (Signed)
OFFICE NOTE  Chief Complaint:  Follow-up hospitalization  Primary Care Physician: Burman Freestone, MD  HPI:  Robert [name] is a 75 y.o. 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 He has been followed by Dr. Geraldo Pitter at [facility] cardiology and was referred to Dr. Evelina Dun at [facility] in 2013 for evaluation of his mitral stenosis. He was followed clinically and underwent mechanical mitral valve replacement in 2015. Since then he's been compliant on warfarin and has had therapeutic to supratherapeutic INRs. LV function was normal prior to surgery however we cannot find echocardiographic results after his surgery. A heart catheterization performed prior to surgery demonstrated a 70% diagonal stenosis and 55% LAD stenosis, but he did not have any bypass surgery. He now presents with one week of progressive shortness of breath, orthopnea and leg edema concerning for heart failure. He seems to be responding to IV Lasix. Chest x-ray shows COPD changes, small bilateral pleural effusions and some basilar atelectasis. Labs indicate markedly elevated BNP of 1406 and troponin is negative 3. There appears to be a mild iron deficiency anemia with H&H of 9 and 29. INR initially was 4.37 that came down to 3.98. He was admitted and treated for heart failure with diuresis. He underwent right and left heart catheterization which demonstrated the following:   Prox RCA lesion, 20% stenosed.  Prox LAD to Mid LAD lesion, 30% stenosed.  Mid LAD lesion, 40% stenosed.  Ost 1st Diag to 1st Diag lesion, 50% stenosed.  Ost 2nd Diag lesion, 30% stenosed.  1. Mild to moderate non-obstructive CAD 2. Normal movement of mechanical mitral valve leaflets.  3. Elevated filling pressures suggesting residual volume overload.   He also had an echocardiogram which showed a newly reduced LVEF of  45-50%. At discharge he was scheduled to be on Lasix 60 mg daily, and increase from his prior home dose of 40 mg daily. It was also recommended that he discontinue valsartan and spironolactone. After discharge he had routine lab work which demonstrated acute renal failure and creatinine had increased from 1.8-2.9. He was also markedly hyperkalemic at 5.7. This was brought to the attention of the [facility] DOD Dr. Marlou Porch, who recommended hydration and avoiding potassium with a repeat metabolic profile the next day. The repeat metabolic profile showed creatinine had decreased to 2.7 and potassium was 5.3. Since discharge Mr. [name] had poor appetite, weight has remained stable and he is felt weak and had poor energy. Much worse than when he was discharged. I spent a good deal of time reviewing the patient's medications with his wife who had a 3 x 5 note card with his previous medications on the front and new medications on the rear. She tells me that he is actually taking all of these medications, therefore he is taking his old dose of Lasix 40 mg in addition to Lasix 60 mg daily as well as spironolactone and valsartan. Certainly explain his acute renal failure as well as hyperkalemia. I reviewed his discharge medication summary which clearly states to discontinue valsartan and Lasix and noted the dose changes of medications. He was seen in his primary care doctor's office after discharge however it appears that these medication changes were not identified. In addition, his INR is markedly elevated today at 6.2. This is up from 3.5 where it was 1 week ago. I suspect again this is related to renal failure, decreased appetite and  nutritional reasons. Medication noncompliance could be playing a role.  Mr. [name] returns today for follow-up. Since we sorted out his medications he's done much better. His creatinine has gone back down to baseline of 1.8. He is on iron and B12 and his hemoglobin and hematocrit of come up  to 10.7 and 32. He reports marked improvement in his energy. He does say that he has problems with decreased energy and sleep although he feels like he gets a restful night sleep. He denies any snoring or apnea. He wakes up fairly early in the morning and does yoga and often takes a nap in the morning. Unfortunately his INR became subtherapeutic after holding his warfarin for coagulopathy. On the 20th it was 1.4. Today his INR is 1.8.  10/18/2015  Mr. [name] returns today for follow-up of 2 recent hospitalizations. He was seen at [facility] and subsequently at [facility] a couple weeks later for small bowel obstruction. He does have a history of gastric cancer and is status post subtotal gastrectomy in 2005. Unfortunately he has not been able to eat and has lost a significant amount of weight. He is currently 119 pounds with a BMI of 19 and does feel somewhat weak. Fortunately, however his LV function has improved back to 55-60% by echo. He was seen in consultation and no further cardiac workup was recommended as he did have some mildly elevated troponins. Systolic to be nonspecific. He's had no problems with his mechanical mitral valve and INR today is finally controlled at 3.5. He tells me he is interested in getting some dental work including having several teeth pulled and having a denture placed at [facility] in the near future.  PMHx:  Past Medical History:  Diagnosis Date  . BPH (benign prostatic hyperplasia)   . Chronic anticoagulation    coumadin for mechanical valves  . CKD (chronic kidney disease), stage III   . Diabetes mellitus type 2, diet-controlled ([place])   . Gastric cancer ([place]) 2005   s/p gastrectomy  . High cholesterol   . HOH (hard of hearing)   . Hypertension   . Rheumatic heart disease   . S/P MVR (mitral valve replacement) 2015   43mm StJude mechanical valve    Past Surgical History:  Procedure Laterality Date  . CARDIAC CATHETERIZATION N/A 05/10/2015   Procedure:  Right/Left Heart Cath and Coronary Angiography;  Surgeon: Burnell Blanks, MD;  Location: [facility];  Service: Cardiovascular;  Laterality: N/A;  . HERNIA REPAIR    . MITRAL VALVE REPLACEMENT  10/24/2013   37mm StJude mechanical valve  . PARTIAL GASTRECTOMY  2005    FAMHx:  Family History  Problem Relation Age of Onset  . Hypertension Mother   . Diabetes Mother   . Cancer Brother     SOCHx:   reports that he has never smoked. He has never used smokeless tobacco. He reports that he does not drink alcohol or use drugs.  ALLERGIES:  Allergies  Allergen Reactions  . Penicillins Other (See Comments)  . Vancomycin Other (See Comments)    Kidney problems    ROS: Pertinent items noted in HPI and remainder of comprehensive ROS otherwise negative.  HOME MEDS: Current Outpatient Prescriptions  Medication Sig Dispense Refill  . aspirin 81 MG chewable tablet Chew 81 mg by mouth daily.    Marland Kitchen atorvastatin (LIPITOR) 20 MG tablet Take 20 mg by mouth at bedtime.    . carvedilol (COREG) 6.25 MG tablet Take 1 tablet (  6.25 mg total) by mouth 2 (two) times daily with a meal. 60 tablet 3  . ferrous sulfate 325 (65 FE) MG tablet Take 1 tablet (325 mg total) by mouth 2 (two) times daily with a meal. 30 tablet 3  . furosemide (LASIX) 40 MG tablet Take 20 mg by mouth every other day.     . isosorbide mononitrate (IMDUR) 30 MG 24 hr tablet TAKE 1 TABLET (30 MG TOTAL) BY MOUTH DAILY. 30 tablet 1  . Multiple Vitamin (MULTIVITAMIN WITH MINERALS) TABS tablet Take 1 tablet by mouth daily.    . tamsulosin (FLOMAX) 0.4 MG CAPS capsule Take 1 capsule (0.4 mg total) by mouth daily after supper. 30 capsule 3  . Thiamine Mononitrate (VITAMIN B1 PO) Take 1 tablet by mouth daily.     . vitamin B-12 (CYANOCOBALAMIN) 1000 MCG tablet Take 1 tablet (1,000 mcg total) by mouth daily. 7 tablet 0  . warfarin (COUMADIN) 3 MG tablet Take 1 - 1.5 tablets daily or as directed by Coumadin clinic 105 tablet 0    No current facility-administered medications for this visit.     LABS/IMAGING: No results found for this or any previous visit (from the past 48 hour(s)). No results found.  WEIGHTS: Wt Readings from Last 3 Encounters:  10/18/15 119 lb 9.6 oz (54.3 kg)  09/03/15 119 lb 14.9 oz (54.4 kg)  06/03/15 124 lb 3.2 oz (56.3 kg)    VITALS: BP 136/72   Pulse (!) 56   Ht 5\' 6"  (1.676 m)   Wt 119 lb 9.6 oz (54.3 kg)   BMI 19.30 kg/m   EXAM: General appearance: alert, cachectic and no distress Neck: no carotid bruit and no JVD Lungs: clear to auscultation bilaterally Heart: regular rate and rhythm, S1, S2 normal and sharp mechanical valve sounds Abdomen: soft, non-tender; bowel sounds normal; no masses,  no organomegaly Extremities: extremities normal, atraumatic, no cyanosis or edema Pulses: 2+ and symmetric Skin: Skin color, texture, turgor normal. No rashes or lesions Neurologic: Grossly normal Psych: Pleasant  EKG: Sinus bradycardia 56, with deep inferior and lateral T-wave inversions  ASSESSMENT: 1. Acute systolic congestive heart failure-EF reduced to 45% 2. Mild to moderate nonobstructive coronary artery disease by cath 3. Normally functioning mechanical mitral valve 4. Warfarin coagulopathy 5. Acute on chronic kidney disease stage III 6. Medication noncompliance  PLAN: 1.   Mr. Greenslade is recovering from recurrent small bowel obstructions. He's probably nutritionally depleted and underweight. His EKG shows some abnormalities including deep inferior and lateral T-wave inversions. He's had these changes in the past and they may be related to electrolyte abnormalities. He denies any chest pain or worsening shortness of breath. LV function has normalized. He was noted to have mild to moderate nonobstructive coronary disease by his most recent heart cath. Would not pursue further workup unless he is more symptomatic.  Pixie Casino, MD, [facility] Attending Cardiologist [place] [facility] 10/18/2015, 10:34 AM

## 2015-10-18 NOTE — Patient Instructions (Addendum)
Your physician wants you to follow-up in: 6 months with Dr. Debara Pickett. You will receive a reminder letter in the mail two months in advance. If you don't receive a letter, please call our office to schedule the follow-up appointment.      Peripheral Vascular Disease Peripheral vascular disease (PVD) is a disease of the blood vessels that are not part of your heart and brain. A simple term for PVD is poor circulation. In most cases, PVD narrows the blood vessels that carry blood from your heart to the rest of your body. This can result in a decreased supply of blood to your arms, legs, and internal organs, like your stomach or kidneys. However, it most often affects a person's lower legs and feet. There are two types of PVD.  Organic PVD. This is the more common type. It is caused by damage to the structure of blood vessels.  Functional PVD. This is caused by conditions that make blood vessels contract and tighten (spasm). Without treatment, PVD tends to get worse over time. PVD can also lead to acute ischemic limb. This is when an arm or limb suddenly has trouble getting enough blood. This is a medical emergency.  HOME CARE  Take medicines only as told by your doctor.  Do not use any tobacco products, including cigarettes, chewing tobacco, or electronic cigarettes. If you need help quitting, ask your doctor.  Lose weight if you are overweight, and maintain a healthy weight as told by your doctor.  Eat a diet that is low in fat and cholesterol. If you need help, ask your doctor.  Exercise regularly. Ask your doctor for some good activities for you.  Take good care of your feet.  Wear comfortable shoes that fit well.  Check your feet often for any cuts or sores. GET HELP IF:  You have cramps in your legs while walking.  You have leg pain when you are at rest.  You have coldness in a leg or foot.  Your skin changes.  You are unable to get or have an erection (erectile  dysfunction).  You have cuts or sores on your feet that are not healing. GET HELP RIGHT AWAY IF:  Your arm or leg turns cold and blue.  Your arms or legs become red, warm, swollen, painful, or numb.  You have chest pain or trouble breathing.  You suddenly have weakness in your face, arm, or leg.  You become very confused or you cannot speak.  You suddenly have a very bad headache.  You suddenly cannot see.   This information is not intended to replace advice given to you by your health care provider. Make sure you discuss any questions you have with your health care provider.   Document Released: 05/24/2009 Document Revised: 03/20/2014 Document Reviewed: 08/07/2013 Elsevier Interactive Patient Education [employer].

## 2015-10-21 LAB — BASIC METABOLIC PANEL
BUN: 18 mg/dL (ref 7–25)
CALCIUM: 8 mg/dL — AB (ref 8.6–10.3)
CO2: 24 mmol/L (ref 20–31)
Chloride: 107 mmol/L (ref 98–110)
Creat: 1.67 mg/dL — ABNORMAL HIGH (ref 0.70–1.18)
Glucose, Bld: 196 mg/dL — ABNORMAL HIGH (ref 65–99)
POTASSIUM: 4.6 mmol/L (ref 3.5–5.3)
SODIUM: 141 mmol/L (ref 135–146)

## 2015-11-02 ENCOUNTER — Ambulatory Visit: Payer: BC Managed Care – PPO | Admitting: Internal Medicine

## 2015-11-09 ENCOUNTER — Ambulatory Visit (INDEPENDENT_AMBULATORY_CARE_PROVIDER_SITE_OTHER): Payer: BC Managed Care – PPO | Admitting: Pharmacist Clinician (PhC)/ Clinical Pharmacy Specialist

## 2015-11-09 DIAGNOSIS — Z952 Presence of prosthetic heart valve: Secondary | ICD-10-CM

## 2015-11-09 DIAGNOSIS — Z7901 Long term (current) use of anticoagulants: Secondary | ICD-10-CM | POA: Diagnosis not present

## 2015-11-09 DIAGNOSIS — Z954 Presence of other heart-valve replacement: Secondary | ICD-10-CM

## 2015-11-09 LAB — POCT INR: INR: 3.1

## 2015-11-28 ENCOUNTER — Other Ambulatory Visit: Payer: Self-pay | Admitting: Internal Medicine

## 2015-11-29 ENCOUNTER — Other Ambulatory Visit: Payer: Self-pay

## 2015-12-07 ENCOUNTER — Ambulatory Visit (INDEPENDENT_AMBULATORY_CARE_PROVIDER_SITE_OTHER): Payer: BC Managed Care – PPO | Admitting: Pharmacist

## 2015-12-07 DIAGNOSIS — Z954 Presence of other heart-valve replacement: Secondary | ICD-10-CM | POA: Diagnosis not present

## 2015-12-07 DIAGNOSIS — Z7901 Long term (current) use of anticoagulants: Secondary | ICD-10-CM | POA: Diagnosis not present

## 2015-12-07 DIAGNOSIS — Z952 Presence of prosthetic heart valve: Secondary | ICD-10-CM

## 2015-12-07 LAB — POCT INR: INR: 3.1

## 2015-12-18 ENCOUNTER — Other Ambulatory Visit: Payer: Self-pay | Admitting: Internal Medicine

## 2015-12-20 ENCOUNTER — Encounter (HOSPITAL_BASED_OUTPATIENT_CLINIC_OR_DEPARTMENT_OTHER): Payer: Self-pay | Admitting: *Deleted

## 2015-12-20 ENCOUNTER — Emergency Department (HOSPITAL_BASED_OUTPATIENT_CLINIC_OR_DEPARTMENT_OTHER): Payer: BC Managed Care – PPO

## 2015-12-20 ENCOUNTER — Emergency Department (HOSPITAL_BASED_OUTPATIENT_CLINIC_OR_DEPARTMENT_OTHER)
Admission: EM | Admit: 2015-12-20 | Discharge: 2015-12-20 | Disposition: A | Discharge status: Home / Self Care | Payer: BC Managed Care – PPO | Attending: Emergency Medicine | Admitting: Emergency Medicine

## 2015-12-20 DIAGNOSIS — I130 Hypertensive heart and chronic kidney disease with heart failure and stage 1 through stage 4 chronic kidney disease, or unspecified chronic kidney disease: Secondary | ICD-10-CM | POA: Diagnosis not present

## 2015-12-20 DIAGNOSIS — Z7982 Long term (current) use of aspirin: Secondary | ICD-10-CM | POA: Diagnosis not present

## 2015-12-20 DIAGNOSIS — N183 Chronic kidney disease, stage 3 (moderate): Secondary | ICD-10-CM | POA: Insufficient documentation

## 2015-12-20 DIAGNOSIS — E1122 Type 2 diabetes mellitus with diabetic chronic kidney disease: Secondary | ICD-10-CM | POA: Diagnosis not present

## 2015-12-20 DIAGNOSIS — I2510 Atherosclerotic heart disease of native coronary artery without angina pectoris: Secondary | ICD-10-CM | POA: Diagnosis not present

## 2015-12-20 DIAGNOSIS — Y999 Unspecified external cause status: Secondary | ICD-10-CM | POA: Insufficient documentation

## 2015-12-20 DIAGNOSIS — S6991XA Unspecified injury of right wrist, hand and finger(s), initial encounter: Secondary | ICD-10-CM | POA: Diagnosis present

## 2015-12-20 DIAGNOSIS — S6710XA Crushing injury of unspecified finger(s), initial encounter: Secondary | ICD-10-CM

## 2015-12-20 DIAGNOSIS — W230XXA Caught, crushed, jammed, or pinched between moving objects, initial encounter: Secondary | ICD-10-CM | POA: Insufficient documentation

## 2015-12-20 DIAGNOSIS — Z85020 Personal history of malignant carcinoid tumor of stomach: Secondary | ICD-10-CM | POA: Insufficient documentation

## 2015-12-20 DIAGNOSIS — S67190A Crushing injury of right index finger, initial encounter: Secondary | ICD-10-CM | POA: Diagnosis not present

## 2015-12-20 DIAGNOSIS — I509 Heart failure, unspecified: Secondary | ICD-10-CM | POA: Insufficient documentation

## 2015-12-20 DIAGNOSIS — Z7901 Long term (current) use of anticoagulants: Secondary | ICD-10-CM

## 2015-12-20 DIAGNOSIS — Y939 Activity, unspecified: Secondary | ICD-10-CM | POA: Insufficient documentation

## 2015-12-20 DIAGNOSIS — Y929 Unspecified place or not applicable: Secondary | ICD-10-CM | POA: Insufficient documentation

## 2015-12-20 DIAGNOSIS — Z79899 Other long term (current) drug therapy: Secondary | ICD-10-CM | POA: Diagnosis not present

## 2015-12-20 LAB — PROTIME-INR
INR: 4.26 — AB
Prothrombin Time: 42.1 seconds — ABNORMAL HIGH (ref 11.4–15.2)

## 2015-12-20 MED ORDER — ACETAMINOPHEN 500 MG PO TABS
1000.0000 mg | ORAL_TABLET | Freq: Once | ORAL | Status: DC
  Filled 2015-12-20: qty 2

## 2015-12-20 NOTE — Telephone Encounter (Signed)
Decreased dose to 20 mg daily - had worsening renal failure on 40 mg daily dose

## 2015-12-20 NOTE — ED Triage Notes (Signed)
His right index finger got crushed in the car door. He is on blood thinners and has not been able to stop the bleeding. Bleeding is controlled with pressure dressing in place on arrival to triage.

## 2015-12-20 NOTE — ED Notes (Signed)
Dr. Stark Jock informed of inr. 4.26

## 2015-12-20 NOTE — Discharge Instructions (Signed)
Hold your Coumadin for the next 2 days, then resume as before. Please follow-up with your doctor in approximately one week for a recheck of your INR.  Local wound care with bacitracin and dressing changes at home twice daily.  Return to the emergency department if your bleeding worsens or you develop other new and concerning symptoms.

## 2015-12-20 NOTE — ED Notes (Signed)
Pt verbalizes understanding of d/c instructions and denies any further needs at this time. 

## 2015-12-20 NOTE — ED Provider Notes (Signed)
[facility] DEPT [facility] Provider Note   CSN: [id number] Arrival date & time: 12/20/15  1640  By signing my name below, I, Robert [name], attest that this documentation has been prepared under the direction and in the presence of Robert Speak, MD . Electronically Signed: Neta [name], ED Scribe. 12/20/2015. 5:33 PM.    History   Chief Complaint Chief Complaint  Patient presents with  . Finger Injury    The history is provided by the patient. No language interpreter was used.   HPI Comments:  Robert [name] is a 75 y.o. male who presents to the Emergency Department s/p an injury to his right index finger that occurred at ~1PM today. Pt states that he slammed his finger in a car door. Pt put a dressing on the injury to try to control the bleeding, then went to urgent care, and was referred to the ED. Pt takes coumadin. No alleviating factors noted. Pt denies other associated symptoms.   Past Medical History:  Diagnosis Date  . BPH (benign prostatic hyperplasia)   . Chronic anticoagulation    coumadin for mechanical valves  . CKD (chronic kidney disease), stage III   . Diabetes mellitus type 2, diet-controlled ([place])   . Gastric cancer ([place]) 2005   s/p gastrectomy  . High cholesterol   . HOH (hard of hearing)   . Hypertension   . Rheumatic heart disease   . S/P MVR (mitral valve replacement) 2015   11mm StJude mechanical valve    Patient Active Problem List   Diagnosis Date Noted  . Abnormal EKG 10/18/2015  . AKI (acute kidney injury) ([place])   . BPH (benign prostatic hyperplasia) 08/29/2015  . Elevated troponin 08/29/2015  . SBO (small bowel obstruction) 08/29/2015  . Pancreatitis 08/29/2015  . Protein-calorie malnutrition, severe ([place]) 08/29/2015  . HLD (hyperlipidemia) 08/29/2015  . Noncompliance with medications 05/26/2015  . Long term (current) use of anticoagulants [Z79.01] 05/18/2015  . Coronary artery disease involving native coronary artery of native  heart without angina pectoris   . Chronic systolic (congestive) heart failure 05/06/2015  . Hypertensive urgency 05/06/2015  . CKD (chronic kidney disease) stage 3, GFR 30-59 ml/min 05/06/2015  . Chronic anemia 05/06/2015  . H/O mitral valve replacement with mechanical valve 05/06/2015  . CHF (congestive heart failure) ([place]) 05/06/2015    Past Surgical History:  Procedure Laterality Date  . CARDIAC CATHETERIZATION N/A 05/10/2015   Procedure: Right/Left Heart Cath and Coronary Angiography;  Surgeon: Burnell Blanks, MD;  Location: [facility];  Service: Cardiovascular;  Laterality: N/A;  . HERNIA REPAIR    . MITRAL VALVE REPLACEMENT  10/24/2013   60mm StJude mechanical valve  . PARTIAL GASTRECTOMY  2005       Home Medications    Prior to Admission medications   Medication Sig Start Date End Date Taking? Authorizing Provider  aspirin 81 MG chewable tablet Chew 81 mg by mouth daily.    Historical Provider, MD  atorvastatin (LIPITOR) 20 MG tablet Take 20 mg by mouth at bedtime.    Historical Provider, MD  carvedilol (COREG) 6.25 MG tablet TAKE 1 TABLET BY MOUTH TWICE A DAY WITH A MEAL 12/20/15   Pixie Casino, MD  ferrous sulfate 325 (65 FE) MG tablet Take 1 tablet (325 mg total) by mouth 2 (two) times daily with a meal. 05/13/15   Belkys A Regalado, MD  furosemide (LASIX) 20 MG tablet Take 1 tablet (20 mg total) by mouth daily. 12/20/15  Pixie Casino, MD  furosemide (LASIX) 40 MG tablet Take 20 mg by mouth every other day.  09/28/15   Historical Provider, MD  isosorbide mononitrate (IMDUR) 30 MG 24 hr tablet TAKE 1 TABLET (30 MG TOTAL) BY MOUTH DAILY. 10/18/15   Pixie Casino, MD  isosorbide mononitrate (IMDUR) 30 MG 24 hr tablet TAKE 1 TABLET BY MOUTH EVERY DAY 11/29/15   Pixie Casino, MD  Multiple Vitamin (MULTIVITAMIN WITH MINERALS) TABS tablet Take 1 tablet by mouth daily.    Historical Provider, MD  tamsulosin (FLOMAX) 0.4 MG CAPS capsule Take 1 capsule (0.4 mg  total) by mouth daily after supper. 06/10/15   Pixie Casino, MD  Thiamine Mononitrate (VITAMIN B1 PO) Take 1 tablet by mouth daily.     Historical Provider, MD  vitamin B-12 (CYANOCOBALAMIN) 1000 MCG tablet Take 1 tablet (1,000 mcg total) by mouth daily. 05/13/15   Belkys A Regalado, MD  warfarin (COUMADIN) 3 MG tablet Take 1 - 1.5 tablets daily or as directed by Coumadin clinic 10/05/15   Pixie Casino, MD    Family History Family History  Problem Relation Age of Onset  . Hypertension Mother   . Diabetes Mother   . Cancer Brother     Social History Social History  Substance Use Topics  . Smoking status: Never Smoker  . Smokeless tobacco: Never Used  . Alcohol use No     Allergies   Penicillins and Vancomycin   Review of Systems Review of Systems  Skin: Positive for wound.  Neurological: Negative for numbness.  All other systems reviewed and are negative.    Physical Exam Updated Vital Signs BP 198/82   Pulse 61   Temp 97.5 F (36.4 C) (Oral)   Resp 18   Ht 5\' 6"  (1.676 m)   Wt 120 lb (54.4 kg)   SpO2 97%   BMI 19.37 kg/m   Physical Exam  Constitutional: He appears well-developed and well-nourished. No distress.  HENT:  Head: Normocephalic and atraumatic.  Eyes: Conjunctivae are normal.  Cardiovascular: Normal rate.   Pulmonary/Chest: Effort normal.  Abdominal: He exhibits no distension.  Neurological: He is alert.  Skin: Skin is warm and dry.  Right index finger is noted to have an evulsion of skin at the cuticle at the base of the nail. There is some bleeding from under the nail, however the nail is firmly in place.   Psychiatric: He has a normal mood and affect.  Nursing note and vitals reviewed.    ED Treatments / Results  DIAGNOSTIC STUDIES:  Oxygen Saturation is 97% on RA, normal by my interpretation.    COORDINATION OF CARE:  5:29 PM Discussed treatment plan with pt at bedside and pt agreed to plan.   Labs (all labs ordered are  listed, but only abnormal results are displayed) Labs Reviewed - No data to display  EKG  EKG Interpretation None       Radiology Dg Hand Complete Right  Result Date: 12/20/2015 CLINICAL DATA:  Initial evaluation for acute trauma, slammed finger in car door. EXAM: RIGHT HAND - COMPLETE 3+ VIEW COMPARISON:  None. FINDINGS: Bandaging material overlies the distal aspect of the right second digit. Underlying soft tissue swelling is suspected. No acute fracture or dislocation. Scattered degenerative osteoarthritic changes noted within the hand. Osseous mineralization normal. No other soft tissue abnormality. IMPRESSION: 1. Soft tissue swelling/injury at the distal aspect of the right second digit. 2. No acute fracture or dislocation. Electronically Signed  By: Jeannine Boga M.D.   On: 12/20/2015 17:28    Procedures Procedures (including critical care time)  Medications Ordered in ED Medications - No data to display   Initial Impression / Assessment and Plan / ED Course  I have reviewed the triage vital signs and the nursing notes.  Pertinent labs & imaging results that were available during my care of the patient were reviewed by me and considered in my medical decision making (see chart for details).  Clinical Course    Patient presents with complaints of bleeding from a finger laceration sustained when his wife closed the car door on his finger. He is taking Coumadin and is having difficulty getting the bleeding stopped. He has a slow trickle of blood from under the fingernail and from the superficial skin which was avulsed from the cuticle. The nail bed is firmly in place. A quick clot dressing was placed and bleeding was ultimately controlled. His INR is 4.3 and he was advised to hold his Coumadin for the next 2 days.  The dressing will remain in place and I have advised him to change this twice daily. To return as needed for any problems.  Final Clinical Impressions(s) / ED  Diagnoses   Final diagnoses:  None    New Prescriptions New Prescriptions   No medications on file  I personally performed the services described in this documentation, which was scribed in my presence. The recorded information has been reviewed and is accurate.        Robert Speak, MD 12/20/15 2249

## 2015-12-21 ENCOUNTER — Telehealth: Payer: Self-pay | Admitting: Internal Medicine

## 2015-12-21 ENCOUNTER — Ambulatory Visit (INDEPENDENT_AMBULATORY_CARE_PROVIDER_SITE_OTHER): Payer: BC Managed Care – PPO | Admitting: Pharmacist

## 2015-12-21 DIAGNOSIS — Z7901 Long term (current) use of anticoagulants: Secondary | ICD-10-CM

## 2015-12-21 DIAGNOSIS — Z952 Presence of prosthetic heart valve: Secondary | ICD-10-CM

## 2015-12-21 LAB — POCT INR: INR: 3.3

## 2015-12-21 NOTE — Telephone Encounter (Signed)
New message   Pt wife verbalized that she wants to speak to rn because pt got his hand slammed in car door and the pt would not stop bleeding it would not stop for 4 or 5 hours

## 2015-12-21 NOTE — Telephone Encounter (Signed)
Spoke with wife, she states he skipped warfarin yesterday after ER visit,

## 2015-12-21 NOTE — Telephone Encounter (Signed)
Had bleeding continue on finger thru evening.  Today she believes it has stopped, it is heavily bandaged.    They will go to Coumadin clinic this afternoon for repeat INR check.  Georgina Peer aware

## 2015-12-21 NOTE — Telephone Encounter (Signed)
Returned call. Spoke to wife. She notes patient was seen in ED for hand trauma yesterday, also noted continual bleed and the ED physician recommended he return to have his INR checked. Note that the INR was elevated yesterday in ED, but no recommendation to adjust warfarin. Wife aware I will route to pharmD to review and give dose recommendations & set up return clinic visit.

## 2016-01-04 ENCOUNTER — Ambulatory Visit (INDEPENDENT_AMBULATORY_CARE_PROVIDER_SITE_OTHER): Payer: BC Managed Care – PPO | Admitting: Pharmacist Clinician (PhC)/ Clinical Pharmacy Specialist

## 2016-01-04 DIAGNOSIS — Z952 Presence of prosthetic heart valve: Secondary | ICD-10-CM

## 2016-01-04 DIAGNOSIS — Z7901 Long term (current) use of anticoagulants: Secondary | ICD-10-CM | POA: Diagnosis not present

## 2016-01-04 LAB — POCT INR: INR: 4.2

## 2016-01-18 ENCOUNTER — Ambulatory Visit (INDEPENDENT_AMBULATORY_CARE_PROVIDER_SITE_OTHER): Payer: BC Managed Care – PPO | Admitting: Pharmacist

## 2016-01-18 DIAGNOSIS — Z952 Presence of prosthetic heart valve: Secondary | ICD-10-CM

## 2016-01-18 DIAGNOSIS — Z7901 Long term (current) use of anticoagulants: Secondary | ICD-10-CM | POA: Diagnosis not present

## 2016-01-18 LAB — POCT INR: INR: 3.5

## 2016-02-01 ENCOUNTER — Other Ambulatory Visit: Payer: Self-pay

## 2016-02-08 ENCOUNTER — Ambulatory Visit (INDEPENDENT_AMBULATORY_CARE_PROVIDER_SITE_OTHER): Payer: BC Managed Care – PPO | Admitting: Pharmacist Clinician (PhC)/ Clinical Pharmacy Specialist

## 2016-02-08 DIAGNOSIS — Z952 Presence of prosthetic heart valve: Secondary | ICD-10-CM

## 2016-02-08 DIAGNOSIS — Z7901 Long term (current) use of anticoagulants: Secondary | ICD-10-CM | POA: Diagnosis not present

## 2016-02-08 LAB — POCT INR: INR: 3.5

## 2016-02-08 MED ORDER — WARFARIN SODIUM 3 MG PO TABS: ORAL_TABLET | ORAL | 1 refills | Status: DC

## 2016-03-09 ENCOUNTER — Ambulatory Visit (INDEPENDENT_AMBULATORY_CARE_PROVIDER_SITE_OTHER): Payer: BC Managed Care – PPO | Admitting: Pharmacist

## 2016-03-09 DIAGNOSIS — Z952 Presence of prosthetic heart valve: Secondary | ICD-10-CM | POA: Diagnosis not present

## 2016-03-09 DIAGNOSIS — Z7901 Long term (current) use of anticoagulants: Secondary | ICD-10-CM

## 2016-03-09 LAB — POCT INR: INR: 3.4

## 2016-04-07 ENCOUNTER — Ambulatory Visit (INDEPENDENT_AMBULATORY_CARE_PROVIDER_SITE_OTHER): Payer: BC Managed Care – PPO | Admitting: Internal Medicine

## 2016-04-07 ENCOUNTER — Ambulatory Visit: Payer: BC Managed Care – PPO | Admitting: Internal Medicine

## 2016-04-07 ENCOUNTER — Ambulatory Visit (INDEPENDENT_AMBULATORY_CARE_PROVIDER_SITE_OTHER): Payer: BC Managed Care – PPO | Admitting: Pharmacist Clinician (PhC)/ Clinical Pharmacy Specialist

## 2016-04-07 ENCOUNTER — Encounter: Payer: Self-pay | Admitting: Internal Medicine

## 2016-04-07 VITALS — BP 140/80 | HR 54 | Ht 66.0 in | Wt 131.6 lb

## 2016-04-07 DIAGNOSIS — Z952 Presence of prosthetic heart valve: Secondary | ICD-10-CM

## 2016-04-07 DIAGNOSIS — Z7901 Long term (current) use of anticoagulants: Secondary | ICD-10-CM | POA: Diagnosis not present

## 2016-04-07 DIAGNOSIS — N183 Chronic kidney disease, stage 3 unspecified: Secondary | ICD-10-CM

## 2016-04-07 DIAGNOSIS — R0609 Other forms of dyspnea: Secondary | ICD-10-CM | POA: Diagnosis not present

## 2016-04-07 DIAGNOSIS — I2510 Atherosclerotic heart disease of native coronary artery without angina pectoris: Secondary | ICD-10-CM | POA: Diagnosis not present

## 2016-04-07 LAB — POCT INR: INR: 3.7

## 2016-04-07 MED ORDER — ATORVASTATIN CALCIUM 20 MG PO TABS: 20.0000 mg | ORAL_TABLET | Freq: Every day | ORAL | 3 refills | Status: DC

## 2016-04-07 MED ORDER — CARVEDILOL 6.25 MG PO TABS: ORAL_TABLET | ORAL | 3 refills | Status: DC

## 2016-04-07 MED ORDER — ISOSORBIDE MONONITRATE ER 30 MG PO TB24: 30.0000 mg | ORAL_TABLET | Freq: Every day | ORAL | 3 refills | Status: DC

## 2016-04-07 MED ORDER — FUROSEMIDE 20 MG PO TABS: 20.0000 mg | ORAL_TABLET | ORAL | 3 refills | Status: DC

## 2016-04-07 NOTE — Progress Notes (Signed)
OFFICE NOTE  Chief Complaint:  Progressive DOE  Primary Care Physician: Burman Freestone, MD  HPI:  Robert [name] is a 76 y.o. 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 He has been followed by Dr. Geraldo Pitter at [facility] cardiology and was referred to Dr. Evelina Dun at [facility] in 2013 for evaluation of his mitral stenosis. He was followed clinically and underwent mechanical mitral valve replacement in 2015. Since then he's been compliant on warfarin and has had therapeutic to supratherapeutic INRs. LV function was normal prior to surgery however we cannot find echocardiographic results after his surgery. A heart catheterization performed prior to surgery demonstrated a 70% diagonal stenosis and 55% LAD stenosis, but he did not have any bypass surgery. He now presents with one week of progressive shortness of breath, orthopnea and leg edema concerning for heart failure. He seems to be responding to IV Lasix. Chest x-ray shows COPD changes, small bilateral pleural effusions and some basilar atelectasis. Labs indicate markedly elevated BNP of 1406 and troponin is negative 3. There appears to be a mild iron deficiency anemia with H&H of 9 and 29. INR initially was 4.37 that came down to 3.98. He was admitted and treated for heart failure with diuresis. He underwent right and left heart catheterization which demonstrated the following:   Prox RCA lesion, 20% stenosed.  Prox LAD to Mid LAD lesion, 30% stenosed.  Mid LAD lesion, 40% stenosed.  Ost 1st Diag to 1st Diag lesion, 50% stenosed.  Ost 2nd Diag lesion, 30% stenosed.  1. Mild to moderate non-obstructive CAD 2. Normal movement of mechanical mitral valve leaflets.  3. Elevated filling pressures suggesting residual volume overload.   He also had an echocardiogram which showed a newly reduced LVEF of 45-50%. At  discharge he was scheduled to be on Lasix 60 mg daily, and increase from his prior home dose of 40 mg daily. It was also recommended that he discontinue valsartan and spironolactone. After discharge he had routine lab work which demonstrated acute renal failure and creatinine had increased from 1.8-2.9. He was also markedly hyperkalemic at 5.7. This was brought to the attention of the [facility] DOD Dr. Marlou Porch, who recommended hydration and avoiding potassium with a repeat metabolic profile the next day. The repeat metabolic profile showed creatinine had decreased to 2.7 and potassium was 5.3. Since discharge Robert [name] had poor appetite, weight has remained stable and he is felt weak and had poor energy. Much worse than when he was discharged. I spent a good deal of time reviewing the patient's medications with his wife who had a 3 x 5 note card with his previous medications on the front and new medications on the rear. She tells me that he is actually taking all of these medications, therefore he is taking his old dose of Lasix 40 mg in addition to Lasix 60 mg daily as well as spironolactone and valsartan. Certainly explain his acute renal failure as well as hyperkalemia. I reviewed his discharge medication summary which clearly states to discontinue valsartan and Lasix and noted the dose changes of medications. He was seen in his primary care doctor's office after discharge however it appears that these medication changes were not identified. In addition, his INR is markedly elevated today at 6.2. This is up from 3.5 where it was 1 week ago. I suspect again this is related to renal failure, decreased appetite and  nutritional reasons. Medication noncompliance could be playing a role.  Robert [name] returns today for follow-up. Since we sorted out his medications he's done much better. His creatinine has gone back down to baseline of 1.8. He is on iron and B12 and his hemoglobin and hematocrit of come up to 10.7 and  32. He reports marked improvement in his energy. He does say that he has problems with decreased energy and sleep although he feels like he gets a restful night sleep. He denies any snoring or apnea. He wakes up fairly early in the morning and does yoga and often takes a nap in the morning. Unfortunately his INR became subtherapeutic after holding his warfarin for coagulopathy. On the 20th it was 1.4. Today his INR is 1.8.  10/18/2015  Robert [name] returns today for follow-up of 2 recent hospitalizations. He was seen at [facility] and subsequently at [facility] a couple weeks later for small bowel obstruction. He does have a history of gastric cancer and is status post subtotal gastrectomy in 2005. Unfortunately he has not been able to eat and has lost a significant amount of weight. He is currently 119 pounds with a BMI of 19 and does feel somewhat weak. Fortunately, however his LV function has improved back to 55-60% by echo. He was seen in consultation and no further cardiac workup was recommended as he did have some mildly elevated troponins. Systolic to be nonspecific. He's had no problems with his mechanical mitral valve and INR today is finally controlled at 3.5. He tells me he is interested in getting some dental work including having several teeth pulled and having a denture placed at [facility] in the near future.  04/07/2016  Robert [name] presents today with some recent dyspnea on exertion. He says over the past 2 weeks she's become short of breath walking up stairs and now walking on a flat surface. EF had improved recently up to 55-60% by echo, however this was during hospitalization for a small bowel obstruction. At the time he did have elevated troponins but was felt due to a noncardiac etiology. He denies any chest pain however his progressive dyspnea and fatigue with exertion could be due to coronary ischemia. He was noted to have moderate nonobstructive coronary disease by cath more than a  year ago.  PMHx:  Past Medical History:  Diagnosis Date  . BPH (benign prostatic hyperplasia)   . Chronic anticoagulation    coumadin for mechanical valves  . CKD (chronic kidney disease), stage III   . Diabetes mellitus type 2, diet-controlled ([place])   . Gastric cancer ([place]) 2005   s/p gastrectomy  . High cholesterol   . HOH (hard of hearing)   . Hypertension   . Rheumatic heart disease   . S/P MVR (mitral valve replacement) 2015   49mm StJude mechanical valve    Past Surgical History:  Procedure Laterality Date  . CARDIAC CATHETERIZATION N/A 05/10/2015   Procedure: Right/Left Heart Cath and Coronary Angiography;  Surgeon: Burnell Blanks, MD;  Location: [place] CV LAB;  Service: Cardiovascular;  Laterality: N/A;  . HERNIA REPAIR    . MITRAL VALVE REPLACEMENT  10/24/2013   44mm StJude mechanical valve  . PARTIAL GASTRECTOMY  2005    FAMHx:  Family History  Problem Relation Age of Onset  . Hypertension Mother   . Diabetes Mother   . Cancer Brother     SOCHx:   reports that he has never smoked. He has never used  smokeless tobacco. He reports that he does not drink alcohol or use drugs.  ALLERGIES:  Allergies  Allergen Reactions  . Penicillins Other (See Comments)  . Vancomycin Other (See Comments)    Kidney problems    ROS: Pertinent items noted in HPI and remainder of comprehensive ROS otherwise negative.  HOME MEDS: Current Outpatient Prescriptions  Medication Sig Dispense Refill  . aspirin 81 MG chewable tablet Chew 81 mg by mouth daily.    Marland Kitchen atorvastatin (LIPITOR) 20 MG tablet Take 1 tablet (20 mg total) by mouth at bedtime. 90 tablet 3  . carvedilol (COREG) 6.25 MG tablet TAKE 1 TABLET BY MOUTH TWICE A DAY WITH A MEAL 180 tablet 3  . ferrous sulfate 325 (65 FE) MG tablet Take 1 tablet (325 mg total) by mouth 2 (two) times daily with a meal. 30 tablet 3  . furosemide (LASIX) 20 MG tablet Take 1 tablet (20 mg total) by mouth every other day. 45  tablet 3  . isosorbide mononitrate (IMDUR) 30 MG 24 hr tablet Take 1 tablet (30 mg total) by mouth daily. 90 tablet 3  . Multiple Vitamin (MULTIVITAMIN WITH MINERALS) TABS tablet Take 1 tablet by mouth daily.    . tamsulosin (FLOMAX) 0.4 MG CAPS capsule Take 1 capsule (0.4 mg total) by mouth daily after supper. 30 capsule 3  . Thiamine Mononitrate (VITAMIN B1 PO) Take 1 tablet by mouth daily.     . vitamin B-12 (CYANOCOBALAMIN) 1000 MCG tablet Take 1 tablet (1,000 mcg total) by mouth daily. 7 tablet 0  . warfarin (COUMADIN) 3 MG tablet Take 1 - 1.5 tablets daily or as directed by Coumadin clinic 105 tablet 1   No current facility-administered medications for this visit.     LABS/IMAGING: No results found for this or any previous visit (from the past 48 hour(s)). No results found.  WEIGHTS: Wt Readings from Last 3 Encounters:  04/07/16 131 lb 9.6 oz (59.7 kg)  12/20/15 120 lb (54.4 kg)  10/18/15 119 lb 9.6 oz (54.3 kg)    VITALS: BP 140/80   Pulse (!) 54   Ht 5\' 6"  (1.676 m)   Wt 131 lb 9.6 oz (59.7 kg)   BMI 21.24 kg/m   EXAM: General appearance: alert, cachectic and no distress Neck: no carotid bruit and no JVD Lungs: clear to auscultation bilaterally Heart: regular rate and rhythm, S1, S2 normal and sharp mechanical valve sounds Abdomen: soft, non-tender; bowel sounds normal; no masses,  no organomegaly Extremities: extremities normal, atraumatic, no cyanosis or edema Pulses: 2+ and symmetric Skin: Skin color, texture, turgor normal. No rashes or lesions Neurologic: Grossly normal Psych: Pleasant  EKG: Sinus bradycardia 56, with inferior and high lateral TWI's  ASSESSMENT: 1. Progressive DOE 2. Acute systolic congestive heart failure-EF reduced to 45%, now up to 55-60% (2017) 3. Mild to moderate nonobstructive coronary artery disease by cath 4. Normally functioning mechanical mitral valve 5. Warfarin coagulopathy 6. Acute on chronic kidney disease stage  III 7. Medication noncompliance  PLAN: 1.   Robert [name] is now describing progressive dyspnea on exertion mostly when walking up stairs and now even with flat surfaces over the past 2 weeks. Exercise tolerance has been decreased. Weight is up from 119-1 and 31 pounds. He says he is eating better and I do not detect any signs of heart failure particularly his lungs are clear and he has no lower extremity swelling. He is taking Lasix every other day. Recently his echo showed an improvement in  LVEF up to 55-60%. I suspect this could be progressive coronary artery disease. I like for him to undergo a nuclear stress test. Further medication changes or additional workup including possible cardiac catheterization based on those findings. Follow-up with me already scheduled on February 6.  Pixie Casino, MD, [facility] Attending Cardiologist [place] 04/07/2016, 1:58 PM

## 2016-04-07 NOTE — Patient Instructions (Signed)
Your physician has requested that you have a lexiscan myoview - stress test. For further information please visit [email or web address]. Please follow instruction sheet, as given.  Your physician recommends that you schedule a follow-up appointment as scheduled on Apr 18, 2016

## 2016-04-11 ENCOUNTER — Telehealth (HOSPITAL_COMMUNITY): Payer: Self-pay

## 2016-04-11 NOTE — Telephone Encounter (Signed)
Encounter complete. 

## 2016-04-13 ENCOUNTER — Ambulatory Visit (HOSPITAL_COMMUNITY)
Admission: RE | Admit: 2016-04-13 | Discharge: 2016-04-13 | Disposition: A | Discharge status: Home / Self Care | Payer: BC Managed Care – PPO | Source: Ambulatory Visit | Attending: Cardiovascular Disease | Admitting: Cardiovascular Disease

## 2016-04-13 DIAGNOSIS — I272 Pulmonary hypertension, unspecified: Secondary | ICD-10-CM | POA: Insufficient documentation

## 2016-04-13 DIAGNOSIS — R0609 Other forms of dyspnea: Secondary | ICD-10-CM

## 2016-04-13 DIAGNOSIS — I130 Hypertensive heart and chronic kidney disease with heart failure and stage 1 through stage 4 chronic kidney disease, or unspecified chronic kidney disease: Secondary | ICD-10-CM | POA: Diagnosis not present

## 2016-04-13 DIAGNOSIS — I2510 Atherosclerotic heart disease of native coronary artery without angina pectoris: Secondary | ICD-10-CM | POA: Insufficient documentation

## 2016-04-13 DIAGNOSIS — I050 Rheumatic mitral stenosis: Secondary | ICD-10-CM | POA: Insufficient documentation

## 2016-04-13 DIAGNOSIS — R001 Bradycardia, unspecified: Secondary | ICD-10-CM | POA: Diagnosis not present

## 2016-04-13 DIAGNOSIS — I509 Heart failure, unspecified: Secondary | ICD-10-CM | POA: Insufficient documentation

## 2016-04-13 LAB — MYOCARDIAL PERFUSION IMAGING
LV dias vol: 104 mL (ref 62–150)
LV sys vol: 57 mL
Peak HR: 63 {beats}/min
Rest HR: 56 {beats}/min
SDS: 4
SRS: 7
SSS: 10
TID: 1.13

## 2016-04-13 MED ORDER — TECHNETIUM TC 99M TETROFOSMIN IV KIT
9.8000 | PACK | Freq: Once | INTRAVENOUS | Status: AC | PRN
  Administered 2016-04-13: 9.8 via INTRAVENOUS
  Filled 2016-04-13: qty 10

## 2016-04-13 MED ORDER — TECHNETIUM TC 99M TETROFOSMIN IV KIT
31.2000 | PACK | Freq: Once | INTRAVENOUS | Status: AC | PRN
  Administered 2016-04-13: 31.2 via INTRAVENOUS
  Filled 2016-04-13: qty 32

## 2016-04-13 MED ORDER — REGADENOSON 0.4 MG/5ML IV SOLN
0.4000 mg | Freq: Once | INTRAVENOUS | Status: AC
  Administered 2016-04-13: 0.4 mg via INTRAVENOUS

## 2016-04-18 ENCOUNTER — Ambulatory Visit (INDEPENDENT_AMBULATORY_CARE_PROVIDER_SITE_OTHER): Payer: BC Managed Care – PPO | Admitting: Pharmacist

## 2016-04-18 ENCOUNTER — Ambulatory Visit (INDEPENDENT_AMBULATORY_CARE_PROVIDER_SITE_OTHER): Payer: BC Managed Care – PPO | Admitting: Internal Medicine

## 2016-04-18 ENCOUNTER — Encounter: Payer: Self-pay | Admitting: Internal Medicine

## 2016-04-18 VITALS — BP 138/90 | HR 56 | Ht 66.0 in | Wt 138.0 lb

## 2016-04-18 DIAGNOSIS — Z79899 Other long term (current) drug therapy: Secondary | ICD-10-CM

## 2016-04-18 DIAGNOSIS — Z7901 Long term (current) use of anticoagulants: Secondary | ICD-10-CM

## 2016-04-18 DIAGNOSIS — R0602 Shortness of breath: Secondary | ICD-10-CM

## 2016-04-18 DIAGNOSIS — I5021 Acute systolic (congestive) heart failure: Secondary | ICD-10-CM

## 2016-04-18 DIAGNOSIS — Z952 Presence of prosthetic heart valve: Secondary | ICD-10-CM | POA: Diagnosis not present

## 2016-04-18 LAB — POCT INR: INR: 3

## 2016-04-18 MED ORDER — FUROSEMIDE 40 MG PO TABS: 40.0000 mg | ORAL_TABLET | Freq: Every day | ORAL | 1 refills | Status: DC

## 2016-04-18 NOTE — Patient Instructions (Addendum)
Your physician has recommended you make the following change in your medication... -- INCREASE lasix to 40mg  daily  Your physician recommends that you return for lab work Tuesday Feb 13  Your physician recommends that you schedule a follow-up appointment in: [place] with Dr. Debara Pickett

## 2016-04-18 NOTE — Progress Notes (Signed)
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 He has been followed by Dr. Geraldo Pitter at [facility] cardiology and was referred to Dr. Evelina Dun at [facility] in 2013 for evaluation of his mitral stenosis. He was followed clinically and underwent mechanical mitral valve replacement in 2015. Since then he's been compliant on warfarin and has had therapeutic to supratherapeutic INRs. LV function was normal prior to surgery however we cannot find echocardiographic results after his surgery. A heart catheterization performed prior to surgery demonstrated a 70% diagonal stenosis and 55% LAD stenosis, but he did not have any bypass surgery. He now presents with one week of progressive shortness of breath, orthopnea and leg edema concerning for heart failure. He seems to be responding to IV Lasix. Chest x-ray shows COPD changes, small bilateral pleural effusions and some basilar atelectasis. Labs indicate markedly elevated BNP of 1406 and troponin is negative 3. There appears to be a mild iron deficiency anemia with H&H of 9 and 29. INR initially was 4.37 that came down to 3.98. He was admitted and treated for heart failure with diuresis. He underwent right and left heart catheterization which demonstrated the following:   Prox RCA lesion, 20% stenosed.  Prox LAD to Mid LAD lesion, 30% stenosed.  Mid LAD lesion, 40% stenosed.  Ost 1st Diag to 1st Diag lesion, 50% stenosed.  Ost 2nd Diag lesion, 30% stenosed.  1. Mild to moderate non-obstructive CAD 2. Normal movement of mechanical mitral valve leaflets.  3. Elevated filling pressures suggesting residual volume overload.   He also had an echocardiogram which showed a newly reduced LVEF of 45-50%.  At discharge he was scheduled to be on Lasix 60 mg daily, and increase from his prior home dose of 40 mg daily. It was also recommended that he discontinue valsartan and spironolactone. After discharge he had routine lab work which demonstrated acute renal failure and creatinine had increased from 1.8-2.9. He was also markedly hyperkalemic at 5.7. This was brought to the attention of the [facility] DOD Dr. Marlou Porch, who recommended hydration and avoiding potassium with a repeat metabolic profile the next day. The repeat metabolic profile showed creatinine had decreased to 2.7 and potassium was 5.3. Since discharge [name] had poor appetite, weight has remained stable and he is felt weak and had poor energy. Much worse than when he was discharged. I spent a good deal of time reviewing the patient's medications with his wife who had a 3 x 5 note card with his previous medications on the front and new medications on the rear. She tells me that he is actually taking all of these medications, therefore he is taking his old dose of Lasix 40 mg in addition to Lasix 60 mg daily as well as spironolactone and valsartan. Certainly explain his acute renal failure as well as hyperkalemia. I reviewed his discharge medication summary which clearly states to discontinue valsartan and Lasix and noted the dose changes of medications. He was seen in his primary care doctor's office after discharge however it appears that these medication changes were not identified. In addition, his INR is markedly elevated today at 6.2. This is up from 3.5 where it was 1 week ago. I suspect again this is related to renal failure, decreased appetite and  nutritional reasons. Medication noncompliance could be playing a role.  Robert [name] returns today for follow-up. Since we sorted out his medications he's done much better. His creatinine has gone back down to baseline of 1.8. He is on iron and B12 and his hemoglobin and hematocrit of come up to 10.7  and 32. He reports marked improvement in his energy. He does say that he has problems with decreased energy and sleep although he feels like he gets a restful night sleep. He denies any snoring or apnea. He wakes up fairly early in the morning and does yoga and often takes a nap in the morning. Unfortunately his INR became subtherapeutic after holding his warfarin for coagulopathy. On the 20th it was 1.4. Today his INR is 1.8.  10/18/2015  Robert [name] returns today for follow-up of 2 recent hospitalizations. He was seen at [facility] and subsequently at [facility] a couple weeks later for small bowel obstruction. He does have a history of gastric cancer and is status post subtotal gastrectomy in 2005. Unfortunately he has not been able to eat and has lost a significant amount of weight. He is currently 119 pounds with a BMI of 19 and does feel somewhat weak. Fortunately, however his LV function has improved back to 55-60% by echo. He was seen in consultation and no further cardiac workup was recommended as he did have some mildly elevated troponins. Systolic to be nonspecific. He's had no problems with his mechanical mitral valve and INR today is finally controlled at 3.5. He tells me he is interested in getting some dental work including having several teeth pulled and having a denture placed at [facility] in the near future.  04/07/2016  Robert [name] presents today with some recent dyspnea on exertion. He says over the past 2 weeks she's become short of breath walking up stairs and now walking on a flat surface. EF had improved recently up to 55-60% by echo, however this was during hospitalization for a small bowel obstruction. At the time he did have elevated troponins but was felt due to a noncardiac etiology. He denies any chest pain however his progressive dyspnea and fatigue with exertion could be due to coronary ischemia. He was noted to have moderate nonobstructive coronary disease by cath more than  a year ago.  04/18/2016  Robert [name] was seen back today in follow-up. He reports some continued shortness of breath and now some lower strandy swelling. He underwent a Lexiscan Myoview which showed no ischemia however EF was back to 45%. He has had 7 pound weight gain since I last saw him consistent with acute systolic congestive heart failure. He is only on Lasix 20 mg every other day.   PMHx:  Past Medical History:  Diagnosis Date  . BPH (benign prostatic hyperplasia)   . Chronic anticoagulation    coumadin for mechanical valves  . CKD (chronic kidney disease), stage III   . Diabetes mellitus type 2, diet-controlled ([place])   . Gastric cancer ([place]) 2005   s/p gastrectomy  . High cholesterol   . HOH (hard of hearing)   . Hypertension   . Rheumatic heart disease   . S/P MVR (mitral valve replacement) 2015   27mm StJude mechanical valve    Past Surgical History:  Procedure Laterality Date  . CARDIAC CATHETERIZATION N/A 05/10/2015   Procedure: Right/Left Heart Cath and Coronary Angiography;  Surgeon: Burnell Blanks, MD;  Location: [place] CV LAB;  Service: Cardiovascular;  Laterality: N/A;  .  HERNIA REPAIR    . MITRAL VALVE REPLACEMENT  10/24/2013   37mm StJude mechanical valve  . PARTIAL GASTRECTOMY  2005    FAMHx:  Family History  Problem Relation Age of Onset  . Hypertension Mother   . Diabetes Mother   . Cancer Brother     SOCHx:   reports that he has never smoked. He has never used smokeless tobacco. He reports that he does not drink alcohol or use drugs.  ALLERGIES:  Allergies  Allergen Reactions  . Penicillins Other (See Comments)  . Vancomycin Other (See Comments)    Kidney problems    ROS: Pertinent items noted in HPI and remainder of comprehensive ROS otherwise negative.  HOME MEDS: Current Outpatient Prescriptions  Medication Sig Dispense Refill  . aspirin 81 MG chewable tablet Chew 81 mg by mouth daily.    Marland Kitchen atorvastatin (LIPITOR) 20 MG  tablet Take 1 tablet (20 mg total) by mouth at bedtime. 90 tablet 3  . carvedilol (COREG) 6.25 MG tablet TAKE 1 TABLET BY MOUTH TWICE A DAY WITH A MEAL 180 tablet 3  . ferrous sulfate 325 (65 FE) MG tablet Take 1 tablet (325 mg total) by mouth 2 (two) times daily with a meal. 30 tablet 3  . furosemide (LASIX) 40 MG tablet Take 1 tablet (40 mg total) by mouth daily. 90 tablet 1  . isosorbide mononitrate (IMDUR) 30 MG 24 hr tablet Take 1 tablet (30 mg total) by mouth daily. 90 tablet 3  . Multiple Vitamin (MULTIVITAMIN WITH MINERALS) TABS tablet Take 1 tablet by mouth daily.    . tamsulosin (FLOMAX) 0.4 MG CAPS capsule Take 1 capsule (0.4 mg total) by mouth daily after supper. 30 capsule 3  . Thiamine Mononitrate (VITAMIN B1 PO) Take 1 tablet by mouth daily.     . vitamin B-12 (CYANOCOBALAMIN) 1000 MCG tablet Take 1 tablet (1,000 mcg total) by mouth daily. 7 tablet 0  . warfarin (COUMADIN) 3 MG tablet Take 1 - 1.5 tablets daily or as directed by Coumadin clinic 105 tablet 1   No current facility-administered medications for this visit.     LABS/IMAGING: No results found for this or any previous visit (from the past 48 hour(s)). No results found.  WEIGHTS: Wt Readings from Last 3 Encounters:  04/18/16 138 lb (62.6 kg)  04/13/16 131 lb (59.4 kg)  04/07/16 131 lb 9.6 oz (59.7 kg)    VITALS: BP 138/90 (BP Location: Left Arm, Patient Position: Sitting, Cuff Size: Small)   Pulse (!) 56   Ht 5\' 6"  (1.676 m)   Wt 138 lb (62.6 kg)   SpO2 100%   BMI 22.27 kg/m   EXAM: General appearance: alert, cachectic and no distress Neck: JVD - 3 cm above sternal notch and no carotid bruit Lungs: rales bibasilar Heart: regular rate and rhythm, S1, S2 normal and sharp mechanical valve sounds Abdomen: soft, non-tender; bowel sounds normal; no masses,  no organomegaly Extremities: extremities normal, atraumatic, no cyanosis or edema Pulses: 2+ and symmetric Skin: Skin color, texture, turgor normal. No  rashes or lesions Neurologic: Grossly normal Psych: Pleasant  EKG: Deferred  ASSESSMENT: 1. Progressive DOE 2. Acute systolic congestive heart failure-EF reduced to 45%, now up to 55-60% (2017) 3. Mild to moderate nonobstructive coronary artery disease by cath 4. Normally functioning mechanical mitral valve 5. Warfarin coagulopathy 6. Acute on chronic kidney disease stage III 7. Medication noncompliance  PLAN: 1.   Mr. Metzker had a low risk Myoview which is negative  for ischemia however EF is again 45%. He's had 7 pound weight gain consistent with acute systolic congestive heart failure. I recommend increasing his Lasix to 40 mg every day from 20 mg every other day. He will need repeat metabolic profile and BNP next week. Follow-up in a few weeks. If renal function is fairly stable, we may need to consider starting Entresto.  Pixie Casino, MD, [facility] Attending Cardiologist [place] 04/18/2016, 9:55 AM

## 2016-05-04 ENCOUNTER — Ambulatory Visit (INDEPENDENT_AMBULATORY_CARE_PROVIDER_SITE_OTHER): Payer: BC Managed Care – PPO | Admitting: Pharmacist

## 2016-05-04 DIAGNOSIS — Z7901 Long term (current) use of anticoagulants: Secondary | ICD-10-CM

## 2016-05-04 DIAGNOSIS — Z952 Presence of prosthetic heart valve: Secondary | ICD-10-CM | POA: Diagnosis not present

## 2016-05-04 LAB — POCT INR: INR: 2

## 2016-05-16 ENCOUNTER — Encounter: Payer: Self-pay | Admitting: Internal Medicine

## 2016-05-16 ENCOUNTER — Ambulatory Visit (INDEPENDENT_AMBULATORY_CARE_PROVIDER_SITE_OTHER): Payer: BC Managed Care – PPO | Admitting: Pharmacist

## 2016-05-16 ENCOUNTER — Ambulatory Visit: Payer: BC Managed Care – PPO | Admitting: Internal Medicine

## 2016-05-16 ENCOUNTER — Ambulatory Visit (INDEPENDENT_AMBULATORY_CARE_PROVIDER_SITE_OTHER): Payer: BC Managed Care – PPO | Admitting: Internal Medicine

## 2016-05-16 VITALS — BP 156/70 | HR 60 | Ht 66.0 in | Wt 131.0 lb

## 2016-05-16 DIAGNOSIS — R0609 Other forms of dyspnea: Secondary | ICD-10-CM

## 2016-05-16 DIAGNOSIS — Z7901 Long term (current) use of anticoagulants: Secondary | ICD-10-CM | POA: Diagnosis not present

## 2016-05-16 DIAGNOSIS — Z952 Presence of prosthetic heart valve: Secondary | ICD-10-CM | POA: Diagnosis not present

## 2016-05-16 DIAGNOSIS — I5021 Acute systolic (congestive) heart failure: Secondary | ICD-10-CM | POA: Diagnosis not present

## 2016-05-16 DIAGNOSIS — E43 Unspecified severe protein-calorie malnutrition: Secondary | ICD-10-CM | POA: Diagnosis not present

## 2016-05-16 LAB — POCT INR: INR: 2.8

## 2016-05-16 NOTE — Progress Notes (Signed)
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 He has been followed by Dr. Geraldo Pitter at [facility] cardiology and was referred to Dr. Evelina Dun at [facility] in 2013 for evaluation of his mitral stenosis. He was followed clinically and underwent mechanical mitral valve replacement in 2015. Since then he's been compliant on warfarin and has had therapeutic to supratherapeutic INRs. LV function was normal prior to surgery however we cannot find echocardiographic results after his surgery. A heart catheterization performed prior to surgery demonstrated a 70% diagonal stenosis and 55% LAD stenosis, but he did not have any bypass surgery. He now presents with one week of progressive shortness of breath, orthopnea and leg edema concerning for heart failure. He seems to be responding to IV Lasix. Chest x-ray shows COPD changes, small bilateral pleural effusions and some basilar atelectasis. Labs indicate markedly elevated BNP of 1406 and troponin is negative 3. There appears to be a mild iron deficiency anemia with H&H of 9 and 29. INR initially was 4.37 that came down to 3.98. He was admitted and treated for heart failure with diuresis. He underwent right and left heart catheterization which demonstrated the following:   Prox RCA lesion, 20% stenosed.  Prox LAD to Mid LAD lesion, 30% stenosed.  Mid LAD lesion, 40% stenosed.  Ost 1st Diag to 1st Diag lesion, 50% stenosed.  Ost 2nd Diag lesion, 30% stenosed.  1. Mild to moderate non-obstructive CAD 2. Normal movement of mechanical mitral valve leaflets.  3. Elevated filling pressures suggesting residual volume overload.   He also had an echocardiogram which showed a newly reduced LVEF of 45-50%.  At discharge he was scheduled to be on Lasix 60 mg daily, and increase from his prior home dose of 40 mg daily. It was also recommended that he discontinue valsartan and spironolactone. After discharge he had routine lab work which demonstrated acute renal failure and creatinine had increased from 1.8-2.9. He was also markedly hyperkalemic at 5.7. This was brought to the attention of the [facility] DOD Dr. Marlou Porch, who recommended hydration and avoiding potassium with a repeat metabolic profile the next day. The repeat metabolic profile showed creatinine had decreased to 2.7 and potassium was 5.3. Since discharge Mr. Graffeo had poor appetite, weight has remained stable and he is felt weak and had poor energy. Much worse than when he was discharged. I spent a good deal of time reviewing the patient's medications with his wife who had a 3 x 5 note card with his previous medications on the front and new medications on the rear. She tells me that he is actually taking all of these medications, therefore he is taking his old dose of Lasix 40 mg in addition to Lasix 60 mg daily as well as spironolactone and valsartan. Certainly explain his acute renal failure as well as hyperkalemia. I reviewed his discharge medication summary which clearly states to discontinue valsartan and Lasix and noted the dose changes of medications. He was seen in his primary care doctor's office after discharge however it appears that these medication changes were not identified. In addition, his INR is markedly elevated today at 6.2. This is up from 3.5 where it was 1 week ago. I suspect again this is related to renal failure, decreased appetite and  nutritional reasons. Medication noncompliance could be playing a role.  Mr. Niemeyer returns today for follow-up. Since we sorted out his medications he's done much better. His creatinine has gone back down to baseline of 1.8. He is on iron and B12 and his hemoglobin and hematocrit of come up to 10.7  and 32. He reports marked improvement in his energy. He does say that he has problems with decreased energy and sleep although he feels like he gets a restful night sleep. He denies any snoring or apnea. He wakes up fairly early in the morning and does yoga and often takes a nap in the morning. Unfortunately his INR became subtherapeutic after holding his warfarin for coagulopathy. On the 20th it was 1.4. Today his INR is 1.8.  10/18/2015  Mr. Vermette returns today for follow-up of 2 recent hospitalizations. He was seen at [facility] and subsequently at [facility] a couple weeks later for small bowel obstruction. He does have a history of gastric cancer and is status post subtotal gastrectomy in 2005. Unfortunately he has not been able to eat and has lost a significant amount of weight. He is currently 119 pounds with a BMI of 19 and does feel somewhat weak. Fortunately, however his LV function has improved back to 55-60% by echo. He was seen in consultation and no further cardiac workup was recommended as he did have some mildly elevated troponins. Systolic to be nonspecific. He's had no problems with his mechanical mitral valve and INR today is finally controlled at 3.5. He tells me he is interested in getting some dental work including having several teeth pulled and having a denture placed at [facility] in the near future.  04/07/2016  Mr. Hausmann presents today with some recent dyspnea on exertion. He says over the past 2 weeks she's become short of breath walking up stairs and now walking on a flat surface. EF had improved recently up to 55-60% by echo, however this was during hospitalization for a small bowel obstruction. At the time he did have elevated troponins but was felt due to a noncardiac etiology. He denies any chest pain however his progressive dyspnea and fatigue with exertion could be due to coronary ischemia. He was noted to have moderate nonobstructive coronary disease by cath more than  a year ago.  04/18/2016  Mr. Pacifico was seen back today in follow-up. He reports some continued shortness of breath and now some lower strandy swelling. He underwent a Lexiscan Myoview which showed no ischemia however EF was back to 45%. He has had 7 pound weight gain since I last saw him consistent with acute systolic congestive heart failure. He is only on Lasix 20 mg every other day.  05/15/2016  Mr. Didier returns today for follow-up. Blood pressure is elevated 156/70. He reports that increasing his Lasix has helped with some of his shortness of breath and abdominal swelling. Unfortunately did not get blood work drawn as I had requested. He is due for repeat metabolic profile and BNP. We discussed briefly the addition of Entresto he may be a good candidate for that. I like to evaluate his labs today and make a determination based on his renal function. He also is reported recently a decrease in appetite and early satiety, particularly with breads and other foods that he cannot tolerate. This sounds like more of a GI issue and I've encouraged him to follow-up with his gastroenterologist in [place].   PMHx:  Past Medical History:  Diagnosis Date  .  BPH (benign prostatic hyperplasia)   . Chronic anticoagulation    coumadin for mechanical valves  . CKD (chronic kidney disease), stage III   . Diabetes mellitus type 2, diet-controlled ([place])   . Gastric cancer ([place]) 2005   s/p gastrectomy  . High cholesterol   . HOH (hard of hearing)   . Hypertension   . Rheumatic heart disease   . S/P MVR (mitral valve replacement) 2015   75mm StJude mechanical valve    Past Surgical History:  Procedure Laterality Date  . CARDIAC CATHETERIZATION N/A 05/10/2015   Procedure: Right/Left Heart Cath and Coronary Angiography;  Surgeon: Burnell Blanks, MD;  Location: [facility];  Service: Cardiovascular;  Laterality: N/A;  . HERNIA REPAIR    . MITRAL VALVE REPLACEMENT  10/24/2013   89mm StJude  mechanical valve  . PARTIAL GASTRECTOMY  2005    FAMHx:  Family History  Problem Relation Age of Onset  . Hypertension Mother   . Diabetes Mother   . Cancer Brother     SOCHx:   reports that he has never smoked. He has never used smokeless tobacco. He reports that he does not drink alcohol or use drugs.  ALLERGIES:  Allergies  Allergen Reactions  . Penicillins Nausea And Vomiting  . Vancomycin Other (See Comments)    Kidney problems    ROS: Pertinent items noted in HPI and remainder of comprehensive ROS otherwise negative.  HOME MEDS: Current Outpatient Prescriptions  Medication Sig Dispense Refill  . aspirin 81 MG chewable tablet Chew 81 mg by mouth daily.    Marland Kitchen atorvastatin (LIPITOR) 20 MG tablet Take 1 tablet (20 mg total) by mouth at bedtime. 90 tablet 3  . carvedilol (COREG) 6.25 MG tablet TAKE 1 TABLET BY MOUTH TWICE A DAY WITH A MEAL 180 tablet 3  . ferrous sulfate 325 (65 FE) MG tablet Take 1 tablet (325 mg total) by mouth 2 (two) times daily with a meal. 30 tablet 3  . furosemide (LASIX) 40 MG tablet Take 1 tablet (40 mg total) by mouth daily. 90 tablet 1  . isosorbide mononitrate (IMDUR) 30 MG 24 hr tablet Take 1 tablet (30 mg total) by mouth daily. 90 tablet 3  . Multiple Vitamin (MULTIVITAMIN WITH MINERALS) TABS tablet Take 1 tablet by mouth daily.    . tamsulosin (FLOMAX) 0.4 MG CAPS capsule Take 1 capsule (0.4 mg total) by mouth daily after supper. 30 capsule 3  . Thiamine Mononitrate (VITAMIN B1 PO) Take 1 tablet by mouth daily.     . vitamin B-12 (CYANOCOBALAMIN) 1000 MCG tablet Take 1 tablet (1,000 mcg total) by mouth daily. 7 tablet 0  . warfarin (COUMADIN) 3 MG tablet Take 1 - 1.5 tablets daily or as directed by Coumadin clinic 105 tablet 1   No current facility-administered medications for this visit.     LABS/IMAGING: No results found for this or any previous visit (from the past 48 hour(s)). No results found.  WEIGHTS: Wt Readings from Last 3  Encounters:  05/16/16 131 lb (59.4 kg)  04/18/16 138 lb (62.6 kg)  04/13/16 131 lb (59.4 kg)    VITALS: BP (!) 156/70 (BP Location: Right Arm, Patient Position: Sitting, Cuff Size: Normal)   Pulse 60   Ht 5\' 6"  (1.676 m)   Wt 131 lb (59.4 kg)   SpO2 99%   BMI 21.14 kg/m   EXAM: General appearance: alert, cachectic and no distress Neck: no carotid bruit and no JVD Lungs: clear to  auscultation bilaterally Heart: regular rate and rhythm, S1, S2 normal and sharp mechanical valve sounds Abdomen: soft, non-tender; bowel sounds normal; no masses,  no organomegaly and mildly protuberant Extremities: extremities normal, atraumatic, no cyanosis or edema Pulses: 2+ and symmetric Skin: Skin color, texture, turgor normal. No rashes or lesions Neurologic: Grossly normal Psych: Pleasant  EKG: Deferred  ASSESSMENT: 1. Acute systolic congestive heart failure-EF reduced to 45%, now up to 55-60% (2017) 2. Mild to moderate nonobstructive coronary artery disease by cath 3. Normally functioning mechanical mitral valve 4. Warfarin coagulopathy 5. Acute on chronic kidney disease stage III 6. Medication noncompliance  PLAN: 1.   Mr. Dunston had a recent decline in EF to 45% with acute systolic congestive heart failure. He is diuresed on the weight is about the same as it was. He reports feeling a little better and having less abdominal distention. I like to recheck his labs including BNP and renal function. He may need a higher dose of Lasix and we could consider starting Entresto in addition to his current regimen based on his NYHA class 2-3 heart failure symptoms if renal function would tolerate it.  Pixie Casino, MD, [facility] Attending Cardiologist [place] [facility] 05/16/2016, 9:33 AM

## 2016-05-16 NOTE — Patient Instructions (Signed)
Medication Instructions:  Your physician recommends that you continue on your current medications as directed. Please refer to the Current Medication list given to you today.  Labwork: Your physician recommends that you return for lab work in: [place]   Testing/Procedures: NONE   Follow-Up: Your physician recommends that you schedule a follow-up appointment in: 3 MONTHS WITH DR HILTY  Any Other Special Instructions Will Be Listed Below (If Applicable).  If you need a refill on your cardiac medications before your next appointment, please call your pharmacy.

## 2016-05-17 LAB — BASIC METABOLIC PANEL
BUN: 18 mg/dL (ref 7–25)
CHLORIDE: 104 mmol/L (ref 98–110)
CO2: 30 mmol/L (ref 20–31)
Calcium: 8.6 mg/dL (ref 8.6–10.3)
Creat: 1.68 mg/dL — ABNORMAL HIGH (ref 0.70–1.18)
GLUCOSE: 102 mg/dL — AB (ref 65–99)
POTASSIUM: 4.3 mmol/L (ref 3.5–5.3)
SODIUM: 144 mmol/L (ref 135–146)

## 2016-05-17 LAB — BRAIN NATRIURETIC PEPTIDE: BRAIN NATRIURETIC PEPTIDE: 733.9 pg/mL — AB (ref <100)

## 2016-05-19 ENCOUNTER — Telehealth: Payer: Self-pay | Admitting: Internal Medicine

## 2016-05-19 DIAGNOSIS — Z79899 Other long term (current) drug therapy: Secondary | ICD-10-CM

## 2016-05-19 NOTE — Telephone Encounter (Signed)
Patient's wife notified of lab results. Med list updated. BMET ordered - will have labs same day as appt scheduled for 06/06/16 @ 0915  Notes Recorded by Pixie Casino, MD on 05/18/2016 at 7:47 AM EST Creatinine stable - BNP remains elevated. Increase lasix to 40 mg BID. Follow-up in 2-3 weeks with a repeat BMET at that time.  DR. Lemmie Evens

## 2016-05-29 ENCOUNTER — Encounter: Payer: Self-pay | Admitting: Pharmacist

## 2016-05-29 ENCOUNTER — Telehealth: Payer: Self-pay | Admitting: Pharmacist

## 2016-05-29 ENCOUNTER — Telehealth: Payer: Self-pay | Admitting: Internal Medicine

## 2016-05-29 NOTE — Telephone Encounter (Signed)
Patient received clearance form Dr Debara Pickett to hold warfarin 5 days prior to dental procedure. Noted patient with Hx of Mechanical Mitral Valve.  Per policy patient needs bridge plan.  Need date of procedure to prvide bridging plan to patient.  Fax sent to Oral surgeon at [phone].

## 2016-05-29 NOTE — Telephone Encounter (Signed)
Thank you for the update.  Called Dr Sander Radon office today. No date for procedure yet.  Patient will need to bridge with lovenox (enoxaparin) due to mechanical Mitral valve. Bridging plan will be given to patient as soon as surgery date know.

## 2016-05-29 NOTE — Telephone Encounter (Signed)
New Message     Dr Mancel Parsons would like to speak with you about this pt condition before doing dental work

## 2016-05-29 NOTE — Telephone Encounter (Signed)
SPOKE WITH  DR  DRAB AND PT  NEEDS  8 TEETH  EXTRACTED  DUE TO   ABSCESS AND  INFECTION  PT  IS CURRENTLY ON COUMADIN   WILL FORWARD  TO  DR  HILTY FOR  REVIEW  AND RECOMMENDATIONS .Adonis Housekeeper

## 2016-05-29 NOTE — Telephone Encounter (Signed)
He will need to hold warfarin for 5 days prior to teeth extraction and restart afterwards. Will cc: our anticoagulation clinic regarding this.  Dr. Lemmie Evens

## 2016-06-06 ENCOUNTER — Encounter: Payer: Self-pay | Admitting: Internal Medicine

## 2016-06-06 ENCOUNTER — Ambulatory Visit (INDEPENDENT_AMBULATORY_CARE_PROVIDER_SITE_OTHER): Payer: BC Managed Care – PPO | Admitting: Pharmacist

## 2016-06-06 ENCOUNTER — Ambulatory Visit (INDEPENDENT_AMBULATORY_CARE_PROVIDER_SITE_OTHER): Payer: BC Managed Care – PPO | Admitting: Internal Medicine

## 2016-06-06 VITALS — BP 115/66 | HR 65 | Ht 66.0 in | Wt 127.4 lb

## 2016-06-06 DIAGNOSIS — N183 Chronic kidney disease, stage 3 unspecified: Secondary | ICD-10-CM

## 2016-06-06 DIAGNOSIS — Z7901 Long term (current) use of anticoagulants: Secondary | ICD-10-CM

## 2016-06-06 DIAGNOSIS — E43 Unspecified severe protein-calorie malnutrition: Secondary | ICD-10-CM

## 2016-06-06 DIAGNOSIS — Z952 Presence of prosthetic heart valve: Secondary | ICD-10-CM

## 2016-06-06 DIAGNOSIS — I5021 Acute systolic (congestive) heart failure: Secondary | ICD-10-CM | POA: Diagnosis not present

## 2016-06-06 LAB — POCT INR: INR: 2.5

## 2016-06-06 NOTE — Progress Notes (Signed)
OFFICE NOTE  Chief Complaint:  "I feel better"  Primary Care Physician: Burman Freestone, MD  HPI:  Robert [name] is a 76 y.o. 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 He has been followed by Dr. Geraldo Pitter at [facility] cardiology and was referred to Dr. Evelina Dun at [facility] in 2013 for evaluation of his mitral stenosis. He was followed clinically and underwent mechanical mitral valve replacement in 2015. Since then he's been compliant on warfarin and has had therapeutic to supratherapeutic INRs. LV function was normal prior to surgery however we cannot find echocardiographic results after his surgery. A heart catheterization performed prior to surgery demonstrated a 70% diagonal stenosis and 55% LAD stenosis, but he did not have any bypass surgery. He now presents with one week of progressive shortness of breath, orthopnea and leg edema concerning for heart failure. He seems to be responding to IV Lasix. Chest x-ray shows COPD changes, small bilateral pleural effusions and some basilar atelectasis. Labs indicate markedly elevated BNP of 1406 and troponin is negative 3. There appears to be a mild iron deficiency anemia with H&H of 9 and 29. INR initially was 4.37 that came down to 3.98. He was admitted and treated for heart failure with diuresis. He underwent right and left heart catheterization which demonstrated the following:   Prox RCA lesion, 20% stenosed.  Prox LAD to Mid LAD lesion, 30% stenosed.  Mid LAD lesion, 40% stenosed.  Ost 1st Diag to 1st Diag lesion, 50% stenosed.  Ost 2nd Diag lesion, 30% stenosed.  1. Mild to moderate non-obstructive CAD 2. Normal movement of mechanical mitral valve leaflets.  3. Elevated filling pressures suggesting residual volume overload.   He also had an echocardiogram which showed a newly reduced LVEF of 45-50%. At  discharge he was scheduled to be on Lasix 60 mg daily, and increase from his prior home dose of 40 mg daily. It was also recommended that he discontinue valsartan and spironolactone. After discharge he had routine lab work which demonstrated acute renal failure and creatinine had increased from 1.8-2.9. He was also markedly hyperkalemic at 5.7. This was brought to the attention of the [facility] DOD Dr. Marlou Porch, who recommended hydration and avoiding potassium with a repeat metabolic profile the next day. The repeat metabolic profile showed creatinine had decreased to 2.7 and potassium was 5.3. Since discharge Robert [name] had poor appetite, weight has remained stable and he is felt weak and had poor energy. Much worse than when he was discharged. I spent a good deal of time reviewing the patient's medications with his wife who had a 3 x 5 note card with his previous medications on the front and new medications on the rear. She tells me that he is actually taking all of these medications, therefore he is taking his old dose of Lasix 40 mg in addition to Lasix 60 mg daily as well as spironolactone and valsartan. Certainly explain his acute renal failure as well as hyperkalemia. I reviewed his discharge medication summary which clearly states to discontinue valsartan and Lasix and noted the dose changes of medications. He was seen in his primary care doctor's office after discharge however it appears that these medication changes were not identified. In addition, his INR is markedly elevated today at 6.2. This is up from 3.5 where it was 1 week ago. I suspect again this is related to renal failure, decreased appetite  and nutritional reasons. Medication noncompliance could be playing a role.  Robert [name] returns today for follow-up. Since we sorted out his medications he's done much better. His creatinine has gone back down to baseline of 1.8. He is on iron and B12 and his hemoglobin and hematocrit of come up to 10.7 and  32. He reports marked improvement in his energy. He does say that he has problems with decreased energy and sleep although he feels like he gets a restful night sleep. He denies any snoring or apnea. He wakes up fairly early in the morning and does yoga and often takes a nap in the morning. Unfortunately his INR became subtherapeutic after holding his warfarin for coagulopathy. On the 20th it was 1.4. Today his INR is 1.8.  10/18/2015  Robert [name] returns today for follow-up of 2 recent hospitalizations. He was seen at [facility] and subsequently at [facility] a couple weeks later for small bowel obstruction. He does have a history of gastric cancer and is status post subtotal gastrectomy in 2005. Unfortunately he has not been able to eat and has lost a significant amount of weight. He is currently 119 pounds with a BMI of 19 and does feel somewhat weak. Fortunately, however his LV function has improved back to 55-60% by echo. He was seen in consultation and no further cardiac workup was recommended as he did have some mildly elevated troponins. Systolic to be nonspecific. He's had no problems with his mechanical mitral valve and INR today is finally controlled at 3.5. He tells me he is interested in getting some dental work including having several teeth pulled and having a denture placed at [facility] in the near future.  04/07/2016  Robert [name] presents today with some recent dyspnea on exertion. He says over the past 2 weeks she's become short of breath walking up stairs and now walking on a flat surface. EF had improved recently up to 55-60% by echo, however this was during hospitalization for a small bowel obstruction. At the time he did have elevated troponins but was felt due to a noncardiac etiology. He denies any chest pain however his progressive dyspnea and fatigue with exertion could be due to coronary ischemia. He was noted to have moderate nonobstructive coronary disease by cath more than a  year ago.  04/18/2016  Robert [name] was seen back today in follow-up. He reports some continued shortness of breath and now some lower strandy swelling. He underwent a Lexiscan Myoview which showed no ischemia however EF was back to 45%. He has had 7 pound weight gain since I last saw him consistent with acute systolic congestive heart failure. He is only on Lasix 20 mg every other day.  05/15/2016  Robert [name] returns today for follow-up. Blood pressure is elevated 156/70. He reports that increasing his Lasix has helped with some of his shortness of breath and abdominal swelling. Unfortunately did not get blood work drawn as I had requested. He is due for repeat metabolic profile and BNP. We discussed briefly the addition of Entresto he may be a good candidate for that. I like to evaluate his labs today and make a determination based on his renal function. He also is reported recently a decrease in appetite and early satiety, particularly with breads and other foods that he cannot tolerate. This sounds like more of a GI issue and I've encouraged him to follow-up with his gastroenterologist in [place].  06/06/2016  Robert [name] was seen today in follow-up.  He reports breathing improved significantly as well as the swelling reduced after adding a second dose of diuretics to his regimen. Currently he is on Lasix 40 mg twice a day. Weight is gone down from 131-127 pounds. He has no lower extremity edema. In fact he is close to underweight, more consistent with the fact that his diet is very picky and he continues to have problems with his GI system. His wife says that he eats very scant portions. The pressure is well-controlled today. He has not had repeat lab work which I recommended to reassess his creatinine. In the past his creatinine is been somewhat higher on increased dose of diuretics, but we may need to allow this. I'd also like to see if his chronic kidney disease would support the use of  Entresto.   PMHx:  Past Medical History:  Diagnosis Date  . BPH (benign prostatic hyperplasia)   . Chronic anticoagulation    coumadin for mechanical valves  . CKD (chronic kidney disease), stage III   . Diabetes mellitus type 2, diet-controlled ([place])   . Gastric cancer ([place]) 2005   s/p gastrectomy  . High cholesterol   . HOH (hard of hearing)   . Hypertension   . Rheumatic heart disease   . S/P MVR (mitral valve replacement) 2015   69mm StJude mechanical valve    Past Surgical History:  Procedure Laterality Date  . CARDIAC CATHETERIZATION N/A 05/10/2015   Procedure: Right/Left Heart Cath and Coronary Angiography;  Surgeon: Burnell Blanks, MD;  Location: [place] [facility];  Service: Cardiovascular;  Laterality: N/A;  . HERNIA REPAIR    . MITRAL VALVE REPLACEMENT  10/24/2013   30mm StJude mechanical valve  . PARTIAL GASTRECTOMY  2005    FAMHx:  Family History  Problem Relation Age of Onset  . Hypertension Mother   . Diabetes Mother   . Cancer Brother     SOCHx:   reports that he has never smoked. He has never used smokeless tobacco. He reports that he does not drink alcohol or use drugs.  ALLERGIES:  Allergies  Allergen Reactions  . Penicillins Nausea And Vomiting  . Vancomycin Other (See Comments)    Kidney problems    ROS: Pertinent items noted in HPI and remainder of comprehensive ROS otherwise negative.  HOME MEDS: Current Outpatient Prescriptions  Medication Sig Dispense Refill  . aspirin 81 MG chewable tablet Chew 81 mg by mouth daily.    Marland Kitchen atorvastatin (LIPITOR) 20 MG tablet Take 1 tablet (20 mg total) by mouth at bedtime. 90 tablet 3  . carvedilol (COREG) 6.25 MG tablet TAKE 1 TABLET BY MOUTH TWICE A DAY WITH A MEAL 180 tablet 3  . ferrous sulfate 325 (65 FE) MG tablet Take 1 tablet (325 mg total) by mouth 2 (two) times daily with a meal. 30 tablet 3  . furosemide (LASIX) 40 MG tablet Take 40 mg by mouth 2 (two) times daily.    . isosorbide  mononitrate (IMDUR) 30 MG 24 hr tablet Take 1 tablet (30 mg total) by mouth daily. 90 tablet 3  . Multiple Vitamin (MULTIVITAMIN WITH MINERALS) TABS tablet Take 1 tablet by mouth daily.    . tamsulosin (FLOMAX) 0.4 MG CAPS capsule Take 1 capsule (0.4 mg total) by mouth daily after supper. 30 capsule 3  . Thiamine Mononitrate (VITAMIN B1 PO) Take 1 tablet by mouth daily.     . vitamin B-12 (CYANOCOBALAMIN) 1000 MCG tablet Take 1 tablet (1,000 mcg total) by  mouth daily. 7 tablet 0  . warfarin (COUMADIN) 3 MG tablet Take 1 - 1.5 tablets daily or as directed by Coumadin clinic 105 tablet 1   No current facility-administered medications for this visit.     LABS/IMAGING: Results for orders placed or performed in visit on 06/06/16 (from the past 48 hour(s))  POCT INR     Status: None   Collection Time: 06/06/16  9:12 AM  Result Value Ref Range   INR 2.5    No results found.  WEIGHTS: Wt Readings from Last 3 Encounters:  06/06/16 127 lb 6.4 oz (57.8 kg)  05/16/16 131 lb (59.4 kg)  04/18/16 138 lb (62.6 kg)    VITALS: BP 115/66   Pulse 65   Ht 5\' 6"  (1.676 m)   Wt 127 lb 6.4 oz (57.8 kg)   BMI 20.56 kg/m   EXAM: General appearance: alert, cachectic and no distress Neck: no carotid bruit and no JVD Lungs: clear to auscultation bilaterally Heart: regular rate and rhythm, S1, S2 normal and sharp mechanical valve sounds Abdomen: soft, non-tender; bowel sounds normal; no masses,  no organomegaly and mildly protuberant Extremities: extremities normal, atraumatic, no cyanosis or edema Pulses: 2+ and symmetric Skin: Skin color, texture, turgor normal. No rashes or lesions Neurologic: Grossly normal Psych: Pleasant  EKG: Deferred  ASSESSMENT: 1. Acute systolic congestive heart failure-EF reduced to 45%, now up to 55-60% (2017) 2. Mild to moderate nonobstructive coronary artery disease by cath 3. Normally functioning mechanical mitral valve 4. Warfarin coagulopathy 5. Acute on  chronic kidney disease stage III 6. Medication noncompliance  PLAN: 1.   Robert [name] recently had symptoms more consistent with acute heart failure. His repeat stress test in 03/2016 was negative for ischemia however showed an EF that was reduced about 45% although visually it appeared higher. Although we did not reassess an echo his EF is probably somewhere between 45 and 55% and this could be other systolic or diastolic heart failure. Nonetheless it is not change management as the increased diuretics has improved his symptoms. Based on his renal function, would consider him a candidate for Entresto. He did have at least class II heart failure symptoms and has had reduced LV function in the past, however this would be containdicated in stage III kidney disease.  I'll contact him with the results of his blood work in any medicine changes going forward, but plan to see him back in 6 months or sooner as necessary. Also, he is planning on upcoming dental extraction. We've already discussed that he would need to hold warfarin for 5 days prior to that and will require bridging with Lovenox given his mechanical mitral valve. This is being handled by our anticoagulation clinic pharmacists.  Pixie Casino, MD, [facility] Attending Cardiologist Calmar C Hilty 06/06/2016, 1:22 PM

## 2016-06-06 NOTE — Patient Instructions (Signed)
Your physician recommends that you continue on your current medications as directed. Please refer to the Current Medication list given to you today.  Your physician recommends that you return for lab work TODAY  Your physician wants you to follow-up in: 6 months with Dr. Debara Pickett. You will receive a reminder letter in the mail two months in advance. If you don't receive a letter, please call our office to schedule the follow-up appointment.

## 2016-06-07 LAB — BASIC METABOLIC PANEL WITH GFR
BUN: 31 mg/dL — ABNORMAL HIGH (ref 7–25)
CO2: 31 mmol/L (ref 20–31)
Calcium: 8.7 mg/dL (ref 8.6–10.3)
Chloride: 99 mmol/L (ref 98–110)
Creat: 2.06 mg/dL — ABNORMAL HIGH (ref 0.70–1.18)
Glucose, Bld: 69 mg/dL (ref 65–99)
Potassium: 4 mmol/L (ref 3.5–5.3)
Sodium: 140 mmol/L (ref 135–146)

## 2016-06-08 ENCOUNTER — Encounter: Payer: Self-pay | Admitting: Internal Medicine

## 2016-06-09 ENCOUNTER — Other Ambulatory Visit: Payer: Self-pay | Admitting: Internal Medicine

## 2016-06-14 ENCOUNTER — Other Ambulatory Visit: Payer: Self-pay | Admitting: Internal Medicine

## 2016-06-14 MED ORDER — FUROSEMIDE 40 MG PO TABS: 40.0000 mg | ORAL_TABLET | Freq: Two times a day (BID) | ORAL | 1 refills | Status: DC

## 2016-06-14 NOTE — Telephone Encounter (Signed)
New Message     *STAT* If patient is at the pharmacy, call can be transferred to refill team.   1. Which medications need to be refilled? (please list name of each medication and dose if known) warfarin (COUMADIN) 3 MG tablet furosemide (LASIX) 40 MG tablet 2. Which pharmacy/location (including street and city if local pharmacy) is medication to be sent to? [id number] [place], [place] [address]  3. Do they need a 30 day or 90 day supply? 90 day supply for both

## 2016-06-14 NOTE — Telephone Encounter (Signed)
Rx(s) sent to pharmacy electronically.  

## 2016-07-06 ENCOUNTER — Ambulatory Visit (INDEPENDENT_AMBULATORY_CARE_PROVIDER_SITE_OTHER): Payer: BC Managed Care – PPO | Admitting: Pharmacist Clinician (PhC)/ Clinical Pharmacy Specialist

## 2016-07-06 DIAGNOSIS — Z7901 Long term (current) use of anticoagulants: Secondary | ICD-10-CM | POA: Diagnosis not present

## 2016-07-06 DIAGNOSIS — Z952 Presence of prosthetic heart valve: Secondary | ICD-10-CM

## 2016-07-06 LAB — POCT INR: INR: 2.6

## 2016-08-01 ENCOUNTER — Ambulatory Visit (INDEPENDENT_AMBULATORY_CARE_PROVIDER_SITE_OTHER): Payer: BC Managed Care – PPO | Admitting: Pharmacist Clinician (PhC)/ Clinical Pharmacy Specialist

## 2016-08-01 DIAGNOSIS — Z7901 Long term (current) use of anticoagulants: Secondary | ICD-10-CM | POA: Diagnosis not present

## 2016-08-01 DIAGNOSIS — Z952 Presence of prosthetic heart valve: Secondary | ICD-10-CM | POA: Diagnosis not present

## 2016-08-01 LAB — POCT INR: INR: 2.6

## 2016-08-29 ENCOUNTER — Ambulatory Visit (INDEPENDENT_AMBULATORY_CARE_PROVIDER_SITE_OTHER): Payer: BC Managed Care – PPO | Admitting: Pharmacist

## 2016-08-29 DIAGNOSIS — Z952 Presence of prosthetic heart valve: Secondary | ICD-10-CM

## 2016-08-29 DIAGNOSIS — Z7901 Long term (current) use of anticoagulants: Secondary | ICD-10-CM

## 2016-08-29 LAB — POCT INR: INR: 4.6

## 2016-09-01 ENCOUNTER — Ambulatory Visit: Payer: BC Managed Care – PPO | Admitting: Internal Medicine

## 2016-09-05 ENCOUNTER — Ambulatory Visit (INDEPENDENT_AMBULATORY_CARE_PROVIDER_SITE_OTHER): Payer: BC Managed Care – PPO | Admitting: Pharmacist Clinician (PhC)/ Clinical Pharmacy Specialist

## 2016-09-05 DIAGNOSIS — Z7901 Long term (current) use of anticoagulants: Secondary | ICD-10-CM

## 2016-09-05 DIAGNOSIS — Z952 Presence of prosthetic heart valve: Secondary | ICD-10-CM

## 2016-09-05 LAB — POCT INR: INR: 3.7

## 2016-09-06 ENCOUNTER — Other Ambulatory Visit: Payer: Self-pay | Admitting: Internal Medicine

## 2016-09-15 ENCOUNTER — Ambulatory Visit (INDEPENDENT_AMBULATORY_CARE_PROVIDER_SITE_OTHER): Payer: BC Managed Care – PPO | Admitting: Pharmacist

## 2016-09-15 DIAGNOSIS — Z7901 Long term (current) use of anticoagulants: Secondary | ICD-10-CM

## 2016-09-15 DIAGNOSIS — Z952 Presence of prosthetic heart valve: Secondary | ICD-10-CM

## 2016-09-15 LAB — POCT INR: INR: 1.8

## 2016-09-29 ENCOUNTER — Ambulatory Visit (INDEPENDENT_AMBULATORY_CARE_PROVIDER_SITE_OTHER): Payer: BC Managed Care – PPO | Admitting: Pharmacist

## 2016-09-29 DIAGNOSIS — Z952 Presence of prosthetic heart valve: Secondary | ICD-10-CM

## 2016-09-29 DIAGNOSIS — Z7901 Long term (current) use of anticoagulants: Secondary | ICD-10-CM

## 2016-09-29 LAB — POCT INR: INR: 2.5

## 2016-10-10 ENCOUNTER — Telehealth: Payer: Self-pay | Admitting: Pharmacist

## 2016-10-10 NOTE — Telephone Encounter (Signed)
UTI and started on ciprofloxacin 250mg  BID x 14 days

## 2016-10-13 ENCOUNTER — Ambulatory Visit (INDEPENDENT_AMBULATORY_CARE_PROVIDER_SITE_OTHER): Payer: BC Managed Care – PPO | Admitting: Pharmacist Clinician (PhC)/ Clinical Pharmacy Specialist

## 2016-10-13 DIAGNOSIS — Z7901 Long term (current) use of anticoagulants: Secondary | ICD-10-CM | POA: Diagnosis not present

## 2016-10-13 DIAGNOSIS — Z952 Presence of prosthetic heart valve: Secondary | ICD-10-CM | POA: Diagnosis not present

## 2016-10-13 LAB — POCT INR: INR: 1.9

## 2016-10-31 ENCOUNTER — Ambulatory Visit (INDEPENDENT_AMBULATORY_CARE_PROVIDER_SITE_OTHER): Payer: BC Managed Care – PPO | Admitting: Pharmacist

## 2016-10-31 DIAGNOSIS — Z952 Presence of prosthetic heart valve: Secondary | ICD-10-CM

## 2016-10-31 DIAGNOSIS — Z7901 Long term (current) use of anticoagulants: Secondary | ICD-10-CM

## 2016-10-31 LAB — POCT INR: INR: 3.3

## 2016-11-14 ENCOUNTER — Ambulatory Visit (INDEPENDENT_AMBULATORY_CARE_PROVIDER_SITE_OTHER): Payer: BC Managed Care – PPO | Admitting: Pharmacist Clinician (PhC)/ Clinical Pharmacy Specialist

## 2016-11-14 DIAGNOSIS — Z952 Presence of prosthetic heart valve: Secondary | ICD-10-CM | POA: Diagnosis not present

## 2016-11-14 DIAGNOSIS — Z7901 Long term (current) use of anticoagulants: Secondary | ICD-10-CM | POA: Diagnosis not present

## 2016-11-14 LAB — POCT INR: INR: 4.1

## 2016-12-01 ENCOUNTER — Inpatient Hospital Stay (HOSPITAL_COMMUNITY)
Admission: EM | Admit: 2016-12-01 | Discharge: 2016-12-05 | DRG: 065 | Disposition: A | Discharge status: Home / Self Care | Payer: BC Managed Care – PPO | Attending: Neurology | Admitting: Neurology

## 2016-12-01 ENCOUNTER — Other Ambulatory Visit: Payer: Self-pay

## 2016-12-01 ENCOUNTER — Other Ambulatory Visit (HOSPITAL_COMMUNITY): Payer: Self-pay

## 2016-12-01 ENCOUNTER — Emergency Department (HOSPITAL_COMMUNITY): Payer: BC Managed Care – PPO

## 2016-12-01 ENCOUNTER — Inpatient Hospital Stay (HOSPITAL_COMMUNITY): Payer: BC Managed Care – PPO

## 2016-12-01 DIAGNOSIS — Z952 Presence of prosthetic heart valve: Secondary | ICD-10-CM

## 2016-12-01 DIAGNOSIS — Z7901 Long term (current) use of anticoagulants: Secondary | ICD-10-CM | POA: Diagnosis not present

## 2016-12-01 DIAGNOSIS — I63412 Cerebral infarction due to embolism of left middle cerebral artery: Secondary | ICD-10-CM | POA: Insufficient documentation

## 2016-12-01 DIAGNOSIS — R29703 NIHSS score 3: Secondary | ICD-10-CM | POA: Diagnosis present

## 2016-12-01 DIAGNOSIS — R791 Abnormal coagulation profile: Secondary | ICD-10-CM | POA: Diagnosis present

## 2016-12-01 DIAGNOSIS — Z7982 Long term (current) use of aspirin: Secondary | ICD-10-CM

## 2016-12-01 DIAGNOSIS — H9190 Unspecified hearing loss, unspecified ear: Secondary | ICD-10-CM | POA: Diagnosis present

## 2016-12-01 DIAGNOSIS — Z79899 Other long term (current) drug therapy: Secondary | ICD-10-CM | POA: Diagnosis not present

## 2016-12-01 DIAGNOSIS — E785 Hyperlipidemia, unspecified: Secondary | ICD-10-CM | POA: Diagnosis present

## 2016-12-01 DIAGNOSIS — I130 Hypertensive heart and chronic kidney disease with heart failure and stage 1 through stage 4 chronic kidney disease, or unspecified chronic kidney disease: Secondary | ICD-10-CM | POA: Diagnosis present

## 2016-12-01 DIAGNOSIS — Z7983 Long term (current) use of bisphosphonates: Secondary | ICD-10-CM

## 2016-12-01 DIAGNOSIS — E7800 Pure hypercholesterolemia, unspecified: Secondary | ICD-10-CM | POA: Diagnosis present

## 2016-12-01 DIAGNOSIS — E1122 Type 2 diabetes mellitus with diabetic chronic kidney disease: Secondary | ICD-10-CM | POA: Diagnosis present

## 2016-12-01 DIAGNOSIS — N400 Enlarged prostate without lower urinary tract symptoms: Secondary | ICD-10-CM | POA: Diagnosis present

## 2016-12-01 DIAGNOSIS — I2510 Atherosclerotic heart disease of native coronary artery without angina pectoris: Secondary | ICD-10-CM | POA: Diagnosis present

## 2016-12-01 DIAGNOSIS — R29810 Facial weakness: Secondary | ICD-10-CM | POA: Diagnosis present

## 2016-12-01 DIAGNOSIS — I6340 Cerebral infarction due to embolism of unspecified cerebral artery: Principal | ICD-10-CM | POA: Diagnosis present

## 2016-12-01 DIAGNOSIS — Z85028 Personal history of other malignant neoplasm of stomach: Secondary | ICD-10-CM

## 2016-12-01 DIAGNOSIS — I639 Cerebral infarction, unspecified: Secondary | ICD-10-CM | POA: Diagnosis present

## 2016-12-01 DIAGNOSIS — Z903 Acquired absence of stomach [part of]: Secondary | ICD-10-CM | POA: Diagnosis not present

## 2016-12-01 DIAGNOSIS — I5022 Chronic systolic (congestive) heart failure: Secondary | ICD-10-CM | POA: Diagnosis present

## 2016-12-01 DIAGNOSIS — R4701 Aphasia: Secondary | ICD-10-CM | POA: Diagnosis present

## 2016-12-01 DIAGNOSIS — I10 Essential (primary) hypertension: Secondary | ICD-10-CM | POA: Diagnosis not present

## 2016-12-01 DIAGNOSIS — N183 Chronic kidney disease, stage 3 (moderate): Secondary | ICD-10-CM | POA: Diagnosis present

## 2016-12-01 DIAGNOSIS — Z881 Allergy status to other antibiotic agents status: Secondary | ICD-10-CM | POA: Diagnosis not present

## 2016-12-01 DIAGNOSIS — R471 Dysarthria and anarthria: Secondary | ICD-10-CM | POA: Diagnosis present

## 2016-12-01 DIAGNOSIS — E1159 Type 2 diabetes mellitus with other circulatory complications: Secondary | ICD-10-CM | POA: Diagnosis not present

## 2016-12-01 DIAGNOSIS — Z880 Allergy status to penicillin: Secondary | ICD-10-CM

## 2016-12-01 DIAGNOSIS — Z8249 Family history of ischemic heart disease and other diseases of the circulatory system: Secondary | ICD-10-CM | POA: Diagnosis not present

## 2016-12-01 DIAGNOSIS — I351 Nonrheumatic aortic (valve) insufficiency: Secondary | ICD-10-CM | POA: Diagnosis not present

## 2016-12-01 HISTORY — PX: IR ANGIO INTRA EXTRACRAN SEL COM CAROTID INNOMINATE BILAT MOD SED: IMG5360

## 2016-12-01 HISTORY — PX: IR ANGIO VERTEBRAL SEL SUBCLAVIAN INNOMINATE UNI R MOD SED: IMG5365

## 2016-12-01 LAB — COMPREHENSIVE METABOLIC PANEL
ALBUMIN: 3.6 g/dL (ref 3.5–5.0)
ALK PHOS: 69 U/L (ref 38–126)
ALT: 27 U/L (ref 17–63)
AST: 32 U/L (ref 15–41)
Anion gap: 3 — ABNORMAL LOW (ref 5–15)
BILIRUBIN TOTAL: 1 mg/dL (ref 0.3–1.2)
BUN: 17 mg/dL (ref 6–20)
CALCIUM: 8.4 mg/dL — AB (ref 8.9–10.3)
CO2: 28 mmol/L (ref 22–32)
Chloride: 108 mmol/L (ref 101–111)
Creatinine, Ser: 1.74 mg/dL — ABNORMAL HIGH (ref 0.61–1.24)
GFR calc Af Amer: 42 mL/min — ABNORMAL LOW (ref >60)
GFR calc non Af Amer: 36 mL/min — ABNORMAL LOW (ref >60)
GLUCOSE: 178 mg/dL — AB (ref 65–99)
Potassium: 4.1 mmol/L (ref 3.5–5.1)
Sodium: 139 mmol/L (ref 135–145)
TOTAL PROTEIN: 6.9 g/dL (ref 6.5–8.1)

## 2016-12-01 LAB — DIFFERENTIAL
BASOS ABS: 0.1 10*3/uL (ref 0.0–0.1)
Basophils Relative: 1 %
Eosinophils Absolute: 0.6 10*3/uL (ref 0.0–0.7)
Eosinophils Relative: 10 %
LYMPHS ABS: 1.2 10*3/uL (ref 0.7–4.0)
LYMPHS PCT: 20 %
Monocytes Absolute: 0.5 10*3/uL (ref 0.1–1.0)
Monocytes Relative: 8 %
NEUTROS PCT: 61 %
Neutro Abs: 3.6 10*3/uL (ref 1.7–7.7)

## 2016-12-01 LAB — I-STAT CHEM 8, ED
BUN: 21 mg/dL — AB (ref 6–20)
CALCIUM ION: 1.05 mmol/L — AB (ref 1.15–1.40)
CREATININE: 1.7 mg/dL — AB (ref 0.61–1.24)
Chloride: 104 mmol/L (ref 101–111)
Glucose, Bld: 174 mg/dL — ABNORMAL HIGH (ref 65–99)
HCT: 36 % — ABNORMAL LOW (ref 39.0–52.0)
Hemoglobin: 12.2 g/dL — ABNORMAL LOW (ref 13.0–17.0)
Potassium: 4 mmol/L (ref 3.5–5.1)
Sodium: 142 mmol/L (ref 135–145)
TCO2: 28 mmol/L (ref 22–32)

## 2016-12-01 LAB — CBC
HCT: 37.4 % — ABNORMAL LOW (ref 39.0–52.0)
HEMOGLOBIN: 12.5 g/dL — AB (ref 13.0–17.0)
MCH: 31.3 pg (ref 26.0–34.0)
MCHC: 33.4 g/dL (ref 30.0–36.0)
MCV: 93.5 fL (ref 78.0–100.0)
Platelets: 174 10*3/uL (ref 150–400)
RBC: 4 MIL/uL — AB (ref 4.22–5.81)
RDW: 13 % (ref 11.5–15.5)
WBC: 6 10*3/uL (ref 4.0–10.5)

## 2016-12-01 LAB — PROTIME-INR
INR: 2.31
Prothrombin Time: 25.2 seconds — ABNORMAL HIGH (ref 11.4–15.2)

## 2016-12-01 LAB — APTT: aPTT: 37 seconds — ABNORMAL HIGH (ref 24–36)

## 2016-12-01 LAB — I-STAT TROPONIN, ED: Troponin i, poc: 0.02 ng/mL (ref 0.00–0.08)

## 2016-12-01 MED ORDER — IOPAMIDOL (ISOVUE-370) INJECTION 76%
INTRAVENOUS | Status: AC
  Administered 2016-12-01: 90 mL
  Filled 2016-12-01: qty 100

## 2016-12-01 MED ORDER — LIDOCAINE HCL (PF) 1 % IJ SOLN
INTRAMUSCULAR | Status: AC
  Filled 2016-12-01: qty 30

## 2016-12-01 MED ORDER — IOPAMIDOL (ISOVUE-300) INJECTION 61%
INTRAVENOUS | Status: AC
  Administered 2016-12-01: 50 mL
  Filled 2016-12-01: qty 150

## 2016-12-01 MED ORDER — IOPAMIDOL (ISOVUE-300) INJECTION 61%
INTRAVENOUS | Status: AC
  Filled 2016-12-01: qty 150

## 2016-12-01 MED ORDER — ACETAMINOPHEN 650 MG RE SUPP: 650.0000 mg | RECTAL | Status: DC | PRN

## 2016-12-01 MED ORDER — SENNOSIDES-DOCUSATE SODIUM 8.6-50 MG PO TABS
1.0000 | ORAL_TABLET | Freq: Every evening | ORAL | Status: DC | PRN
  Administered 2016-12-03 – 2016-12-04 (×2): 1 via ORAL
  Filled 2016-12-01 (×2): qty 1

## 2016-12-01 MED ORDER — NICARDIPINE HCL IN NACL 20-0.86 MG/200ML-% IV SOLN
3.0000 mg/h | Freq: Once | INTRAVENOUS | Status: AC
  Administered 2016-12-01: 7.5 mg/h via INTRAVENOUS
  Filled 2016-12-01: qty 200

## 2016-12-01 MED ORDER — SODIUM CHLORIDE 0.9 % IV SOLN
INTRAVENOUS | Status: DC
  Administered 2016-12-01: 22:00:00 via INTRAVENOUS

## 2016-12-01 MED ORDER — MIDAZOLAM HCL 2 MG/2ML IJ SOLN
INTRAMUSCULAR | Status: AC
  Filled 2016-12-01: qty 4

## 2016-12-01 MED ORDER — ACETAMINOPHEN 325 MG PO TABS
650.0000 mg | ORAL_TABLET | ORAL | Status: DC | PRN
  Administered 2016-12-04: 325 mg via ORAL
  Filled 2016-12-01: qty 2

## 2016-12-01 MED ORDER — FENTANYL CITRATE (PF) 100 MCG/2ML IJ SOLN
INTRAMUSCULAR | Status: AC | PRN
  Administered 2016-12-01 (×2): 25 ug via INTRAVENOUS

## 2016-12-01 MED ORDER — NICARDIPINE HCL IN NACL 20-0.86 MG/200ML-% IV SOLN
3.0000 mg/h | INTRAVENOUS | Status: DC
  Administered 2016-12-01: 5 mg/h via INTRAVENOUS
  Administered 2016-12-01 – 2016-12-02 (×2): 2.5 mg/h via INTRAVENOUS
  Administered 2016-12-03: 3 mg/h via INTRAVENOUS
  Administered 2016-12-04: 2 mg/h via INTRAVENOUS
  Filled 2016-12-01 (×4): qty 200

## 2016-12-01 MED ORDER — STROKE: EARLY STAGES OF RECOVERY BOOK
Freq: Once | Status: DC
  Filled 2016-12-01: qty 1

## 2016-12-01 MED ORDER — SODIUM CHLORIDE 0.9 % IV SOLN
INTRAVENOUS | Status: DC
  Administered 2016-12-01 – 2016-12-02 (×2): via INTRAVENOUS

## 2016-12-01 MED ORDER — MIDAZOLAM HCL 2 MG/2ML IJ SOLN
INTRAMUSCULAR | Status: AC | PRN
  Administered 2016-12-01: 0.5 mg via INTRAVENOUS

## 2016-12-01 MED ORDER — HEPARIN (PORCINE) IN NACL 100-0.45 UNIT/ML-% IJ SOLN
700.0000 [IU]/h | INTRAMUSCULAR | Status: DC
  Administered 2016-12-02: 700 [IU]/h via INTRAVENOUS
  Administered 2016-12-03: 750 [IU]/h via INTRAVENOUS
  Administered 2016-12-04: 700 [IU]/h via INTRAVENOUS
  Filled 2016-12-01 (×3): qty 250

## 2016-12-01 MED ORDER — ACETAMINOPHEN 160 MG/5ML PO SOLN: 650.0000 mg | ORAL | Status: DC | PRN

## 2016-12-01 MED ORDER — HYDRALAZINE HCL 20 MG/ML IJ SOLN
INTRAMUSCULAR | Status: AC
  Filled 2016-12-01: qty 1

## 2016-12-01 MED ORDER — ASPIRIN EC 325 MG PO TBEC
325.0000 mg | DELAYED_RELEASE_TABLET | Freq: Every day | ORAL | Status: DC
  Administered 2016-12-02 – 2016-12-05 (×4): 325 mg via ORAL
  Filled 2016-12-01 (×4): qty 1

## 2016-12-01 MED ORDER — HEPARIN SODIUM (PORCINE) 1000 UNIT/ML IJ SOLN
INTRAMUSCULAR | Status: AC
  Filled 2016-12-01: qty 1

## 2016-12-01 MED ORDER — FENTANYL CITRATE (PF) 100 MCG/2ML IJ SOLN
INTRAMUSCULAR | Status: AC
  Filled 2016-12-01: qty 2

## 2016-12-01 NOTE — H&P (Signed)
Reason for Consult: Code stroke Referring Physician: Dr. Ellender Hose  CC:   History is obtained from:pt, chart and wife  HPI: Robert [name] is a 76 y.o. male PMH of HTN HLD, CKD, mechanical mitral valve on Coumadin, last INR 4,  Brought in as a code stroke for right-sided weakness, aphasia and dysarthria that started at 12:45 PM. 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 He went to his office. He spoke with his wife and brother. His wife picked him up from the office and to come to urgent care. At the urgent care he was evaluated and there was a concern for stroke and he was brought into the [facility] emergency room. He is on Coumadin, with his last INR at 4.0. He was not a candidate for TPA because he was outside the window by the time he came here and he was on Coumadin. For EMS, he had right arm and leg drift, right facial droop,some word finding difficulty and slurred speech. He was evaluated at the emergency room bridge and at the time had right facial droop, dysarthria and mild aphasia. He had no arm or leg weakness at the time. No sensory loss.   LKW: 1245 hrs. On 12/01/2016. tpa g ven?: no, on Coumadin therapeutic INR,and outside the window Premorbid modified Rankin scale (mRS):0  ROS: A 14 point ROS was performed and is negative except as noted in the HPI.        Past Medical History:  Diagnosis Date  . BPH (benign prostatic hyperplasia)   . Chronic anticoagulation    coumadin for mechanical valves  . CKD (chronic kidney disease), stage III   . Diabetes mellitus type 2, diet-controlled ([place])   . Gastric cancer ([place]) 2005   s/p gastrectomy  . High cholesterol   . HOH (hard of hearing)   . Hypertension   . Rheumatic heart disease   . S/P MVR (mitral valve replacement) 2015   65mm StJude mechanical valve          Family History  Problem  Relation Age of Onset  . Hypertension Mother   . Diabetes Mother   . Cancer Brother      Social History:   reports that he has never smoked. He has never used smokeless tobacco. He reports that he does not drink alcohol or use drugs.  Medications  Current Facility-Administered Medications:  .  nicardipine (CARDENE) 20mg  in 0.86% saline 284ml IV infusion (0.1 mg/ml), 3-15 mg/hr, Intravenous, Once, Amie Portland, MD  Current Outpatient Prescriptions:  .  aspirin 81 MG chewable tablet, Chew 81 mg by mouth daily., Disp: , Rfl:  .  atorvastatin (LIPITOR) 20 MG tablet, Take 1 tablet (20 mg total) by mouth at bedtime., Disp: 90 tablet, Rfl: 3 .  carvedilol (COREG) 6.25 MG tablet, TAKE 1 TABLET BY MOUTH TWICE A DAY WITH A MEAL, Disp: 180 tablet, Rfl: 3 .  ferrous sulfate 325 (65 FE) MG tablet, Take 1 tablet (325 mg total) by mouth 2 (two) times daily with a meal., Disp: 30 tablet, Rfl: 3 .  furosemide (LASIX) 40 MG tablet, Take 1 tablet (40 mg total) by mouth 2 (two) times daily., Disp: 180 tablet, Rfl: 1 .  isosorbide mononitrate (IMDUR) 30 MG 24 hr tablet, Take 1 tablet (30 mg total) by mouth daily., Disp: 90 tablet, Rfl: 3 .  Multiple Vitamin (MULTIVITAMIN WITH MINERALS)  TABS tablet, Take 1 tablet by mouth daily., Disp: , Rfl:  .  tamsulosin (FLOMAX) 0.4 MG CAPS capsule, TAKE 1 CAPSULE (0.4 MG TOTAL) BY MOUTH DAILY AFTER SUPPER., Disp: 30 capsule, Rfl: 10 .  Thiamine Mononitrate (VITAMIN B1 PO), Take 1 tablet by mouth daily. , Disp: , Rfl:  .  vitamin B-12 (CYANOCOBALAMIN) 1000 MCG tablet, Take 1 tablet (1,000 mcg total) by mouth daily., Disp: 7 tablet, Rfl: 0 .  warfarin (COUMADIN) 3 MG tablet, Take 3mg  (1 tablet) every day except for 6mg  (2 tablets) every Thursday as directed by coumadin clinic., Disp: 100 tablet, Rfl: 1 .  warfarin (COUMADIN) 3 MG tablet, Take 2 tabletd (6mg ) every Thursday and 1 tablet (3mg ) all other days of the week or as directed by coumadin clinic., Disp: 100  tablet, Rfl: 1   Exam: Current vital signs: BP (!) 232/99   Pulse 70   Temp 97.8 F (36.6 C) (Oral)   Resp 12   SpO2 99%  Vital signs in last 24 hours: Temp:  [97.8 F (36.6 C)] 97.8 F (36.6 C) (09/21 1753) Pulse Rate:  [70] 70 (09/21 1753) Resp:  [12] 12 (09/21 1753) BP: (232)/(99) 232/99 (09/21 1753) SpO2:  [99 %] 99 % (09/21 1753)  GENERAL: Awake, alert in NAD HEENT: - Normocephalic and atraumatic, dry mm, no LN++, no Thyromegally LUNGS - Clear to auscultation bilaterally with no wheezes CV - S1S2 RRR, no m/r/g, equal pulses bilaterally. ABDOMEN - Soft, nontender, nondistended with normoactive BS Ext: warm, well perfused, intact peripheral pulses,no edema  NEURO:  Mental Status: AA&Ox3  Language: speech is Mildly dysarthric.  Naming is mildly impaired, but repetition, fluency, and comprehension intact. Cranial Nerves: PERRL 42mm/brisk. EOMI, visual fields full, Rt facial weakness as shown by flattening of Rt NLF, facial sensation intact, hearing intact, tongue/uvula/soft palate midline, normal sternocleidomastoid and trapezius muscle strength. No evidence of tongue atrophy or fibrillations Motor: nearly symmetric 5/5 in all 4s. Tone: is normal and bulk is normal Sensation- Intact to light touch bilaterally Coordination: FTN intact bilaterally, no ataxia in BLE. Gait- deferred  NIHSS 1a Level of Conscious.: 0 1b LOC Questions: 0 1c LOC Commands: 0 2 Best Gaze: 0 3 Visual: 0 4 Facial Palsy: 1 5a Motor Arm - left: 0 5b Motor Arm - Right: 0 6a Motor Leg - Left: 0 6b Motor Leg - Right: 0 7 Limb Ataxia: 0 8 Sensory: 0 9 Best Language: 1 10 Dysarthria: 1 11 Extinct. and Inatten.: 0 TOTAL: 3  Labs I have reviewed labs in epic and the results pertinent to this consultation are:   CBC Labs (Brief)          Component Value Date/Time   WBC 6.0 12/01/2016 1717   RBC 4.00 (L) 12/01/2016 1717   HGB 12.2 (L) 12/01/2016 1722   HCT 36.0 (L) 12/01/2016  1722   PLT 174 12/01/2016 1717   MCV 93.5 12/01/2016 1717   MCH 31.3 12/01/2016 1717   MCHC 33.4 12/01/2016 1717   RDW 13.0 12/01/2016 1717   LYMPHSABS 1.2 12/01/2016 1717   MONOABS 0.5 12/01/2016 1717   EOSABS 0.6 12/01/2016 1717   BASOSABS 0.1 12/01/2016 1717      CMP     Labs (Brief)          Component Value Date/Time   NA 142 12/01/2016 1722   K 4.0 12/01/2016 1722   CL 104 12/01/2016 1722   CO2 31 06/06/2016 1006   GLUCOSE 174 (H) 12/01/2016 1722  BUN 21 (H) 12/01/2016 1722   CREATININE 1.70 (H) 12/01/2016 1722   CREATININE 2.06 (H) 06/06/2016 1006   CALCIUM 8.7 06/06/2016 1006   PROT 6.4 (L) 09/02/2015 0535   ALBUMIN 2.6 (L) 09/03/2015 0349   AST 26 09/02/2015 0535   ALT 22 09/02/2015 0535   ALKPHOS 51 09/02/2015 0535   BILITOT 1.9 (H) 09/02/2015 0535   GFRNONAA 43 (L) 09/03/2015 0349   GFRAA 50 (L) 09/03/2015 0349      Lipid Panel  Labs (Brief)          Component Value Date/Time   CHOL 148 08/29/2015 0414   TRIG 102 09/01/2015 0515   HDL 45 08/29/2015 0414   CHOLHDL 3.3 08/29/2015 0414   VLDL 11 08/29/2015 0414   LDLCALC 92 08/29/2015 0414       Imaging I have reviewed the images obtained:  CT-scan of the brain - shows no acute changes. Aspects 10. Chronic small vessel disease versus ischemia and bilateral cerebellar hemispheres and in the caudates CT angiogram of the head and CT perfusion study-no emergent large vessel occlusion in the neck. There is positive flow after the left M1 bifurcation in the posterior division of the M2. There is mismatch on the rapid perfusion sequences with a 31 mL of ischemia 0 mL of infarct  Assessment:  Patient is a61 year oldman with a past medical history of hypertension, hyperlipidemia, chronic kidney disease, mechanical mitral valve on Coumadin, who was brought in as a code stroke for right-sided weakness and aphasia and dysarthria. On my evaluation, his NIH stroke scale  was 3. His noncontrast head CT shows an aspects of 10. His CT A/perfusion shows a perfusion mismatch with 31 mL of area at risk with no core in the left MCA territory corresponding to the posterior division of the left MCA. I discussed his clinical case and imaging findings with the radiologist on call as well as interventionalist Dr. Estanislado Pandy for a possible diagnostic cerebral angiogram with an intent to do thrombectomy if the clot is proximal enough. This was also discussed with the patient's brother, who is a Energy manager in [place]. All his questions were answered. Risks of worsening stroke, bleeding and death were explained. Case was rediscussed with the neuro interventional radiologist and decision was made to pursue diagnostic cerebral angiogram.  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 Likely embolic. Patient on Coumadin for mechanical heart valve. Hypertension Hyperlipidemia Chronic kidney disease   Recommendations: At this time, the patient will be taken in for a diagnostic cerebral angiogram. Further recommendationswill be based on the results of the diagnostic angiogram and if any intervention is performed.  Patient's care is being handed over to Dr. Nicole Kindred, who is the night neuro-hospitalist on site.  In either case, following work up is recommended: HgbA1c, fasting lipid panel MRIbrain w/o contrast Frequent neuro checks Echocardiogram Prophylactic therapy -Antiplatelet med: Aspirin - dose 325mg  PO or 300mg  PR after angio Risk factor modification Telemetry monitoring PT consult, OT consult, Speech consult  Admit to ICU - stroke service.  We will follow. -- Amie Portland, MD [facility] [phone]  If 7pm to 7am, please call on call as listed on AMION.

## 2016-12-01 NOTE — Procedures (Signed)
S/P bilateral common carotid  Arteriograms. RT CFA approach. Findings. 1.Occluded distal parietal  M3   branch of inferior division  of Lt MCA.

## 2016-12-01 NOTE — Sedation Documentation (Addendum)
R Fem sheath to remain d/t elevated INR. Groin level 0, Occlusive pressure bandage placed, CDI. Sheath transduced with good waveform.

## 2016-12-01 NOTE — Progress Notes (Signed)
eLink Physician-Brief Progress Note Patient Name: Robert [name] DOB: 05/03/1940 MRN: [id number]   Date of Service  12/01/2016  HPI/Events of Note  Patient admitted with code stroke. Underwent bilateral angiograms. Found to have distal MCA M3 occlusion. Camera shows patient laying fully recumbent. Respiratory status stable. Vitals stable.   eICU Interventions  Continuing plan of care as per admitting physician.      Intervention Category Evaluation Type: New Patient Evaluation  Tera Partridge 12/01/2016, 9:02 PM

## 2016-12-01 NOTE — Sedation Documentation (Signed)
R Groin level 0, drsg CDI, sheath transduced with good wave form.

## 2016-12-01 NOTE — Consult Note (Deleted)
Neurology Consultation  Reason for Consult: Code stroke Referring Physician: Dr. Ellender Hose  CC:   History is obtained from:pt, chart and wife  HPI: Robert [name] is a 76 y.o. male PMH of HTN HLD, CKD, mechanical mitral valve on Coumadin, last INR 4,  Brought in as a code stroke for right-sided weakness, aphasia and dysarthria that started at 12:45 PM. 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 He went to his office. He spoke with his wife and brother. His wife picked him up from the office and to come to urgent care. At the urgent care he was evaluated and there was a concern for stroke and he was brought into the [facility] emergency room. He is on Coumadin, with his last INR at 4.0. He was not a candidate for TPA because he was outside the window by the time he came here and he was on Coumadin. For EMS, he had right arm and leg drift, right facial droop,some word finding difficulty and slurred speech. He was evaluated at the emergency room bridge and at the time had right facial droop, dysarthria and mild aphasia. He had no arm or leg weakness at the time. No sensory loss.   LKW: 1245 hrs. On 12/01/2016. tpa g ven?: no, on Coumadin therapeutic INR,and outside the window Premorbid modified Rankin scale (mRS):0  ROS: A 14 point ROS was performed and is negative except as noted in the HPI.    Past Medical History:  Diagnosis Date  . BPH (benign prostatic hyperplasia)   . Chronic anticoagulation    coumadin for mechanical valves  . CKD (chronic kidney disease), stage III   . Diabetes mellitus type 2, diet-controlled ([place])   . Gastric cancer ([place]) 2005   s/p gastrectomy  . High cholesterol   . HOH (hard of hearing)   . Hypertension   . Rheumatic heart disease   . S/P MVR (mitral valve replacement) 2015   61mm StJude mechanical valve     Family History  Problem Relation Age  of Onset  . Hypertension Mother   . Diabetes Mother   . Cancer Brother      Social History:   reports that he has never smoked. He has never used smokeless tobacco. He reports that he does not drink alcohol or use drugs.  Medications  Current Facility-Administered Medications:  .  nicardipine (CARDENE) 20mg  in 0.86% saline 235ml IV infusion (0.1 mg/ml), 3-15 mg/hr, Intravenous, Once, Amie Portland, MD  Current Outpatient Prescriptions:  .  aspirin 81 MG chewable tablet, Chew 81 mg by mouth daily., Disp: , Rfl:  .  atorvastatin (LIPITOR) 20 MG tablet, Take 1 tablet (20 mg total) by mouth at bedtime., Disp: 90 tablet, Rfl: 3 .  carvedilol (COREG) 6.25 MG tablet, TAKE 1 TABLET BY MOUTH TWICE A DAY WITH A MEAL, Disp: 180 tablet, Rfl: 3 .  ferrous sulfate 325 (65 FE) MG tablet, Take 1 tablet (325 mg total) by mouth 2 (two) times daily with a meal., Disp: 30 tablet, Rfl: 3 .  furosemide (LASIX) 40 MG tablet, Take 1 tablet (40 mg total) by mouth 2 (two) times daily., Disp: 180 tablet, Rfl: 1 .  isosorbide mononitrate (IMDUR) 30 MG 24 hr tablet, Take 1 tablet (30 mg total) by mouth daily., Disp: 90 tablet, Rfl: 3 .  Multiple Vitamin (MULTIVITAMIN WITH MINERALS) TABS tablet, Take 1 tablet by  mouth daily., Disp: , Rfl:  .  tamsulosin (FLOMAX) 0.4 MG CAPS capsule, TAKE 1 CAPSULE (0.4 MG TOTAL) BY MOUTH DAILY AFTER SUPPER., Disp: 30 capsule, Rfl: 10 .  Thiamine Mononitrate (VITAMIN B1 PO), Take 1 tablet by mouth daily. , Disp: , Rfl:  .  vitamin B-12 (CYANOCOBALAMIN) 1000 MCG tablet, Take 1 tablet (1,000 mcg total) by mouth daily., Disp: 7 tablet, Rfl: 0 .  warfarin (COUMADIN) 3 MG tablet, Take 3mg  (1 tablet) every day except for 6mg  (2 tablets) every Thursday as directed by coumadin clinic., Disp: 100 tablet, Rfl: 1 .  warfarin (COUMADIN) 3 MG tablet, Take 2 tabletd (6mg ) every Thursday and 1 tablet (3mg ) all other days of the week or as directed by coumadin clinic., Disp: 100 tablet, Rfl:  1   Exam: Current vital signs: BP (!) 232/99   Pulse 70   Temp 97.8 F (36.6 C) (Oral)   Resp 12   SpO2 99%  Vital signs in last 24 hours: Temp:  [97.8 F (36.6 C)] 97.8 F (36.6 C) (09/21 1753) Pulse Rate:  [70] 70 (09/21 1753) Resp:  [12] 12 (09/21 1753) BP: (232)/(99) 232/99 (09/21 1753) SpO2:  [99 %] 99 % (09/21 1753)  GENERAL: Awake, alert in NAD HEENT: - Normocephalic and atraumatic, dry mm, no LN++, no Thyromegally LUNGS - Clear to auscultation bilaterally with no wheezes CV - S1S2 RRR, no m/r/g, equal pulses bilaterally. ABDOMEN - Soft, nontender, nondistended with normoactive BS Ext: warm, well perfused, intact peripheral pulses,no edema  NEURO:  Mental Status: AA&Ox3  Language: speech is Mildly dysarthric.  Naming is mildly impaired, but repetition, fluency, and comprehension intact. Cranial Nerves: PERRL 47mm/brisk. EOMI, visual fields full, Rt facial weakness as shown by flattening of Rt NLF, facial sensation intact, hearing intact, tongue/uvula/soft palate midline, normal sternocleidomastoid and trapezius muscle strength. No evidence of tongue atrophy or fibrillations Motor: nearly symmetric 5/5 in all 4s. Tone: is normal and bulk is normal Sensation- Intact to light touch bilaterally Coordination: FTN intact bilaterally, no ataxia in BLE. Gait- deferred  NIHSS 1a Level of Conscious.: 0 1b LOC Questions: 0 1c LOC Commands: 0 2 Best Gaze: 0 3 Visual: 0 4 Facial Palsy: 1 5a Motor Arm - left: 0 5b Motor Arm - Right: 0 6a Motor Leg - Left: 0 6b Motor Leg - Right: 0 7 Limb Ataxia: 0 8 Sensory: 0 9 Best Language: 1 10 Dysarthria: 1 11 Extinct. and Inatten.: 0 TOTAL: 3  Labs I have reviewed labs in epic and the results pertinent to this consultation are:   CBC    Component Value Date/Time   WBC 6.0 12/01/2016 1717   RBC 4.00 (L) 12/01/2016 1717   HGB 12.2 (L) 12/01/2016 1722   HCT 36.0 (L) 12/01/2016 1722   PLT 174 12/01/2016 1717   MCV 93.5  12/01/2016 1717   MCH 31.3 12/01/2016 1717   MCHC 33.4 12/01/2016 1717   RDW 13.0 12/01/2016 1717   LYMPHSABS 1.2 12/01/2016 1717   MONOABS 0.5 12/01/2016 1717   EOSABS 0.6 12/01/2016 1717   BASOSABS 0.1 12/01/2016 1717    CMP     Component Value Date/Time   NA 142 12/01/2016 1722   K 4.0 12/01/2016 1722   CL 104 12/01/2016 1722   CO2 31 06/06/2016 1006   GLUCOSE 174 (H) 12/01/2016 1722   BUN 21 (H) 12/01/2016 1722   CREATININE 1.70 (H) 12/01/2016 1722   CREATININE 2.06 (H) 06/06/2016 1006   CALCIUM 8.7 06/06/2016 1006  PROT 6.4 (L) 09/02/2015 0535   ALBUMIN 2.6 (L) 09/03/2015 0349   AST 26 09/02/2015 0535   ALT 22 09/02/2015 0535   ALKPHOS 51 09/02/2015 0535   BILITOT 1.9 (H) 09/02/2015 0535   GFRNONAA 43 (L) 09/03/2015 0349   GFRAA 50 (L) 09/03/2015 0349    Lipid Panel     Component Value Date/Time   CHOL 148 08/29/2015 0414   TRIG 102 09/01/2015 0515   HDL 45 08/29/2015 0414   CHOLHDL 3.3 08/29/2015 0414   VLDL 11 08/29/2015 0414   LDLCALC 92 08/29/2015 0414     Imaging I have reviewed the images obtained:  CT-scan of the brain - shows no acute changes. Aspects 10. Chronic small vessel disease versus ischemia and bilateral cerebellar hemispheres and in the caudates CT angiogram of the head and CT perfusion study-no emergent large vessel occlusion in the neck. There is positive flow after the left M1 bifurcation in the posterior division of the M2. There is mismatch on the rapid perfusion sequences with a 31 mL of ischemia 0 mL of infarct  Assessment:  Patient is a22 year oldman with a past medical history of hypertension, hyperlipidemia, chronic kidney disease, mechanical mitral valve on Coumadin, who was brought in as a code stroke for right-sided weakness and aphasia and dysarthria. On my evaluation, his NIH stroke scale was 3. His noncontrast head CT shows an aspects of 10. His CT A/perfusion shows a perfusion mismatch with 31 mL of area at risk with no  core in the left MCA territory corresponding to the posterior division of the left MCA. I discussed his clinical case and imaging findings with the radiologist on call as well as interventionalist Dr. Estanislado Pandy for a possible diagnostic cerebral angiogram with an intent to do thrombectomy if the clot is proximal enough. This was also discussed with the patient's brother, who is a Energy manager in [place]. All his questions were answered. Risks of worsening stroke, bleeding and death were explained. Case was rediscussed with the neuro interventional radiologist and decision was made to pursue diagnostic cerebral angiogram.  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 Likely embolic. Patient on Coumadin for mechanical heart valve. Hypertension Hyperlipidemia Chronic kidney disease   Recommendations: At this time, the patient will be taken in for a diagnostic cerebral angiogram. Further recommendationswill be based on the results of the diagnostic angiogram and if any intervention is performed. If the angio shows a thrombus in a accessible location for thrombectomy and a thrombectomy was performed successfully, the patient will be admitted to the stroke service. Otherwise the patient will be admitted to the hospitalist service for further stroke workup.  Patient's care is being handed over to Dr. Nicole Kindred, who is the night neuro-hospitalist on site.  In either case, following work up is recommended: HgbA1c, fasting lipid panel MRIbrain w/o contrast Frequent neuro checks Echocardiogram Prophylactic therapy -Antiplatelet med: Aspirin - dose 325mg  PO or 300mg  PR after angio Risk factor modification Telemetry monitoring PT consult, OT consult, Speech consult -- Amie Portland, MD [facility] [phone]  If 7pm to 7am, please call on call as listed on AMION.

## 2016-12-01 NOTE — Sedation Documentation (Signed)
Pt transported to ED room 42. Accepting nurse Ronalee Belts and I assessed pt R groin. Level 0, drsg CDI. 4+RDP, 1+RPT.

## 2016-12-01 NOTE — Code Documentation (Signed)
Patient is a Educational psychologist at [employer].  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  He then went to his office and had difficulty moving his right leg and difficulty finding his words.  Last Known well at 12:45pm today.  He drove to an urgent care in [place], where they alerted EMS who called the code stroke at 1704.  Upon EMS assessment he had mild right arm weakness and mod right leg weakness, facial droop, slurred speech and mild aphasia.  He arrived at 1711 Stat labs and stat head ct done.  CTA and CT perfusion done.  Patient on Coumadin.  INR 2.3 Patient transported to IR for diagnostic angio and possible intervention.  Cardene started to keep SBP less than 220. Dr Rory Percy at bedside to assess patient.   Wife at bedside

## 2016-12-01 NOTE — ED Triage Notes (Addendum)
Pt arrived vis [facility] after pt began experiencing right sided weakness with right sided facial droop while at work. Pt is unable verbalize his thoughts. Speech is difficult to understand. Pt is able to follow instructions and commands.

## 2016-12-01 NOTE — ED Provider Notes (Signed)
[facility] DEPT Provider Note   CSN: [id number] Arrival date & time: 12/01/16  1711     History   Chief Complaint Chief Complaint  Patient presents with  . Code Stroke    HPI SANDON YOHO is a 76 y.o. male.  HPI  76 year old male with extensive past medical history as below including rheumatic heart disease status post mitral valve replacement, on chronic anticoagulation, coronary artery disease and systolic congestive heart failure followed by Dr. Debara Pickett, who presents with code stroke.  Patient was reportedly normal this morning. 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 Last known normal was approximately 4 hours ago. A student came to ask him a question and he was unable to speak. They called the patient's wife who arrived, and EMS was called. Patient was activated as a code stroke. He has had ongoing speech difficulty and weakness since the onset of the symptoms. Of note, he has a mitral valve replacement but has had supratherapeutic INRs.  Level 5 caveat invoked as remainder of history, ROS, and physical exam limited due to patient's aphasia.  Past Medical History:  Diagnosis Date  . BPH (benign prostatic hyperplasia)   . Chronic anticoagulation    coumadin for mechanical valves  . CKD (chronic kidney disease), stage III   . Diabetes mellitus type 2, diet-controlled ([place])   . Gastric cancer ([place]) 2005   s/p gastrectomy  . High cholesterol   . HOH (hard of hearing)   . Hypertension   . Rheumatic heart disease   . S/P MVR (mitral valve replacement) 2015   20mm StJude mechanical valve    Patient Active Problem List   Diagnosis Date Noted  . Cerebral embolism with cerebral infarction 12/01/2016  . Acute ischemic stroke ([place]) 12/01/2016  . Acute systolic congestive heart failure, NYHA class 3 ([place]) 04/18/2016  . DOE (dyspnea on exertion) 04/07/2016  . Abnormal EKG 10/18/2015   . AKI (acute kidney injury) ([place])   . BPH (benign prostatic hyperplasia) 08/29/2015  . Elevated troponin 08/29/2015  . SBO (small bowel obstruction) ([place]) 08/29/2015  . Pancreatitis 08/29/2015  . Protein-calorie malnutrition, severe ([place]) 08/29/2015  . HLD (hyperlipidemia) 08/29/2015  . Noncompliance with medications 05/26/2015  . Long term current use of anticoagulant therapy 05/18/2015  . Coronary artery disease involving native coronary artery of native heart without angina pectoris   . Chronic systolic (congestive) heart failure ([place]) 05/06/2015  . Hypertensive urgency 05/06/2015  . CKD (chronic kidney disease) stage 3, GFR 30-59 ml/min 05/06/2015  . Chronic anemia 05/06/2015  . H/O mitral valve replacement with mechanical valve 05/06/2015  . CHF (congestive heart failure) ([place]) 05/06/2015    Past Surgical History:  Procedure Laterality Date  . CARDIAC CATHETERIZATION N/A 05/10/2015   Procedure: Right/Left Heart Cath and Coronary Angiography;  Surgeon: Burnell Blanks, MD;  Location: [facility];  Service: Cardiovascular;  Laterality: N/A;  . HERNIA REPAIR    . MITRAL VALVE REPLACEMENT  10/24/2013   90mm StJude mechanical valve  . PARTIAL GASTRECTOMY  2005       Home Medications    Prior to Admission medications   Medication Sig Start Date End Date Taking? Authorizing Provider  aspirin EC 81 MG tablet Take 81 mg by mouth daily.   Yes [provider]  atorvastatin (LIPITOR) 20 MG tablet Take 1 tablet (20 mg total) by mouth at bedtime. 04/07/16  Yes Hilty,  Nadean Corwin, MD  carvedilol (COREG) 6.25 MG tablet TAKE 1 TABLET BY MOUTH TWICE A DAY WITH A MEAL Patient taking differently: Take 6.25 mg by mouth 2 (two) times daily with a meal.  04/07/16  Yes Hilty, Nadean Corwin, MD  chlorhexidine (PERIDEX) 0.12 % solution by Mouth Rinse route See admin instructions. Swish and spit small amount by mouth daily at bedtime 11/29/16  Yes [provider]  ferrous  sulfate 325 (65 FE) MG tablet Take 1 tablet (325 mg total) by mouth 2 (two) times daily with a meal. 05/13/15  Yes Regalado, Belkys A, MD  furosemide (LASIX) 20 MG tablet Take 20 mg by mouth daily. 11/29/16  Yes [provider]  isosorbide mononitrate (IMDUR) 30 MG 24 hr tablet Take 1 tablet (30 mg total) by mouth daily. 04/07/16  Yes Hilty, Nadean Corwin, MD  Multiple Vitamin (MULTIVITAMIN WITH MINERALS) TABS tablet Take 1 tablet by mouth daily.   Yes [provider]  tamsulosin (FLOMAX) 0.4 MG CAPS capsule TAKE 1 CAPSULE (0.4 MG TOTAL) BY MOUTH DAILY AFTER SUPPER. 06/14/16  Yes Hilty, Nadean Corwin, MD  Thiamine Mononitrate (VITAMIN B1 PO) Take 1 tablet by mouth daily.    Yes [provider]  vitamin B-12 (CYANOCOBALAMIN) 1000 MCG tablet Take 1 tablet (1,000 mcg total) by mouth daily. 05/13/15  Yes Regalado, Belkys A, MD  warfarin (COUMADIN) 3 MG tablet Take 3mg  (1 tablet) every day except for 6mg  (2 tablets) every Thursday as directed by coumadin clinic. Patient taking differently: Take 3-6 mg by mouth at bedtime. Take 2 tablets (6 mg) by mouth on Thursdays at bedtime, take 1 tablet (3 mg) on all other nights of the week or as directed by coumadin clinic. 06/14/16  Yes Hilty, Nadean Corwin, MD  furosemide (LASIX) 40 MG tablet Take 1 tablet (40 mg total) by mouth 2 (two) times daily. Patient not taking: Reported on 12/01/2016 06/14/16   Pixie Casino, MD  warfarin (COUMADIN) 3 MG tablet Take 2 tabletd (6mg ) every Thursday and 1 tablet (3mg ) all other days of the week or as directed by coumadin clinic. Patient not taking: Reported on 12/01/2016 09/06/16   Pixie Casino, MD    Family History Family History  Problem Relation Age of Onset  . Hypertension Mother   . Diabetes Mother   . Cancer Brother     Social History Social History  Substance Use Topics  . Smoking status: Never Smoker  . Smokeless tobacco: Never Used  . Alcohol use No     Allergies   Penicillins and  Vancomycin   Review of Systems Review of Systems  Unable to perform ROS: Mental status change  Neurological: Positive for facial asymmetry, speech difficulty and weakness.  All other systems reviewed and are negative.    Physical Exam Updated Vital Signs BP 138/75   Pulse 84   Temp 97.8 F (36.6 C) (Oral)   Resp 12   Wt 58.9 kg (129 lb 13.6 oz)   SpO2 98%   BMI 20.96 kg/m   Physical Exam  Constitutional: He appears well-developed and well-nourished. No distress.  HENT:  Head: Normocephalic and atraumatic.  Eyes: Conjunctivae are normal.  Neck: Neck supple.  Cardiovascular: Normal rate, regular rhythm and normal heart sounds.  Exam reveals no friction rub.   No murmur heard. Pulmonary/Chest: Effort normal and breath sounds normal. No respiratory distress. He has no wheezes. He has no rales.  Abdominal: He exhibits no distension.  Musculoskeletal: He exhibits no edema.  Skin: Skin is warm. Capillary refill takes less than 2 seconds.  Psychiatric: He has a normal mood and affect.  Nursing note and vitals reviewed.   Neurological Exam:  Mental Status: Alert and oriented to person, place, and time. Attention and concentration normal. Speech dysarthric. Naming impaired. Recent memory is intact. Cranial Nerves: Visual fields grossly intact. EOMI and PERRLA. No nystagmus noted. Right facial weakness. Tongue midline, uvula midline. Motor: Muscle strength 5/5 in proximal and distal UE and LE bilaterally. Right pronator drift with subjective right-sided weakness in RUE and RLE. Muscle tone normal. Sensation: Intact to light touch in upper and lower extremities distally bilaterally.  Gait: Deferred   ED Treatments / Results  Labs (all labs ordered are listed, but only abnormal results are displayed) Labs Reviewed  PROTIME-INR - Abnormal; Notable for the following:       Result Value   Prothrombin Time 25.2 (*)    All other components within normal limits  APTT - Abnormal;  Notable for the following:    aPTT 37 (*)    All other components within normal limits  CBC - Abnormal; Notable for the following:    RBC 4.00 (*)    Hemoglobin 12.5 (*)    HCT 37.4 (*)    All other components within normal limits  COMPREHENSIVE METABOLIC PANEL - Abnormal; Notable for the following:    Glucose, Bld 178 (*)    Creatinine, Ser 1.74 (*)    Calcium 8.4 (*)    GFR calc non Af Amer 36 (*)    GFR calc Af Amer 42 (*)    Anion gap 3 (*)    All other components within normal limits  I-STAT CHEM 8, ED - Abnormal; Notable for the following:    BUN 21 (*)    Creatinine, Ser 1.70 (*)    Glucose, Bld 174 (*)    Calcium, Ion 1.05 (*)    Hemoglobin 12.2 (*)    HCT 36.0 (*)    All other components within normal limits  MRSA PCR SCREENING  DIFFERENTIAL  HEMOGLOBIN A1C  LIPID PANEL  CBC  BASIC METABOLIC PANEL  HEPARIN LEVEL (UNFRACTIONATED)  PROTIME-INR  I-STAT TROPONIN, ED  CBG MONITORING, ED    EKG  EKG Interpretation None       Radiology Ct Angio Head W Or Wo Contrast  Result Date: 12/01/2016 CLINICAL DATA:  Initial evaluation for acute slurred speech, right-sided weakness, aphasia. EXAM: CT ANGIOGRAPHY HEAD AND NECK CT PERFUSION BRAIN TECHNIQUE: Multidetector CT imaging of the head and neck was performed using the standard protocol during bolus administration of intravenous contrast. Multiplanar CT image reconstructions and MIPs were obtained to evaluate the vascular anatomy. Carotid stenosis measurements (when applicable) are obtained utilizing NASCET criteria, using the distal internal carotid diameter as the denominator. Multiphase CT imaging of the brain was performed following IV bolus contrast injection. Subsequent parametric perfusion maps were calculated using RAPID software. CONTRAST:  90 cc of Isovue 370. COMPARISON:  Comparison made with previous noncontrast head CT from earlier same day. FINDINGS: CTA NECK FINDINGS Aortic arch: Visualized aortic arch of  normal caliber with normal 3 vessel morphology. Scattered atheromatous plaque within the arch itself and about the origin of the great vessels without flow-limiting stenosis. Visualized subclavian artery is patent without flow-limiting stenosis. Right carotid system: Scattered a centric plaque within the right common carotid artery without stenosis. No significant atheromatous narrowing about the right carotid bifurcation. Right ICA widely patent distally to the skullbase. Left carotid  system: Scattered a centric plaque within the left common carotid artery without stenosis. No significant atheromatous narrowing about the left carotid bifurcation. Left ICA patent distally to the skullbase without flow-limiting stenosis. Vertebral arteries: Both vertebral arteries arise from the subclavian arteries. Left vertebral artery dominant. Mild plaque at the origin of the vertebral artery's bilaterally without significant stenosis. Vertebral arteries otherwise widely patent within the neck. Skeleton: No acute osseus abnormality. No worrisome lytic or blastic osseous lesions. Advanced degenerative spondylolysis noted at C3-4 through C6-7. Other neck: No acute soft tissue abnormality within the neck. Salivary glands normal. No adenopathy. Subcentimeter hypodense left thyroid nodule noted, of doubtful significance. Thyroid otherwise unremarkable. Lower right internal jugular vein is patulous. Upper chest: Visualized upper chest demonstrates no acute abnormality. Partially visualized lungs are clear. Review of the MIP images confirms the above findings CTA HEAD FINDINGS Anterior circulation: Internal carotid artery's patent through the termini without flow-limiting stenosis. Scattered atheromatous plaque noted within the cavernous/supraclinoid ICAs bilaterally. ICA termini themselves are widely patent. A1 segments widely patent. Normal anterior communicating artery. Anterior cerebral arteries patent to their distal aspects  without stenosis. Left M1 segment patent without stenosis or occlusion. Left MCA bifurcation normal. No proximal M2 occlusion. There is abrupt occlusion and/or near occlusion of distal left M2/M3 branches (series 13, image 129 on coronal sequence, series 12, image 88 on axial sequence). There is contrast opacification of left MCA branches distally, favored to be related to subocclusive clot, although occlusive clot with associated collateral flow could also be considered. Left MCA branches otherwise patent. Right M1 segment widely patent. No proximal right M2 stenosis or occlusion. Distal right MCA branches well perfused. Posterior circulation: Dominant left vertebral artery widely patent to the vertebrobasilar junction. Hypoplastic right vertebral artery patent as well without stenosis. Patent posterior inferior cerebral arteries. Basilar artery widely patent to its distal aspect. Superior cerebral arteries patent bilaterally. PCAs both arise from the basilar artery and are widely patent to their distal aspects without stenosis. Venous sinuses: Patent. Anatomic variants: None significant. Delayed phase: Not performed. Review of the MIP images confirms the above findings CT Brain Perfusion Findings: CBF (<30%) Volume: 55mL Perfusion (Tmax>6.0s) volume: 71mL Mismatch Volume: 65mL Infarction Location:Confluent perfusion mismatch at the posterior left MCA distribution at the left frontoparietal region without core infarct. Finding consistent with subocclusive distal left M2/M3 branch identified on CTA. IMPRESSION: 1. Subocclusive distal left M2/M3 thrombus with associated delayed perfusion within the posterior left MCA territory without core infarct. As discussed above, thrombus is favored to be sub occlusive in nature, although collateral flow may be contributing to distal MCA branch opacification seen beyond the defect. 2. Otherwise negative CTA without large or proximal arterial branch occlusion. No other high-grade  or correctable stenosis. 3. Mild aortic arch, common carotid, carotid siphon calcifications without flow-limiting stenosis. Critical Value/emergent results were called by telephone at the time of interpretation on 12/01/2016 at 6:01 pm to Dr. Amie Portland , who verbally acknowledged these results. Electronically Signed   By: Jeannine Boga M.D.   On: 12/01/2016 18:32   Ct Angio Neck W Or Wo Contrast  Result Date: 12/01/2016 CLINICAL DATA:  Initial evaluation for acute slurred speech, right-sided weakness, aphasia. EXAM: CT ANGIOGRAPHY HEAD AND NECK CT PERFUSION BRAIN TECHNIQUE: Multidetector CT imaging of the head and neck was performed using the standard protocol during bolus administration of intravenous contrast. Multiplanar CT image reconstructions and MIPs were obtained to evaluate the vascular anatomy. Carotid stenosis measurements (when applicable) are obtained utilizing NASCET  criteria, using the distal internal carotid diameter as the denominator. Multiphase CT imaging of the brain was performed following IV bolus contrast injection. Subsequent parametric perfusion maps were calculated using RAPID software. CONTRAST:  90 cc of Isovue 370. COMPARISON:  Comparison made with previous noncontrast head CT from earlier same day. FINDINGS: CTA NECK FINDINGS Aortic arch: Visualized aortic arch of normal caliber with normal 3 vessel morphology. Scattered atheromatous plaque within the arch itself and about the origin of the great vessels without flow-limiting stenosis. Visualized subclavian artery is patent without flow-limiting stenosis. Right carotid system: Scattered a centric plaque within the right common carotid artery without stenosis. No significant atheromatous narrowing about the right carotid bifurcation. Right ICA widely patent distally to the skullbase. Left carotid system: Scattered a centric plaque within the left common carotid artery without stenosis. No significant atheromatous narrowing  about the left carotid bifurcation. Left ICA patent distally to the skullbase without flow-limiting stenosis. Vertebral arteries: Both vertebral arteries arise from the subclavian arteries. Left vertebral artery dominant. Mild plaque at the origin of the vertebral artery's bilaterally without significant stenosis. Vertebral arteries otherwise widely patent within the neck. Skeleton: No acute osseus abnormality. No worrisome lytic or blastic osseous lesions. Advanced degenerative spondylolysis noted at C3-4 through C6-7. Other neck: No acute soft tissue abnormality within the neck. Salivary glands normal. No adenopathy. Subcentimeter hypodense left thyroid nodule noted, of doubtful significance. Thyroid otherwise unremarkable. Lower right internal jugular vein is patulous. Upper chest: Visualized upper chest demonstrates no acute abnormality. Partially visualized lungs are clear. Review of the MIP images confirms the above findings CTA HEAD FINDINGS Anterior circulation: Internal carotid artery's patent through the termini without flow-limiting stenosis. Scattered atheromatous plaque noted within the cavernous/supraclinoid ICAs bilaterally. ICA termini themselves are widely patent. A1 segments widely patent. Normal anterior communicating artery. Anterior cerebral arteries patent to their distal aspects without stenosis. Left M1 segment patent without stenosis or occlusion. Left MCA bifurcation normal. No proximal M2 occlusion. There is abrupt occlusion and/or near occlusion of distal left M2/M3 branches (series 13, image 129 on coronal sequence, series 12, image 88 on axial sequence). There is contrast opacification of left MCA branches distally, favored to be related to subocclusive clot, although occlusive clot with associated collateral flow could also be considered. Left MCA branches otherwise patent. Right M1 segment widely patent. No proximal right M2 stenosis or occlusion. Distal right MCA branches well  perfused. Posterior circulation: Dominant left vertebral artery widely patent to the vertebrobasilar junction. Hypoplastic right vertebral artery patent as well without stenosis. Patent posterior inferior cerebral arteries. Basilar artery widely patent to its distal aspect. Superior cerebral arteries patent bilaterally. PCAs both arise from the basilar artery and are widely patent to their distal aspects without stenosis. Venous sinuses: Patent. Anatomic variants: None significant. Delayed phase: Not performed. Review of the MIP images confirms the above findings CT Brain Perfusion Findings: CBF (<30%) Volume: 11mL Perfusion (Tmax>6.0s) volume: 34mL Mismatch Volume: 33mL Infarction Location:Confluent perfusion mismatch at the posterior left MCA distribution at the left frontoparietal region without core infarct. Finding consistent with subocclusive distal left M2/M3 branch identified on CTA. IMPRESSION: 1. Subocclusive distal left M2/M3 thrombus with associated delayed perfusion within the posterior left MCA territory without core infarct. As discussed above, thrombus is favored to be sub occlusive in nature, although collateral flow may be contributing to distal MCA branch opacification seen beyond the defect. 2. Otherwise negative CTA without large or proximal arterial branch occlusion. No other high-grade or correctable stenosis. 3.  Mild aortic arch, common carotid, carotid siphon calcifications without flow-limiting stenosis. Critical Value/emergent results were called by telephone at the time of interpretation on 12/01/2016 at 6:01 pm to Dr. Amie Portland , who verbally acknowledged these results. Electronically Signed   By: Jeannine Boga M.D.   On: 12/01/2016 18:32   Ct Cerebral Perfusion W Contrast  Result Date: 12/01/2016 CLINICAL DATA:  Initial evaluation for acute slurred speech, right-sided weakness, aphasia. EXAM: CT ANGIOGRAPHY HEAD AND NECK CT PERFUSION BRAIN TECHNIQUE: Multidetector CT imaging  of the head and neck was performed using the standard protocol during bolus administration of intravenous contrast. Multiplanar CT image reconstructions and MIPs were obtained to evaluate the vascular anatomy. Carotid stenosis measurements (when applicable) are obtained utilizing NASCET criteria, using the distal internal carotid diameter as the denominator. Multiphase CT imaging of the brain was performed following IV bolus contrast injection. Subsequent parametric perfusion maps were calculated using RAPID software. CONTRAST:  90 cc of Isovue 370. COMPARISON:  Comparison made with previous noncontrast head CT from earlier same day. FINDINGS: CTA NECK FINDINGS Aortic arch: Visualized aortic arch of normal caliber with normal 3 vessel morphology. Scattered atheromatous plaque within the arch itself and about the origin of the great vessels without flow-limiting stenosis. Visualized subclavian artery is patent without flow-limiting stenosis. Right carotid system: Scattered a centric plaque within the right common carotid artery without stenosis. No significant atheromatous narrowing about the right carotid bifurcation. Right ICA widely patent distally to the skullbase. Left carotid system: Scattered a centric plaque within the left common carotid artery without stenosis. No significant atheromatous narrowing about the left carotid bifurcation. Left ICA patent distally to the skullbase without flow-limiting stenosis. Vertebral arteries: Both vertebral arteries arise from the subclavian arteries. Left vertebral artery dominant. Mild plaque at the origin of the vertebral artery's bilaterally without significant stenosis. Vertebral arteries otherwise widely patent within the neck. Skeleton: No acute osseus abnormality. No worrisome lytic or blastic osseous lesions. Advanced degenerative spondylolysis noted at C3-4 through C6-7. Other neck: No acute soft tissue abnormality within the neck. Salivary glands normal. No  adenopathy. Subcentimeter hypodense left thyroid nodule noted, of doubtful significance. Thyroid otherwise unremarkable. Lower right internal jugular vein is patulous. Upper chest: Visualized upper chest demonstrates no acute abnormality. Partially visualized lungs are clear. Review of the MIP images confirms the above findings CTA HEAD FINDINGS Anterior circulation: Internal carotid artery's patent through the termini without flow-limiting stenosis. Scattered atheromatous plaque noted within the cavernous/supraclinoid ICAs bilaterally. ICA termini themselves are widely patent. A1 segments widely patent. Normal anterior communicating artery. Anterior cerebral arteries patent to their distal aspects without stenosis. Left M1 segment patent without stenosis or occlusion. Left MCA bifurcation normal. No proximal M2 occlusion. There is abrupt occlusion and/or near occlusion of distal left M2/M3 branches (series 13, image 129 on coronal sequence, series 12, image 88 on axial sequence). There is contrast opacification of left MCA branches distally, favored to be related to subocclusive clot, although occlusive clot with associated collateral flow could also be considered. Left MCA branches otherwise patent. Right M1 segment widely patent. No proximal right M2 stenosis or occlusion. Distal right MCA branches well perfused. Posterior circulation: Dominant left vertebral artery widely patent to the vertebrobasilar junction. Hypoplastic right vertebral artery patent as well without stenosis. Patent posterior inferior cerebral arteries. Basilar artery widely patent to its distal aspect. Superior cerebral arteries patent bilaterally. PCAs both arise from the basilar artery and are widely patent to their distal aspects without stenosis. Venous sinuses:  Patent. Anatomic variants: None significant. Delayed phase: Not performed. Review of the MIP images confirms the above findings CT Brain Perfusion Findings: CBF (<30%) Volume: 16mL  Perfusion (Tmax>6.0s) volume: 60mL Mismatch Volume: 31mL Infarction Location:Confluent perfusion mismatch at the posterior left MCA distribution at the left frontoparietal region without core infarct. Finding consistent with subocclusive distal left M2/M3 branch identified on CTA. IMPRESSION: 1. Subocclusive distal left M2/M3 thrombus with associated delayed perfusion within the posterior left MCA territory without core infarct. As discussed above, thrombus is favored to be sub occlusive in nature, although collateral flow may be contributing to distal MCA branch opacification seen beyond the defect. 2. Otherwise negative CTA without large or proximal arterial branch occlusion. No other high-grade or correctable stenosis. 3. Mild aortic arch, common carotid, carotid siphon calcifications without flow-limiting stenosis. Critical Value/emergent results were called by telephone at the time of interpretation on 12/01/2016 at 6:01 pm to Dr. Amie Portland , who verbally acknowledged these results. Electronically Signed   By: Jeannine Boga M.D.   On: 12/01/2016 18:32   Dg Chest Port 1 View  Result Date: 12/01/2016 CLINICAL DATA:  Stroke. EXAM: PORTABLE CHEST 1 VIEW COMPARISON:  05/11/2015 FINDINGS: Postoperative cardiac valve replacement with surgical clips in the mediastinum and right axilla. Heart size and pulmonary vascularity are normal. No airspace disease or consolidation in the lungs. No blunting of costophrenic angles. No pneumothorax. Calcification of the aorta. IMPRESSION: No evidence of active pulmonary disease.  Aortic atherosclerosis. Electronically Signed   By: Lucienne Capers M.D.   On: 12/01/2016 22:43   Ct Head Code Stroke Wo Contrast  Result Date: 12/01/2016 CLINICAL DATA:  Code stroke. 76 year old male with right side weakness and slurred speech. Last seen normal 1245 hours. EXAM: CT HEAD WITHOUT CONTRAST TECHNIQUE: Contiguous axial images were obtained from the base of the skull through  the vertex without intravenous contrast. COMPARISON:  None. FINDINGS: Brain: No midline shift, mass effect, or evidence of intracranial mass lesion. No ventriculomegaly. No acute intracranial hemorrhage identified. Small chronic appearing bilateral cerebellar infarcts. Occasional tiny chronic appearing bilateral caudate lacune CED. Elsewhere gray-white matter differentiation is within normal limits for age. No cortical encephalomalacia identified. No cortically based acute infarct identified. Vascular: Calcified atherosclerosis at the skull base. No suspicious intracranial vascular hyperdensity. Skull: No acute osseous abnormality identified. Sinuses/Orbits: Well pneumatized. Other: No acute orbit or scalp soft tissue findings. ASPECTS [facility] Stroke Program Early CT Score) - Ganglionic level infarction (caudate, lentiform nuclei, internal capsule, insula, M1-M3 cortex): 7 - Supraganglionic infarction (M4-M6 cortex): 3 Total score (0-10 with 10 being normal): 10 IMPRESSION: 1. No acute cortically based infarct or acute intracranial hemorrhage identified. 2. ASPECTS is 10. 3. 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 4. The above was relayed via text pager to Dr. Jerelyn Charles on 12/01/2016 at 17:33 . Electronically Signed   By: Genevie Ann M.D.   On: 12/01/2016 17:33    Procedures .Critical Care Performed by: Duffy Bruce Authorized by: Duffy Bruce   Critical care provider statement:    Critical care time (minutes):  35   Critical care time was exclusive of:  Separately billable procedures and treating other patients and teaching time   Critical care was necessary to treat or prevent imminent or life-threatening deterioration of the following conditions:  CNS failure or compromise   Critical care was time spent personally by me on the following activities:  Development of treatment plan with patient or surrogate, discussions with consultants, evaluation  of patient's  response to treatment, examination of patient, obtaining history from patient or surrogate, ordering and performing treatments and interventions, ordering and review of laboratory studies, ordering and review of radiographic studies, pulse oximetry, re-evaluation of patient's condition and review of old charts   I assumed direction of critical care for this patient from another provider in my specialty: no      (including critical care time)  Medications Ordered in ED Medications  iopamidol (ISOVUE-300) 61 % injection (not administered)  lidocaine (PF) (XYLOCAINE) 1 % injection (not administered)  nicardipine (CARDENE) 20mg  in 2.33% saline 21ml IV infusion (0.1 mg/ml) (2.5 mg/hr Intravenous Rate/Dose Change 12/01/16 2302)  hydrALAZINE (APRESOLINE) 20 MG/ML injection (not administered)   stroke: mapping our early stages of recovery book (not administered)  0.9 %  sodium chloride infusion ( Intravenous New Bag/Given 12/01/16 2131)  acetaminophen (TYLENOL) tablet 650 mg (not administered)    Or  acetaminophen (TYLENOL) solution 650 mg (not administered)    Or  acetaminophen (TYLENOL) suppository 650 mg (not administered)  senna-docusate (Senokot-S) tablet 1 tablet (not administered)  aspirin EC tablet 325 mg (not administered)  heparin ADULT infusion 100 units/mL (25000 units/221mL sodium chloride 0.45%) (not administered)  iopamidol (ISOVUE-370) 76 % injection (90 mLs  Contrast Given 12/01/16 1730)  nicardipine (CARDENE) 20mg  in 0.86% saline 216ml IV infusion (0.1 mg/ml) (0 mg/hr Intravenous Stopping Infusion hung by another clincian 12/01/16 2109)  iopamidol (ISOVUE-300) 61 % injection (50 mLs  Contrast Given 12/01/16 1955)  midazolam (VERSED) injection (0.5 mg Intravenous Given 12/01/16 1923)  fentaNYL (SUBLIMAZE) injection (25 mcg Intravenous Given 12/01/16 1928)     Initial Impression / Assessment and Plan / ED Course  I have reviewed the triage vital signs and the nursing  notes.  Pertinent labs & imaging results that were available during my care of the patient were reviewed by me and considered in my medical decision making (see chart for details).     76 year old male here with acute onset of the fascia, facial droop, and weakness. Code stroke activated on arrival. Imaging reviewed and is concerning for posterior M2 stroke, likely embolic in the setting of his known mitral valve. Dr. Rory Percy with Neurology at bedside immediately and throughout case. Given pt's excellent premorbid functional status, will be taken to IR for angio, possible intervention if lesion is amenable to it. INR 2.31. Cardizem gtt started for BP control in setting of acute ischemic stroke. Not candidate for tPA given his coumadin use. Pt, family updated and in agreement.   Final Clinical Impressions(s) / ED Diagnoses   Final diagnoses:  Acute embolic stroke [facility])  Acute ischemic stroke ([place])  Cerebral infarction [facility])  Stroke [facility])    New Prescriptions Current Discharge Medication List       Duffy Bruce, MD 12/01/16 2317

## 2016-12-01 NOTE — Progress Notes (Signed)
ANTICOAGULATION CONSULT NOTE - Initial Consult  Pharmacy Consult for Heparin Indication: mechanical MVR, CODE STROKE  Allergies  Allergen Reactions  . Penicillins Nausea And Vomiting    Has patient had a PCN reaction causing immediate rash, facial/tongue/throat swelling, SOB or lightheadedness with hypotension: No Has patient had a PCN reaction causing severe rash involving mucus membranes or skin necrosis: No Has patient had a PCN reaction that required hospitalization: Unknown Has patient had a PCN reaction occurring within the last 10 years: No If all of the above answers are "NO", then may proceed with Cephalosporin use.  . Vancomycin Other (See Comments)    Kidney problems    Patient Measurements: Weight: 129 lb 13.6 oz (58.9 kg) Heparin Dosing Weight: 58.9 kg  Vital Signs: Temp: 97.8 F (36.6 C) (09/21 1753) Temp Source: Oral (09/21 1753) BP: 138/75 (09/21 2005) Pulse Rate: 84 (09/21 2005)  Labs:  Recent Labs  12/01/16 1717 12/01/16 1722  HGB 12.5* 12.2*  HCT 37.4* 36.0*  PLT 174  --   APTT 37*  --   LABPROT 25.2*  --   INR 2.31  --   CREATININE 1.74* 1.70*    Estimated Creatinine Clearance: 30.3 mL/min (A) (by C-G formula based on SCr of 1.7 mg/dL (H)).   Medical History: Past Medical History:  Diagnosis Date  . BPH (benign prostatic hyperplasia)   . Chronic anticoagulation    coumadin for mechanical valves  . CKD (chronic kidney disease), stage III   . Diabetes mellitus type 2, diet-controlled ([place])   . Gastric cancer ([place]) 2005   s/p gastrectomy  . High cholesterol   . HOH (hard of hearing)   . Hypertension   . Rheumatic heart disease   . 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 aspirin EC 81 MG tablet Take 81 mg by mouth daily.   12/01/2016 at am  . atorvastatin (LIPITOR) 20 MG tablet Take 1 tablet (20 mg total) by  mouth at bedtime. 90 tablet 3 11/30/2016 at pm  . carvedilol (COREG) 6.25 MG tablet TAKE 1 TABLET BY MOUTH TWICE A DAY WITH A MEAL (Patient taking differently: Take 6.25 mg by mouth 2 (two) times daily with a meal. ) 180 tablet 3 12/01/2016 at 700  . chlorhexidine (PERIDEX) 0.12 % solution by Mouth Rinse route See admin instructions. Swish and spit small amount by mouth daily at bedtime   11/30/2016 at pm  . ferrous sulfate 325 (65 FE) MG tablet Take 1 tablet (325 mg total) by mouth 2 (two) times daily with a meal. 30 tablet 3 12/01/2016 at am  . furosemide (LASIX) 20 MG tablet Take 20 mg by mouth daily.   12/01/2016 at am  . isosorbide mononitrate (IMDUR) 30 MG 24 hr tablet Take 1 tablet (30 mg total) by mouth daily. 90 tablet 3 12/01/2016 at am  . Multiple Vitamin (MULTIVITAMIN WITH MINERALS) TABS tablet Take 1 tablet by mouth daily.   12/01/2016 at am  . tamsulosin (FLOMAX) 0.4 MG CAPS capsule TAKE 1 CAPSULE (0.4 MG TOTAL) BY MOUTH DAILY AFTER SUPPER. 30 capsule 10 11/30/2016 at pm  . Thiamine Mononitrate (VITAMIN B1 PO) Take 1 tablet by mouth daily.    12/01/2016 at am  . vitamin B-12 (CYANOCOBALAMIN) 1000 MCG tablet Take 1 tablet (1,000 mcg total) by mouth daily. 7 tablet 0 12/01/2016 at am  . warfarin (COUMADIN)  3 MG tablet Take 3mg  (1 tablet) every day except for 6mg  (2 tablets) every Thursday as directed by coumadin clinic. (Patient taking differently: Take 3-6 mg by mouth at bedtime. Take 2 tablets (6 mg) by mouth on Thursdays at bedtime, take 1 tablet (3 mg) on all other nights of the week or as directed by coumadin clinic.) 100 tablet 1 11/30/2016 at 2200  . furosemide (LASIX) 40 MG tablet Take 1 tablet (40 mg total) by mouth 2 (two) times daily. (Patient not taking: Reported on 12/01/2016) 180 tablet 1 Not Taking at Unknown time  . warfarin (COUMADIN) 3 MG tablet Take 2 tabletd (6mg ) every Thursday and 1 tablet (3mg ) all other days of the week or as directed by coumadin clinic. (Patient not taking:  Reported on 12/01/2016) 100 tablet 1 Not Taking at Unknown time    Assessment: 76 yo M on Coumadin PTA for mechanical MVR.  Pt developed R sided weakness, aphasia, and dysarthria earlier today.  Presented to ED as CODE STROKE.  Did not receive tPA due to elevated INR and out of time frame.  Pt did go to IR for bilateral angiograms and still has groin sheath in place.  Pharmacy has been consulted to start heparin (low goal 0.3-0.5) give hx mechanical valve.  Goal of Therapy:  Heparin level 0.3-0.5 units/ml Monitor platelets by anticoagulation protocol: Yes   Plan:  Heparin infusion at 700 units/hr.  No bolus.  Goal heparin level 0.3-0.5.  Presuming heparin will be held at some point to remove the groin sheath.  Please follow up timing with MD.  Heparin will need to be resumed post sheath pull to cover for mMVR.  Heparin level at 0800 Heparin level and CBC daily  [employer], Pharm.D., BCPS Clinical Pharmacist 12/01/2016 11:10 PM

## 2016-12-02 ENCOUNTER — Inpatient Hospital Stay (HOSPITAL_COMMUNITY): Payer: BC Managed Care – PPO

## 2016-12-02 DIAGNOSIS — I639 Cerebral infarction, unspecified: Secondary | ICD-10-CM

## 2016-12-02 DIAGNOSIS — I10 Essential (primary) hypertension: Secondary | ICD-10-CM

## 2016-12-02 DIAGNOSIS — Z7901 Long term (current) use of anticoagulants: Secondary | ICD-10-CM

## 2016-12-02 DIAGNOSIS — I6340 Cerebral infarction due to embolism of unspecified cerebral artery: Principal | ICD-10-CM

## 2016-12-02 DIAGNOSIS — Z952 Presence of prosthetic heart valve: Secondary | ICD-10-CM

## 2016-12-02 DIAGNOSIS — E785 Hyperlipidemia, unspecified: Secondary | ICD-10-CM

## 2016-12-02 DIAGNOSIS — I351 Nonrheumatic aortic (valve) insufficiency: Secondary | ICD-10-CM

## 2016-12-02 LAB — BASIC METABOLIC PANEL
ANION GAP: 5 (ref 5–15)
BUN: 16 mg/dL (ref 6–20)
CO2: 27 mmol/L (ref 22–32)
Calcium: 8.1 mg/dL — ABNORMAL LOW (ref 8.9–10.3)
Chloride: 110 mmol/L (ref 101–111)
Creatinine, Ser: 1.53 mg/dL — ABNORMAL HIGH (ref 0.61–1.24)
GFR calc Af Amer: 49 mL/min — ABNORMAL LOW (ref >60)
GFR, EST NON AFRICAN AMERICAN: 42 mL/min — AB (ref >60)
Glucose, Bld: 108 mg/dL — ABNORMAL HIGH (ref 65–99)
POTASSIUM: 3.7 mmol/L (ref 3.5–5.1)
SODIUM: 142 mmol/L (ref 135–145)

## 2016-12-02 LAB — ECHOCARDIOGRAM COMPLETE
Ao-asc: 30 cm
CHL CUP PV REG GRAD DIAS: 12 mmHg
CHL CUP REG VEL DIAS: 174 cm/s
CHL CUP TV REG PEAK VELOCITY: 370 cm/s
E decel time: 292 msec
FS: 20 % — AB (ref 28–44)
Height: 66 in
IV/PV OW: 1.25
LA diam end sys: 39 mm
LA vol A4C: 34.1 ml
LA vol: 38.7 mL
LADIAMINDEX: 2.45 cm/m2
LASIZE: 39 mm
LAVOLIN: 24.3 mL/m2
LDCA: 2.84 cm2
LVOTD: 19 mm
MV Dec: 292
MV M vel: 81.9
MV pk A vel: 104 m/s
MV pk E vel: 137 m/s
MVANNULUSVTI: 46.9 cm
MVPG: 8 mmHg
Mean grad: 3 mmHg
PW: 13.3 mm — AB (ref 0.6–1.1)
RV LATERAL S' VELOCITY: 12.2 cm/s
RV TAPSE: 20.4 mm
RV sys press: 58 mmHg
TR max vel: 370 cm/s
Weight: 1932.99 oz

## 2016-12-02 LAB — CBC
HCT: 37.5 % — ABNORMAL LOW (ref 39.0–52.0)
HEMOGLOBIN: 12.2 g/dL — AB (ref 13.0–17.0)
MCH: 30.1 pg (ref 26.0–34.0)
MCHC: 32.5 g/dL (ref 30.0–36.0)
MCV: 92.6 fL (ref 78.0–100.0)
PLATELETS: 170 10*3/uL (ref 150–400)
RBC: 4.05 MIL/uL — AB (ref 4.22–5.81)
RDW: 12.7 % (ref 11.5–15.5)
WBC: 8 10*3/uL (ref 4.0–10.5)

## 2016-12-02 LAB — URINALYSIS, ROUTINE W REFLEX MICROSCOPIC
BACTERIA UA: NONE SEEN
BILIRUBIN URINE: NEGATIVE
GLUCOSE, UA: NEGATIVE mg/dL
KETONES UR: NEGATIVE mg/dL
NITRITE: NEGATIVE
PH: 7 (ref 5.0–8.0)
Protein, ur: NEGATIVE mg/dL
Specific Gravity, Urine: 1.019 (ref 1.005–1.030)
Squamous Epithelial / LPF: NONE SEEN

## 2016-12-02 LAB — PROTIME-INR
INR: 2.18
Prothrombin Time: 24.1 seconds — ABNORMAL HIGH (ref 11.4–15.2)

## 2016-12-02 LAB — HEPARIN LEVEL (UNFRACTIONATED)
HEPARIN UNFRACTIONATED: 0.51 [IU]/mL (ref 0.30–0.70)
Heparin Unfractionated: 0.24 IU/mL — ABNORMAL LOW (ref 0.30–0.70)

## 2016-12-02 LAB — MRSA PCR SCREENING: MRSA by PCR: NEGATIVE

## 2016-12-02 LAB — LIPID PANEL
CHOLESTEROL: 125 mg/dL (ref 0–200)
HDL: 45 mg/dL (ref >40)
LDL Cholesterol: 75 mg/dL (ref 0–99)
TRIGLYCERIDES: 27 mg/dL (ref <150)
Total CHOL/HDL Ratio: 2.8 RATIO
VLDL: 5 mg/dL (ref 0–40)

## 2016-12-02 LAB — HEMOGLOBIN A1C
HEMOGLOBIN A1C: 6 % — AB (ref 4.8–5.6)
Mean Plasma Glucose: 125.5 mg/dL

## 2016-12-02 MED ORDER — VITAMIN B-1 100 MG PO TABS
100.0000 mg | ORAL_TABLET | Freq: Every day | ORAL | Status: DC
  Administered 2016-12-02 – 2016-12-05 (×4): 100 mg via ORAL
  Filled 2016-12-02 (×6): qty 1

## 2016-12-02 MED ORDER — INSULIN ASPART 100 UNIT/ML ~~LOC~~ SOLN: 0.0000 [IU] | Freq: Every day | SUBCUTANEOUS | Status: DC

## 2016-12-02 MED ORDER — TAMSULOSIN HCL 0.4 MG PO CAPS
0.4000 mg | ORAL_CAPSULE | Freq: Every day | ORAL | Status: DC
  Administered 2016-12-02 – 2016-12-05 (×4): 0.4 mg via ORAL
  Filled 2016-12-02 (×4): qty 1

## 2016-12-02 MED ORDER — ATORVASTATIN CALCIUM 40 MG PO TABS
40.0000 mg | ORAL_TABLET | Freq: Every day | ORAL | Status: DC
  Administered 2016-12-02 – 2016-12-05 (×4): 40 mg via ORAL
  Filled 2016-12-02 (×4): qty 1

## 2016-12-02 MED ORDER — CARVEDILOL 6.25 MG PO TABS
6.2500 mg | ORAL_TABLET | Freq: Two times a day (BID) | ORAL | Status: DC
  Administered 2016-12-02 – 2016-12-05 (×7): 6.25 mg via ORAL
  Filled 2016-12-02 (×2): qty 2
  Filled 2016-12-02: qty 1
  Filled 2016-12-02: qty 2
  Filled 2016-12-02: qty 1
  Filled 2016-12-02 (×2): qty 2

## 2016-12-02 MED ORDER — INSULIN ASPART 100 UNIT/ML ~~LOC~~ SOLN
0.0000 [IU] | Freq: Three times a day (TID) | SUBCUTANEOUS | Status: DC
Start: 2016-12-03 — End: 2016-12-05
  Administered 2016-12-03 – 2016-12-04 (×4): 1 [IU] via SUBCUTANEOUS
  Administered 2016-12-05: 3 [IU] via SUBCUTANEOUS
  Administered 2016-12-05: 1 [IU] via SUBCUTANEOUS

## 2016-12-02 MED ORDER — VITAMIN B-12 1000 MCG PO TABS
1000.0000 ug | ORAL_TABLET | Freq: Every day | ORAL | Status: DC
  Administered 2016-12-03 – 2016-12-05 (×4): 1000 ug via ORAL
  Filled 2016-12-02 (×4): qty 1

## 2016-12-02 MED ORDER — ISOSORBIDE MONONITRATE ER 30 MG PO TB24
30.0000 mg | ORAL_TABLET | Freq: Every day | ORAL | Status: DC
  Administered 2016-12-02 – 2016-12-05 (×4): 30 mg via ORAL
  Filled 2016-12-02 (×4): qty 1

## 2016-12-02 MED ORDER — ADULT MULTIVITAMIN W/MINERALS CH
1.0000 | ORAL_TABLET | Freq: Every day | ORAL | Status: DC
  Administered 2016-12-02 – 2016-12-05 (×4): 1 via ORAL
  Filled 2016-12-02 (×4): qty 1

## 2016-12-02 MED ORDER — FUROSEMIDE 20 MG PO TABS
20.0000 mg | ORAL_TABLET | Freq: Every day | ORAL | Status: DC
  Administered 2016-12-02 – 2016-12-05 (×4): 20 mg via ORAL
  Filled 2016-12-02 (×4): qty 1

## 2016-12-02 NOTE — Progress Notes (Signed)
ANTICOAGULATION CONSULT NOTE - Follow Up Consult  Pharmacy Consult for heparin Indication: MVR w/ acute CVA  Labs:  Recent Labs  12/01/16 1717 12/01/16 1722 12/02/16 0236 12/02/16 0814 12/02/16 2246  HGB 12.5* 12.2* 12.2*  --   --   HCT 37.4* 36.0* 37.5*  --   --   PLT 174  --  170  --   --   APTT 37*  --   --   --   --   LABPROT 25.2*  --  24.1*  --   --   INR 2.31  --  2.18  --   --   HEPARINUNFRC  --   --   --  0.24* 0.51  CREATININE 1.74* 1.70* 1.53*  --   --     Assessment: 76yo male now slightly above goal on heparin after rate increase; RN notes some oozing from sheath site, currently no scheduled time for sheath removal but will possibly be taken to IR 9/23.  Goal of Therapy:  Heparin level 0.3-0.5 units/ml   Plan:  Will decrease heparin gtt slightly to 750 units/hr and check level in [place], PharmD, BCPS  12/02/2016,11:25 PM

## 2016-12-02 NOTE — Progress Notes (Signed)
Patient ID: Robert [name], male   DOB: Oct 25, 1940, 76 y.o.   MRN: [id number]  IR Aware--- Rt groin sheath still in place after cerebral arteriogram last pm INR today 2.18  Will remove arterial sheath when INR is 1.5 or less

## 2016-12-02 NOTE — Progress Notes (Signed)
  Echocardiogram 2D Echocardiogram has been performed.  Savana Spina G Jahnay Lantier 12/02/2016, 2:05 PM

## 2016-12-02 NOTE — Progress Notes (Signed)
A-line pressures inaccurate r/t pt continuously moving extremity. BP based off of cuff pressure d/t inaccuracy.

## 2016-12-02 NOTE — Progress Notes (Signed)
PT Cancellation Note  Patient Details Name: BEULAH MATUSEK MRN: [id number] DOB: 09/04/1940   Cancelled Treatment:    Reason Eval/Treat Not Completed: Patient not medically ready (on bedrest). Will return once activity status has been updated.  Sindy Guadeloupe, SPT [phone] office    Margarita Grizzle 12/02/2016, 10:31 AM

## 2016-12-02 NOTE — Progress Notes (Signed)
STROKE TEAM PROGRESS NOTE   HISTORY OF PRESENT ILLNESS (per record) Triton D Vyasis a 76 y.o.malePMH of HTN HLD, CKD, mechanical mitral valve on Coumadin, last INR 4, Brought in as a code stroke for right-sided weakness, aphasia and dysarthria that started at 12:45 PM. 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 He went to his office. He spoke with his wife and brother. His wife picked him up from the office and to come to urgent care. At the urgent care he was evaluated and there was a concern for stroke and he was brought into the [facility] emergency room. He is on Coumadin, with his last INR at 4.0. He was not a candidate for TPA because he was outside the window by the time he came here and he was on Coumadin. For EMS, he had right arm and leg drift, right facial droop,some word finding difficulty and slurred speech. He was evaluated at the emergency room bridge and at the time had right facial droop, dysarthria and mild aphasia. He had no arm or leg weakness at the time. No sensory loss.   LKW: 1245 hrs. On 12/01/2016. tpa g ven?: no, on Coumadin therapeutic INR,and outside the window Premorbid modified Rankin scale (mRS):0   SUBJECTIVE (INTERVAL HISTORY) His wife is at the bedside.  I also talked to his brother over the phone. Patient symptoms much improved, still has intermittent paraphasic errors, mildly lethargic. Other than that, according to wife, patient seems back to his baseline. INR 2.18 today, on heparin IV, right femoral sheath still in place, not able to take out today due to high INR.   OBJECTIVE Temp:  [97.4 F (36.3 C)-97.8 F (36.6 C)] 97.4 F (36.3 C) (09/22 0300) Pulse Rate:  [58-96] 58 (09/22 0700) Cardiac Rhythm: Normal sinus rhythm (09/21 2045) Resp:  [10-26] 11 (09/22 0700) BP: (111-239)/(58-135) 164/68 (09/22 0700) SpO2:  [94 %-100 %] 99 %  (09/22 0700) Arterial Line BP: (137-207)/(51-86) 184/70 (09/22 0730) Weight:  [120 lb 13 oz (54.8 kg)-129 lb 13.6 oz (58.9 kg)] 120 lb 13 oz (54.8 kg) (09/21 2045)  CBC:   Recent Labs Lab 12/01/16 1717 12/01/16 1722 12/02/16 0236  WBC 6.0  --  8.0  NEUTROABS 3.6  --   --   HGB 12.5* 12.2* 12.2*  HCT 37.4* 36.0* 37.5*  MCV 93.5  --  92.6  PLT 174  --  956    Basic Metabolic Panel:   Recent Labs Lab 12/01/16 1717 12/01/16 1722 12/02/16 0236  NA 139 142 142  K 4.1 4.0 3.7  CL 108 104 110  CO2 28  --  27  GLUCOSE 178* 174* 108*  BUN 17 21* 16  CREATININE 1.74* 1.70* 1.53*  CALCIUM 8.4*  --  8.1*    Lipid Panel:     Component Value Date/Time   CHOL 125 12/02/2016 0236   TRIG 27 12/02/2016 0236   HDL 45 12/02/2016 0236   CHOLHDL 2.8 12/02/2016 0236   VLDL 5 12/02/2016 0236   LDLCALC 75 12/02/2016 0236   HgbA1c:  Lab Results  Component Value Date   HGBA1C 6.0 (H) 12/02/2016   Urine Drug Screen: No results found for: LABOPIA, COCAINSCRNUR, LABBENZ, AMPHETMU, THCU, LABBARB  Alcohol Level No results found for: [place] I have personally reviewed the radiological images below and agree with the radiology interpretations.  MRI / MRA  Brain  12/02/2016 1. Patchy acute infarct in the posterior left insula/operculum corresponding to the left MCA M2 lesion seen yesterday. That lesion does not persist on today intracranial MRA, although the axial FLAIR does suggest some residual slow arterial flow in that region. 2. Additional small scattered punctate acute infarcts elsewhere in the left MCA territory, but also in the right MCA and right PCA territory. Therefore, consider a recent embolic event from the heart or proximal aorta. 3. No associated hemorrhage or mass effect. 4. Superimposed chronic bilateral cerebellar artery ischemia. 5. No intracranial stenosis or circle of Willis branch occlusion on today's intracranial MRA.  Ct Angio Head W Or Wo Contrast Ct  Angio Neck W Or Wo Contrast Ct Cerebral Perfusion W Contrast 12/01/2016 IMPRESSION:  1. Subocclusive distal left M2/M3 thrombus with associated delayed perfusion within the posterior left MCA territory without core infarct. As discussed above, thrombus is favored to be sub occlusive in nature, although collateral flow may be contributing to distal MCA branch opacification seen beyond the defect.  2. Otherwise negative CTA without large or proximal arterial branch occlusion. No other high-grade or correctable stenosis.  3. Mild aortic arch, common carotid, carotid siphon calcifications without flow-limiting stenosis.   Ct Head Code Stroke Wo Contrast 12/01/2016 IMPRESSION:  1. No acute cortically based infarct or acute intracranial hemorrhage identified.  2. ASPECTS is 10.  3. 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   IR - Cerebral Angiogram 12/01/2016 Occluded distal parietal  M3 branch of inferior division  of Lt MCA.   Transthoracic Echocardiogram - Left ventricle: The cavity size was normal. There was moderate   concentric hypertrophy with severe hypertrophy of the mid   inferior myocardium (2.3cm). Systolic function was mildly to   moderately reduced. The estimated ejection fraction was in the   range of 40% to 45%. Wall motion was normal; there were no   regional wall motion abnormalities. The study is not technically   sufficient to allow evaluation of LV diastolic function. - Aortic valve: Transvalvular velocity was within the normal range.   There was no stenosis. There was mild regurgitation. - Mitral valve: A bileaflet mechanical prosthesis was present and   functioning normally. Transvalvular velocity was within the   normal range. There was no evidence for stenosis. There was no   regurgitation. - Right ventricle: The cavity size was normal. Wall thickness was   normal. Systolic function was normal. - Atrial septum: No defect or patent  foramen ovale was identified. - Tricuspid valve: There was moderate regurgitation. - Pulmonic valve: There was moderate regurgitation. - Pulmonary arteries: Systolic pressure was moderately to severely   increased. PA peak pressure: 58 mm Hg (S).   PHYSICAL EXAM Vitals:   12/02/16 0530 12/02/16 0600 12/02/16 0630 12/02/16 0700  BP: (!) 159/70 (!) 149/71 (!) 158/73 (!) 164/68  Pulse: 65 (!) 58 (!) 59 (!) 58  Resp: (!) 22 20 14 11   Temp:      TempSrc:      SpO2: 98% 97% 98% 99%  Weight:      Height:        Temp:  [97.4 F (36.3 C)-98.7 F (37.1 C)] 98.7 F (37.1 C) (09/22 1600) Pulse Rate:  [46-96] 74 (09/22 1500) Resp:  [10-26] 14 (09/22 1500) BP: (111-239)/(58-135) 145/63 (09/22 1500) SpO2:  [90 %-100 %] 100 % (09/22 1500) Arterial Line BP: (137-207)/(51-86) 170/59 (09/22 1500) Weight:  [120 lb 13 oz (54.8 kg)-129 lb 13.6  oz (58.9 kg)] 120 lb 13 oz (54.8 kg) (09/21 2045)  General - Well nourished, well developed, mildly lethargic.  Ophthalmologic - Fundi not visualized due to noncooperation.  Cardiovascular - Regular rate and rhythm.  Mental Status -  Level of arousal and orientation to time, place, and person were intact. Language including expression, naming, repetition, comprehension was assessed and found intact except mild dysarthria and occasional paraphasic errors  Cranial Nerves II - XII - II - Visual field intact OU. III, IV, VI - Extraocular movements intact. V - Facial sensation intact bilaterally. VII - Facial movement intact bilaterally. VIII - Hearing & vestibular intact bilaterally. X - Palate elevates symmetrically. XI - Chin turning & shoulder shrug intact bilaterally. XII - Tongue protrusion intact.  Motor Strength - The patient's strength was normal in all extremities except right proximal lower extremity muscle strength not able to test due to femoral sheath in place and pronator drift was absent.  Bulk was normal and fasciculations were absent.    Motor Tone - Muscle tone was assessed at the neck and appendages and was normal.  Reflexes - The patient's reflexes were 1+ in all extremities and he had no pathological reflexes.  Sensory - Light touch, temperature/pinprick were assessed and were symmetrical.    Coordination - The patient had normal movements in the hands with no ataxia or dysmetria.  Tremor was absent.  Gait and Station - deferred   ASSESSMENT/PLAN Mr. Robert [name] is a 76 y.o. male with history of rheumatic heart disease with mitral valve replacement (on Coumadin), hypertension, hyperlipidemia, diabetes mellitus, chronic kidney disease, presenting with right-sided weakness and speech difficulties. He did not receive IV t-PA due to anticoagulation and late presentation.  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  Resultant  aphasia much improved  CT head - No acute cortically based infarct or acute intracranial hemorrhage identified.   CTA H&N - Subocclusive distal left M2/M3 thrombus with associated delayed perfusion within the posterior left MCA territory without core infarct.  Cerebral angiogram - Occluded distal parietal M3 branch of inferior division  of Lt MCA  MRI head - multifocal small patchy were punctate infarcts including Left MCA, right MCA/PCA, right MCA/ACA territory infarcts  MRA head - recanalized left M2/M3 occlusion  2D Echo - EF 40-45%, mechanical MV functioning normally  LDL - 75  HgbA1c - 6.0  VTE prophylaxis - IV heparin Diet NPO time specified  aspirin 81 mg daily and warfarin daily prior to admission, now on aspirin 325 mg daily and heparin IV. Transition to Coumadin once femoral sheath out. INR goal 2.5-3.5  Patient counseled to be compliant with his antithrombotic medications  Ongoing aggressive stroke risk factor  management  Therapy recommendations: - pending  Disposition: Pending  Mechanical MV  On Coumadin at home  INR 4.1 on 11/14/2016  Received recently antibiotics for infection  Admission INR 2.31  Continue hold off Coumadin until femoral sheath removed  Continue heparin IV, transition to Coumadin once femoral sheath out  Hypertension  Stable on Cardene  Permissive hypertension (OK if < 180/105) but gradually normalize in 5-7 days  Long-term BP goal normotensive  Hyperlipidemia  Home meds: Lipitor 20 mg daily   LDL 75 , goal < 70  Add Lipitor 40 mg daily  Continue statin at discharge  Diabetes  HgbA1c 6.0, goal < 7.0  Controlled  SSI  Other Stroke Risk Factors  Advanced age  Other Active Problems  Chronic kidney disease stage III Cre 1.74 - 1.7 - 1.53  Gastric cancer s/p gastrectomy  Hospital day # 1  This patient is critically ill due to cardioembolic stroke, mechanical MV, anticoagulation and cardiomyopathy at significant risk of neurological worsening, death form recurrent stroke, hemorrhagic conversion, bleeding, heart failure. This patient's care requires constant monitoring of vital signs, hemodynamics, respiratory and cardiac monitoring, review of multiple databases, neurological assessment, discussion with family, other specialists and medical decision making of high complexity. I spent 40 minutes of neurocritical care time in the care of this patient.  Rosalin Hawking, MD PhD Stroke Neurology 12/02/2016 5:49 PM  To contact Stroke Continuity provider, please refer to [email or web address] After hours, contact General Neurology

## 2016-12-02 NOTE — Progress Notes (Signed)
[place] for Heparin Indication: mechanical MVR, CODE STROKE  Allergies  Allergen Reactions  . Penicillins Nausea And Vomiting    Has patient had a PCN reaction causing immediate rash, facial/tongue/throat swelling, SOB or lightheadedness with hypotension: No Has patient had a PCN reaction causing severe rash involving mucus membranes or skin necrosis: No Has patient had a PCN reaction that required hospitalization: Unknown Has patient had a PCN reaction occurring within the last 10 years: No If all of the above answers are "NO", then may proceed with Cephalosporin use.  . Vancomycin Other (See Comments)    Kidney problems    Patient Measurements: Height: 5\' 6"  (167.6 cm) Weight: 120 lb 13 oz (54.8 kg) IBW/kg (Calculated) : 63.8 Heparin Dosing Weight: 55 kg  Vital Signs: Temp: 98 F (36.7 C) (09/22 1150) Temp Source: Oral (09/22 1150) BP: 192/80 (09/22 1240) Pulse Rate: 46 (09/22 1240)  Labs:  Recent Labs  12/01/16 1717 12/01/16 1722 12/02/16 0236 12/02/16 0814  HGB 12.5* 12.2* 12.2*  --   HCT 37.4* 36.0* 37.5*  --   PLT 174  --  170  --   APTT 37*  --   --   --   LABPROT 25.2*  --  24.1*  --   INR 2.31  --  2.18  --   HEPARINUNFRC  --   --   --  0.24*  CREATININE 1.74* 1.70* 1.53*  --     Medical History: Past Medical History:  Diagnosis Date  . BPH (benign prostatic hyperplasia)   . Chronic anticoagulation    coumadin for mechanical valves  . CKD (chronic kidney disease), stage III   . Diabetes mellitus type 2, diet-controlled ([place])   . Gastric cancer ([place]) 2005   s/p gastrectomy  . High cholesterol   . HOH (hard of hearing)   . Hypertension   . Rheumatic heart disease   . S/P MVR (mitral valve replacement) 2015   40mm StJude mechanical valve   Assessment: 76 yo male on warfarin PTA for mechanical MVR.  Pt developed R sided weakness, aphasia, and dysarthria earlier today.  Presented to ED as CODE STROKE.  Did  not receive tPA due to elevated INR and out of time frame.  Pt did go to IR for bilateral angiograms and still has groin sheath in place.  Pharmacy has been consulted to start heparin (low goal 0.3-0.5) given hx mechanical valve. INR currently 2.1. Heparin level was slightly subtherapeutic this morning.  Pt is on warfarin prior to admission: 6 mg qThurs, 3 mg on all other days  Goal of Therapy:  Heparin level 0.3-0.5 units/ml Monitor platelets by anticoagulation protocol: Yes   Plan:  Increase heparin to 800 units/hr Daily HL, CBC F/u plans for sheath removal    Hughes Better, PharmD, BCPS Clinical Pharmacist 12/02/2016 1:21 PM

## 2016-12-03 DIAGNOSIS — I63412 Cerebral infarction due to embolism of left middle cerebral artery: Secondary | ICD-10-CM

## 2016-12-03 LAB — CBC
HCT: 39.8 % (ref 39.0–52.0)
Hemoglobin: 13 g/dL (ref 13.0–17.0)
MCH: 30.3 pg (ref 26.0–34.0)
MCHC: 32.7 g/dL (ref 30.0–36.0)
MCV: 92.8 fL (ref 78.0–100.0)
PLATELETS: 170 10*3/uL (ref 150–400)
RBC: 4.29 MIL/uL (ref 4.22–5.81)
RDW: 12.8 % (ref 11.5–15.5)
WBC: 8.1 10*3/uL (ref 4.0–10.5)

## 2016-12-03 LAB — BASIC METABOLIC PANEL
ANION GAP: 5 (ref 5–15)
BUN: 12 mg/dL (ref 6–20)
CALCIUM: 8.2 mg/dL — AB (ref 8.9–10.3)
CO2: 26 mmol/L (ref 22–32)
Chloride: 110 mmol/L (ref 101–111)
Creatinine, Ser: 1.4 mg/dL — ABNORMAL HIGH (ref 0.61–1.24)
GFR calc Af Amer: 54 mL/min — ABNORMAL LOW (ref >60)
GFR calc non Af Amer: 47 mL/min — ABNORMAL LOW (ref >60)
GLUCOSE: 110 mg/dL — AB (ref 65–99)
Potassium: 3.8 mmol/L (ref 3.5–5.1)
Sodium: 141 mmol/L (ref 135–145)

## 2016-12-03 LAB — PROTIME-INR
INR: 1.74
INR: 1.63
PROTHROMBIN TIME: 20.2 s — AB (ref 11.4–15.2)
Prothrombin Time: 19.2 seconds — ABNORMAL HIGH (ref 11.4–15.2)

## 2016-12-03 LAB — GLUCOSE, CAPILLARY
GLUCOSE-CAPILLARY: 145 mg/dL — AB (ref 65–99)
Glucose-Capillary: 127 mg/dL — ABNORMAL HIGH (ref 65–99)
Glucose-Capillary: 107 mg/dL — ABNORMAL HIGH (ref 65–99)

## 2016-12-03 LAB — HEPARIN LEVEL (UNFRACTIONATED)
HEPARIN UNFRACTIONATED: 0.49 [IU]/mL (ref 0.30–0.70)
Heparin Unfractionated: 0.57 IU/mL (ref 0.30–0.70)

## 2016-12-03 MED ORDER — DEXTROSE 5 % IV SOLN
5.0000 mg | INTRAVENOUS | Status: AC
  Administered 2016-12-03: 5 mg via INTRAVENOUS
  Filled 2016-12-03: qty 0.5

## 2016-12-03 MED ORDER — VITAMIN K1 10 MG/ML IJ SOLN: 10.0000 mg | Freq: Once | INTRAVENOUS | Status: DC

## 2016-12-03 MED ORDER — VITAMIN K1 10 MG/ML IJ SOLN
5.0000 mg | INTRAVENOUS | Status: AC
  Administered 2016-12-03: 5 mg via INTRAVENOUS
  Filled 2016-12-03: qty 0.5

## 2016-12-03 MED ORDER — VITAMIN K1 10 MG/ML IJ SOLN: 10.0000 mg | INTRAMUSCULAR | Status: DC

## 2016-12-03 MED ORDER — AMLODIPINE BESYLATE 10 MG PO TABS
10.0000 mg | ORAL_TABLET | Freq: Every day | ORAL | Status: DC
  Administered 2016-12-03 – 2016-12-05 (×3): 10 mg via ORAL
  Filled 2016-12-03 (×3): qty 1

## 2016-12-03 NOTE — Evaluation (Signed)
Speech Language Pathology Evaluation Patient Details Name: SAVIO ALBRECHT MRN: [id number] DOB: 11-12-1940 Today's Date: 12/03/2016 Time: 1010-1030 SLP Time Calculation (min) (ACUTE ONLY): 20 min  Problem List:  Patient Active Problem List   Diagnosis Date Noted  . Cerebral embolism with cerebral infarction 12/01/2016  . Acute ischemic stroke ([place]) 12/01/2016  . Acute systolic congestive heart failure, NYHA class 3 ([place]) 04/18/2016  . DOE (dyspnea on exertion) 04/07/2016  . Abnormal EKG 10/18/2015  . AKI (acute kidney injury) ([place])   . BPH (benign prostatic hyperplasia) 08/29/2015  . Elevated troponin 08/29/2015  . SBO (small bowel obstruction) ([place]) 08/29/2015  . Pancreatitis 08/29/2015  . Protein-calorie malnutrition, severe ([place]) 08/29/2015  . HLD (hyperlipidemia) 08/29/2015  . Noncompliance with medications 05/26/2015  . Long term current use of anticoagulant therapy 05/18/2015  . Coronary artery disease involving native coronary artery of native heart without angina pectoris   . Chronic systolic (congestive) heart failure ([place]) 05/06/2015  . Hypertensive urgency 05/06/2015  . CKD (chronic kidney disease) stage 3, GFR 30-59 ml/min 05/06/2015  . Chronic anemia 05/06/2015  . H/O mitral valve replacement with mechanical valve 05/06/2015  . CHF (congestive heart failure) ([place]) 05/06/2015   Past Medical History:  Past Medical History:  Diagnosis Date  . BPH (benign prostatic hyperplasia)   . Chronic anticoagulation    coumadin for mechanical valves  . CKD (chronic kidney disease), stage III   . Diabetes mellitus type 2, diet-controlled ([place])   . Gastric cancer ([place]) 2005   s/p gastrectomy  . High cholesterol   . HOH (hard of hearing)   . Hypertension   . Rheumatic heart disease   . S/P MVR (mitral valve replacement) 2015   25mm StJude mechanical valve   Past Surgical History:  Past Surgical History:  Procedure Laterality Date  . CARDIAC CATHETERIZATION N/A  05/10/2015   Procedure: Right/Left Heart Cath and Coronary Angiography;  Surgeon: Burnell Blanks, MD;  Location: [facility];  Service: Cardiovascular;  Laterality: N/A;  . HERNIA REPAIR    . MITRAL VALVE REPLACEMENT  10/24/2013   72mm StJude mechanical valve  . PARTIAL GASTRECTOMY  2005   HPI:  Kingson Lohmeyer Vyasis a 76 y.o.malePMH of HTN HLD, CKD, mechanical mitral valve on Coumadin, last INR 4, Brought in as a code stroke for right-sided weakness, aphasia and dysarthria that started at 12:45 PM.   Assessment / Plan / Recommendation Clinical Impression  Language and motor speech screen were completed.  The patient was noted to be aphasic and dysarthric initially upon admission.  His wife was present at bedside.  Oral motor exam was completed and unremarkable.  All structures and function were adequate.  The patient reported that all symptoms have resolved.  Wife is in agreement.  They both reported that the quality of his speech is back to baseline and that dysarthria is no longer present.  At times he was difficult to understand due to low vocal volume which both wife and patient reported as baseline.  Obvious language deficits were not noted.  Patient and wife reported initial deficits have resolved.  He was able to answer biographical yes/no questions, identify pictures in a field of 10, follow 1-2 step directions, read/comprehend short sentences, repeat words and short phrases, name pictures, answer "wh" questions, name functions, perform sentence completion and write a short sentence.  Given results of screen acute ST needs are not identified.  The patient was encouraged to follow up with his primary care MD  if he had any issues returning to his usual duties that seemed to be related to aphasia or dysarthria.  ST to sign off at this time.  Please reconsult if we can be of further assistance.      SLP Assessment  SLP Recommendation/Assessment: Patient does not need any further  Speech [facility] Pathology Services    Follow Up Recommendations  None          SLP Evaluation Cognition  Orientation Level: Oriented X4       Comprehension  Auditory Comprehension Overall Auditory Comprehension: Appears within functional limits for tasks assessed Yes/No Questions: Impaired Basic Immediate Environment Questions: 50-74% accurate Commands: Impaired Complex Commands: 50-74% accurate Conversation: Complex Reading Comprehension Reading Status: Within funtional limits    Expression Expression Primary Mode of Expression: Verbal Verbal Expression Overall Verbal Expression: Appears within functional limits for tasks assessed Initiation: No impairment Automatic Speech: Name;Social Response Level of Generative/Spontaneous Verbalization: Conversation Repetition: No impairment Naming: No impairment Pragmatics: No impairment Non-Verbal Means of Communication: Not applicable Written Expression Dominant Hand: Right Written Expression: Within Functional Limits   Oral / Motor  Oral Motor/Sensory Function Overall Oral Motor/Sensory Function: Within functional limits Motor Speech Overall Motor Speech: Appears within functional limits for tasks assessed Respiration: Within functional limits Phonation: Normal Resonance: Within functional limits Articulation: Within functional limitis Intelligibility: Intelligible Motor Planning: Witnin functional limits Motor Speech Errors: Not applicable   GO                   Shelly Flatten, MA, CCC-SLP Acute Rehab SLP [phone] Lamar Sprinkles 12/03/2016, 10:47 AM

## 2016-12-03 NOTE — Progress Notes (Signed)
PT Cancellation Note  Patient Details Name: Robert [name] MRN: [id number] DOB: 01-15-1941   Cancelled Treatment:    Reason Eval/Treat Not Completed: Patient not medically ready   Duncan Dull 12/03/2016, 9:41 AM Alben Deeds, PT DPT  Board Certified Neurologic Specialist [phone]

## 2016-12-03 NOTE — Plan of Care (Signed)
Pt still woozzing from right groin sheath. Had Vitk 5mg  IV. Now INR 1.63. Discussed with Dr. Estanislado Pandy, he recommend to lower the BP to 120-140. Will restart cardene drip and keep BP < 140, add amlodipine and will give another dose of VitK. Follow up INR in am.   Rosalin Hawking, MD PhD Stroke Neurology 12/03/2016 5:33 PM

## 2016-12-03 NOTE — Progress Notes (Signed)
RN notified MD about patient bleeding from groin. RN will continue to monitor.

## 2016-12-03 NOTE — Progress Notes (Signed)
[place] for Heparin Indication: mechanical MVR, CODE STROKE  Allergies  Allergen Reactions  . Penicillins Nausea And Vomiting    Has patient had a PCN reaction causing immediate rash, facial/tongue/throat swelling, SOB or lightheadedness with hypotension: No Has patient had a PCN reaction causing severe rash involving mucus membranes or skin necrosis: No Has patient had a PCN reaction that required hospitalization: Unknown Has patient had a PCN reaction occurring within the last 10 years: No If all of the above answers are "NO", then may proceed with Cephalosporin use.  . Vancomycin Other (See Comments)    Kidney problems    Patient Measurements: Height: 5\' 6"  (167.6 cm) Weight: 120 lb 13 oz (54.8 kg) IBW/kg (Calculated) : 63.8 Heparin Dosing Weight: 55 kg  Vital Signs: Temp: 98.2 F (36.8 C) (09/23 1539) Temp Source: Oral (09/23 1539) BP: 114/66 (09/23 1930) Pulse Rate: 62 (09/23 1930)  Labs:  Recent Labs  12/01/16 1717 12/01/16 1722 12/02/16 0236  12/02/16 2246 12/03/16 0734 12/03/16 1513 12/03/16 1905  HGB 12.5* 12.2* 12.2*  --   --  13.0  --   --   HCT 37.4* 36.0* 37.5*  --   --  39.8  --   --   PLT 174  --  170  --   --  170  --   --   APTT 37*  --   --   --   --   --   --   --   LABPROT 25.2*  --  24.1*  --   --  20.2* 19.2*  --   INR 2.31  --  2.18  --   --  1.74 1.63  --   HEPARINUNFRC  --   --   --   < > 0.51 0.57  --  0.49  CREATININE 1.74* 1.70* 1.53*  --   --  1.40*  --   --   < > = values in this interval not displayed.  Medical History: Past Medical History:  Diagnosis Date  . BPH (benign prostatic hyperplasia)   . Chronic anticoagulation    coumadin for mechanical valves  . CKD (chronic kidney disease), stage III   . Diabetes mellitus type 2, diet-controlled ([place])   . Gastric cancer ([place]) 2005   s/p gastrectomy  . High cholesterol   . HOH (hard of hearing)   . Hypertension   . Rheumatic heart disease    . S/P MVR (mitral valve replacement) 2015   93mm StJude mechanical valve   Assessment: 76 yo male on warfarin PTA for mechanical MVR.  Pt developed R sided weakness, aphasia, and dysarthria earlier today.  Presented to ED as CODE STROKE.  Did not receive tPA due to elevated INR and out of time frame.  Pt did go to IR for bilateral angiograms and still has groin sheath in place. Planning to pull sheath when INR < 1.5, pt received Vitamin K 5 mg po x1 today.  Heparin level is therapeutic at 0.49 after most recent rate adjustment to 700 units/hr. Continues to have some oozing from right groin sheath. Pt was given 5 mg vit K IV this evening in addition to 5 mg PO this am.   Pt is on warfarin prior to admission: 6 mg qThurs, 3 mg on all other days  Goal of Therapy:  Heparin level 0.3-0.5 units/ml Monitor platelets by anticoagulation protocol: Yes   Plan:  1. Continue heparin at 700 units/hr 2. Confirmatory heparin  level with am labs  3. Daily heparin level , CBC 4. F/u plans for sheath removal   Vincenza Hews, PharmD, BCPS 12/03/2016, 8:31 PM

## 2016-12-03 NOTE — Progress Notes (Signed)
[place] for Heparin Indication: mechanical MVR, CODE STROKE  Allergies  Allergen Reactions  . Penicillins Nausea And Vomiting    Has patient had a PCN reaction causing immediate rash, facial/tongue/throat swelling, SOB or lightheadedness with hypotension: No Has patient had a PCN reaction causing severe rash involving mucus membranes or skin necrosis: No Has patient had a PCN reaction that required hospitalization: Unknown Has patient had a PCN reaction occurring within the last 10 years: No If all of the above answers are "NO", then may proceed with Cephalosporin use.  . Vancomycin Other (See Comments)    Kidney problems    Patient Measurements: Height: 5\' 6"  (167.6 cm) Weight: 120 lb 13 oz (54.8 kg) IBW/kg (Calculated) : 63.8 Heparin Dosing Weight: 55 kg  Vital Signs: Temp: 98.2 F (36.8 C) (09/23 0800) Temp Source: Oral (09/23 0800) BP: 173/83 (09/23 0830) Pulse Rate: 63 (09/23 0830)  Labs:  Recent Labs  12/01/16 1717 12/01/16 1722 12/02/16 0236 12/02/16 0814 12/02/16 2246 12/03/16 0734  HGB 12.5* 12.2* 12.2*  --   --  13.0  HCT 37.4* 36.0* 37.5*  --   --  39.8  PLT 174  --  170  --   --  170  APTT 37*  --   --   --   --   --   LABPROT 25.2*  --  24.1*  --   --  20.2*  INR 2.31  --  2.18  --   --  1.74  HEPARINUNFRC  --   --   --  0.24* 0.51 0.57  CREATININE 1.74* 1.70* 1.53*  --   --  1.40*    Medical History: Past Medical History:  Diagnosis Date  . BPH (benign prostatic hyperplasia)   . Chronic anticoagulation    coumadin for mechanical valves  . CKD (chronic kidney disease), stage III   . Diabetes mellitus type 2, diet-controlled ([place])   . Gastric cancer ([place]) 2005   s/p gastrectomy  . High cholesterol   . HOH (hard of hearing)   . Hypertension   . Rheumatic heart disease   . S/P MVR (mitral valve replacement) 2015   41mm StJude mechanical valve   Assessment: 76 yo male on warfarin PTA for mechanical  MVR.  Pt developed R sided weakness, aphasia, and dysarthria earlier today.  Presented to ED as CODE STROKE.  Did not receive tPA due to elevated INR and out of time frame.  Pt did go to IR for bilateral angiograms and still has groin sheath in place. Planning to pull sheath when INR < 1.5, pt received Vitamin K 5 mg po x1 today.  Pharmacy has been consulted to start heparin (low goal 0.3-0.5) given hx mechanical valve. INR down to 1.7. Heparin level was slightly supratherapeutic this morning, some oozing around sheath.  Pt is on warfarin prior to admission: 6 mg qThurs, 3 mg on all other days  Goal of Therapy:  Heparin level 0.3-0.5 units/ml Monitor platelets by anticoagulation protocol: Yes   Plan:  Reduce heparin to 700 units/hr Daily HL, CBC Check HL this afternoon  F/u plans for sheath removal    Hughes Better, PharmD, BCPS Clinical Pharmacist 12/03/2016 10:52 AM

## 2016-12-03 NOTE — Progress Notes (Signed)
STROKE TEAM PROGRESS NOTE   SUBJECTIVE (INTERVAL HISTORY) His wife is at the bedside.  I also talked to his brother in law over the phone. INR 1.74 today, on heparin IV, right femoral sheath still in place, wife complains some woozing blood from the right groin over night and now on sand bag. MRI showed left MCA small patchy infarcts and MRA showed right M2/M3 recanalized.    OBJECTIVE Temp:  [97.4 F (36.3 C)-98.7 F (37.1 C)] 97.4 F (36.3 C) (09/23 0400) Pulse Rate:  [46-80] 51 (09/23 0500) Cardiac Rhythm: Normal sinus rhythm (09/22 2000) Resp:  [9-24] 10 (09/23 0500) BP: (105-192)/(56-116) 146/71 (09/23 0500) SpO2:  [90 %-100 %] 100 % (09/23 0500) Arterial Line BP: (141-189)/(50-73) 168/66 (09/23 0500)  CBC:   Recent Labs Lab 12/01/16 1717 12/01/16 1722 12/02/16 0236  WBC 6.0  --  8.0  NEUTROABS 3.6  --   --   HGB 12.5* 12.2* 12.2*  HCT 37.4* 36.0* 37.5*  MCV 93.5  --  92.6  PLT 174  --  235    Basic Metabolic Panel:   Recent Labs Lab 12/01/16 1717 12/01/16 1722 12/02/16 0236  NA 139 142 142  K 4.1 4.0 3.7  CL 108 104 110  CO2 28  --  27  GLUCOSE 178* 174* 108*  BUN 17 21* 16  CREATININE 1.74* 1.70* 1.53*  CALCIUM 8.4*  --  8.1*    Lipid Panel:     Component Value Date/Time   CHOL 125 12/02/2016 0236   TRIG 27 12/02/2016 0236   HDL 45 12/02/2016 0236   CHOLHDL 2.8 12/02/2016 0236   VLDL 5 12/02/2016 0236   LDLCALC 75 12/02/2016 0236   HgbA1c:  Lab Results  Component Value Date   HGBA1C 6.0 (H) 12/02/2016   Urine Drug Screen: No results found for: LABOPIA, COCAINSCRNUR, LABBENZ, AMPHETMU, THCU, LABBARB  Alcohol Level No results found for: [place] I have personally reviewed the radiological images below and agree with the radiology interpretations.  MRI / MRA Brain  12/02/2016 1. Patchy acute infarct in the posterior left insula/operculum corresponding to the left MCA M2 lesion seen yesterday. That lesion does not persist on today  intracranial MRA, although the axial FLAIR does suggest some residual slow arterial flow in that region. 2. Additional small scattered punctate acute infarcts elsewhere in the left MCA territory, but also in the right MCA and right PCA territory. Therefore, consider a recent embolic event from the heart or proximal aorta. 3. No associated hemorrhage or mass effect. 4. Superimposed chronic bilateral cerebellar artery ischemia. 5. No intracranial stenosis or circle of Willis branch occlusion on today's intracranial MRA.  Ct Angio Head W Or Wo Contrast Ct Angio Neck W Or Wo Contrast Ct Cerebral Perfusion W Contrast 12/01/2016 IMPRESSION:  1. Subocclusive distal left M2/M3 thrombus with associated delayed perfusion within the posterior left MCA territory without core infarct. As discussed above, thrombus is favored to be sub occlusive in nature, although collateral flow may be contributing to distal MCA branch opacification seen beyond the defect.  2. Otherwise negative CTA without large or proximal arterial branch occlusion. No other high-grade or correctable stenosis.  3. Mild aortic arch, common carotid, carotid siphon calcifications without flow-limiting stenosis.   Ct Head Code Stroke Wo Contrast 12/01/2016 IMPRESSION:  1. No acute cortically based infarct or acute intracranial hemorrhage identified.  2. ASPECTS is 10.  3. 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   IR -  Cerebral Angiogram 12/01/2016 Occluded distal parietal  M3 branch of inferior division  of Lt MCA.   Transthoracic Echocardiogram - Left ventricle: The cavity size was normal. There was moderate   concentric hypertrophy with severe hypertrophy of the mid   inferior myocardium (2.3cm). Systolic function was mildly to   moderately reduced. The estimated ejection fraction was in the   range of 40% to 45%. Wall motion was normal; there were no   regional wall motion abnormalities.  The study is not technically   sufficient to allow evaluation of LV diastolic function. - Aortic valve: Transvalvular velocity was within the normal range.   There was no stenosis. There was mild regurgitation. - Mitral valve: A bileaflet mechanical prosthesis was present and   functioning normally. Transvalvular velocity was within the   normal range. There was no evidence for stenosis. There was no   regurgitation. - Right ventricle: The cavity size was normal. Wall thickness was   normal. Systolic function was normal. - Atrial septum: No defect or patent foramen ovale was identified. - Tricuspid valve: There was moderate regurgitation. - Pulmonic valve: There was moderate regurgitation. - Pulmonary arteries: Systolic pressure was moderately to severely   increased. PA peak pressure: 58 mm Hg (S).   PHYSICAL EXAM Vitals:   12/03/16 0330 12/03/16 0400 12/03/16 0430 12/03/16 0500  BP: (!) 144/73 (!) 153/76 (!) 144/70 (!) 146/71  Pulse: (!) 58 (!) 59 (!) 53 (!) 51  Resp: 14 18 12 10   Temp:  (!) 97.4 F (36.3 C)    TempSrc:  Oral    SpO2: 100% 100% 100% 100%  Weight:      Height:        Temp:  [97.4 F (36.3 C)-98.7 F (37.1 C)] 97.4 F (36.3 C) (09/23 0400) Pulse Rate:  [46-80] 51 (09/23 0500) Resp:  [9-24] 10 (09/23 0500) BP: (105-192)/(56-116) 146/71 (09/23 0500) SpO2:  [90 %-100 %] 100 % (09/23 0500) Arterial Line BP: (141-189)/(50-73) 168/66 (09/23 0500)  General - Well nourished, well developed, mildly lethargic.  Ophthalmologic - Fundi not visualized due to noncooperation.  Cardiovascular - Regular rate and rhythm.  Mental Status -  Level of arousal and orientation to time, place, and person were intact. Language including expression, naming, repetition, comprehension was assessed and found intact except mild dysarthria and occasional paraphasic errors  Cranial Nerves II - XII - II - Visual field intact OU. III, IV, VI - Extraocular movements intact. V -  Facial sensation intact bilaterally. VII - Facial movement intact bilaterally. VIII - Hearing & vestibular intact bilaterally. X - Palate elevates symmetrically. XI - Chin turning & shoulder shrug intact bilaterally. XII - Tongue protrusion intact.  Motor Strength - The patient's strength was normal in all extremities except right proximal lower extremity muscle strength not able to test due to femoral sheath in place and pronator drift was absent.  Bulk was normal and fasciculations were absent.   Motor Tone - Muscle tone was assessed at the neck and appendages and was normal.  Reflexes - The patient's reflexes were 1+ in all extremities and he had no pathological reflexes.  Sensory - Light touch, temperature/pinprick were assessed and were symmetrical.    Coordination - The patient had normal movements in the hands with no ataxia or dysmetria.  Tremor was absent.  Gait and Station - deferred   ASSESSMENT/PLAN Mr. ALEXIS MIZUNO is a 76 y.o. male with history of rheumatic heart disease with mitral valve replacement (on Coumadin),  hypertension, hyperlipidemia, diabetes mellitus, chronic kidney disease, presenting with right-sided weakness and speech difficulties. He did not receive IV t-PA due to anticoagulation and late presentation.  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  Resultant  aphasia much improved  CT head - No acute cortically based infarct or acute intracranial hemorrhage identified.   CTA H&N - Subocclusive distal left M2/M3 thrombus with associated delayed perfusion within the posterior left MCA territory without core infarct.  Cerebral angiogram - Occluded distal parietal M3 branch of inferior division  of Lt MCA  MRI head - multifocal small patchy were punctate infarcts including Left MCA, right  MCA/PCA, right MCA/ACA territory infarcts  MRA head - recanalized left M2/M3 occlusion  2D Echo - EF 40-45%, mechanical MV functioning normally  LDL - 75  HgbA1c - 6.0  VTE prophylaxis - IV heparin Diet vegetarian Room service appropriate? Yes; Fluid consistency: Thin  aspirin 81 mg daily and warfarin daily prior to admission, now on aspirin 325 mg daily and heparin IV. Transition to Coumadin once femoral sheath out. 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 at home  INR 4.1 on 11/14/2016  Received recently antibiotics for infection  Admission INR 2.31->1.74  Continue hold off Coumadin until femoral sheath removed  Continue heparin IV, transition to Coumadin once femoral sheath out  Hypertension  Stable on Cardene  Permissive hypertension (OK if < 180/105) but gradually normalize in 5-7 days  Long-term BP goal normotensive  Hyperlipidemia  Home meds: Lipitor 20 mg daily   LDL 75 , goal < 70  Add Lipitor 40 mg daily  Continue statin at discharge  Diabetes  HgbA1c 6.0, goal < 7.0  Controlled  SSI  Other Stroke Risk Factors  Advanced age  Other Active Problems  Chronic kidney disease stage III Cre 1.74 - 1.7 - 1.53-1.40  Gastric cancer s/p gastrectomy  Hospital day # 2  This patient is critically ill due to cardioembolic stroke, mechanical MV, anticoagulation and cardiomyopathy at significant risk of neurological worsening, death form recurrent stroke, hemorrhagic conversion, bleeding, heart failure. This patient's care requires constant monitoring of vital signs, hemodynamics, respiratory and cardiac monitoring, review of multiple databases, neurological assessment, discussion with family, other specialists and medical decision making of high complexity. I spent 35  minutes of neurocritical care time in the care of this patient.  Rosalin Hawking, MD PhD Stroke Neurology 12/03/2016 10:09 AM   To contact Stroke Continuity provider, please refer to [email or web address] After hours, contact General Neurology

## 2016-12-04 LAB — GLUCOSE, CAPILLARY
GLUCOSE-CAPILLARY: 131 mg/dL — AB (ref 65–99)
GLUCOSE-CAPILLARY: 145 mg/dL — AB (ref 65–99)
GLUCOSE-CAPILLARY: 148 mg/dL — AB (ref 65–99)
Glucose-Capillary: 124 mg/dL — ABNORMAL HIGH (ref 65–99)
Glucose-Capillary: 119 mg/dL — ABNORMAL HIGH (ref 65–99)

## 2016-12-04 LAB — CBC
HCT: 37.1 % — ABNORMAL LOW (ref 39.0–52.0)
Hemoglobin: 12.5 g/dL — ABNORMAL LOW (ref 13.0–17.0)
MCH: 30.6 pg (ref 26.0–34.0)
MCHC: 33.7 g/dL (ref 30.0–36.0)
MCV: 90.7 fL (ref 78.0–100.0)
PLATELETS: 162 10*3/uL (ref 150–400)
RBC: 4.09 MIL/uL — ABNORMAL LOW (ref 4.22–5.81)
RDW: 12.4 % (ref 11.5–15.5)
WBC: 9.3 10*3/uL (ref 4.0–10.5)

## 2016-12-04 LAB — BASIC METABOLIC PANEL
ANION GAP: 5 (ref 5–15)
BUN: 15 mg/dL (ref 6–20)
CALCIUM: 8.1 mg/dL — AB (ref 8.9–10.3)
CO2: 26 mmol/L (ref 22–32)
CREATININE: 1.5 mg/dL — AB (ref 0.61–1.24)
Chloride: 109 mmol/L (ref 101–111)
GFR, EST AFRICAN AMERICAN: 50 mL/min — AB (ref >60)
GFR, EST NON AFRICAN AMERICAN: 43 mL/min — AB (ref >60)
Glucose, Bld: 123 mg/dL — ABNORMAL HIGH (ref 65–99)
Potassium: 3.6 mmol/L (ref 3.5–5.1)
SODIUM: 140 mmol/L (ref 135–145)

## 2016-12-04 LAB — PROTIME-INR
INR: 1.35
PROTHROMBIN TIME: 16.5 s — AB (ref 11.4–15.2)

## 2016-12-04 LAB — HEPARIN LEVEL (UNFRACTIONATED): HEPARIN UNFRACTIONATED: 0.36 [IU]/mL (ref 0.30–0.70)

## 2016-12-04 MED ORDER — HYDRALAZINE HCL 20 MG/ML IJ SOLN: 5.0000 mg | INTRAMUSCULAR | Status: DC | PRN

## 2016-12-04 NOTE — Progress Notes (Signed)
PT Cancellation Note  Patient Details Name: Robert [name] MRN: [id number] DOB: 02/10/1941   Cancelled Treatment:    Reason Eval/Treat Not Completed: Patient not medically ready. Pt currently on bedrest. Will await increased activity orders prior to initiating PT eval.   Thelma Comp 12/04/2016, 7:05 AM   Rolinda Roan, PT, DPT Acute Rehabilitation Services Pager: [phone]

## 2016-12-04 NOTE — Progress Notes (Signed)
12/04/2016 Pt on bedrest per RN until 12:45 pm.  Stroke MD says after bedrest we can mobilize this PM.  PT to check back in PM as time allows.   Thanks,  Barbarann Ehlers. [place], [place], DPT [phone]

## 2016-12-04 NOTE — Progress Notes (Signed)
[place] for Heparin Indication: mechanical MVR, CODE STROKE  Allergies  Allergen Reactions  . Penicillins Nausea And Vomiting    Has patient had a PCN reaction causing immediate rash, facial/tongue/throat swelling, SOB or lightheadedness with hypotension: No Has patient had a PCN reaction causing severe rash involving mucus membranes or skin necrosis: No Has patient had a PCN reaction that required hospitalization: Unknown Has patient had a PCN reaction occurring within the last 10 years: No If all of the above answers are "NO", then may proceed with Cephalosporin use.  . Vancomycin Other (See Comments)    Kidney problems    Patient Measurements: Height: 5\' 6"  (167.6 cm) Weight: 120 lb 13 oz (54.8 kg) IBW/kg (Calculated) : 63.8 Heparin Dosing Weight: 55 kg  Vital Signs: Temp: 97.6 F (36.4 C) (09/24 0400) Temp Source: Oral (09/24 0400) BP: 120/53 (09/24 0700) Pulse Rate: 58 (09/24 0700)  Labs:  Recent Labs  12/01/16 1717  12/02/16 0236  12/03/16 0734 12/03/16 1513 12/03/16 1905 12/04/16 0530  HGB 12.5*  < > 12.2*  --  13.0  --   --  12.5*  HCT 37.4*  < > 37.5*  --  39.8  --   --  37.1*  PLT 174  --  170  --  170  --   --  162  APTT 37*  --   --   --   --   --   --   --   LABPROT 25.2*  --  24.1*  --  20.2* 19.2*  --  16.5*  INR 2.31  --  2.18  --  1.74 1.63  --  1.35  HEPARINUNFRC  --   --   --   < > 0.57  --  0.49 0.36  CREATININE 1.74*  < > 1.53*  --  1.40*  --   --  1.50*  < > = values in this interval not displayed.  Assessment: 76 yo male on warfarin PTA for mechanical MVR.  Pt developed R sided weakness, aphasia, and dysarthria earlier today.  Presented to ED as CODE STROKE.  Did not receive tPA due to elevated INR and out of time frame.  Pt did go to IR for bilateral angiograms and still has groin sheath in place.   Heparin level is therapeutic at 0.36. Continues to have some oozing from right groin sheath. Pt received  a total of 10mg  IV vitamin K yesterday.   Pt is on warfarin prior to admission: 6 mg qThurs, 3 mg on all other days  Goal of Therapy:  Heparin level 0.3-0.5 units/ml Monitor platelets by anticoagulation protocol: Yes   Plan:  Continue heparin gtt 700 units/hr Daily heparin level and CBC  Salome Arnt, PharmD, BCPS 12/04/2016 8:01 AM

## 2016-12-04 NOTE — Progress Notes (Signed)
STROKE TEAM PROGRESS NOTE   SUBJECTIVE (INTERVAL HISTORY) His wife is at the bedside. On cardene overnight and groin bleeding better. INR today 1.3. Femoral sheath removed. Pending PT/OT. On heparin drip. Will need to start coumadin tomorrow.    OBJECTIVE Temp:  [97.3 F (36.3 C)-98.2 F (36.8 C)] 97.6 F (36.4 C) (09/24 0800) Pulse Rate:  [50-67] 60 (09/24 1200) Cardiac Rhythm: Normal sinus rhythm (09/24 0800) Resp:  [11-26] 19 (09/24 1200) BP: (103-200)/(52-96) 125/66 (09/24 1200) SpO2:  [88 %-100 %] 100 % (09/24 1200) Arterial Line BP: (123-154)/(50-63) 149/56 (09/24 0800)  CBC:   Recent Labs Lab 12/01/16 1717  12/03/16 0734 12/04/16 0530  WBC 6.0  < > 8.1 9.3  NEUTROABS 3.6  --   --   --   HGB 12.5*  < > 13.0 12.5*  HCT 37.4*  < > 39.8 37.1*  MCV 93.5  < > 92.8 90.7  PLT 174  < > 170 162  < > = values in this interval not displayed.  Basic Metabolic Panel:   Recent Labs Lab 12/03/16 0734 12/04/16 0530  NA 141 140  K 3.8 3.6  CL 110 109  CO2 26 26  GLUCOSE 110* 123*  BUN 12 15  CREATININE 1.40* 1.50*  CALCIUM 8.2* 8.1*    Lipid Panel:     Component Value Date/Time   CHOL 125 12/02/2016 0236   TRIG 27 12/02/2016 0236   HDL 45 12/02/2016 0236   CHOLHDL 2.8 12/02/2016 0236   VLDL 5 12/02/2016 0236   LDLCALC 75 12/02/2016 0236   HgbA1c:  Lab Results  Component Value Date   HGBA1C 6.0 (H) 12/02/2016   Urine Drug Screen: No results found for: LABOPIA, COCAINSCRNUR, LABBENZ, AMPHETMU, THCU, LABBARB  Alcohol Level No results found for: [place] I have personally reviewed the radiological images below and agree with the radiology interpretations.  MRI / MRA Brain  12/02/2016 1. Patchy acute infarct in the posterior left insula/operculum corresponding to the left MCA M2 lesion seen yesterday. That lesion does not persist on today intracranial MRA, although the axial FLAIR does suggest some residual slow arterial flow in that region. 2.  Additional small scattered punctate acute infarcts elsewhere in the left MCA territory, but also in the right MCA and right PCA territory. Therefore, consider a recent embolic event from the heart or proximal aorta. 3. No associated hemorrhage or mass effect. 4. Superimposed chronic bilateral cerebellar artery ischemia. 5. No intracranial stenosis or circle of Willis branch occlusion on today's intracranial MRA.  Ct Angio Head W Or Wo Contrast Ct Angio Neck W Or Wo Contrast Ct Cerebral Perfusion W Contrast 12/01/2016 IMPRESSION:  1. Subocclusive distal left M2/M3 thrombus with associated delayed perfusion within the posterior left MCA territory without core infarct. As discussed above, thrombus is favored to be sub occlusive in nature, although collateral flow may be contributing to distal MCA branch opacification seen beyond the defect.  2. Otherwise negative CTA without large or proximal arterial branch occlusion. No other high-grade or correctable stenosis.  3. Mild aortic arch, common carotid, carotid siphon calcifications without flow-limiting stenosis.   Ct Head Code Stroke Wo Contrast 12/01/2016 IMPRESSION:  1. No acute cortically based infarct or acute intracranial hemorrhage identified.  2. ASPECTS is 10.  3. 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   IR - Cerebral Angiogram 12/01/2016 Occluded distal parietal  M3 branch of inferior division  of Lt MCA.   Transthoracic Echocardiogram -  Left ventricle: The cavity size was normal. There was moderate   concentric hypertrophy with severe hypertrophy of the mid   inferior myocardium (2.3cm). Systolic function was mildly to   moderately reduced. The estimated ejection fraction was in the   range of 40% to 45%. Wall motion was normal; there were no   regional wall motion abnormalities. The study is not technically   sufficient to allow evaluation of LV diastolic function. - Aortic valve:  Transvalvular velocity was within the normal range.   There was no stenosis. There was mild regurgitation. - Mitral valve: A bileaflet mechanical prosthesis was present and   functioning normally. Transvalvular velocity was within the   normal range. There was no evidence for stenosis. There was no   regurgitation. - Right ventricle: The cavity size was normal. Wall thickness was   normal. Systolic function was normal. - Atrial septum: No defect or patent foramen ovale was identified. - Tricuspid valve: There was moderate regurgitation. - Pulmonic valve: There was moderate regurgitation. - Pulmonary arteries: Systolic pressure was moderately to severely   increased. PA peak pressure: 58 mm Hg (S).   PHYSICAL EXAM Vitals:   12/04/16 1030 12/04/16 1100 12/04/16 1130 12/04/16 1200  BP: 129/61 (!) 115/56 119/60 125/66  Pulse: (!) 57 (!) 55 (!) 53 60  Resp: 15 17 14 19   Temp:      TempSrc:      SpO2: 100% 100% 100% 100%  Weight:      Height:        Temp:  [97.3 F (36.3 C)-98.2 F (36.8 C)] 97.6 F (36.4 C) (09/24 0800) Pulse Rate:  [50-67] 60 (09/24 1200) Resp:  [11-26] 19 (09/24 1200) BP: (103-200)/(52-96) 125/66 (09/24 1200) SpO2:  [88 %-100 %] 100 % (09/24 1200) Arterial Line BP: (123-154)/(50-63) 149/56 (09/24 0800)  General - Well nourished, well developed, mildly lethargic.  Ophthalmologic - Fundi not visualized due to noncooperation.  Cardiovascular - Regular rate and rhythm.  Mental Status -  Level of arousal and orientation to time, place, and person were intact. Language including expression, naming, repetition, comprehension was assessed and found intact, mild dysarthria.  Cranial Nerves II - XII - II - Visual field intact OU. III, IV, VI - Extraocular movements intact. V - Facial sensation intact bilaterally. VII - Facial movement intact bilaterally. VIII - Hearing & vestibular intact bilaterally. X - Palate elevates symmetrically. XI - Chin turning &  shoulder shrug intact bilaterally. XII - Tongue protrusion intact.  Motor Strength - The patient's strength was normal in all extremities except right proximal lower extremity muscle strength not able to test due to within 4 hours of femoral sheath removal and pronator drift was absent.  Bulk was normal and fasciculations were absent.   Motor Tone - Muscle tone was assessed at the neck and appendages and was normal.  Reflexes - The patient's reflexes were 1+ in all extremities and he had no pathological reflexes.  Sensory - Light touch, temperature/pinprick were assessed and were symmetrical.    Coordination - The patient had normal movements in the hands with no ataxia or dysmetria.  Tremor was absent.  Gait and Station - deferred   ASSESSMENT/PLAN Mr. Robert [name] is a 76 y.o. male with history of rheumatic heart disease with mitral valve replacement (on Coumadin), hypertension, hyperlipidemia, diabetes mellitus, chronic kidney disease, presenting with right-sided weakness and speech difficulties. He did not receive IV t-PA due to anticoagulation and late presentation.  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  Resultant  aphasia much improved  CT head - No acute cortically based infarct or acute intracranial hemorrhage identified.   CTA H&N - Subocclusive distal left M2/M3 thrombus with associated delayed perfusion within the posterior left MCA territory without core infarct.  Cerebral angiogram - Occluded distal parietal M3 branch of inferior division  of Lt MCA  MRI head - multifocal small patchy were punctate infarcts including Left MCA, right MCA/PCA, right MCA/ACA territory infarcts  MRA head - recanalized left M2/M3 occlusion  2D Echo - EF 40-45%, mechanical MV functioning normally  LDL - 75  HgbA1c -  6.0  VTE prophylaxis - IV heparin Diet vegetarian Room service appropriate? Yes; Fluid consistency: Thin  aspirin 81 mg daily and warfarin daily prior to admission, now on aspirin 325 mg daily and heparin IV. Transition to Coumadin tomorow. 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 at home  INR 4.1 on 11/14/2016  Received recently antibiotics for infection  Admission INR 2.31->1.74->1.35  Continue hold off Coumadin until femoral sheath removed  Continue heparin IV, transition to Coumadin tomorrow. INR goal 2.5-3.5  Hypertension  Stable on Cardene  Permissive hypertension (OK if < 180/105) but gradually normalize in 5-7 days  Long-term BP goal normotensive  Hyperlipidemia  Home meds: Lipitor 20 mg daily   LDL 75 , goal < 70  Add Lipitor 40 mg daily  Continue statin at discharge  Diabetes  HgbA1c 6.0, goal < 7.0  Controlled  SSI  Other Stroke Risk Factors  Advanced age  Other Active Problems  Chronic kidney disease stage III Cre 1.74-1.7-1.53-1.40-1.50  Gastric cancer s/p gastrectomy  Hospital day # 3  This patient is critically ill due to cardioembolic stroke, mechanical MV, anticoagulation and cardiomyopathy at significant risk of neurological worsening, death form recurrent stroke, hemorrhagic conversion, bleeding, heart failure. This patient's care requires constant monitoring of vital signs, hemodynamics, respiratory and cardiac monitoring, review of multiple databases, neurological assessment, discussion with family, other specialists and medical decision making of high complexity. I spent 35 minutes of neurocritical care time in the care of this patient.  Rosalin Hawking, MD PhD Stroke Neurology 12/04/2016 12:36 PM   To contact Stroke Continuity provider, please  refer to [email or web address] After hours, contact General Neurology

## 2016-12-04 NOTE — Evaluation (Signed)
Physical Therapy Evaluation Patient Details Name: Robert [name] MRN: [id number] DOB: Feb 23, 1941 Today's Date: 12/04/2016   History of Present Illness  76 y.o. male admitted on 12/01/16 for R sided weakness and speech difficulty.  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  NIHSS was 3 initially.  Pt s/p S/P bilateral common carotid Arteriograms on 12/01/16 which showed accluded distal prietal M3 branch of inferior division L MCA.  MRI showed patchy acute infarct in the posterior left insula/operculumcorresponding to the left MCA M2 lesion seen yesterday. That lesion does not persist on today intracranial MRA, although the axial FLAIR does suggest some residual slow arterial flow in that region.  Additional small scattered punctate acute infarcts elsewhere inthe left MCA territory, but also in the right MCA and right PCAterritory. Therefore, consider a recent embolic event from the heart or proximal aorta.  Pt with significant PMH of MVR, HTN, HOH, DM2, and CKD III.  Clinical Impression  Pt's wife is helping him during gait so much so that I cannot tell how well he is walking, however, he was able to walk a short, slow distance into the hallway with wife's assist. He showed some signs of instability, but seated MMT showed equal weakness bil as well as some difficulty understanding what I was asking of him (HOH vs language barrier vs cognitive deficits or some combination thereof).  Pt will likely do well enough to d/c home with his wife with OP PT f/u.  Further assessment of vision needed as he was not leaving enough space for his wife to walk through the door with him.  We may have to request wife be hands-off next session to get a better sense of how he is truly doing on his feet.   PT to follow acutely for deficits listed below.     Follow Up Recommendations Outpatient PT;Supervision for mobility/OOB    Equipment Recommendations  None recommended by PT    Recommendations for Other  Services   NA    Precautions / Restrictions Precautions Precautions: Fall      Mobility  Bed Mobility Overal bed mobility: Needs Assistance Bed Mobility: Supine to Sit     Supine to sit: Min assist;HOB elevated     General bed mobility comments: Min assist to support trunk to get to sitting EOB.    Transfers Overall transfer level: Needs assistance Equipment used: 1 person hand held assist Transfers: Sit to/from Stand Sit to Stand: Min guard         General transfer comment: Min guard assist for safety and balance during transitions.   Ambulation/Gait Ambulation/Gait assistance: Min assist;Mod assist Ambulation Distance (Feet): 90 Feet Assistive device: 1 person hand held assist Gait Pattern/deviations: Step-through pattern;Shuffle;Staggering left;Staggering right Gait velocity: decreased Gait velocity interpretation: Below normal speed for age/gender General Gait Details: Difficult to tell how much assist he needed as wife was holding onto him with significantly more force than was likely needed.  He had a mildly staggering and very slow gait pattern, however, it may have been from her pulling on him while he walked.  She did not want him to go far given he had not eaten and not been up in "72 hours".  Pt would likely preform better, but difficult for this therapist to assess today as wife is very involved.       Modified Rankin (Stroke Patients Only) Modified Rankin (Stroke Patients Only) Pre-Morbid Rankin Score: No symptoms Modified Rankin: Moderately severe disability  Balance Overall balance assessment: Needs assistance Sitting-balance support: Feet supported;No upper extremity supported Sitting balance-Leahy Scale: Good     Standing balance support: Single extremity supported Standing balance-Leahy Scale: Fair                               Pertinent Vitals/Pain Pain Assessment: No/denies pain    Home Living Family/patient expects to  be discharged to:: Private residence Living Arrangements: Spouse/significant other Available Help at Discharge: Family Type of Home: House Home Access: Stairs to enter   Technical brewer of Steps: 3 Home Layout: Two level;Full bath on main level;Able to live on main level with bedroom/bathroom Home Equipment: None      Prior Function Level of Independence: Independent         Comments: Pt is a Health and safety inspector at [employer], works full time, 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 and his wife have been married 81 years     Hand Dominance   Dominant Hand: Right    Extremity/Trunk Assessment   Upper Extremity Assessment Upper Extremity Assessment: Defer to OT evaluation    Lower Extremity Assessment Lower Extremity Assessment: Generalized weakness (4/5, poor effort, seemed equal bil)    Cervical / Trunk Assessment Cervical / Trunk Assessment: Normal  Communication   Communication: HOH;Other (comment) (English is his second language)  Cognition Arousal/Alertness: Lethargic Behavior During Therapy: Flat affect                                   General Comments: Difficult to assess due to pt saying so little during our session. He would speak when spoken to, but was very flat and would not say much. His wife did most of the talking for him.              Assessment/Plan    PT Assessment Patient needs continued PT services  PT Problem List Decreased strength;Decreased activity tolerance;Decreased balance;Decreased mobility;Decreased cognition       PT Treatment Interventions DME instruction;Gait training;Stair training;Functional mobility training;Therapeutic activities;Therapeutic exercise;Balance training;Neuromuscular re-education;Cognitive remediation;Patient/family education    PT Goals (Current goals can be found in the Care Plan section)  Acute Rehab PT Goals Patient Stated Goal: wife would like him to rest and eat PT Goal Formulation: With  patient/family Time For Goal Achievement: 12/18/16 Potential to Achieve Goals: Good    Frequency Min 4X/week           AM-PAC PT "6 Clicks" Daily Activity  Outcome Measure Difficulty turning over in bed (including adjusting bedclothes, sheets and blankets)?: None Difficulty moving from lying on back to sitting on the side of the bed? : Unable Difficulty sitting down on and standing up from a chair with arms (e.g., wheelchair, bedside commode, etc,.)?: Unable Help needed moving to and from a bed to chair (including a wheelchair)?: A Little Help needed walking in hospital room?: A Little Help needed climbing 3-5 steps with a railing? : A Little 6 Click Score: 15    End of Session Equipment Utilized During Treatment: Gait belt Activity Tolerance: Patient limited by fatigue Patient left: in chair;with call bell/phone within reach;with family/visitor present Nurse Communication: Mobility status PT Visit Diagnosis: Unsteadiness on feet (R26.81);Other symptoms and signs involving the nervous system (J81.191)    Time: 4782-9562 PT Time Calculation (min) (ACUTE ONLY): 27 min   Charges:          Wells Guiles  Hervey Ard, PT, DPT [phone]   PT Evaluation $PT Eval Moderate Complexity: 1 Mod PT Treatments $Gait Training: 8-22 mins   12/04/2016, 5:45 PM

## 2016-12-04 NOTE — Progress Notes (Signed)
0931am 5FR rt femoral sheath pulled at 931am using 5Fr Exoseal device and manual pressure.  Hemostasis obtained at 7092HV.  No complications.  Site reviewed with Presenter, broadcasting.  Distal pusles 2+ bilaterally.  Tegaderm and pressure dressing applied to site.

## 2016-12-04 NOTE — Progress Notes (Signed)
OT Cancellation Note  Patient Details Name: Robert [name] MRN: [id number] DOB: April 24, 1940   Cancelled Treatment:    Reason Eval/Treat Not Completed: Patient not medically ready (bedrest)  Peri Maris  [phone] 12/04/2016, 7:24 AM

## 2016-12-05 ENCOUNTER — Encounter (HOSPITAL_COMMUNITY): Payer: Self-pay | Admitting: Interventional Radiology

## 2016-12-05 DIAGNOSIS — E1159 Type 2 diabetes mellitus with other circulatory complications: Secondary | ICD-10-CM

## 2016-12-05 LAB — BASIC METABOLIC PANEL
ANION GAP: 4 — AB (ref 5–15)
BUN: 19 mg/dL (ref 6–20)
CALCIUM: 8.3 mg/dL — AB (ref 8.9–10.3)
CO2: 26 mmol/L (ref 22–32)
Chloride: 108 mmol/L (ref 101–111)
Creatinine, Ser: 1.66 mg/dL — ABNORMAL HIGH (ref 0.61–1.24)
GFR, EST AFRICAN AMERICAN: 44 mL/min — AB (ref >60)
GFR, EST NON AFRICAN AMERICAN: 38 mL/min — AB (ref >60)
Glucose, Bld: 173 mg/dL — ABNORMAL HIGH (ref 65–99)
Potassium: 3.5 mmol/L (ref 3.5–5.1)
SODIUM: 138 mmol/L (ref 135–145)

## 2016-12-05 LAB — CBC
HEMATOCRIT: 37.5 % — AB (ref 39.0–52.0)
Hemoglobin: 12.4 g/dL — ABNORMAL LOW (ref 13.0–17.0)
MCH: 30.5 pg (ref 26.0–34.0)
MCHC: 33.1 g/dL (ref 30.0–36.0)
MCV: 92.1 fL (ref 78.0–100.0)
PLATELETS: 163 10*3/uL (ref 150–400)
RBC: 4.07 MIL/uL — ABNORMAL LOW (ref 4.22–5.81)
RDW: 12.7 % (ref 11.5–15.5)
WBC: 8.7 10*3/uL (ref 4.0–10.5)

## 2016-12-05 LAB — HEPARIN LEVEL (UNFRACTIONATED): HEPARIN UNFRACTIONATED: 0.35 [IU]/mL (ref 0.30–0.70)

## 2016-12-05 LAB — GLUCOSE, CAPILLARY
GLUCOSE-CAPILLARY: 109 mg/dL — AB (ref 65–99)
GLUCOSE-CAPILLARY: 138 mg/dL — AB (ref 65–99)
GLUCOSE-CAPILLARY: 175 mg/dL — AB (ref 65–99)

## 2016-12-05 MED ORDER — ENOXAPARIN SODIUM 60 MG/0.6ML ~~LOC~~ SOLN
60.0000 mg | SUBCUTANEOUS | Status: DC
  Administered 2016-12-05: 15:00:00 60 mg via SUBCUTANEOUS
  Filled 2016-12-05: qty 0.6

## 2016-12-05 MED ORDER — ENOXAPARIN SODIUM 60 MG/0.6ML ~~LOC~~ SOLN: 60.0000 mg | SUBCUTANEOUS | 0 refills | Status: DC

## 2016-12-05 MED ORDER — WARFARIN SODIUM 3 MG PO TABS: ORAL_TABLET | ORAL | 1 refills | Status: DC

## 2016-12-05 MED ORDER — WARFARIN - PHARMACIST DOSING INPATIENT
Freq: Every day | Status: DC
  Administered 2016-12-05: 18:00:00

## 2016-12-05 MED ORDER — WARFARIN SODIUM 6 MG PO TABS
6.0000 mg | ORAL_TABLET | Freq: Once | ORAL | Status: AC
  Administered 2016-12-05: 6 mg via ORAL
  Filled 2016-12-05: qty 1

## 2016-12-05 NOTE — Progress Notes (Addendum)
Theodosia Blender, Theo Dills, RN        # 5.   S/W JULIA @ [facility] # [phone]   LOVENOX  55 MG - NOT LISTED   1. LOVENOX 60 MG BID X 10 DAY   COVER- YES  CO-PAY- $ 30.00  PRIOR APPROVAL- NO    2. ENOXAPARIN 60 MG BID X 10 DAY   COVER- YES  CO-PAY- $ 22.19  PRIOR APPROVAL- NO     PREFERRED PHARMACY : [facility]             Pt and wife made aware. Whitman Hero RN,BSN,CM

## 2016-12-05 NOTE — Evaluation (Signed)
Occupational Therapy Evaluation Patient Details Name: Robert [name] MRN: [id number] DOB: December 05, 1940 Today's Date: 12/05/2016    History of Present Illness 76 y.o. male admitted on 12/01/16 for R sided weakness and speech difficulty.  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  NIHSS was 3 initially.  Pt s/p S/P bilateral common carotid Arteriograms on 12/01/16 which showed accluded distal prietal M3 branch of inferior division L MCA.  MRI showed patchy acute infarct in the posterior left insula/operculumcorresponding to the left MCA M2 lesion seen yesterday. That lesion does not persist on today intracranial MRA, although the axial FLAIR does suggest some residual slow arterial flow in that region.  Additional small scattered punctate acute infarcts elsewhere inthe left MCA territory, but also in the right MCA and right PCAterritory. Therefore, consider a recent embolic event from the heart or proximal aorta.  Pt with significant PMH of MVR, HTN, HOH, DM2, and CKD III.   Clinical Impression   Pt admitted with above. He demonstrates the below listed deficits and will benefit from continued OT to maximize safety and independence with BADLs.  Pt presents to OT with decreased balance, impaired visual saccades.   Cognition is intact for basic info, but will require further testing.   He requires min guard assist for ADLs.  He lives with wife who is very supportive.  Plan is to discharge home with supervision from wife.  Recommend OPOT.       Follow Up Recommendations  Outpatient OT;Supervision/Assistance - 24 hour    Equipment Recommendations  None recommended by OT    Recommendations for Other Services       Precautions / Restrictions Precautions Precautions: Fall      Mobility Bed Mobility Overal bed mobility: Modified Independent                Transfers Overall transfer level: Needs assistance   Transfers: Sit to/from Stand;Stand Pivot Transfers Sit to Stand:  Supervision Stand pivot transfers: Min guard            Balance Overall balance assessment: Needs assistance Sitting-balance support: Feet supported;No upper extremity supported Sitting balance-Leahy Scale: Good     Standing balance support: No upper extremity supported Standing balance-Leahy Scale: Fair Standing balance comment: Pt with LOB with head turns requiring min guard assist.  he reports he has had long standing balance deficits and has noticed he loses his balance when he turns his head                            ADL either performed or assessed with clinical judgement   ADL Overall ADL's : Needs assistance/impaired Eating/Feeding: Independent   Grooming: Wash/dry hands;Wash/dry face;Oral care;Brushing hair;Min guard;Standing   Upper Body Bathing: Min guard;Sitting   Lower Body Bathing: Min guard;Sit to/from stand   Upper Body Dressing : Set up;Sitting   Lower Body Dressing: Min guard;Sit to/from stand   Toilet Transfer: Min guard;Ambulation;Comfort height toilet;Grab bars   Toileting- Clothing Manipulation and Hygiene: Minimal assistance;Sit to/from stand   Tub/ Shower Transfer: Walk-in shower;Min guard;Ambulation Tub/Shower Transfer Details (indicate cue type and reason): discussed possibility of shower seat with pt, however, he feels he is okay without.  Wife will supervise him while showering  Functional mobility during ADLs: Min guard       Vision Baseline Vision/History: Wears glasses Wears Glasses: At all times Patient Visual Report: No change from baseline Vision Assessment?: Yes Eye Alignment: Within Functional  Limits Ocular Range of Motion: Within Functional Limits Alignment/Gaze Preference: Within Defined Limits Tracking/Visual Pursuits: Able to track stimulus in all quads without difficulty Saccades: Decreased speed of saccadic movement Visual Fields: No apparent deficits Additional Comments: dynamic saccades slow to the Rt.       Perception Perception Perception Tested?: Yes   Praxis Praxis Praxis tested?: Within functional limits    Pertinent Vitals/Pain Pain Assessment: No/denies pain     Hand Dominance Right   Extremity/Trunk Assessment Upper Extremity Assessment Upper Extremity Assessment: Overall WFL for tasks assessed   Lower Extremity Assessment Lower Extremity Assessment: Defer to PT evaluation   Cervical / Trunk Assessment Cervical / Trunk Assessment: Normal   Communication Communication Communication: HOH;Other (comment) (English is his second language )   Cognition Arousal/Alertness: Awake/alert Behavior During Therapy: WFL for tasks assessed/performed Overall Cognitive Status: Within Functional Limits for tasks assessed                                 General Comments: WFL for basic tasks.  Processing questionably a bit slower, but this may be pt's personality.   He was able to perform serial counting/subracting by 2's to/from 100 with no errors.     General Comments  wife present throughout     Exercises     Shoulder Instructions      [place] expects to be discharged to:: Private residence Living Arrangements: Spouse/significant other Available Help at Discharge: Family Type of Home: House Home Access: Stairs to enter [employer] of Steps: 3   Home Layout: Two level;Full bath on main level;Able to live on main level with bedroom/bathroom Alternate Level Stairs-Number of Steps: flight Alternate Level Stairs-Rails: Right Bathroom Shower/Tub: Occupational psychologist: Standard     Home Equipment: None      Lives With: Spouse    Prior Functioning/Environment Level of Independence: Independent        Comments: Pt is a Health and safety inspector at [employer], works full time, 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 and his wife have been married 9 years        OT Problem List: Decreased strength;Impaired balance (sitting and/or  standing);Decreased cognition;Impaired vision/perception      OT Treatment/Interventions: Self-care/ADL training;DME and/or AE instruction;Cognitive remediation/compensation;Therapeutic activities;Patient/family education;Balance training;Visual/perceptual remediation/compensation    OT Goals(Current goals can be found in the care plan section) Acute Rehab OT Goals Patient Stated Goal: to return to work  OT Goal Formulation: With patient Time For Goal Achievement: 12/19/16 Potential to Achieve Goals: Good ADL Goals Pt Will Perform Grooming: with modified independence;standing Pt Will Perform Lower Body Bathing: with modified independence;sit to/from stand Pt Will Transfer to Toilet: with modified independence;ambulating;regular height toilet Pt Will Perform Toileting - Clothing Manipulation and hygiene: with modified independence;sit to/from stand Pt Will Perform Tub/Shower Transfer: Shower transfer;with supervision;ambulating Additional ADL Goal #1: Pt will complete moderately challenging math activities mod I   OT Frequency: Min 2X/week   Barriers to D/C:            Co-evaluation              AM-PAC PT "6 Clicks" Daily Activity     Outcome Measure Help from another person eating meals?: None Help from another person taking care of personal grooming?: A Little Help from another person toileting, which includes using toliet, bedpan, or urinal?: A Little Help from another person bathing (including washing, rinsing, drying)?: A Little Help from  another person to put on and taking off regular upper body clothing?: A Little Help from another person to put on and taking off regular lower body clothing?: A Little 6 Click Score: 19   End of Session Equipment Utilized During Treatment: Gait belt Nurse Communication: Mobility status  Activity Tolerance: Patient tolerated treatment well Patient left: in bed;with call bell/phone within reach;with family/visitor present  OT Visit  Diagnosis: Unsteadiness on feet (R26.81)                Time: 7793-9030 OT Time Calculation (min): 32 min Charges:  OT General Charges $OT Visit: 1 Visit OT Evaluation $OT Eval Moderate Complexity: 1 Mod OT Treatments $Therapeutic Activity: 8-22 mins G-Codes:     [facility], OTR/L [phone]   Lucille Passy M 12/05/2016, 12:34 PM

## 2016-12-05 NOTE — Progress Notes (Signed)
Pt. transferred from 4N to 5W-21 and walked from W/C to bed; wife with pt.; alert and oriented x4; oriented to room and call button.

## 2016-12-05 NOTE — Progress Notes (Deleted)
Stroke Discharge Summary  Patient ID: Robert [name]   MRN: [id number]      DOB: 29-Mar-1940  Date of Admission: 12/01/2016 Date of Discharge: 12/05/2016  Attending Physician:  Rosalin Hawking, MD, Stroke MD Consultant(s):    None  Patient's PCP:  Burman Freestone, MD  DISCHARGE DIAGNOSIS:  Stroke: Multifocal small patchy or punctate infarcts including Left MCA, right MCA/PCA, right MCA/ACA territory infarcts - cardial embolic from mechanical mitral valve in the setting of subtherapeutic INR.  Active Problems:   Cerebral embolism with cerebral infarction   Acute ischemic stroke (HCC)   Mechanical valve   Chronic anticoagulation   HTN   HLD   DM   CKD stage III   Gastric cancer s/p gastrectomy   Past Medical History:  Diagnosis Date  . BPH (benign prostatic hyperplasia)   . Chronic anticoagulation    coumadin for mechanical valves  . CKD (chronic kidney disease), stage III   . Diabetes mellitus type 2, diet-controlled ([place])   . Gastric cancer ([place]) 2005   s/p gastrectomy  . High cholesterol   . HOH (hard of hearing)   . Hypertension   . Rheumatic heart disease   . S/P MVR (mitral valve replacement) 2015   41mm StJude mechanical valve   Past Surgical History:  Procedure Laterality Date  . CARDIAC CATHETERIZATION N/A 05/10/2015   Procedure: Right/Left Heart Cath and Coronary Angiography;  Surgeon: Burnell Blanks, MD;  Location: [place] [facility];  Service: Cardiovascular;  Laterality: N/A;  . HERNIA REPAIR    . IR ANGIO INTRA EXTRACRAN SEL COM CAROTID INNOMINATE BILAT MOD SED  12/01/2016  . IR ANGIO VERTEBRAL SEL SUBCLAVIAN INNOMINATE UNI R MOD SED  12/01/2016  . MITRAL VALVE REPLACEMENT  10/24/2013   8mm StJude mechanical valve  . PARTIAL GASTRECTOMY  2005    Allergies as of 12/05/2016      Reactions   Penicillins Nausea And Vomiting   Has patient had a PCN reaction causing immediate rash, facial/tongue/throat swelling, SOB or lightheadedness with  hypotension: No Has patient had a PCN reaction causing severe rash involving mucus membranes or skin necrosis: No Has patient had a PCN reaction that required hospitalization: Unknown Has patient had a PCN reaction occurring within the last 10 years: No If all of the above answers are "NO", then may proceed with Cephalosporin use.   Vancomycin Other (See Comments)   Kidney problems      Medication List    TAKE these medications   aspirin EC 81 MG tablet Take 81 mg by mouth daily.   atorvastatin 20 MG tablet Commonly known as:  LIPITOR Take 1 tablet (20 mg total) by mouth at bedtime.   carvedilol 6.25 MG tablet Commonly known as:  COREG TAKE 1 TABLET BY MOUTH TWICE A DAY WITH A MEAL What changed:  how much to take  how to take this  when to take this  additional instructions   chlorhexidine 0.12 % solution Commonly known as:  PERIDEX by Mouth Rinse route See admin instructions. Swish and spit small amount by mouth daily at bedtime   enoxaparin 60 MG/0.6ML injection Commonly known as:  LOVENOX Inject 0.6 mLs (60 mg total) into the skin daily.   ferrous sulfate 325 (65 FE) MG tablet Take 1 tablet (325 mg total) by mouth 2 (two) times daily with a meal.   furosemide 20 MG tablet Commonly known as:  LASIX Take 20 mg by mouth daily. What  changed:  Another medication with the same name was removed. Continue taking this medication, and follow the directions you see here.   isosorbide mononitrate 30 MG 24 hr tablet Commonly known as:  IMDUR Take 1 tablet (30 mg total) by mouth daily.   multivitamin with minerals Tabs tablet Take 1 tablet by mouth daily.   tamsulosin 0.4 MG Caps capsule Commonly known as:  FLOMAX TAKE 1 CAPSULE (0.4 MG TOTAL) BY MOUTH DAILY AFTER SUPPER.   vitamin B-12 1000 MCG tablet Commonly known as:  [employer]S VITAMIN B12 Take 1 tablet (1,000 mcg total) by mouth daily.   VITAMIN B1 PO Take 1 tablet by mouth daily.   warfarin 3 MG tablet Commonly  known as:  COUMADIN Take 6mg  (2 tablet) 9/25 and 9/26 and then 3mg  (1 tab) daily after. Follow up in coumadin clinic on 9/28 and 10/1 for INR check. Goal INR 2.5-3.5. Once INR reach the goal, stop lovenox injection. Continue to follow up with coumadin clinic for coumadin dosing. What changed:  additional instructions  Another medication with the same name was removed. Continue taking this medication, and follow the directions you see here.            Discharge Care Instructions        Start     Ordered   12/06/16 0000  enoxaparin (LOVENOX) 60 MG/0.6ML injection  Every 24 hours     12/05/16 1622   12/05/16 0000  warfarin (COUMADIN) 3 MG tablet     12/05/16 1622   12/05/16 0000  Ambulatory referral to Physical Therapy     12/05/16 1622   12/05/16 0000  Ambulatory referral to Occupational Therapy     12/05/16 1622   12/05/16 0000  Ambulatory referral to Neurology    Comments:  Pt will follow up with stroke MD Erlinda Hong preferred, if not available, then consider Leonie Man, Penumalli or Ahern) at [facility] in about 2 months. Thanks.   12/05/16 1622   12/05/16 0000  Increase activity slowly     12/05/16 1623   12/05/16 0000  Diet - low sodium heart healthy     12/05/16 1623      LABORATORY STUDIES CBC    Component Value Date/Time   WBC 8.7 12/05/2016 0927   RBC 4.07 (L) 12/05/2016 0927   HGB 12.4 (L) 12/05/2016 0927   HCT 37.5 (L) 12/05/2016 0927   PLT 163 12/05/2016 0927   MCV 92.1 12/05/2016 0927   MCH 30.5 12/05/2016 0927   MCHC 33.1 12/05/2016 0927   RDW 12.7 12/05/2016 0927   LYMPHSABS 1.2 12/01/2016 1717   MONOABS 0.5 12/01/2016 1717   EOSABS 0.6 12/01/2016 1717   BASOSABS 0.1 12/01/2016 1717   CMP    Component Value Date/Time   NA 138 12/05/2016 0927   K 3.5 12/05/2016 0927   CL 108 12/05/2016 0927   CO2 26 12/05/2016 0927   GLUCOSE 173 (H) 12/05/2016 0927   BUN 19 12/05/2016 0927   CREATININE 1.66 (H) 12/05/2016 0927   CREATININE 2.06 (H) 06/06/2016 1006   CALCIUM  8.3 (L) 12/05/2016 0927   PROT 6.9 12/01/2016 1717   ALBUMIN 3.6 12/01/2016 1717   AST 32 12/01/2016 1717   ALT 27 12/01/2016 1717   ALKPHOS 69 12/01/2016 1717   BILITOT 1.0 12/01/2016 1717   GFRNONAA 38 (L) 12/05/2016 0927   GFRAA 44 (L) 12/05/2016 0927   COAGS Lab Results  Component Value Date   INR 1.35 12/04/2016   INR 1.63 12/03/2016  INR 1.74 12/03/2016   Lipid Panel    Component Value Date/Time   CHOL 125 12/02/2016 0236   TRIG 27 12/02/2016 0236   HDL 45 12/02/2016 0236   CHOLHDL 2.8 12/02/2016 0236   VLDL 5 12/02/2016 0236   LDLCALC 75 12/02/2016 0236   HgbA1C  Lab Results  Component Value Date   HGBA1C 6.0 (H) 12/02/2016   Urinalysis    Component Value Date/Time   COLORURINE YELLOW 12/02/2016 1655   APPEARANCEUR CLEAR 12/02/2016 1655   LABSPEC 1.019 12/02/2016 1655   PHURINE 7.0 12/02/2016 1655   GLUCOSEU NEGATIVE 12/02/2016 1655   HGBUR SMALL (A) 12/02/2016 1655   BILIRUBINUR NEGATIVE 12/02/2016 1655   KETONESUR NEGATIVE 12/02/2016 1655   PROTEINUR NEGATIVE 12/02/2016 1655   NITRITE NEGATIVE 12/02/2016 1655   LEUKOCYTESUR SMALL (A) 12/02/2016 1655   Urine Drug Screen No results found for: LABOPIA, COCAINSCRNUR, LABBENZ, AMPHETMU, THCU, LABBARB  Alcohol Level No results found for: [facility]   SIGNIFICANT DIAGNOSTIC STUDIES MRI / MRA Brain  12/02/2016 1. Patchy acute infarct in the posterior left insula/operculum corresponding to the left MCA M2 lesion seen yesterday. That lesion does not persist on today intracranial MRA, although the axial FLAIR does suggest some residual slow arterial flow in that region. 2. Additional small scattered punctate acute infarcts elsewhere in the left MCA territory, but also in the right MCA and right PCA territory. Therefore, consider a recent embolic event from the heart or proximal aorta. 3. No associated hemorrhage or mass effect. 4. Superimposed chronic bilateral cerebellar artery ischemia. 5. No intracranial  stenosis or circle of Willis branch occlusion on today's intracranial MRA.  Ct Angio Head W Or Wo Contrast Ct Angio Neck W Or Wo Contrast Ct Cerebral Perfusion W Contrast 12/01/2016 IMPRESSION:  1. Subocclusive distal left M2/M3 thrombus with associated delayed perfusion within the posterior left MCA territory without core infarct. As discussed above, thrombus is favored to be sub occlusive in nature, although collateral flow may be contributing to distal MCA branch opacification seen beyond the defect.  2. Otherwise negative CTA without large or proximal arterial branch occlusion. No other high-grade or correctable stenosis.  3. Mild aortic arch, common carotid, carotid siphon calcifications without flow-limiting stenosis.   Ct Head Code Stroke Wo Contrast 12/01/2016 IMPRESSION:  1. No acute cortically based infarct or acute intracranial hemorrhage identified.  2. ASPECTS is 10.  3. 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   IR - Cerebral Angiogram 12/01/2016 Occluded distal parietal M3 branch of inferior division of Lt MCA.   Transthoracic Echocardiogram - Left ventricle: The cavity size was normal. There was moderate concentric hypertrophy with severe hypertrophy of the mid inferior myocardium (2.3cm). Systolic function was mildly to moderately reduced. The estimated ejection fraction was in the range of 40% to 45%. Wall motion was normal; there were no regional wall motion abnormalities. The study is not technically sufficient to allow evaluation of LV diastolic function. - Aortic valve: Transvalvular velocity was within the normal range. There was no stenosis. There was mild regurgitation. - Mitral valve: A bileaflet mechanical prosthesis was present and functioning normally. Transvalvular velocity was within the normal range. There was no evidence for stenosis. There was no regurgitation. - Right ventricle: The  cavity size was normal. Wall thickness was normal. Systolic function was normal. - Atrial septum: No defect or patent foramen ovale was identified. - Tricuspid valve: There was moderate regurgitation. - Pulmonic valve: There was moderate regurgitation. - Pulmonary arteries:  Systolic pressure was moderately to severely increased. PA peak pressure: 58 mm Hg (S).    HISTORY OF PRESENT ILLNESS Robert [name] a 76 y.o.malePMH of HTN HLD, CKD, mechanical mitral valve on Coumadin, last INR 4, Brought in as a code stroke for right-sided weakness, aphasia and dysarthria that started at 12:45 PM. 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 He went to his office. He spoke with his wife and brother. His wife picked him up from the office and to come to urgent care. At the urgent care he was evaluated and there was a concern for stroke and he was brought into the [facility] emergency room. He is on Coumadin, with his last INR at 4.0. He was not a candidate for TPA because he was outside the window by the time he came here and he was on Coumadin. For EMS, he had right arm and leg drift, right facial droop,some word finding difficulty and slurred speech. He was evaluated at the emergency room bridge and at the time had right facial droop, dysarthria and mild aphasia. He had no arm or leg weakness at the time. No sensory loss.   LKW: 1245 hrs. On 12/01/2016. tpa g ven?: no, on Coumadin therapeutic INR,and outside the window Premorbid modified Rankin scale (mRS):0   HOSPITAL COURSE Robert [name] is a 76 y.o. male with history of rheumatic heart disease with mitral valve replacement (on Coumadin), hypertension, hyperlipidemia, diabetes mellitus, chronic kidney disease, presenting with right-sided weakness and speech difficulties. He did not receive IV t-PA due to anticoagulation and late  presentation.  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  Resultant  aphasia much improved  CT head - No acute cortically based infarct or acute intracranial hemorrhage identified.   CTA H&N - Subocclusive distal left M2/M3 thrombus with associated delayed perfusion within the posterior left MCA territory without core infarct.  Cerebral angiogram - Occluded distal parietal M3 branch of inferior division of Lt MCA  MRI head - multifocal small patchy were punctate infarcts including Left MCA, right MCA/PCA, right MCA/ACA territory infarcts  MRA head - recanalized left M2/M3 occlusion  2D Echo - EF 40-45%, mechanical MV functioning normally  LDL - 75  HgbA1c - 6.0  VTE prophylaxis - IV heparin  Diet vegetarian Room service appropriate? Yes; Fluid consistency: Thin  aspirin 81 mg daily and warfarin daily prior to admission, now on aspirin 325 mg daily and heparin IV. Transition to Coumadin tomorow. 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 at home  INR 4.1 on 11/14/2016  Received recently antibiotics for infection  Admission INR 2.31->1.74->1.35  Continue hold off Coumadin until femoral sheath removed  Continue heparin IV, transition to Coumadin tomorrow. INR goal 2.5-3.5  Hypertension  Stable on Cardene              Permissive hypertension (OK if < 180/105) but gradually normalize in 5-7 days              Long-term BP goal normotensive  Hyperlipidemia  Home meds: Lipitor 20 mg daily   LDL 75 , goal <  70  Add Lipitor 40 mg daily  Continue statin at discharge  Diabetes  HgbA1c 6.0, goal <  7.0  Controlled  SSI  Other Stroke Risk Factors  Advanced age  Other Active Problems  Chronic kidney disease stage III Cre 1.74-1.7-1.53-1.40-1.50  Gastric cancer s/p gastrectomy  DISCHARGE EXAM Blood pressure 107/61, pulse (!) 55, temperature 98.1 F (36.7 C), temperature source Oral, resp. rate 16, height 5\' 6"  (1.676 m), weight 120 lb 13 oz (54.8 kg), SpO2 99 %.  General - Well nourished, well developed, not in acute distress.  Ophthalmologic - Fundi not visualized due to noncooperation.  Cardiovascular - Regular rate and rhythm.  Mental Status -  Level of arousal and orientation to time, place, and person were intact. Language including expression, naming, repetition, comprehension was assessed and found intact, mild dysarthria.  Cranial Nerves II - XII - II - Visual field intact OU. III, IV, VI - Extraocular movements intact. V - Facial sensation intact bilaterally. VII - Facial movement intact bilaterally. VIII - Hearing & vestibular intact bilaterally. X - Palate elevates symmetrically. XI - Chin turning & shoulder shrug intact bilaterally. XII - Tongue protrusion intact.  Motor Strength - The patient's strength was normal in all extremities and pronator drift was absent.  Bulk was normal and fasciculations were absent.   Motor Tone - Muscle tone was assessed at the neck and appendages and was normal.  Reflexes - The patient's reflexes were 1+ in all extremities and he had no pathological reflexes.  Sensory - Light touch, temperature/pinprick were assessed and were symmetrical.    Coordination - The patient had normal movements in the hands with no ataxia or dysmetria.  Tremor was absent.  Gait and Station - deferred  Discharge Diet   Diet vegetarian Room service appropriate? Yes; Fluid consistency: Thin Diet - low sodium heart healthy liquids  DISCHARGE PLAN  Disposition:  Home with outpt PT/OT  warfarin daily and lovenox for secondary  stroke prevention.  Ongoing risk factor control by Primary Care Physician at time of discharge  Follow-up Burman Freestone, MD in 2 weeks.  Follow-up with Dr. Rosalin Hawking [facility] in 6 weeks, office to schedule an appointment.  Take coumadin 6mg  (2 tablet) daily on 9/25 and 9/26 and then 3mg  (1 tab) daily after. Follow up in coumadin clinic on 9/28 and 10/1 for INR check. Goal INR 2.5-3.5. Once INR reach the goal, stop lovenox injection. Continue to follow up with coumadin clinic for coumadin dosing.  before INR reach goal, take lovenox 60mg  daily injection underneath the skin. Once INR 2.5-3.5, stop the lovenox injection.   follow up with cardiology Dr. Debara Pickett as scheduled  follow up with coumadin clinic this Friday and next Monday and then as scheduled. INR goal 2.5-3.5  if you take medications may interfere with coumadin, you need more frequent INR check.  40 minutes were spent preparing discharge.  Rosalin Hawking, MD PhD Stroke Neurology 12/05/2016 4:56 PM

## 2016-12-05 NOTE — Progress Notes (Signed)
ANTICOAGULATION CONSULT NOTE - Follow Up Consult  Pharmacy Consult for Lovenox >> Coumadin Indication: mechanical MVR, CVA  Allergies  Allergen Reactions  . Penicillins Nausea And Vomiting    Has patient had a PCN reaction causing immediate rash, facial/tongue/throat swelling, SOB or lightheadedness with hypotension: No Has patient had a PCN reaction causing severe rash involving mucus membranes or skin necrosis: No Has patient had a PCN reaction that required hospitalization: Unknown Has patient had a PCN reaction occurring within the last 10 years: No If all of the above answers are "NO", then may proceed with Cephalosporin use.  . Vancomycin Other (See Comments)    Kidney problems    Patient Measurements: Height: 5\' 6"  (167.6 cm) Weight: 120 lb 13 oz (54.8 kg) IBW/kg (Calculated) : 63.8  Vital Signs:    Labs:  Recent Labs  12/03/16 0734 12/03/16 1513 12/03/16 1905 12/04/16 0530 12/05/16 0927  HGB 13.0  --   --  12.5* 12.4*  HCT 39.8  --   --  37.1* 37.5*  PLT 170  --   --  162 163  LABPROT 20.2* 19.2*  --  16.5*  --   INR 1.74 1.63  --  1.35  --   HEPARINUNFRC 0.57  --  0.49 0.36 0.35  CREATININE 1.40*  --   --  1.50* 1.66*    Estimated Creatinine Clearance: 28.9 mL/min (A) (by C-G formula based on SCr of 1.66 mg/dL (H)).   Medications:  Scheduled:  .  stroke: mapping our early stages of recovery book   Does not apply Once  . amLODipine  10 mg Oral Daily  . aspirin EC  325 mg Oral Daily  . atorvastatin  40 mg Oral q1800  . carvedilol  6.25 mg Oral BID WC  . furosemide  20 mg Oral Daily  . insulin aspart  0-5 Units Subcutaneous QHS  . insulin aspart  0-9 Units Subcutaneous TID WC  . isosorbide mononitrate  30 mg Oral Daily  . multivitamin with minerals  1 tablet Oral Daily  . tamsulosin  0.4 mg Oral QPC supper  . thiamine  100 mg Oral Daily  . vitamin B-12  1,000 mcg Oral Daily   Infusions:  . heparin 700 Units/hr (12/04/16 2000)    Assessment: 76  yo M presented as CODE STROKE with R sided weakness, aphasia, and dysarthria.  CT found acute ischemic stroke of the left MCA territory.  IR for bilateral angiograms.  Initially started on heparin.  Pharmacy has been asked to transition to Lovenox >> Coumadin for discharge.  Of note, patient received a total of 10mg  IV Vit K 9/23.  Will take some time for warfarin to overcome.  MD is aware and is planning 10d of Lovenox bridging at discharge.  Goal of Therapy:  INR 2.5-3.5 Anti-Xa level 0.6-1 units/ml 4hrs after LMWH dose given Monitor platelets by anticoagulation protocol: Yes   Plan:  D/C heparin and associated labs. Lovenox 60mg  SQ q24h until INR >2.5 (CrCl <30)  Coumadin >> would suggest 6 mg daily x 2 days. Then on 9/27 restart home regimen of 3mg  daily except 4.5mg  on Mon/Fri. Noted MD plans to check INR Fri/Mon.  Horton Chin, Pharm.D., BCPS Clinical Pharmacist Pager: [phone] Clinical phone for 12/05/2016 from 8:30-4:00 is [phone]. After 4pm, please call Main Rx [phone]) for assistance. 12/05/2016 12:15 PM

## 2016-12-05 NOTE — Plan of Care (Signed)
Problem: Education: Goal: Knowledge of [place] [facility] Education information/materials will improve Outcome: Progressing POC reviewed with pt.   

## 2016-12-05 NOTE — Progress Notes (Signed)
Discharge instructions, RX's and follow up appts explained and provided to patient verbalized understanding. Patient left floor via wheelchair accompanied by staff no c/o pain or shortness of breath.   Leiyah Maultsby Lynn, RN  

## 2016-12-05 NOTE — Progress Notes (Signed)
PT Cancellation Note  Patient Details Name: Robert [name] MRN: [id number] DOB: 03/21/1940   Cancelled Treatment:    Reason Eval/Treat Not Completed: Patient declined, no reason specified. Pt and wife were adamant that he had already walked with OT and did not need to ambulate again. Discussed differences between PT and OT but pt's and wife's mind set. Pt likely to d/c today. Encouraged participation in outpt PT/OT.   [place] 12/05/2016, 3:16 PM

## 2016-12-05 NOTE — Progress Notes (Signed)
Benefits check in process for Lovenox 55mg  twice a day. CM to f/u with results. Whitman Hero RN,BSN,CM

## 2016-12-05 NOTE — Discharge Summary (Signed)
Stroke Discharge Summary  Patient ID: Robert [name]   MRN: [id number]      DOB: Dec 23, 1940  Date of Admission: 12/01/2016 Date of Discharge: 12/05/2016  Attending Physician:  No att. providers found, Stroke MD Consultant(s):    None  Patient's PCP:  Burman Freestone, MD  DISCHARGE DIAGNOSIS:  Stroke: Multifocal small patchy or punctate infarcts including Left MCA, right MCA/PCA, right MCA/ACA territory infarcts - cardial embolic from mechanical mitral valve in the setting of subtherapeutic INR.  Active Problems:   Cerebral embolism with cerebral infarction   Acute ischemic stroke (HCC)   Mechanical valve   Chronic anticoagulation   HTN   HLD   DM   CKD stage III   Gastric cancer s/p gastrectomy   Past Medical History:  Diagnosis Date  . BPH (benign prostatic hyperplasia)   . Chronic anticoagulation    coumadin for mechanical valves  . CKD (chronic kidney disease), stage III   . Diabetes mellitus type 2, diet-controlled ([place])   . Gastric cancer ([place]) 2005   s/p gastrectomy  . High cholesterol   . HOH (hard of hearing)   . Hypertension   . Rheumatic heart disease   . S/P MVR (mitral valve replacement) 2015   65mm StJude mechanical valve   Past Surgical History:  Procedure Laterality Date  . CARDIAC CATHETERIZATION N/A 05/10/2015   Procedure: Right/Left Heart Cath and Coronary Angiography;  Surgeon: Burnell Blanks, MD;  Location: [facility];  Service: Cardiovascular;  Laterality: N/A;  . HERNIA REPAIR    . IR ANGIO INTRA EXTRACRAN SEL COM CAROTID INNOMINATE BILAT MOD SED  12/01/2016  . IR ANGIO VERTEBRAL SEL SUBCLAVIAN INNOMINATE UNI R MOD SED  12/01/2016  . MITRAL VALVE REPLACEMENT  10/24/2013   14mm StJude mechanical valve  . PARTIAL GASTRECTOMY  2005    Allergies as of 12/05/2016      Reactions   Penicillins Nausea And Vomiting   Has patient had a PCN reaction causing immediate rash, facial/tongue/throat swelling, SOB or lightheadedness with  hypotension: No Has patient had a PCN reaction causing severe rash involving mucus membranes or skin necrosis: No Has patient had a PCN reaction that required hospitalization: Unknown Has patient had a PCN reaction occurring within the last 10 years: No If all of the above answers are "NO", then may proceed with Cephalosporin use.   Vancomycin Other (See Comments)   Kidney problems      Medication List    TAKE these medications   aspirin EC 81 MG tablet Take 81 mg by mouth daily.   atorvastatin 20 MG tablet Commonly known as:  LIPITOR Take 1 tablet (20 mg total) by mouth at bedtime.   carvedilol 6.25 MG tablet Commonly known as:  COREG TAKE 1 TABLET BY MOUTH TWICE A DAY WITH A MEAL What changed:  how much to take  how to take this  when to take this  additional instructions   chlorhexidine 0.12 % solution Commonly known as:  PERIDEX by Mouth Rinse route See admin instructions. Swish and spit small amount by mouth daily at bedtime   enoxaparin 60 MG/0.6ML injection Commonly known as:  LOVENOX Inject 0.6 mLs (60 mg total) into the skin daily.   ferrous sulfate 325 (65 FE) MG tablet Take 1 tablet (325 mg total) by mouth 2 (two) times daily with a meal.   furosemide 20 MG tablet Commonly known as:  LASIX Take 20 mg by mouth daily.  What changed:  Another medication with the same name was removed. Continue taking this medication, and follow the directions you see here.   isosorbide mononitrate 30 MG 24 hr tablet Commonly known as:  IMDUR Take 1 tablet (30 mg total) by mouth daily.   multivitamin with minerals Tabs tablet Take 1 tablet by mouth daily.   tamsulosin 0.4 MG Caps capsule Commonly known as:  FLOMAX TAKE 1 CAPSULE (0.4 MG TOTAL) BY MOUTH DAILY AFTER SUPPER.   vitamin B-12 1000 MCG tablet Commonly known as:  [employer]S VITAMIN B12 Take 1 tablet (1,000 mcg total) by mouth daily.   VITAMIN B1 PO Take 1 tablet by mouth daily.   warfarin 3 MG tablet Commonly  known as:  COUMADIN Take 6mg  (2 tablet) 9/25 and 9/26 and then 3mg  (1 tab) daily after. Follow up in coumadin clinic on 9/28 and 10/1 for INR check. Goal INR 2.5-3.5. Once INR reach the goal, stop lovenox injection. Continue to follow up with coumadin clinic for coumadin dosing. What changed:  additional instructions  Another medication with the same name was removed. Continue taking this medication, and follow the directions you see here.            Discharge Care Instructions        Start     Ordered   12/06/16 0000  enoxaparin (LOVENOX) 60 MG/0.6ML injection  Every 24 hours     12/05/16 1622   12/05/16 0000  warfarin (COUMADIN) 3 MG tablet     12/05/16 1622   12/05/16 0000  Ambulatory referral to Physical Therapy     12/05/16 1622   12/05/16 0000  Ambulatory referral to Occupational Therapy     12/05/16 1622   12/05/16 0000  Ambulatory referral to Neurology    Comments:  Pt will follow up with stroke MD Erlinda Hong preferred, if not available, then consider Leonie Man, Penumalli or Ahern) at [facility] in about 2 months. Thanks.   12/05/16 1622   12/05/16 0000  Increase activity slowly     12/05/16 1623   12/05/16 0000  Diet - low sodium heart healthy     12/05/16 1623      LABORATORY STUDIES CBC    Component Value Date/Time   WBC 8.7 12/05/2016 0927   RBC 4.07 (L) 12/05/2016 0927   HGB 12.4 (L) 12/05/2016 0927   HCT 37.5 (L) 12/05/2016 0927   PLT 163 12/05/2016 0927   MCV 92.1 12/05/2016 0927   MCH 30.5 12/05/2016 0927   MCHC 33.1 12/05/2016 0927   RDW 12.7 12/05/2016 0927   LYMPHSABS 1.2 12/01/2016 1717   MONOABS 0.5 12/01/2016 1717   EOSABS 0.6 12/01/2016 1717   BASOSABS 0.1 12/01/2016 1717   CMP    Component Value Date/Time   NA 138 12/05/2016 0927   K 3.5 12/05/2016 0927   CL 108 12/05/2016 0927   CO2 26 12/05/2016 0927   GLUCOSE 173 (H) 12/05/2016 0927   BUN 19 12/05/2016 0927   CREATININE 1.66 (H) 12/05/2016 0927   CREATININE 2.06 (H) 06/06/2016 1006   CALCIUM  8.3 (L) 12/05/2016 0927   PROT 6.9 12/01/2016 1717   ALBUMIN 3.6 12/01/2016 1717   AST 32 12/01/2016 1717   ALT 27 12/01/2016 1717   ALKPHOS 69 12/01/2016 1717   BILITOT 1.0 12/01/2016 1717   GFRNONAA 38 (L) 12/05/2016 0927   GFRAA 44 (L) 12/05/2016 0927   COAGS Lab Results  Component Value Date   INR 1.35 12/04/2016   INR 1.63 12/03/2016  INR 1.74 12/03/2016   Lipid Panel    Component Value Date/Time   CHOL 125 12/02/2016 0236   TRIG 27 12/02/2016 0236   HDL 45 12/02/2016 0236   CHOLHDL 2.8 12/02/2016 0236   VLDL 5 12/02/2016 0236   LDLCALC 75 12/02/2016 0236   HgbA1C  Lab Results  Component Value Date   HGBA1C 6.0 (H) 12/02/2016   Urinalysis    Component Value Date/Time   COLORURINE YELLOW 12/02/2016 1655   APPEARANCEUR CLEAR 12/02/2016 1655   LABSPEC 1.019 12/02/2016 1655   PHURINE 7.0 12/02/2016 1655   GLUCOSEU NEGATIVE 12/02/2016 1655   HGBUR SMALL (A) 12/02/2016 1655   BILIRUBINUR NEGATIVE 12/02/2016 1655   KETONESUR NEGATIVE 12/02/2016 1655   PROTEINUR NEGATIVE 12/02/2016 1655   NITRITE NEGATIVE 12/02/2016 1655   LEUKOCYTESUR SMALL (A) 12/02/2016 1655   Urine Drug Screen No results found for: LABOPIA, COCAINSCRNUR, LABBENZ, AMPHETMU, THCU, LABBARB  Alcohol Level No results found for: [facility]   SIGNIFICANT DIAGNOSTIC STUDIES MRI / MRA Brain  12/02/2016 1. Patchy acute infarct in the posterior left insula/operculum corresponding to the left MCA M2 lesion seen yesterday. That lesion does not persist on today intracranial MRA, although the axial FLAIR does suggest some residual slow arterial flow in that region. 2. Additional small scattered punctate acute infarcts elsewhere in the left MCA territory, but also in the right MCA and right PCA territory. Therefore, consider a recent embolic event from the heart or proximal aorta. 3. No associated hemorrhage or mass effect. 4. Superimposed chronic bilateral cerebellar artery ischemia. 5. No intracranial  stenosis or circle of Willis branch occlusion on today's intracranial MRA.  Ct Angio Head W Or Wo Contrast Ct Angio Neck W Or Wo Contrast Ct Cerebral Perfusion W Contrast 12/01/2016 IMPRESSION:  1. Subocclusive distal left M2/M3 thrombus with associated delayed perfusion within the posterior left MCA territory without core infarct. As discussed above, thrombus is favored to be sub occlusive in nature, although collateral flow may be contributing to distal MCA branch opacification seen beyond the defect.  2. Otherwise negative CTA without large or proximal arterial branch occlusion. No other high-grade or correctable stenosis.  3. Mild aortic arch, common carotid, carotid siphon calcifications without flow-limiting stenosis.   Ct Head Code Stroke Wo Contrast 12/01/2016 IMPRESSION:  1. No acute cortically based infarct or acute intracranial hemorrhage identified.  2. ASPECTS is 10.  3. 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   IR - Cerebral Angiogram 12/01/2016 Occluded distal parietal M3 branch of inferior division of Lt MCA.   Transthoracic Echocardiogram - Left ventricle: The cavity size was normal. There was moderate concentric hypertrophy with severe hypertrophy of the mid inferior myocardium (2.3cm). Systolic function was mildly to moderately reduced. The estimated ejection fraction was in the range of 40% to 45%. Wall motion was normal; there were no regional wall motion abnormalities. The study is not technically sufficient to allow evaluation of LV diastolic function. - Aortic valve: Transvalvular velocity was within the normal range. There was no stenosis. There was mild regurgitation. - Mitral valve: A bileaflet mechanical prosthesis was present and functioning normally. Transvalvular velocity was within the normal range. There was no evidence for stenosis. There was no regurgitation. - Right ventricle: The  cavity size was normal. Wall thickness was normal. Systolic function was normal. - Atrial septum: No defect or patent foramen ovale was identified. - Tricuspid valve: There was moderate regurgitation. - Pulmonic valve: There was moderate regurgitation. - Pulmonary arteries:  Systolic pressure was moderately to severely increased. PA peak pressure: 58 mm Hg (S).    HISTORY OF PRESENT ILLNESS Robert [name] a 76 y.o.malePMH of HTN HLD, CKD, mechanical mitral valve on Coumadin, last INR 4, Brought in as a code stroke for right-sided weakness, aphasia and dysarthria that started at 12:45 PM. 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 He went to his office. He spoke with his wife and brother. His wife picked him up from the office and to come to urgent care. At the urgent care he was evaluated and there was a concern for stroke and he was brought into the [facility] emergency room. He is on Coumadin, with his last INR at 4.0. He was not a candidate for TPA because he was outside the window by the time he came here and he was on Coumadin. For EMS, he had right arm and leg drift, right facial droop,some word finding difficulty and slurred speech. He was evaluated at the emergency room bridge and at the time had right facial droop, dysarthria and mild aphasia. He had no arm or leg weakness at the time. No sensory loss.   LKW: 1245 hrs. On 12/01/2016. tpa g ven?: no, on Coumadin therapeutic INR,and outside the window Premorbid modified Rankin scale (mRS):0   HOSPITAL COURSE Mr. ARCHIT LEGER is a 76 y.o. male with history of rheumatic heart disease with mitral valve replacement (on Coumadin), hypertension, hyperlipidemia, diabetes mellitus, chronic kidney disease, presenting with right-sided weakness and speech difficulties. He did not receive IV t-PA due to anticoagulation and late  presentation.  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  Resultant  aphasia much improved  CT head - No acute cortically based infarct or acute intracranial hemorrhage identified.   CTA H&N - Subocclusive distal left M2/M3 thrombus with associated delayed perfusion within the posterior left MCA territory without core infarct.  Cerebral angiogram - Occluded distal parietal M3 branch of inferior division of Lt MCA  MRI head - multifocal small patchy were punctate infarcts including Left MCA, right MCA/PCA, right MCA/ACA territory infarcts  MRA head - recanalized left M2/M3 occlusion  2D Echo - EF 40-45%, mechanical MV functioning normally  LDL - 75  HgbA1c - 6.0  VTE prophylaxis - IV heparin  Diet vegetarian Room service appropriate? Yes; Fluid consistency: Thin  aspirin 81 mg daily and warfarin daily prior to admission, now on aspirin 325 mg daily and heparin IV. Transition to Coumadin tomorow. 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 at home  INR 4.1 on 11/14/2016  Received recently antibiotics for infection  Admission INR 2.31->1.74->1.35  Continue hold off Coumadin until femoral sheath removed  Continue heparin IV, transition to Coumadin tomorrow. INR goal 2.5-3.5  Hypertension  Stable on Cardene              Permissive hypertension (OK if < 180/105) but gradually normalize in 5-7 days              Long-term BP goal normotensive  Hyperlipidemia  Home meds: Lipitor 20 mg daily   LDL 75 , goal <  70  Add Lipitor 40 mg daily  Continue statin at discharge  Diabetes  HgbA1c 6.0, goal <  7.0  Controlled  SSI  Other Stroke Risk Factors  Advanced age  Other Active Problems  Chronic kidney disease stage III Cre 1.74-1.7-1.53-1.40-1.50  Gastric cancer s/p gastrectomy  DISCHARGE EXAM Blood pressure 107/61, pulse (!) 55, temperature 98.1 F (36.7 C), temperature source Oral, resp. rate 16, height 5\' 6"  (1.676 m), weight 120 lb 13 oz (54.8 kg), SpO2 99 %.  General - Well nourished, well developed, not in acute distress.  Ophthalmologic - Fundi not visualized due to noncooperation.  Cardiovascular - Regular rate and rhythm.  Mental Status -  Level of arousal and orientation to time, place, and person were intact. Language including expression, naming, repetition, comprehension was assessed and found intact, mild dysarthria.  Cranial Nerves II - XII - II - Visual field intact OU. III, IV, VI - Extraocular movements intact. V - Facial sensation intact bilaterally. VII - Facial movement intact bilaterally. VIII - Hearing & vestibular intact bilaterally. X - Palate elevates symmetrically. XI - Chin turning & shoulder shrug intact bilaterally. XII - Tongue protrusion intact.  Motor Strength - The patient's strength was normal in all extremities and pronator drift was absent.  Bulk was normal and fasciculations were absent.   Motor Tone - Muscle tone was assessed at the neck and appendages and was normal.  Reflexes - The patient's reflexes were 1+ in all extremities and he had no pathological reflexes.  Sensory - Light touch, temperature/pinprick were assessed and were symmetrical.    Coordination - The patient had normal movements in the hands with no ataxia or dysmetria.  Tremor was absent.  Gait and Station - deferred  Discharge Diet   Diet vegetarian Room service appropriate? Yes; Fluid consistency: Thin Diet - low sodium heart healthy liquids  DISCHARGE PLAN  Disposition:  Home with outpt PT/OT  warfarin daily and lovenox for secondary  stroke prevention.  Ongoing risk factor control by Primary Care Physician at time of discharge  Follow-up Burman Freestone, MD in 2 weeks.  Follow-up with Dr. [facility] in 6 weeks, office to schedule an appointment.  Take coumadin 6mg  (2 tablet) daily on 9/25 and 9/26 and then 3mg  (1 tab) daily after. Follow up in coumadin clinic on 9/28 and 10/1 for INR check. Goal INR 2.5-3.5. Once INR reach the goal, stop lovenox injection. Continue to follow up with coumadin clinic for coumadin dosing.  before INR reach goal, take lovenox 60mg  daily injection underneath the skin. Once INR 2.5-3.5, stop the lovenox injection.   follow up with cardiology Dr. Debara Pickett as scheduled  follow up with coumadin clinic this Friday and next Monday and then as scheduled. INR goal 2.5-3.5  if you take medications may interfere with coumadin, you need more frequent INR check.  40 minutes were spent preparing discharge.  Rosalin Hawking, MD PhD Stroke Neurology 12/05/2016 6:31 PM

## 2016-12-05 NOTE — Discharge Instructions (Signed)
- Take coumadin 6mg  (2 tablet) daily on 9/25 and 9/26 and then 3mg  (1 tab) daily after. Follow up in coumadin clinic on 9/28 and 10/1 for INR check. Goal INR 2.5-3.5. Once INR reach the goal, stop lovenox injection. Continue to follow up with coumadin clinic for coumadin dosing. - before INR reach goal, take lovenox 60mg  daily injection underneath the skin. Once INR 2.5-3.5, stop the lovenox injection.  - follow up with PCP in 1-2 weeks - outpt PT/OT. They will call you for appointment. - follow up with cardiology Dr. Debara Pickett as scheduled - follow up with coumadin clinic this Friday and next Monday and then as scheduled. INR goal 2.5-3.5 - if you take medications may interfere with coumadin, you need more frequent INR check. - follow up with Dr. Erlinda Hong in stroke clinic in 6 weeks. Office will call you for appointment.     Ischemic Stroke An ischemic stroke (cerebrovascular accident, or CVA) is the sudden death of brain tissue that occurs when an area of the brain does not get enough oxygen. It is a medical emergency that must be treated right away. An ischemic stroke can cause permanent loss of brain function. This can cause problems with how different parts of your body function. What are the causes? This condition is caused by a decrease of oxygen supply to an area of the brain, which may be the result of:  A small blood clot (embolus) or a buildup of plaque in the blood vessels (atherosclerosis) that blocks blood flow in the brain.  An abnormal heart rhythm (atrial fibrillation).  A blocked or damaged artery in the head or neck.  What increases the risk? Certain factors may make you more likely to develop this condition. Some of these factors are things that you can change, such as:  Obesity.  Smoking cigarettes.  Taking oral birth control, especially if you also use tobacco.  Physical inactivity.  Excessive alcohol use.  Use of illegal drugs, especially cocaine and  methamphetamine.  Other risk factors include:  High blood pressure (hypertension).  High cholesterol.  Diabetes mellitus.  Heart disease.  Being [place] American, Native American, Hispanic, or [place] Native.  Being over age 24.  Family history of stroke.  Previous history of blood clots, stroke, or transient ischemic attack (TIA).  Sickle cell disease.  Being a woman with a history of preeclampsia.  Migraine headache.  Sleep apnea.  Irregular heartbeats, such as atrial fibrillation.  Chronic inflammatory diseases, such as rheumatoid arthritis or lupus.  Blood clotting disorders (hypercoagulable state).  What are the signs or symptoms? Symptoms of this condition usually develop suddenly, or you may notice them after waking up from sleep. Symptoms may include sudden:  Weakness or numbness in your face, arm, or leg, especially on one side of your body.  Trouble walking or difficulty moving your arms or legs.  Loss of balance or coordination.  Confusion.  Slurred speech (dysarthria).  Trouble speaking, understanding speech, or both (aphasia).  Vision changes--such as double vision, blurred vision, or loss of vision--inone or both eyes.  Dizziness.  Nausea and vomiting.  Severe headache with no known cause. The headache is often described as the worst headache ever experienced.  If possible, make note of the exact time that you last felt like your normal self and what time your symptoms started. Tell your health care provider. If symptoms come and go, this could be a sign of a warning stroke, or TIA. Get help right away, even if  you feel better. How is this diagnosed? This condition may be diagnosed based on:  Your symptoms, your medical history, and a physical exam.  CT scan of the brain.  MRI.  CT angiogram. This test uses a computer to take X-rays of your arteries. A dye may be injected into your blood to show the inside of your blood vessels more  clearly.  MRI angiogram. This is a type of MRI that is used to evaluate the blood vessels.  Cerebral angiogram. This test uses X-rays and a dye to show the blood vessels in the brain and neck.  You may need to see a health care provider who specializes in stroke care. A stroke specialist can be seen in person or through communication using telephone or television technology (telemedicine). Other tests may also be done to find the cause of the stroke, such as:  Electrocardiogram (ECG).  Continuous heart monitoring.  Echocardiogram.  Carotid ultrasound.  A scan of the brain circulation.  Blood tests.  Sleep study to check for sleep apnea.  How is this treated? Treatment for this condition will depend on the duration, severity, and cause of your symptoms and on the area of the brain affected. It is very important to get treatment at the first sign of stroke symptoms. Some treatments work better if they are done within 3-6 hours of the onset of stroke symptoms. These initial treatments may include:  Aspirin.  Medicines to control blood pressure.  Medicine given by injection to dissolve the blood clot (thrombolytic).  Treatments given directly to the affected artery to remove or dissolve the blood clot.  Other treatment options may include:  Oxygen.  IV fluids.  Medicines to thin the blood (anticoagulants or antiplatelets).  Procedures to increase blood flow.  Medicines and changes to your diet may be used to help treat and manage risk factors for stroke, such as diabetes, high cholesterol, and high blood pressure. After a stroke, you may work with physical, speech, mental health, or occupational therapists to help you recover. Follow these instructions at home: Medicines  Take over-the-counter and prescription medicines only as told by your health care provider.  If you were told to take a medicine to thin your blood, such as aspirin or an anticoagulant, take it exactly  as told by your health care provider. ? Taking too much blood-thinning medicine can cause bleeding. ? If you do not take enough blood-thinning medicine, you will not have the protection that you need against another stroke and other problems.  Understand the side effects of taking anticoagulant medicine. When taking this type of medicine, make sure you: ? Hold pressure over any cuts for longer than usual. ? Tell your dentist and other health care providers that you are taking anticoagulants before you have any procedures that may cause bleeding. ? Avoid activities that may cause trauma or injury. Eating and drinking  Follow instructions from your health care provider about diet.  Eat healthy foods.  If your ability to swallow was affected by the stroke, you may need to take steps to avoid choking, such as: ? Taking small bites when eating. ? Eating foods that are soft or pureed. Safety  Follow instructions from your health care team about physical activity.  Use a walker or cane as told by your health care provider.  Take steps to create a safe home environment in order to reduce the risk of falls. This may include: ? Having your home looked at by specialists. ? Warden/ranger  grab bars in the bedroom and bathroom. ? Using safety equipment, such as raised toilets and a seat in the shower. General instructions  Do not use any tobacco products, such as cigarettes, chewing tobacco, and e-cigarettes. If you need help quitting, ask your health care provider.  Limit alcohol intake to no more than 1 drink a day for nonpregnant women and 2 drinks a day for men. One drink equals 12 oz of beer, 5 oz of wine, or 1 oz of hard liquor.  If you need help to stop using drugs or alcohol, ask your health care provider about a referral to a program or specialist.  Maintain an active and healthy lifestyle. Get regular exercise as told by your health care provider.  Keep all follow-up visits as told by  your health care provider, including visits with all specialists on your health care team. This is important. How is this prevented? Your risk of another stroke can be decreased by managing high blood pressure, high cholesterol, diabetes, heart disease, sleep apnea, and obesity. It can also be decreased by quitting smoking, limiting alcohol, and staying physically active. Your health care provider will continue to work with you on measures to prevent short-term and long-term complications of stroke. Get help right away if: You have:  Sudden weakness or numbness in your face, arm, or leg, especially on one side of your body.  Sudden confusion.  Sudden trouble speaking, understanding, or both (aphasia).  Sudden trouble seeing with one or both eyes.  Sudden trouble walking or difficulty moving your arms or legs.  Sudden dizziness.  Sudden loss of balance or coordination.  Sudden, severe headache with no known cause.  A partial or total loss of consciousness.  A seizure. Any of these symptoms may represent a serious problem that is an emergency. Do not wait to see if the symptoms will go away. Get medical help right away. Call your local emergency services (911 in [place] Do not drive yourself to the hospital. This information is not intended to replace advice given to you by your health care provider. Make sure you discuss any questions you have with your health care provider. Document Released: 02/27/2005 Document Revised: 08/10/2015 Document Reviewed: 05/26/2015 Elsevier Interactive Patient Education  2017 [employer].  Stroke Prevention Some medical conditions and behaviors are associated with an increased chance of having a stroke. You may prevent a stroke by making healthy choices and managing medical conditions. How can I reduce my risk of having a stroke?  Stay physically active. Get at least 30 minutes of activity on most or all days.  Do not smoke. It may also be helpful  to avoid exposure to secondhand smoke.  Limit alcohol use. Moderate alcohol use is considered to be: ? No more than 2 drinks per day for men. ? No more than 1 drink per day for nonpregnant women.  Eat healthy foods. This involves: ? Eating 5 or more servings of fruits and vegetables a day. ? Making dietary changes that address high blood pressure (hypertension), high cholesterol, diabetes, or obesity.  Manage your cholesterol levels. ? Making food choices that are high in fiber and low in saturated fat, trans fat, and cholesterol may control cholesterol levels. ? Take any prescribed medicines to control cholesterol as directed by your health care provider.  Manage your diabetes. ? Controlling your carbohydrate and sugar intake is recommended to manage diabetes. ? Take any prescribed medicines to control diabetes as directed by your health care provider.  Control your hypertension. ? Making food choices that are low in salt (sodium), saturated fat, trans fat, and cholesterol is recommended to manage hypertension. ? Ask your health care provider if you need treatment to lower your blood pressure. Take any prescribed medicines to control hypertension as directed by your health care provider. ? If you are 1-73 years of age, have your blood pressure checked every 3-5 years. If you are 15 years of age or older, have your blood pressure checked every year.  Maintain a healthy weight. ? Reducing calorie intake and making food choices that are low in sodium, saturated fat, trans fat, and cholesterol are recommended to manage weight.  Stop drug abuse.  Avoid taking birth control pills. ? Talk to your health care provider about the risks of taking birth control pills if you are over 35 years old, smoke, get migraines, or have ever had a blood clot.  Get evaluated for sleep disorders (sleep apnea). ? Talk to your health care provider about getting a sleep evaluation if you snore a lot or have  excessive sleepiness.  Take medicines only as directed by your health care provider. ? For some people, aspirin or blood thinners (anticoagulants) are helpful in reducing the risk of forming abnormal blood clots that can lead to stroke. If you have the irregular heart rhythm of atrial fibrillation, you should be on a blood thinner unless there is a good reason you cannot take them. ? Understand all your medicine instructions.  Make sure that other conditions (such as anemia or atherosclerosis) are addressed. Get help right away if:  You have sudden weakness or numbness of the face, arm, or leg, especially on one side of the body.  Your face or eyelid droops to one side.  You have sudden confusion.  You have trouble speaking (aphasia) or understanding.  You have sudden trouble seeing in one or both eyes.  You have sudden trouble walking.  You have dizziness.  You have a loss of balance or coordination.  You have a sudden, severe headache with no known cause.  You have new chest pain or an irregular heartbeat. Any of these symptoms may represent a serious problem that is an emergency. Do not wait to see if the symptoms will go away. Get medical help at once. Call your local emergency services (911 in [place] Do not drive yourself to the hospital. This information is not intended to replace advice given to you by your health care provider. Make sure you discuss any questions you have with your health care provider. Document Released: 04/06/2004 Document Revised: 08/05/2015 Document Reviewed: 08/30/2012 Elsevier Interactive Patient Education  2017 [employer].

## 2016-12-06 ENCOUNTER — Ambulatory Visit (INDEPENDENT_AMBULATORY_CARE_PROVIDER_SITE_OTHER): Payer: BC Managed Care – PPO | Admitting: Pharmacist Clinician (PhC)/ Clinical Pharmacy Specialist

## 2016-12-06 DIAGNOSIS — Z952 Presence of prosthetic heart valve: Secondary | ICD-10-CM

## 2016-12-06 DIAGNOSIS — Z7901 Long term (current) use of anticoagulants: Secondary | ICD-10-CM

## 2016-12-06 LAB — POCT INR: INR: 1.2

## 2016-12-07 ENCOUNTER — Encounter: Payer: Self-pay | Admitting: Internal Medicine

## 2016-12-07 ENCOUNTER — Ambulatory Visit (INDEPENDENT_AMBULATORY_CARE_PROVIDER_SITE_OTHER): Payer: BC Managed Care – PPO | Admitting: Internal Medicine

## 2016-12-07 ENCOUNTER — Ambulatory Visit (INDEPENDENT_AMBULATORY_CARE_PROVIDER_SITE_OTHER): Payer: BC Managed Care – PPO | Admitting: Pharmacist

## 2016-12-07 VITALS — BP 118/60 | HR 53 | Ht 66.0 in | Wt 124.2 lb

## 2016-12-07 DIAGNOSIS — Z7901 Long term (current) use of anticoagulants: Secondary | ICD-10-CM

## 2016-12-07 DIAGNOSIS — I639 Cerebral infarction, unspecified: Secondary | ICD-10-CM | POA: Diagnosis not present

## 2016-12-07 DIAGNOSIS — Z952 Presence of prosthetic heart valve: Secondary | ICD-10-CM

## 2016-12-07 DIAGNOSIS — I63412 Cerebral infarction due to embolism of left middle cerebral artery: Secondary | ICD-10-CM

## 2016-12-07 LAB — POCT INR: INR: 1.5

## 2016-12-07 NOTE — Patient Instructions (Signed)
Your physician recommends that you schedule a follow-up appointment in THREE MONTHS with Dr. Hilty.  

## 2016-12-08 ENCOUNTER — Telehealth: Payer: Self-pay | Admitting: Internal Medicine

## 2016-12-08 ENCOUNTER — Other Ambulatory Visit: Payer: Self-pay | Admitting: Pharmacist

## 2016-12-08 MED ORDER — ENOXAPARIN SODIUM 60 MG/0.6ML ~~LOC~~ SOLN: 60.0000 mg | Freq: Two times a day (BID) | SUBCUTANEOUS | 0 refills | Status: DC

## 2016-12-08 NOTE — Progress Notes (Signed)
OFFICE NOTE  Chief Complaint:  Follow-up hospitalization  Primary Care Physician: Burman Freestone, MD  HPI:  Robert [name] is a 76 y.o. 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 He has been followed by Dr. Geraldo Pitter at [facility] cardiology and was referred to Dr. Evelina Dun at [facility] in 2013 for evaluation of his mitral stenosis. He was followed clinically and underwent mechanical mitral valve replacement in 2015. Since then he's been compliant on warfarin and has had therapeutic to supratherapeutic INRs. LV function was normal prior to surgery however we cannot find echocardiographic results after his surgery. A heart catheterization performed prior to surgery demonstrated a 70% diagonal stenosis and 55% LAD stenosis, but he did not have any bypass surgery. He now presents with one week of progressive shortness of breath, orthopnea and leg edema concerning for heart failure. He seems to be responding to IV Lasix. Chest x-ray shows COPD changes, small bilateral pleural effusions and some basilar atelectasis. Labs indicate markedly elevated BNP of 1406 and troponin is negative 3. There appears to be a mild iron deficiency anemia with H&H of 9 and 29. INR initially was 4.37 that came down to 3.98. He was admitted and treated for heart failure with diuresis. He underwent right and left heart catheterization which demonstrated the following:   Prox RCA lesion, 20% stenosed.  Prox LAD to Mid LAD lesion, 30% stenosed.  Mid LAD lesion, 40% stenosed.  Ost 1st Diag to 1st Diag lesion, 50% stenosed.  Ost 2nd Diag lesion, 30% stenosed.  1. Mild to moderate non-obstructive CAD 2. Normal movement of mechanical mitral valve leaflets.  3. Elevated filling pressures suggesting residual volume overload.   He also had an echocardiogram which showed a newly reduced LVEF of  45-50%. At discharge he was scheduled to be on Lasix 60 mg daily, and increase from his prior home dose of 40 mg daily. It was also recommended that he discontinue valsartan and spironolactone. After discharge he had routine lab work which demonstrated acute renal failure and creatinine had increased from 1.8-2.9. He was also markedly hyperkalemic at 5.7. This was brought to the attention of the [facility] DOD Dr. Marlou Porch, who recommended hydration and avoiding potassium with a repeat metabolic profile the next day. The repeat metabolic profile showed creatinine had decreased to 2.7 and potassium was 5.3. Since discharge Mr. Willems had poor appetite, weight has remained stable and he is felt weak and had poor energy. Much worse than when he was discharged. I spent a good deal of time reviewing the patient's medications with his wife who had a 3 x 5 note card with his previous medications on the front and new medications on the rear. She tells me that he is actually taking all of these medications, therefore he is taking his old dose of Lasix 40 mg in addition to Lasix 60 mg daily as well as spironolactone and valsartan. Certainly explain his acute renal failure as well as hyperkalemia. I reviewed his discharge medication summary which clearly states to discontinue valsartan and Lasix and noted the dose changes of medications. He was seen in his primary care doctor's office after discharge however it appears that these medication changes were not identified. In addition, his INR is markedly elevated today at 6.2. This is up from 3.5 where it was 1 week ago. I suspect again this is related to renal failure, decreased appetite  and nutritional reasons. Medication noncompliance could be playing a role.  [name] returns today for follow-up. Since we sorted out his medications he's done much better. His creatinine has gone back down to baseline of 1.8. He is on iron and B12 and his hemoglobin and hematocrit of come up  to 10.7 and 32. He reports marked improvement in his energy. He does say that he has problems with decreased energy and sleep although he feels like he gets a restful night sleep. He denies any snoring or apnea. He wakes up fairly early in the morning and does yoga and often takes a nap in the morning. Unfortunately his INR became subtherapeutic after holding his warfarin for coagulopathy. On the 20th it was 1.4. Today his INR is 1.8.  10/18/2015  [name] returns today for follow-up of 2 recent hospitalizations. He was seen at [facility] and subsequently at [facility] a couple weeks later for small bowel obstruction. He does have a history of gastric cancer and is status post subtotal gastrectomy in 2005. Unfortunately he has not been able to eat and has lost a significant amount of weight. He is currently 119 pounds with a BMI of 19 and does feel somewhat weak. Fortunately, however his LV function has improved back to 55-60% by echo. He was seen in consultation and no further cardiac workup was recommended as he did have some mildly elevated troponins. Systolic to be nonspecific. He's had no problems with his mechanical mitral valve and INR today is finally controlled at 3.5. He tells me he is interested in getting some dental work including having several teeth pulled and having a denture placed at [facility] in the near future.  04/07/2016  [name] presents today with some recent dyspnea on exertion. He says over the past 2 weeks she's become short of breath walking up stairs and now walking on a flat surface. EF had improved recently up to 55-60% by echo, however this was during hospitalization for a small bowel obstruction. At the time he did have elevated troponins but was felt due to a noncardiac etiology. He denies any chest pain however his progressive dyspnea and fatigue with exertion could be due to coronary ischemia. He was noted to have moderate nonobstructive coronary disease by cath  more than a year ago.  04/18/2016  [name] was seen back today in follow-up. He reports some continued shortness of breath and now some lower strandy swelling. He underwent a Lexiscan Myoview which showed no ischemia however EF was back to 45%. He has had 7 pound weight gain since I last saw him consistent with acute systolic congestive heart failure. He is only on Lasix 20 mg every other day.  05/15/2016  Mr. Meiring returns today for follow-up. Blood pressure is elevated 156/70. He reports that increasing his Lasix has helped with some of his shortness of breath and abdominal swelling. Unfortunately did not get blood work drawn as I had requested. He is due for repeat metabolic profile and BNP. We discussed briefly the addition of Entresto he may be a good candidate for that. I like to evaluate his labs today and make a determination based on his renal function. He also is reported recently a decrease in appetite and early satiety, particularly with breads and other foods that he cannot tolerate. This sounds like more of a GI issue and I've encouraged him to follow-up with his gastroenterologist in [place].  06/06/2016  Mr. Mckillop was seen today in follow-up.  He reports breathing improved significantly as well as the swelling reduced after adding a second dose of diuretics to his regimen. Currently he is on Lasix 40 mg twice a day. Weight is gone down from 131-127 pounds. He has no lower extremity edema. In fact he is close to underweight, more consistent with the fact that his diet is very picky and he continues to have problems with his GI system. His wife says that he eats very scant portions. The pressure is well-controlled today. He has not had repeat lab work which I recommended to reassess his creatinine. In the past his creatinine is been somewhat higher on increased dose of diuretics, but we may need to allow this. I'd also like to see if his chronic kidney disease would support the use of  Entresto.  12/07/2016  Mr. Bolger was unfortunately recently hospitalized with aphasia and weakness and was found to have multiple vessel infarcts including the left MCA, right MCA/PCA, right MCA/ACA territory concerning for a cardioembolic phenomenon. It was thought this is related to a mechanical mitral valve and being subtherapeutic with his INR. He is confused as to recently as soon as 2 weeks prior to the incident his INR was well controlled. He had reported problems with eating and was being treated by gastroenterologist for what sounds like SIBO. It's possible that the combination of his antibiotics including Cipro and Flagyl, particularly the latter, may have affected his INR. His admission INR was 2.31 however subsequent INRs were 1.74 and 1.35. He is now on twice-daily Lovenox bridging and we are working to reestablish a therapeutic INR. Fortunately he seems to have some mild deficits with mild left facial droop and some dysarthria.  PMHx:  Past Medical History:  Diagnosis Date  . BPH (benign prostatic hyperplasia)   . Chronic anticoagulation    coumadin for mechanical valves  . CKD (chronic kidney disease), stage III   . Diabetes mellitus type 2, diet-controlled ([place])   . Gastric cancer ([place]) 2005   s/p gastrectomy  . High cholesterol   . HOH (hard of hearing)   . Hypertension   . Rheumatic heart disease   . S/P MVR (mitral valve replacement) 2015   48mm StJude mechanical valve    Past Surgical History:  Procedure Laterality Date  . CARDIAC CATHETERIZATION N/A 05/10/2015   Procedure: Right/Left Heart Cath and Coronary Angiography;  Surgeon: Burnell Blanks, MD;  Location: [facility];  Service: Cardiovascular;  Laterality: N/A;  . HERNIA REPAIR    . IR ANGIO INTRA EXTRACRAN SEL COM CAROTID INNOMINATE BILAT MOD SED  12/01/2016  . IR ANGIO VERTEBRAL SEL SUBCLAVIAN INNOMINATE UNI R MOD SED  12/01/2016  . MITRAL VALVE REPLACEMENT  10/24/2013   75mm StJude mechanical  valve  . PARTIAL GASTRECTOMY  2005    FAMHx:  Family History  Problem Relation Age of Onset  . Hypertension Mother   . Diabetes Mother   . Cancer Brother     SOCHx:   reports that he has never smoked. He has never used smokeless tobacco. He reports that he does not drink alcohol or use drugs.  ALLERGIES:  Allergies  Allergen Reactions  . Penicillins Nausea And Vomiting    Has patient had a PCN reaction causing immediate rash, facial/tongue/throat swelling, SOB or lightheadedness with hypotension: No Has patient had a PCN reaction causing severe rash involving mucus membranes or skin necrosis: No Has patient had a PCN reaction that required hospitalization: Unknown Has patient had a  PCN reaction occurring within the last 10 years: No If all of the above answers are "NO", then may proceed with Cephalosporin use.  . Vancomycin Other (See Comments)    Kidney problems    ROS: Pertinent items noted in HPI and remainder of comprehensive ROS otherwise negative.  HOME MEDS: Current Outpatient Prescriptions  Medication Sig Dispense Refill  . aspirin EC 81 MG tablet Take 81 mg by mouth daily.    Marland Kitchen atorvastatin (LIPITOR) 20 MG tablet Take 1 tablet (20 mg total) by mouth at bedtime. 90 tablet 3  . carvedilol (COREG) 6.25 MG tablet TAKE 1 TABLET BY MOUTH TWICE A DAY WITH A MEAL (Patient taking differently: Take 6.25 mg by mouth 2 (two) times daily with a meal. ) 180 tablet 3  . chlorhexidine (PERIDEX) 0.12 % solution by Mouth Rinse route See admin instructions. Swish and spit small amount by mouth daily at bedtime    . enoxaparin (LOVENOX) 60 MG/0.6ML injection Inject 0.6 mLs (60 mg total) into the skin daily. 10 Syringe 0  . ferrous sulfate 325 (65 FE) MG tablet Take 1 tablet (325 mg total) by mouth 2 (two) times daily with a meal. 30 tablet 3  . furosemide (LASIX) 20 MG tablet Take 20 mg by mouth daily.    . isosorbide mononitrate (IMDUR) 30 MG 24 hr tablet Take 1 tablet (30 mg total) by  mouth daily. 90 tablet 3  . Multiple Vitamin (MULTIVITAMIN WITH MINERALS) TABS tablet Take 1 tablet by mouth daily.    . tamsulosin (FLOMAX) 0.4 MG CAPS capsule TAKE 1 CAPSULE (0.4 MG TOTAL) BY MOUTH DAILY AFTER SUPPER. 30 capsule 10  . Thiamine Mononitrate (VITAMIN B1 PO) Take 1 tablet by mouth daily.     . vitamin B-12 (CYANOCOBALAMIN) 1000 MCG tablet Take 1 tablet (1,000 mcg total) by mouth daily. 7 tablet 0  . warfarin (COUMADIN) 3 MG tablet Take 6mg  (2 tablet) 9/25 and 9/26 and then 3mg  (1 tab) daily after. Follow up in coumadin clinic on 9/28 and 10/1 for INR check. Goal INR 2.5-3.5. Once INR reach the goal, stop lovenox injection. Continue to follow up with coumadin clinic for coumadin dosing. 100 tablet 1   No current facility-administered medications for this visit.     LABS/IMAGING: Results for orders placed or performed in visit on 12/07/16 (from the past 48 hour(s))  POCT INR     Status: None   Collection Time: 12/07/16  5:11 PM  Result Value Ref Range   INR 1.5    No results found.  WEIGHTS: Wt Readings from Last 3 Encounters:  12/07/16 124 lb 3.2 oz (56.3 kg)  12/01/16 120 lb 13 oz (54.8 kg)  06/06/16 127 lb 6.4 oz (57.8 kg)    VITALS: BP 118/60   Pulse (!) 53   Ht 5\' 6"  (1.676 m)   Wt 124 lb 3.2 oz (56.3 kg)   SpO2 98%   BMI 20.05 kg/m   EXAM: General appearance: alert, cachectic and no distress Neck: no carotid bruit, no JVD and Mild left facial droop Lungs: clear to auscultation bilaterally Heart: regular rate and rhythm, S1, S2 normal and sharp mechanical valve sounds Abdomen: soft, non-tender; bowel sounds normal; no masses,  no organomegaly and mildly protuberant Extremities: extremities normal, atraumatic, no cyanosis or edema Pulses: 2+ and symmetric Skin: Skin color, texture, turgor normal. No rashes or lesions Neurologic: Grossly normal Psych: Pleasant  EKG: Deferred  ASSESSMENT: 1. Recent multi-infarct territory stroke-concerning for  cardioembolic phenomenon 2.  Acute systolic congestive heart failure-EF reduced to 45%, now up to 55-60% (2017) 3. Mild to moderate nonobstructive coronary artery disease by cath 4. Normally functioning mechanical mitral valve 5. Warfarin coagulopathy 6. Acute on chronic kidney disease stage III 7. Medication noncompliance  PLAN: 1.   Mr. Rampey says that he's been compliant with his INRs but may have had difficulty with absorption or interaction with antibiotics despite changing this preemptively his INR could have feasibly below, prompting formation of thrombus on the mitral valve which could explain his multi-territory embolic infarcts in the brain. Going forward, will plan to increase his INR goal to 3-3.5 and continue twice-daily Lovenox until where securely in this range. Unfortunately other options including no acts would be off label use given no indication for his mitral valve prosthesis. We'll likely need to schedule more frequent INR checks at least every 2 weeks.  Follow-up with me 3 months.  Pixie Casino, MD, [facility] Attending Cardiologist [place] 12/08/2016, 8:28 AM

## 2016-12-08 NOTE — Telephone Encounter (Signed)
Left a VM for the patient to call back and schedule a 3 month appointment with Dr. Debara Pickett.

## 2016-12-11 ENCOUNTER — Ambulatory Visit (INDEPENDENT_AMBULATORY_CARE_PROVIDER_SITE_OTHER): Payer: BC Managed Care – PPO | Admitting: Pharmacist

## 2016-12-11 DIAGNOSIS — Z7901 Long term (current) use of anticoagulants: Secondary | ICD-10-CM

## 2016-12-11 DIAGNOSIS — Z952 Presence of prosthetic heart valve: Secondary | ICD-10-CM

## 2016-12-11 LAB — POCT INR: INR: 2.3

## 2016-12-19 ENCOUNTER — Ambulatory Visit (INDEPENDENT_AMBULATORY_CARE_PROVIDER_SITE_OTHER): Payer: BC Managed Care – PPO | Admitting: Pharmacist Clinician (PhC)/ Clinical Pharmacy Specialist

## 2016-12-19 DIAGNOSIS — Z952 Presence of prosthetic heart valve: Secondary | ICD-10-CM | POA: Diagnosis not present

## 2016-12-19 DIAGNOSIS — Z7901 Long term (current) use of anticoagulants: Secondary | ICD-10-CM

## 2016-12-19 LAB — POCT INR: INR: 2.6

## 2016-12-26 ENCOUNTER — Ambulatory Visit (INDEPENDENT_AMBULATORY_CARE_PROVIDER_SITE_OTHER): Payer: BC Managed Care – PPO | Admitting: Pharmacist

## 2016-12-26 DIAGNOSIS — Z7901 Long term (current) use of anticoagulants: Secondary | ICD-10-CM | POA: Diagnosis not present

## 2016-12-26 DIAGNOSIS — Z952 Presence of prosthetic heart valve: Secondary | ICD-10-CM | POA: Diagnosis not present

## 2016-12-26 LAB — POCT INR: INR: 3.5

## 2017-01-02 ENCOUNTER — Ambulatory Visit: Payer: BC Managed Care – PPO | Attending: Neurology | Admitting: Physical Therapy

## 2017-01-02 ENCOUNTER — Encounter: Payer: Self-pay | Admitting: Occupational Therapy

## 2017-01-02 ENCOUNTER — Ambulatory Visit: Payer: BC Managed Care – PPO | Admitting: Occupational Therapy

## 2017-01-02 DIAGNOSIS — R2689 Other abnormalities of gait and mobility: Secondary | ICD-10-CM | POA: Diagnosis present

## 2017-01-02 DIAGNOSIS — R2681 Unsteadiness on feet: Secondary | ICD-10-CM | POA: Insufficient documentation

## 2017-01-02 DIAGNOSIS — M6281 Muscle weakness (generalized): Secondary | ICD-10-CM | POA: Diagnosis present

## 2017-01-02 NOTE — Therapy (Signed)
[facility] [address] [place], [place], [address] Phone: [phone]   Fax:  [phone]  Occupational Therapy Evaluation  Patient Details  Name: Robert [name] MRN: [id number] Date of Birth: May 04, 1940 Referring Provider: Dr. Erlinda Hong  Encounter Date: 01/02/2017      OT End of Session - 01/02/17 1351    Visit Number 1   Number of Visits 1   Date for OT Re-Evaluation --  n/a   Authorization Type BCBS managed care   OT Start Time 1325  pt arrived late   OT Stop Time 1350   OT Time Calculation (min) 25 min   Activity Tolerance Patient tolerated treatment well      Past Medical History:  Diagnosis Date  . BPH (benign prostatic hyperplasia)   . Chronic anticoagulation    coumadin for mechanical valves  . CKD (chronic kidney disease), stage III ([place])   . Diabetes mellitus type 2, diet-controlled ([place])   . Gastric cancer ([place]) 2005   s/p gastrectomy  . High cholesterol   . HOH (hard of hearing)   . Hypertension   . Rheumatic heart disease   . S/P MVR (mitral valve replacement) 2015   86mm StJude mechanical valve    Past Surgical History:  Procedure Laterality Date  . CARDIAC CATHETERIZATION N/A 05/10/2015   Procedure: Right/Left Heart Cath and Coronary Angiography;  Surgeon: Burnell Blanks, MD;  Location: [facility];  Service: Cardiovascular;  Laterality: N/A;  . HERNIA REPAIR    . IR ANGIO INTRA EXTRACRAN SEL COM CAROTID INNOMINATE BILAT MOD SED  12/01/2016  . IR ANGIO VERTEBRAL SEL SUBCLAVIAN INNOMINATE UNI R MOD SED  12/01/2016  . MITRAL VALVE REPLACEMENT  10/24/2013   33mm StJude mechanical valve  . PARTIAL GASTRECTOMY  2005    There were no vitals filed for this visit.      Subjective Assessment - 01/02/17 1329    Patient is accompained by: Family member  wife   Pertinent History Pt with L MCA, R MCA,/PCA, R MCA/ACA infarcts   Patient Stated Goals I want my balance to be better  - I was workng on that before my stroke.   Currently in Pain? No/denies           [facility] OT Assessment - 01/02/17 0001      Assessment   Diagnosis Multi infarcts   Referring Provider Dr. Erlinda Hong   Onset Date 12/01/16   Prior Therapy acute only     Precautions   Precautions Fall     Restrictions   Weight Bearing Restrictions No     Balance Screen   Has the patient fallen in the past 6 months No     Home  Environment   Family/patient expects to be discharged to: Private residence   Living Arrangements Spouse/significant other  2 grandsons - 6 and 13   Available Help at Discharge Available 24 hours/day   Type of [place] Two level   Bathroom Shower/Tub Haematologist   Additional Comments Pt has no equipment in bathroom     Prior Function   Level of Independence Independent   Vocation Full time employment   Vocation 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[employer], news, 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     ADL   Eating/Feeding Independent   Grooming Independent   Upper Body Bathing Independent   Lower Body Bathing Independent  Upper Body Dressing Independent   Lower Body Dressing Independent   Toilet Transfer Independent   [place] Independent     IADL   Shopping Shops independently for small purchases   Light Housekeeping Does not participate in any housekeeping tasks  wife does and did before   Meal Prep --  n/a wife does and did before   Programmer, applications own vehicle   Medication Management Is responsible for taking medication in correct dosages at correct time   Physiological scientist financial matters independently (budgets, writes checks, pays rent, bills goes to bank), collects and keeps track of income     Mobility   Mobility Status Independent     Written Expression   Dominant Hand Right     Vision - History   Baseline Vision Wears glasses  all the time   Additional Comments Pt denies any visual changes since the stroke.       Vision Assessment   Eye Alignment Within Functional Limits     Activity Tolerance   Activity Tolerance Endurance does not limit participation in activity  pt has returned to work      Cognition   Overall Cognitive Status Within Functional Limits for tasks assessed     Sensation   Light Touch Appears Intact   Hot/Cold Appears Intact   Proprioception Appears Intact     Coordination   Gross Motor Movements are Fluid and Coordinated Yes   Fine Motor Movements are Fluid and Coordinated Yes   Finger Nose Finger Test WFL's     Tone   Assessment Location Right Upper Extremity;Left Upper Extremity     ROM / Strength   AROM / PROM / Strength AROM;Strength     AROM   Overall AROM  Within functional limits for tasks performed   Overall AROM Comments BUE's, no pain with movement     Strength   Overall Strength Within functional limits for tasks performed   Overall Strength Comments BUE's     Hand Function   Right Hand Gross Grasp Functional   Right Hand Grip (lbs) 50   Left Hand Gross Grasp Functional   Left Hand Grip (lbs) 50     RUE Tone   RUE Tone Within Functional Limits     LUE Tone   LUE Tone Within Functional Limits                              OT Long Term Goals - 01/02/17 1347      OT LONG TERM GOAL #1   Title --  n/a               Plan - 01/02/17 1347    Clinical Impression Statement Pt is a 76 year old male s/p multi infarcts including L MCA, R MCA/PCA and R MCA/ACA territories admitted to hosptial on 12/01/2016-12/05/2016.  Pt has already returned to work. Pt feels only defict is his balance which he says was problematic before. No other deficits noted and pt does require skilled OT at this time. PT and wife in agreement.    Occupational Profile and client history currently impacting functional performance rheumatic heart disease, CKD, HTN,  HLD, s/p mitral valve replacement, chronic anticoagulation, CAD, CHF   Occupational performance deficits (Please refer to evaluation for details): Other  pt feels his balance "isn't what it used to be"  OT Frequency --  n/a   Plan no follow up OT indicated   Consulted and Agree with Plan of Care Patient;Family member/caregiver   Family Member Consulted wife      Patient will benefit from skilled therapeutic intervention in order to improve the following deficits and impairments:   (n/a)  Visit Diagnosis: Unsteadiness on feet      G-Codes - 28-Jan-2017 1352    Functional Limitation Self care   Self Care Current Status (Q9476) At least 1 percent but less than 20 percent impaired, limited or restricted   Self Care Goal Status (L4650) At least 1 percent but less than 20 percent impaired, limited or restricted   Self Care Discharge Status [phone]) At least 1 percent but less than 20 percent impaired, limited or restricted      Problem List Patient Active Problem List   Diagnosis Date Noted  . Cerebral embolism with cerebral infarction 12/01/2016  . Acute ischemic stroke ([place]) 12/01/2016  . Acute systolic congestive heart failure, NYHA class 3 ([place]) 04/18/2016  . DOE (dyspnea on exertion) 04/07/2016  . Abnormal EKG 10/18/2015  . AKI (acute kidney injury) ([place])   . BPH (benign prostatic hyperplasia) 08/29/2015  . Elevated troponin 08/29/2015  . SBO (small bowel obstruction) ([place]) 08/29/2015  . Pancreatitis 08/29/2015  . Protein-calorie malnutrition, severe ([place]) 08/29/2015  . HLD (hyperlipidemia) 08/29/2015  . Noncompliance with medications 05/26/2015  . Long term (current) use of anticoagulants 05/18/2015  . Coronary artery disease involving native coronary artery of native heart without angina pectoris   . Chronic systolic (congestive) heart failure ([place]) 05/06/2015  . Hypertensive urgency 05/06/2015  . CKD (chronic kidney disease) stage 3, GFR 30-59 ml/min ([facility]) 05/06/2015  .  Chronic anemia 05/06/2015  . H/O mitral valve replacement with mechanical valve 05/06/2015  . CHF (congestive heart failure) ([place]) 05/06/2015    Quay Burow, OTR/L January 28, 2017, 1:53 PM  [facility] [address] [place], [place], [address] Phone: [phone]   Fax:  [phone]  Name: NICOLAOS MITRANO MRN: [id number] Date of Birth: [birth year]

## 2017-01-03 DIAGNOSIS — R2689 Other abnormalities of gait and mobility: Secondary | ICD-10-CM | POA: Diagnosis not present

## 2017-01-03 NOTE — Therapy (Signed)
[facility] [address] [place], [place], [address] Phone: [phone]   Fax:  [phone]  Physical Therapy Evaluation  Patient Details  Name: Robert [name] MRN: [id number] Date of Birth: December 28, 1940 Referring Provider: Rosalin Hawking  Encounter Date: 01/02/2017      PT End of Session - 01/03/17 1230    Visit Number 1   Number of Visits 9   Date for PT Re-Evaluation 03/04/17   Authorization Type BCBS/Medicare   PT Start Time 1402   PT Stop Time 1445   PT Time Calculation (min) 43 min   Activity Tolerance Patient tolerated treatment well   Behavior During Therapy [facility] for tasks assessed/performed      Past Medical History:  Diagnosis Date  . BPH (benign prostatic hyperplasia)   . Chronic anticoagulation    coumadin for mechanical valves  . CKD (chronic kidney disease), stage III ([place])   . Diabetes mellitus type 2, diet-controlled ([place])   . Gastric cancer ([place]) 2005   s/p gastrectomy  . High cholesterol   . HOH (hard of hearing)   . Hypertension   . Rheumatic heart disease   . S/P MVR (mitral valve replacement) 2015   53mm StJude mechanical valve    Past Surgical History:  Procedure Laterality Date  . CARDIAC CATHETERIZATION N/A 05/10/2015   Procedure: Right/Left Heart Cath and Coronary Angiography;  Surgeon: Burnell Blanks, MD;  Location: [facility];  Service: Cardiovascular;  Laterality: N/A;  . HERNIA REPAIR    . IR ANGIO INTRA EXTRACRAN SEL COM CAROTID INNOMINATE BILAT MOD SED  12/01/2016  . IR ANGIO VERTEBRAL SEL SUBCLAVIAN INNOMINATE UNI R MOD SED  12/01/2016  . MITRAL VALVE REPLACEMENT  10/24/2013   26mm StJude mechanical valve  . PARTIAL GASTRECTOMY  2005    There were no vitals filed for this visit.       Subjective Assessment - 01/02/17 1403    Subjective Pt feels with CVA, he noted changes in speech and R sided weakness.  He is back to work; he feels same as prior to CVA.   He feels his balance has been gradually worsening over the past 3 years.  No falls reported.   Patient is accompained by: Family member  wife   Patient Stated Goals Pt's goals for physical therapy are to improve balance problem.   Currently in Pain? 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  No   Is the patient reluctant to leave their home because of a fear of falling?  No     Home Ecologist residence   Living Arrangements Spouse/significant other   Available Help at Discharge Family   Type of [place] to enter   Entrance Stairs-Number of Steps 4   Entrance Stairs-Rails Left   [place] Two level   [place] None     Prior Function   Level of [place] Full  time employment   Vocation 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[employer]; does breathing exercises daily  yoga     Observation/Other Assessments   Focus on Therapeutic Outcomes (FOTO)  Functional Intake status score:  64; Neuro QOL 50.5     ROM / Strength   AROM / PROM / Strength AROM;Strength     AROM   Overall AROM  Within functional limits for tasks performed   Overall AROM Comments bilateral lower extremities     Strength   Overall Strength Deficits   Overall Strength Comments Grossly tested hip flexion 5/5 bilatera, quads and hamstrings 5/5, R ankle dorsiflexion 4/5, L ankle dorsiflexion 5/5     Transfers   Transfers Sit to Stand;Stand to Sit   Sit to Stand 6: Modified independent (Device/Increase time);With upper extremity assist;From chair/3-in-1    Stand to Sit 6: Modified independent (Device/Increase time);With upper extremity assist;To chair/3-in-1     Ambulation/Gait   Ambulation/Gait Yes   Ambulation/Gait Assistance 5: Supervision   Ambulation Distance (Feet) 200 Feet   Assistive device None   Gait Pattern Decreased arm swing - right;Decreased arm swing - left;Step-through pattern;Narrow base of support   Gait velocity 2.69 ft/sec     Balance   Balance Assessed Yes     Static Standing Balance   Static Standing - Balance Support No upper extremity supported   Static Standing - Level of Assistance 5: Stand by assistance   Static Standing Balance -  Activities  Single Leg Stance - Right Leg;Single Leg Stance - Left Leg;Tandam Stance - Right Leg;Tandam Stance - Left Leg  SLS R 4.19, L, 3.19; tandem R 24.12, L 30 sec     High Level Balance   High Level Balance Comments Pt stands EO and EC solid surface x 30 seconds; EO, EC on foam x 30 seconds, with increased sway EC on foam     Standardized Balance Assessment   Standardized Balance Assessment Timed Up and Go Test     Timed Up and Go Test   Normal TUG (seconds) 13.43   TUG Comments Scores > 13.5 seconds indicate increased fall risk     Functional Gait  Assessment   Gait assessed  Yes   Gait Level Surface Walks 20 ft, slow speed, abnormal gait pattern, evidence for imbalance or deviates 10-15 in outside of the 12 in walkway width. Requires more than 7 sec to ambulate 20 ft.  7.42   Change in Gait Speed Able to smoothly change walking speed without loss of balance or gait deviation. Deviate no more than 6 in outside of the 12 in walkway width.   Gait with Horizontal Head Turns Performs head turns smoothly with slight change in gait velocity (eg, minor disruption to smooth gait path), deviates 6-10 in outside 12 in walkway width, or uses an assistive device.   Gait with Vertical Head Turns Performs task with slight change in gait velocity (eg, minor disruption to smooth gait  path), deviates 6 - 10 in outside 12 in walkway width or uses assistive device   Gait and Pivot Turn Pivot turns safely in greater than 3 sec and stops with no loss of balance, or pivot turns safely within 3 sec and stops with mild imbalance, requires small steps to catch balance.   Step Over Obstacle Is able to step over one shoe box (4.5 in total height) without changing gait speed. No evidence of imbalance.   Gait with Narrow Base of  Support Ambulates 4-7 steps.   Gait with Eyes Closed Walks 20 ft, slow speed, abnormal gait pattern, evidence for imbalance, deviates 10-15 in outside 12 in walkway width. Requires more than 9 sec to ambulate 20 ft.  16.88   Ambulating Backwards Walks 20 ft, slow speed, abnormal gait pattern, evidence for imbalance, deviates 10-15 in outside 12 in walkway width.  29.33 sec   Steps Alternating feet, no rail.   Total Score 18   FGA comment: Scores <22/30 indicate increased fall risk            Objective measurements completed on examination: See above findings.                       PT Long Term Goals - 01/03/17 1237      PT LONG TERM GOAL #1   Title Pt will perform HEP for improved balance and gait.  TARGET 02/02/17   Time 4   Period Weeks   Status New   Target Date 02/02/17     PT LONG TERM GOAL #2   Title Pt will improve Functional Gait Assessment to at least 22/30 for decreased fall risk.   Time 4   Period Weeks   Status New   Target Date 02/02/17     PT LONG TERM GOAL #3   Title Sensory Organization test to be performed, with goal to be written as appropriate.   Time 4   Period Weeks   Status New   Target Date 02/02/17     PT LONG TERM GOAL #4   Title Pt will verbalize understanding of fall prevention in home environment.   Time 4   Period Weeks   Status New   Target Date 02/02/17     PT LONG TERM GOAL #5   Title Pt will verbalize understanding of CVA education.   Time 4   Period Weeks   Status New   Target  Date 02/02/17                Plan - 01/03/17 1231    Clinical Impression Statement Pt is a 76 year old male who presents to OP PT s/p cerebral infarction, L MCA (due to embolism).  He initially had R sided weakness and speech difficulty, but pt reports this is resolved.  He notes progressive balance changes over the past several years and is concerned about balance.  He has had no falls.  Pt presents with decreased balance, decreased safety with dynamic gait activities, possible decreased vestibular system use for balance, decreased functional strength.  Pt would benefit from skilled PT to address the above stated deficits and to decrease fall risk.  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   History and Personal Factors relevant to plan of care: PMH >3 co-morbidities   Clinical Presentation Stable   Clinical Presentation due to: fall risk per FGA, hx of CVA   Clinical Decision Making Low   Rehab Potential Good   Clinical Impairments Affecting Rehab Potential motivated to improve balance, pt is still working/standing for long periods as professor   PT Frequency 2x / week   PT Duration 4 weeks  plus eval   PT Treatment/Interventions ADLs/Self Care Home Management;Gait training;Neuromuscular re-education;Balance training;Therapeutic exercise;Therapeutic activities;Functional mobility training;Patient/family education   PT Next Visit Plan Perform Sensory Organization test and write goal as appropriate; initiate HEP based  on SOT results-likely compliant surfaces with head movements/EO and EC   Consulted and Agree with Plan of Care Patient;Family member/caregiver   Family Member Consulted wife      Patient will benefit from skilled therapeutic intervention in order to improve the following deficits and impairments:  Abnormal gait, Decreased balance, Decreased mobility, Difficulty walking,  Decreased strength  Visit Diagnosis: Other abnormalities of gait and mobility  Unsteadiness on feet  Muscle weakness (generalized)      G-Codes - 2017/01/11 1240    Functional Assessment Tool Used (Outpatient Only) Functional Gait assessment 18/30; SLS 3-4 seconds; excess sway standing on foam with EC; FOTO intake 64; Neuro QOL 50.5   Functional Limitation Mobility: Walking and moving around   Mobility: Walking and Moving Around Current Status [phone]) At least 20 percent but less than 40 percent impaired, limited or restricted   Mobility: Walking and Moving Around Goal Status [phone]) At least 1 percent but less than 20 percent impaired, limited or restricted       Problem List Patient Active Problem List   Diagnosis Date Noted  . Cerebral embolism with cerebral infarction 12/01/2016  . Acute ischemic stroke ([place]) 12/01/2016  . Acute systolic congestive heart failure, NYHA class 3 ([place]) 04/18/2016  . DOE (dyspnea on exertion) 04/07/2016  . Abnormal EKG 10/18/2015  . AKI (acute kidney injury) ([place])   . BPH (benign prostatic hyperplasia) 08/29/2015  . Elevated troponin 08/29/2015  . SBO (small bowel obstruction) ([place]) 08/29/2015  . Pancreatitis 08/29/2015  . Protein-calorie malnutrition, severe ([place]) 08/29/2015  . HLD (hyperlipidemia) 08/29/2015  . Noncompliance with medications 05/26/2015  . Long term (current) use of anticoagulants 05/18/2015  . Coronary artery disease involving native coronary artery of native heart without angina pectoris   . Chronic systolic (congestive) heart failure ([place]) 05/06/2015  . Hypertensive urgency 05/06/2015  . CKD (chronic kidney disease) stage 3, GFR 30-59 ml/min ([facility]) 05/06/2015  . Chronic anemia 05/06/2015  . H/O mitral valve replacement with mechanical valve 05/06/2015  . CHF (congestive heart failure) ([place]) 05/06/2015    Prudy Candy W. January 11, 2017, 12:41 PM  Mady Haagensen, PT 01-11-17 12:42 PM Phone: [phone] Fax:  [facility] [address] [place], [place], [address] Phone: [phone]   Fax:  [phone]  Name: SRIHARI SHELLHAMMER MRN: [id number] Date of Birth: 1940-06-19

## 2017-01-09 ENCOUNTER — Ambulatory Visit: Payer: BC Managed Care – PPO | Admitting: Physical Therapy

## 2017-01-09 ENCOUNTER — Ambulatory Visit (INDEPENDENT_AMBULATORY_CARE_PROVIDER_SITE_OTHER): Payer: BC Managed Care – PPO | Admitting: Pharmacist

## 2017-01-09 DIAGNOSIS — Z952 Presence of prosthetic heart valve: Secondary | ICD-10-CM | POA: Diagnosis not present

## 2017-01-09 DIAGNOSIS — Z7901 Long term (current) use of anticoagulants: Secondary | ICD-10-CM

## 2017-01-09 LAB — POCT INR: INR: 3.1

## 2017-01-16 ENCOUNTER — Ambulatory Visit: Payer: BC Managed Care – PPO | Admitting: Physical Therapy

## 2017-01-18 ENCOUNTER — Ambulatory Visit: Payer: BC Managed Care – PPO | Admitting: Physical Therapy

## 2017-01-23 ENCOUNTER — Ambulatory Visit: Payer: BC Managed Care – PPO | Admitting: Physical Therapy

## 2017-01-25 ENCOUNTER — Ambulatory Visit: Payer: BC Managed Care – PPO | Admitting: Physical Therapy

## 2017-01-30 ENCOUNTER — Ambulatory Visit (INDEPENDENT_AMBULATORY_CARE_PROVIDER_SITE_OTHER): Payer: BC Managed Care – PPO | Admitting: Pharmacist Clinician (PhC)/ Clinical Pharmacy Specialist

## 2017-01-30 DIAGNOSIS — Z7901 Long term (current) use of anticoagulants: Secondary | ICD-10-CM

## 2017-01-30 DIAGNOSIS — Z952 Presence of prosthetic heart valve: Secondary | ICD-10-CM | POA: Diagnosis not present

## 2017-01-30 LAB — POCT INR: INR: 2.6

## 2017-02-06 ENCOUNTER — Ambulatory Visit: Payer: BC Managed Care – PPO | Admitting: Physical Therapy

## 2017-02-06 ENCOUNTER — Ambulatory Visit: Payer: Self-pay | Admitting: Neurology

## 2017-02-06 ENCOUNTER — Encounter: Payer: Self-pay | Admitting: Neurology

## 2017-02-06 ENCOUNTER — Ambulatory Visit (INDEPENDENT_AMBULATORY_CARE_PROVIDER_SITE_OTHER): Payer: BC Managed Care – PPO | Admitting: Neurology

## 2017-02-06 VITALS — BP 172/70 | HR 52 | Ht 66.0 in | Wt 129.8 lb

## 2017-02-06 DIAGNOSIS — Z7901 Long term (current) use of anticoagulants: Secondary | ICD-10-CM | POA: Diagnosis not present

## 2017-02-06 DIAGNOSIS — E785 Hyperlipidemia, unspecified: Secondary | ICD-10-CM | POA: Diagnosis not present

## 2017-02-06 DIAGNOSIS — I639 Cerebral infarction, unspecified: Secondary | ICD-10-CM

## 2017-02-06 DIAGNOSIS — I5022 Chronic systolic (congestive) heart failure: Secondary | ICD-10-CM | POA: Diagnosis not present

## 2017-02-06 DIAGNOSIS — Z952 Presence of prosthetic heart valve: Secondary | ICD-10-CM | POA: Diagnosis not present

## 2017-02-06 DIAGNOSIS — N183 Chronic kidney disease, stage 3 unspecified: Secondary | ICD-10-CM

## 2017-02-06 DIAGNOSIS — I10 Essential (primary) hypertension: Secondary | ICD-10-CM | POA: Diagnosis not present

## 2017-02-06 DIAGNOSIS — I63412 Cerebral infarction due to embolism of left middle cerebral artery: Secondary | ICD-10-CM

## 2017-02-06 NOTE — Progress Notes (Signed)
STROKE NEUROLOGY FOLLOW UP NOTE  NAME: Robert [name] DOB: 08-13-1940  REASON FOR VISIT: stroke follow up HISTORY FROM: pt and wife and chart  Today we had the pleasure of seeing Robert [name] in follow-up at our Neurology Clinic. Pt was accompanied by wife.   History Summary [name] a 76 y.o.malewith history of rheumatic heart disease with mitral valve replacement (on Coumadin), hypertension, hyperlipidemia, diabetes mellitus, chronic kidney disease admitted on 12/01/16 for right-sided weakness and expressive aphasia.INR subtherapeutic at 2.31, likely due to Coumadin interaction with recent antibiotic use.  CT head and neck showed subocclusive distal left M2/M3 thrombus with associated delayed effusion within the posterior left MCA territory without cord infarct.  DSA showed occluded distal parietal M3 branch of the inferior division of left MCA.  No procedure performed.  MRI showed multifocal small patchy punctate infarcts including left MCA, right MCA/PCA, right MCA/ACA territory.  MRI showed recannulized left M2/M3 occlusion.  Has difficulty remove femoral sheath due to INR above 1.5.  Started IV heparin to bridge Coumadin.  EF 40-45%.  LDL 75 and A1c 6.0.  Once femoral sheath removed, heparin IV change to Lovenox bridge to Coumadin on discharge home.  INR goal 2.5-3.5.  Lipitor increased from 20 to 40 on discharge.  Creatinine stable, right-sided weakness resolved and aphasia improved.  Interval History During the interval time, the patient has been doing well.  Now back to baseline.  Follow with cardiology shortly after discharge, continued Lovenox bridge to Coumadin and recommended INR goal 3.0-3.5.  He also follow with his PCP after discharge.  No PT/OT therapy needed.  However, patient BP monitoring at home showed high BP around 165/80-90.  Today in clinic BP 172/70.  INR monitoring stable, last checked at 2.6.  REVIEW OF SYSTEMS: Full 14 system review of systems  performed and notable only for those listed below and in HPI above, all others are negative:  Constitutional:   Cardiovascular:  Ear/Nose/Throat:   Skin:  Eyes:   Respiratory:   Gastroitestinal:   Genitourinary:  Hematology/Lymphatic:   Endocrine:  Musculoskeletal: Joint pain, joint swelling, back pain, aching muscles Allergy/Immunology:   Neurological:   Psychiatric:  Sleep:   The following represents the patient's updated allergies and side effects list: Allergies  Allergen Reactions  . Penicillins Nausea And Vomiting    Has patient had a PCN reaction causing immediate rash, facial/tongue/throat swelling, SOB or lightheadedness with hypotension: No Has patient had a PCN reaction causing severe rash involving mucus membranes or skin necrosis: No Has patient had a PCN reaction that required hospitalization: Unknown Has patient had a PCN reaction occurring within the last 10 years: No If all of the above answers are "NO", then may proceed with Cephalosporin use.  . Vancomycin Other (See Comments)    Kidney problems    The neurologically relevant items on the patient's problem list were reviewed on today's visit.  Neurologic Examination  A problem focused neurological exam (12 or more points of the single system neurologic examination, vital signs counts as 1 point, cranial nerves count for 8 points) was performed.  Blood pressure (!) 172/70, pulse (!) 52, height 5\' 6"  (1.676 m), weight 129 lb 12.8 oz (58.9 kg).  General - Well nourished, well developed, in no apparent distress.  Ophthalmologic - Sharp disc margins OU.   Cardiovascular - Regular rate and rhythm.  Mental Status -  Level of arousal and orientation to time, place, and person were intact. Language including expression, naming,  repetition, comprehension was assessed and found intact.  Mild dysarthria may be due to [place] accent.  Cranial Nerves II - XII - II - Visual field intact OU. III, IV, VI -  Extraocular movements intact. V - Facial sensation intact bilaterally. VII - Facial movement intact bilaterally. VIII - Hearing & vestibular intact bilaterally. X - Palate elevates symmetrically. XI - Chin turning & shoulder shrug intact bilaterally. XII - Tongue protrusion intact.  Motor Strength - The patient's strength was normal in all extremities and pronator drift was absent.  Bulk was normal and fasciculations were absent.   Motor Tone - Muscle tone was assessed at the neck and appendages and was normal.  Reflexes - The patient's reflexes were 1+ in all extremities and he had no pathological reflexes.  Sensory - Light touch, temperature/pinprick were assessed and were normal.    Coordination - The patient had normal movements in the hands and feet with no ataxia or dysmetria.  Tremor was absent.  Gait and Station - mild stooped posturing, slow gait.   Functional score  mRS = 0   0 - No symptoms.   1 - No significant disability. Able to carry out all usual activities, despite some symptoms.   2 - Slight disability. Able to look after own affairs without assistance, but unable to carry out all previous activities.   3 - Moderate disability. Requires some help, but able to walk unassisted.   4 - Moderately severe disability. Unable to attend to own bodily needs without assistance, and unable to walk unassisted.   5 - Severe disability. Requires constant nursing care and attention, bedridden, incontinent.   6 - Dead.   NIH Stroke Scale = 0  Data reviewed: I personally reviewed the images and agree with the radiology interpretations.  MRI / MRA Brain 12/02/2016 1. Patchy acute infarct in the posterior left insula/operculum corresponding to the left MCA M2 lesion seen yesterday. That lesion does not persist on today intracranial MRA, although the axial FLAIR does suggest some residual slow arterial flow in that region. 2. Additional small scattered punctate acute  infarcts elsewhere in the left MCA territory, but also in the right MCA and right PCA territory. Therefore, consider a recent embolic event from the heart or proximal aorta. 3. No associated hemorrhage or mass effect. 4. Superimposed chronic bilateral cerebellar artery ischemia. 5. No intracranial stenosis or circle of Willis branch occlusion on today's intracranial MRA.  Ct Angio Head W Or Wo Contrast Ct Angio Neck W Or Wo Contrast Ct Cerebral Perfusion W Contrast 12/01/2016 IMPRESSION:  1. Subocclusive distal left M2/M3 thrombus with associated delayed perfusion within the posterior left MCA territory without core infarct.As discussed above, thrombus is favored to be sub occlusive in nature, although collateral flowmay be contributing to distal MCA branch opacification seen beyond the defect.  2. Otherwise negative CTA without large or proximal arterial branch occlusion. No other high-grade or correctable stenosis.  3. Mild aortic arch, common carotid, carotid siphon calcifications without flow-limiting stenosis.   Ct Head Code Stroke Wo Contrast 12/01/2016 IMPRESSION:  1. No acute cortically based infarct or acute intracranial hemorrhage identified.  2. ASPECTS is 10.  3. 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   IR - Cerebral Angiogram 12/01/2016 Occluded distal parietal M3 branch of inferior division of Lt MCA.  Transthoracic Echocardiogram - Left ventricle: The cavity size was normal. There was moderate concentric hypertrophy with severe hypertrophy of the mid inferior myocardium (2.3cm). Systolic  function was mildly to moderately reduced. The estimated ejection fraction was in the range of 40% to 45%. Wall motion was normal; there were no regional wall motion abnormalities. The study is not technically sufficient to allow evaluation of LV diastolic function. - Aortic valve: Transvalvular velocity was within the normal  range. There was no stenosis. There was mild regurgitation. - Mitral valve: A bileaflet mechanical prosthesis was present and functioning normally. Transvalvular velocity was within the normal range. There was no evidence for stenosis. There was no regurgitation. - Right ventricle: The cavity size was normal. Wall thickness was normal. Systolic function was normal. - Atrial septum: No defect or patent foramen ovale was identified. - Tricuspid valve: There was moderate regurgitation. - Pulmonic valve: There was moderate regurgitation. - Pulmonary arteries: Systolic pressure was moderately to severely increased. PA peak pressure: 58 mm Hg (S).  Component     Latest Ref Rng & Units 12/02/2016  Cholesterol     0 - 200 mg/dL 125  Triglycerides     <150 mg/dL 27  HDL Cholesterol     >40 mg/dL 45  Total CHOL/HDL Ratio     RATIO 2.8  VLDL     0 - 40 mg/dL 5  LDL (calc)     0 - 99 mg/dL 75  Hemoglobin A1C     4.8 - 5.6 % 6.0 (H)  Mean Plasma Glucose     mg/dL 125.5    Assessment: As you may recall, he is a 76 y.o. Asian male with PMH of rheumatic heart disease with mitral valve replacement (on Coumadin), HTN, HLD, DM, CKD admitted on 12/01/16 for multifocal small patchy punctate infarcts including left MCA, right MCA/PCA, right MCA/ACA territory due toINR subtherapeutic at 2.31.  CT head and neck showed subocclusive distal left M2/M3 thrombus with associated delayed effusion within the posterior left MCA territory without cord infarct.  DSA showed occluded distal parietal M3 branch of the inferior division of left MCA.  No procedure performed.  MRA showed recannulized left M2/M3 occlusion.  EF 40-45%.  LDL 75 and A1c 6.0.  Discharged with Coumadin with Lovenox bridge. Lipitor increased from 20 to 40 on discharge.  Now back to baseline.  Follow with cardiology shortly after discharge, and recommended INR goal 3.0-3.5.  However, patient BP high lately.   Plan:  - continue  coumadin and ASA 81mg  for stroke prevention and for mechanical valve - continue lipitor for stroke prevention - continue follow up with Cardiology - Follow up with your primary care physician for stroke risk factor modification. Recommend maintain blood pressure goal <130/80, diabetes with hemoglobin A1c goal below 7.0% and lipids with LDL cholesterol goal below 70 mg/dL.  - check BP at home and record. Call PCP for medication adjustment if needed.  - healthy diet and regular exercise - follow up in 4 months  I spent more than 25 minutes of face to face time with the patient. Greater than 50% of time was spent in counseling and coordination of care. We discussed BP monitoring and management, INR monitoring, and bleeding precautions.  No orders of the defined types were placed in this encounter.   No orders of the defined types were placed in this encounter.   Patient Instructions  - continue coumadin and ASA 81mg  for stroke prevention and for mechanical valve - continue lipitor for stroke prevention - continue follow up with Cardiology - Follow up with your primary care physician for stroke risk factor modification. Recommend maintain blood pressure  goal <130/80, diabetes with hemoglobin A1c goal below 7.0% and lipids with LDL cholesterol goal below 70 mg/dL.  - check BP at home and record. Call PCP for medication adjustment if needed.  - healthy diet and regular exercise - follow up in 4 months   Rosalin Hawking, MD PhD [facility] [address], [place], [place] [address] [phone]

## 2017-02-06 NOTE — Patient Instructions (Signed)
-   continue coumadin and ASA 81mg  for stroke prevention and for mechanical valve - continue lipitor for stroke prevention - continue follow up with Cardiology - Follow up with your primary care physician for stroke risk factor modification. Recommend maintain blood pressure goal <130/80, diabetes with hemoglobin A1c goal below 7.0% and lipids with LDL cholesterol goal below 70 mg/dL.  - check BP at home and record. Call PCP for medication adjustment if needed.  - healthy diet and regular exercise - follow up in 4 months

## 2017-02-08 ENCOUNTER — Ambulatory Visit: Payer: BC Managed Care – PPO | Admitting: Physical Therapy

## 2017-02-13 ENCOUNTER — Ambulatory Visit: Payer: BC Managed Care – PPO | Admitting: Physical Therapy

## 2017-02-15 ENCOUNTER — Ambulatory Visit: Payer: BC Managed Care – PPO | Admitting: Physical Therapy

## 2017-02-20 ENCOUNTER — Ambulatory Visit: Payer: BC Managed Care – PPO | Admitting: Internal Medicine

## 2017-02-23 ENCOUNTER — Telehealth: Payer: Self-pay | Admitting: Pharmacist Clinician (PhC)/ Clinical Pharmacy Specialist

## 2017-02-23 ENCOUNTER — Ambulatory Visit (INDEPENDENT_AMBULATORY_CARE_PROVIDER_SITE_OTHER): Payer: BC Managed Care – PPO | Admitting: Pharmacist Clinician (PhC)/ Clinical Pharmacy Specialist

## 2017-02-23 DIAGNOSIS — Z952 Presence of prosthetic heart valve: Secondary | ICD-10-CM

## 2017-02-23 DIAGNOSIS — Z7901 Long term (current) use of anticoagulants: Secondary | ICD-10-CM | POA: Diagnosis not present

## 2017-02-23 DIAGNOSIS — I63412 Cerebral infarction due to embolism of left middle cerebral artery: Secondary | ICD-10-CM

## 2017-02-23 LAB — POCT INR: INR: 3.2

## 2017-02-23 MED ORDER — CARVEDILOL 12.5 MG PO TABS
12.5000 mg | ORAL_TABLET | Freq: Two times a day (BID) | ORAL | 3 refills | Status: DC
Start: 2017-02-23 — End: 2017-03-14

## 2017-02-23 NOTE — Patient Instructions (Addendum)
Description   Continue with 1.5 tablets daily except 1 tablet each Sunday, Tuesday and Thursday.  Repeat INR in 3 weeks      Increase carvedilol to 12.5 mg ( 2 x 6.25 mg tablets) twice daily.   Bring your home BP readings and cuff when you see Dr. Debara Pickett on January 2.    HOW TO TAKE YOUR BLOOD PRESSURE: . Rest 5 minutes before taking your blood pressure. .  Don't smoke or drink caffeinated beverages for at least 30 minutes before. . Take your blood pressure before (not after) you eat. . Sit comfortably with your back supported and both feet on the floor (don't cross your legs). . Elevate your arm to heart level on a table or a desk. . Use the proper sized cuff. It should fit smoothly and snugly around your bare upper arm. There should be enough room to slip a fingertip under the cuff. The bottom edge of the cuff should be 1 inch above the crease of the elbow. . Ideally, take 3 measurements at one sitting and record the average.

## 2017-02-23 NOTE — Telephone Encounter (Signed)
BP 172/86  HR 72

## 2017-03-14 ENCOUNTER — Ambulatory Visit (INDEPENDENT_AMBULATORY_CARE_PROVIDER_SITE_OTHER): Payer: BC Managed Care – PPO | Admitting: Pharmacist

## 2017-03-14 ENCOUNTER — Encounter: Payer: Self-pay | Admitting: Internal Medicine

## 2017-03-14 ENCOUNTER — Ambulatory Visit: Payer: BC Managed Care – PPO | Admitting: Internal Medicine

## 2017-03-14 VITALS — BP 162/81 | HR 62 | Ht 66.0 in | Wt 131.4 lb

## 2017-03-14 DIAGNOSIS — Z952 Presence of prosthetic heart valve: Secondary | ICD-10-CM | POA: Diagnosis not present

## 2017-03-14 DIAGNOSIS — Z7901 Long term (current) use of anticoagulants: Secondary | ICD-10-CM | POA: Diagnosis not present

## 2017-03-14 DIAGNOSIS — I5022 Chronic systolic (congestive) heart failure: Secondary | ICD-10-CM | POA: Diagnosis not present

## 2017-03-14 DIAGNOSIS — I10 Essential (primary) hypertension: Secondary | ICD-10-CM

## 2017-03-14 LAB — POCT INR: INR: 4.1

## 2017-03-14 MED ORDER — CARVEDILOL 25 MG PO TABS: 25.0000 mg | ORAL_TABLET | Freq: Two times a day (BID) | ORAL | 5 refills | Status: DC

## 2017-03-14 NOTE — Patient Instructions (Signed)
Vitamin K Foods and Warfarin Warfarin is a blood thinner (anticoagulant). Anticoagulant medicines help prevent the formation of blood clots. These medicines work by decreasing the activity of vitamin K, which promotes normal blood clotting. When you take warfarin, problems can occur from suddenly increasing or decreasing the amount of vitamin K that you eat from one day to the next. Problems may include:  Blood clots.  Bleeding. What general guidelines do I need to follow? To avoid problems when taking warfarin:  Eat a balanced diet that includes:  Fresh fruits and vegetables.  Whole grains.  Low-fat dairy products.  Lean proteins, such as fish, eggs, and lean cuts of meat.  Keep your intake of vitamin K consistent from day to day. To do this:  Avoid eating large amounts of vitamin K one day and low amounts of vitamin K the next day.  If you take a multivitamin that contains vitamin K, be sure to take it every day.  Know which foods contain vitamin K. Use the lists below to understand serving sizes and the amount of vitamin K in one serving.  Avoid major changes in your diet. If you are going to change your diet, talk with your health care provider before making changes.  Work with a nutrition specialist (dietitian) to develop a meal plan that works best for you. High vitamin K foods Foods that are high in vitamin K contain more than 100 mcg (micrograms) per serving. These include:  Broccoli (cooked) -  cup has 110 mcg.  Brussels sprouts (cooked) -  cup has 109 mcg.  Greens, beet (cooked) -  cup has 350 mcg.  Greens, collard (cooked) -  cup has 418 mcg.  Greens, turnip (cooked) -  cup has 265 mcg.  Green onions or scallions -  cup has 105 mcg.  Kale (fresh or frozen) -  cup has 531 mcg.  Parsley (raw) - 10 sprigs has 164 mcg.  Spinach (cooked) -  cup has 444 mcg.  Swiss chard (cooked) -  cup has 287 mcg. Moderate vitamin K foods Foods that have a  moderate amount of vitamin K contain 25-100 mcg per serving. These include:  Asparagus (cooked) - 5 spears have 38 mcg.  Black-eyed peas (dried) -  cup has 32 mcg.  Cabbage (cooked) -  cup has 37 mcg.  Kiwi fruit - 1 medium has 31 mcg.  Lettuce - 1 cup has 57-63 mcg.  Okra (frozen) -  cup has 44 mcg.  Prunes (dried) - 5 prunes have 25 mcg.  Watercress (raw) - 1 cup has 85 mcg. Low vitamin K foods Foods low in vitamin K contain less than 25 mcg per serving. These include:  Artichoke - 1 medium has 18 mcg.  Avocado - 1 oz. has 6 mcg.  Blueberries -  cup has 14 mcg.  Cabbage (raw) -  cup has 21 mcg.  Carrots (cooked) -  cup has 11 mcg.  Cauliflower (raw) -  cup has 11 mcg.  Cucumber with peel (raw) -  cup has 9 mcg.  Grapes -  cup has 12 mcg.  Mango - 1 medium has 9 mcg.  Nuts - 1 oz. has 15 mcg.  Pear - 1 medium has 8 mcg.  Peas (cooked) -  cup has 19 mcg.  Pickles - 1 spear has 14 mcg.  Pumpkin seeds - 1 oz. has 13 mcg.  Sauerkraut (canned) -  cup has 16 mcg.  Soybeans (cooked) -  cup has 16 mcg.    has 16 mcg.  Tomato (raw) - 1 medium has 10 mcg.  Tomato sauce -  cup has 17 mcg.  Vitamin K-free foods If a food contain less than 5 mcg per serving, it is considered to have no vitamin K. These foods include:  Bread and cereal products.  Cheese.  Eggs.  Fish and shellfish.  Meat and poultry.  Milk and dairy products.  Sunflower seeds.  Actual amounts of vitamin K in foods may be different depending on processing. Talk with your dietitian about what foods you can eat and what foods you should avoid. This information is not intended to replace advice given to you by your health care provider. Make sure you discuss any questions you have with your health care provider. Document Released: 12/25/2008 Document Revised: 09/19/2015 Document Reviewed: 06/02/2015 Elsevier Interactive Patient Education  2018 Elsevier Inc.  

## 2017-03-14 NOTE — Progress Notes (Signed)
OFFICE NOTE  Chief Complaint:  Routine follow-up  Primary Care Physician: Burman Freestone, MD  HPI:  Robert [name] is a 77 y.o. 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 He has been followed by Dr. Geraldo Pitter at [facility] cardiology and was referred to Dr. Evelina Dun at [facility] in 2013 for evaluation of his mitral stenosis. He was followed clinically and underwent mechanical mitral valve replacement in 2015. Since then he's been compliant on warfarin and has had therapeutic to supratherapeutic INRs. LV function was normal prior to surgery however we cannot find echocardiographic results after his surgery. A heart catheterization performed prior to surgery demonstrated a 70% diagonal stenosis and 55% LAD stenosis, but he did not have any bypass surgery. He now presents with one week of progressive shortness of breath, orthopnea and leg edema concerning for heart failure. He seems to be responding to IV Lasix. Chest x-ray shows COPD changes, small bilateral pleural effusions and some basilar atelectasis. Labs indicate markedly elevated BNP of 1406 and troponin is negative 3. There appears to be a mild iron deficiency anemia with H&H of 9 and 29. INR initially was 4.37 that came down to 3.98. He was admitted and treated for heart failure with diuresis. He underwent right and left heart catheterization which demonstrated the following:   Prox RCA lesion, 20% stenosed.  Prox LAD to Mid LAD lesion, 30% stenosed.  Mid LAD lesion, 40% stenosed.  Ost 1st Diag to 1st Diag lesion, 50% stenosed.  Ost 2nd Diag lesion, 30% stenosed.  1. Mild to moderate non-obstructive CAD 2. Normal movement of mechanical mitral valve leaflets.  3. Elevated filling pressures suggesting residual volume overload.   He also had an echocardiogram which showed a newly reduced LVEF of 45-50%.  At discharge he was scheduled to be on Lasix 60 mg daily, and increase from his prior home dose of 40 mg daily. It was also recommended that he discontinue valsartan and spironolactone. After discharge he had routine lab work which demonstrated acute renal failure and creatinine had increased from 1.8-2.9. He was also markedly hyperkalemic at 5.7. This was brought to the attention of the [facility] DOD Dr. Marlou Porch, who recommended hydration and avoiding potassium with a repeat metabolic profile the next day. The repeat metabolic profile showed creatinine had decreased to 2.7 and potassium was 5.3. Since discharge Robert [name] had poor appetite, weight has remained stable and he is felt weak and had poor energy. Much worse than when he was discharged. I spent a good deal of time reviewing the patient's medications with his wife who had a 3 x 5 note card with his previous medications on the front and new medications on the rear. She tells me that he is actually taking all of these medications, therefore he is taking his old dose of Lasix 40 mg in addition to Lasix 60 mg daily as well as spironolactone and valsartan. Certainly explain his acute renal failure as well as hyperkalemia. I reviewed his discharge medication summary which clearly states to discontinue valsartan and Lasix and noted the dose changes of medications. He was seen in his primary care doctor's office after discharge however it appears that these medication changes were not identified. In addition, his INR is markedly elevated today at 6.2. This is up from 3.5 where it was 1 week ago. I suspect again this is related to renal failure, decreased appetite  and nutritional reasons. Medication noncompliance could be playing a role.  Robert [name] returns today for follow-up. Since we sorted out his medications he's done much better. His creatinine has gone back down to baseline of 1.8. He is on iron and B12 and his hemoglobin and hematocrit of come up to 10.7  and 32. He reports marked improvement in his energy. He does say that he has problems with decreased energy and sleep although he feels like he gets a restful night sleep. He denies any snoring or apnea. He wakes up fairly early in the morning and does yoga and often takes a nap in the morning. Unfortunately his INR became subtherapeutic after holding his warfarin for coagulopathy. On the 20th it was 1.4. Today his INR is 1.8.  10/18/2015  Robert [name] returns today for follow-up of 2 recent hospitalizations. He was seen at [facility] and subsequently at [facility] a couple weeks later for small bowel obstruction. He does have a history of gastric cancer and is status post subtotal gastrectomy in 2005. Unfortunately he has not been able to eat and has lost a significant amount of weight. He is currently 119 pounds with a BMI of 19 and does feel somewhat weak. Fortunately, however his LV function has improved back to 55-60% by echo. He was seen in consultation and no further cardiac workup was recommended as he did have some mildly elevated troponins. Systolic to be nonspecific. He's had no problems with his mechanical mitral valve and INR today is finally controlled at 3.5. He tells me he is interested in getting some dental work including having several teeth pulled and having a denture placed at [facility] in the near future.  04/07/2016  Robert [name] presents today with some recent dyspnea on exertion. He says over the past 2 weeks she's become short of breath walking up stairs and now walking on a flat surface. EF had improved recently up to 55-60% by echo, however this was during hospitalization for a small bowel obstruction. At the time he did have elevated troponins but was felt due to a noncardiac etiology. He denies any chest pain however his progressive dyspnea and fatigue with exertion could be due to coronary ischemia. He was noted to have moderate nonobstructive coronary disease by cath more than  a year ago.  04/18/2016  Robert [name] was seen back today in follow-up. He reports some continued shortness of breath and now some lower strandy swelling. He underwent a Lexiscan Myoview which showed no ischemia however EF was back to 45%. He has had 7 pound weight gain since I last saw him consistent with acute systolic congestive heart failure. He is only on Lasix 20 mg every other day.  05/15/2016  Robert [name] returns today for follow-up. Blood pressure is elevated 156/70. He reports that increasing his Lasix has helped with some of his shortness of breath and abdominal swelling. Unfortunately did not get blood work drawn as I had requested. He is due for repeat metabolic profile and BNP. We discussed briefly the addition of Entresto he may be a good candidate for that. I like to evaluate his labs today and make a determination based on his renal function. He also is reported recently a decrease in appetite and early satiety, particularly with breads and other foods that he cannot tolerate. This sounds like more of a GI issue and I've encouraged him to follow-up with his gastroenterologist in [place].  06/06/2016  Robert [name] was seen today in follow-up.  He reports breathing improved significantly as well as the swelling reduced after adding a second dose of diuretics to his regimen. Currently he is on Lasix 40 mg twice a day. Weight is gone down from 131-127 pounds. He has no lower extremity edema. In fact he is close to underweight, more consistent with the fact that his diet is very picky and he continues to have problems with his GI system. His wife says that he eats very scant portions. The pressure is well-controlled today. He has not had repeat lab work which I recommended to reassess his creatinine. In the past his creatinine is been somewhat higher on increased dose of diuretics, but we may need to allow this. I'd also like to see if his chronic kidney disease would support the use of  Entresto.  12/07/2016  Robert [name] was unfortunately recently hospitalized with aphasia and weakness and was found to have multiple vessel infarcts including the left MCA, right MCA/PCA, right MCA/ACA territory concerning for a cardioembolic phenomenon. It was thought this is related to a mechanical mitral valve and being subtherapeutic with his INR. He is confused as to recently as soon as 2 weeks prior to the incident his INR was well controlled. He had reported problems with eating and was being treated by gastroenterologist for what sounds like SIBO. It's possible that the combination of his antibiotics including Cipro and Flagyl, particularly the latter, may have affected his INR. His admission INR was 2.31 however subsequent INRs were 1.74 and 1.35. He is now on twice-daily Lovenox bridging and we are working to reestablish a therapeutic INR. Fortunately he seems to have some mild deficits with mild left facial droop and some dysarthria.  03/14/2017  Robert [name] was seen today in follow-up.  He has had no further stroke or TIA symptoms.  He saw Dr. Erlinda [name] in the fall who felt like he was doing well.  We have managed to keep his INR in the 3-3.5 range.  He has been more compliant with his medication.  He now gets INR checks every 2 weeks.  Recently his blood pressure has been running higher.  His carvedilol was increased from 6.25 twice a day to 12.5 twice a day.  His wife reports no significant change in his blood pressure with this.  At times he gets a little dizzy which may be associated with his high blood pressure.  PMHx:  Past Medical History:  Diagnosis Date  . BPH (benign prostatic hyperplasia)   . Chronic anticoagulation    coumadin for mechanical valves  . CKD (chronic kidney disease), stage III ([place])   . Diabetes mellitus type 2, diet-controlled ([place])   . Gastric cancer ([place]) 2005   s/p gastrectomy  . High cholesterol   . HOH (hard of hearing)   . Hypertension   . Rheumatic heart disease    . S/P MVR (mitral valve replacement) 2015   3mm StJude mechanical valve    Past Surgical History:  Procedure Laterality Date  . CARDIAC CATHETERIZATION N/A 05/10/2015   Procedure: Right/Left Heart Cath and Coronary Angiography;  Surgeon: Burnell Blanks, MD;  Location: [place] [facility];  Service: Cardiovascular;  Laterality: N/A;  . HERNIA REPAIR    . IR ANGIO INTRA EXTRACRAN SEL COM CAROTID INNOMINATE BILAT MOD SED  12/01/2016  . IR ANGIO VERTEBRAL SEL SUBCLAVIAN INNOMINATE UNI R MOD SED  12/01/2016  . MITRAL VALVE REPLACEMENT  10/24/2013   62mm StJude mechanical valve  . PARTIAL GASTRECTOMY  2005  FAMHx:  Family History  Problem Relation Age of Onset  . Hypertension Mother   . Diabetes Mother   . Cancer Brother     SOCHx:   reports that  has never smoked. he has never used smokeless tobacco. He reports that he does not drink alcohol or use drugs.  ALLERGIES:  Allergies  Allergen Reactions  . Penicillins Nausea And Vomiting    Has patient had a PCN reaction causing immediate rash, facial/tongue/throat swelling, SOB or lightheadedness with hypotension: No Has patient had a PCN reaction causing severe rash involving mucus membranes or skin necrosis: No Has patient had a PCN reaction that required hospitalization: Unknown Has patient had a PCN reaction occurring within the last 10 years: No If all of the above answers are "NO", then may proceed with Cephalosporin use.  . Vancomycin Other (See Comments)    Kidney problems    ROS: Pertinent items noted in HPI and remainder of comprehensive ROS otherwise negative.  HOME MEDS: Current Outpatient Medications  Medication Sig Dispense Refill  . aspirin EC 81 MG tablet Take 81 mg by mouth daily.    Marland Kitchen atorvastatin (LIPITOR) 20 MG tablet Take 1 tablet (20 mg total) by mouth at bedtime. 90 tablet 3  . carvedilol (COREG) 25 MG tablet Take 1 tablet (25 mg total) by mouth 2 (two) times daily. 60 tablet 5  . chlorhexidine  (PERIDEX) 0.12 % solution by Mouth Rinse route See admin instructions. Swish and spit small amount by mouth daily at bedtime    . ferrous sulfate 325 (65 FE) MG tablet Take 1 tablet (325 mg total) by mouth 2 (two) times daily with a meal. 30 tablet 3  . furosemide (LASIX) 20 MG tablet Take 20 mg by mouth daily.    . isosorbide mononitrate (IMDUR) 30 MG 24 hr tablet Take 1 tablet (30 mg total) by mouth daily. 90 tablet 3  . moxifloxacin (VIGAMOX) 0.5 % ophthalmic solution Place 1 drop into the left eye 4 (four) times daily.    . Multiple Vitamin (MULTIVITAMIN WITH MINERALS) TABS tablet Take 1 tablet by mouth daily.    . prednisoLONE acetate (PRED FORTE) 1 % ophthalmic suspension Place 1 drop into the right eye 4 (four) times daily.    . tamsulosin (FLOMAX) 0.4 MG CAPS capsule TAKE 1 CAPSULE (0.4 MG TOTAL) BY MOUTH DAILY AFTER SUPPER. 30 capsule 10  . Thiamine Mononitrate (VITAMIN B1 PO) Take 1 tablet by mouth daily.     . vitamin B-12 (CYANOCOBALAMIN) 1000 MCG tablet Take 1 tablet (1,000 mcg total) by mouth daily. 7 tablet 0  . warfarin (COUMADIN) 3 MG tablet Take 6mg  (2 tablet) 9/25 and 9/26 and then 3mg  (1 tab) daily after. Follow up in coumadin clinic on 9/28 and 10/1 for INR check. Goal INR 2.5-3.5. Once INR reach the goal, stop lovenox injection. Continue to follow up with coumadin clinic for coumadin dosing. 100 tablet 1   No current facility-administered medications for this visit.     LABS/IMAGING: Results for orders placed or performed in visit on 03/14/17 (from the past 48 hour(s))  POCT INR     Status: None   Collection Time: 03/14/17 11:30 AM  Result Value Ref Range   INR 4.1    No results found.  WEIGHTS: Wt Readings from Last 3 Encounters:  03/14/17 131 lb 6.4 oz (59.6 kg)  02/06/17 129 lb 12.8 oz (58.9 kg)  12/07/16 124 lb 3.2 oz (56.3 kg)    VITALS: BP Marland Kitchen)  162/81   Pulse 62   Ht 5\' 6"  (1.676 m)   Wt 131 lb 6.4 oz (59.6 kg)   BMI 21.21 kg/m   EXAM: General  appearance: alert, cachectic and no distress Neck: no carotid bruit and no JVD Lungs: clear to auscultation bilaterally Heart: regular rate and rhythm, S1, S2 normal and sharp mechanical valve sounds Abdomen: soft, non-tender; bowel sounds normal; no masses,  no organomegaly and mildly protuberant Extremities: extremities normal, atraumatic, no cyanosis or edema Pulses: 2+ and symmetric Skin: Skin color, texture, turgor normal. No rashes or lesions Neurologic: Grossly normal Psych: Pleasant  EKG: Normal sinus rhythm, LVH with repolarization abnormality is 62-personally reviewed  ASSESSMENT: 1. Recent multi-infarct territory stroke-concerning for cardioembolic phenomenon 2. Acute systolic congestive heart failure-EF reduced to 45%, now up to 55-60% (2017) 3. Mild to moderate nonobstructive coronary artery disease by cath 4. Normally functioning mechanical mitral valve 5. Warfarin coagulopathy 6. Acute on chronic kidney disease stage III 7. Medication noncompliance 8. Uncontrolled hypertension  PLAN: 1.   Robert [name] seems to be doing better with medication compliance as well as better control of his INR.  He has had no further stroke or TIA events.  LVEF however is declined back to about 45%, but previously was as high as 55-60%.  Blood pressure is elevated and I would advise increasing his carvedilol further to 25 mg twice daily.  If necessary we may add hydralazine for better blood pressure control.  He does have a degree of chronic kidney disease and may not tolerate ARB or ARNI.  Follow-up with me in 1 month.  Pixie Casino, MD, [facility], [place] Director of the [facility] Lipid [facility] &  Cardiovascular Risk Reduction Clinic Attending Cardiologist  Direct Dial: [phone]  Fax: [phone]  Website:  [email or web address]  Nadean Corwin Cillian Gwinner 03/14/2017, 1:21 PM

## 2017-03-14 NOTE — Patient Instructions (Signed)
Your physician has recommended you make the following change in your medication:  -- STOP carvedilol 12.5mg  twice daily -- START carvedilol 25mg  twice daily (dose increase)  Your physician recommends that you schedule a follow-up appointment in: [place] with Dr. Debara Pickett

## 2017-03-19 ENCOUNTER — Encounter: Payer: Self-pay | Admitting: Physical Therapy

## 2017-03-19 NOTE — Therapy (Signed)
[facility] [address] [place], [place], [address] Phone: [phone]   Fax:  [phone]  Patient Details  Name: Robert [name] MRN: [id number] Date of Birth: Nov 22, 1940 Referring Provider:  No ref. provider found  Encounter Date: 03/19/2017   PHYSICAL THERAPY DISCHARGE SUMMARY  Visits from Start of Care: 1 (eval only)  Current functional level related to goals / functional outcomes: See eval   Remaining deficits: See eval-pt did not return after eval visit; on 01/09/17, he cancelled remaining PT visits.   Education / Equipment: NA  Plan: Patient agrees to discharge.  Patient goals were not met. Patient is being discharged due to not returning since the last visit.  ?????       Jermiyah Ricotta W. 03/19/2017, 10:48 AM  Mady Haagensen, PT 03/19/17 10:49 AM Phone: [phone] Fax: [facility] [address] [place], [place], [address] Phone: [phone]   Fax:  [phone]

## 2017-03-20 ENCOUNTER — Telehealth: Payer: Self-pay | Admitting: Internal Medicine

## 2017-03-20 MED ORDER — HYDRALAZINE HCL 25 MG PO TABS: 25.0000 mg | ORAL_TABLET | Freq: Two times a day (BID) | ORAL | 3 refills | Status: DC

## 2017-03-20 NOTE — Telephone Encounter (Signed)
Left message to call back  

## 2017-03-20 NOTE — Telephone Encounter (Signed)
Add hydralazine 25 mg BID for additional BP control.  Dr. Lemmie Evens

## 2017-03-20 NOTE — Telephone Encounter (Signed)
New message  Pt c/o BP issue: STAT if pt c/o blurred vision, one-sided weakness or slurred speech  1. What are your last 5 BP readings? 186/90  2. Are you having any other symptoms (ex. Dizziness, headache, blurred vision, passed out)? headache  3. What is your BP issue? Patient is having a high bp and it is not going down he also is on a high medication

## 2017-03-20 NOTE — Telephone Encounter (Signed)
Wife aware (ok per DPR) and verbalized understanding.  rx sent to pharmacy.  Advised to continue to monitor and bring BP log to appt on 1/15 with pharmD as well as 2/14 with Dr. Debara Pickett.

## 2017-03-20 NOTE — Telephone Encounter (Signed)
Received call back from wife (ok per DPR) states since the medication change his BP has continued to be elevated with BP readings as follows:  186/93, 180/87, 162/94, 168/99, 148/88, 195/108  States patient is experiencing a slight HA and minor dizziness, denies blurry vision.     Per chart review:  OV 01/02 with Dr. Debara Pickett PLAN: 1.   Robert [name] seems to be doing better with medication compliance as well as better control of his INR.  He has had no further stroke or TIA events.  LVEF however is declined back to about 45%, but previously was as high as 55-60%.  Blood pressure is elevated and I would advise increasing his carvedilol further to 25 mg twice daily.  If necessary we may add hydralazine for better blood pressure control.  He does have a degree of chronic kidney disease and may not tolerate ARB or ARNI.   Advised I would route to Dr. Debara Pickett for review/recommendations.  Wife request call back at [phone] or [phone]

## 2017-03-23 ENCOUNTER — Telehealth: Payer: Self-pay | Admitting: Internal Medicine

## 2017-03-23 MED ORDER — HYDRALAZINE HCL 25 MG PO TABS: 25.0000 mg | ORAL_TABLET | Freq: Three times a day (TID) | ORAL | 6 refills | Status: DC

## 2017-03-23 NOTE — Telephone Encounter (Signed)
Is he taking the hydralazine that I added? If so, increase to 25 mg TID. I'm surprised his BP is not any lower after adding hydralazine and increasing his carvedilol.  Dr. Lemmie Evens

## 2017-03-23 NOTE — Telephone Encounter (Signed)
Returned call to patient's wife.She stated she is concerned husband's B/P worse.Today 186/90,182/90 pulse ranging 80 to 92.Stated he is taking all medications prescribed.He has a headache.Stated she wants to know what is causing B/P to be elevated.She wants to ask Dr.Hilty is he needs more test.Advised I will send message to Dr.Hilty for advice.

## 2017-03-23 NOTE — Telephone Encounter (Signed)
Returned call to patient's wife Dr.Hilty's recommendations given.Advised to continue to monitor B/P and call back if continues to be elevated.

## 2017-03-23 NOTE — Telephone Encounter (Signed)
Returned call to patient's wife no answer.LMTC. 

## 2017-03-23 NOTE — Telephone Encounter (Signed)
New message  Patient spouse calling with concerns about BP. Please call [phone].  Pt c/o BP issue: STAT if pt c/o blurred vision, one-sided weakness or slurred speech  1. What are your last 5 BP readings? 186/90  2. Are you having any other symptoms (ex. Dizziness, headache, blurred vision, passed out)? headache  3. What is your BP issue? BP too high

## 2017-03-27 ENCOUNTER — Ambulatory Visit (INDEPENDENT_AMBULATORY_CARE_PROVIDER_SITE_OTHER): Payer: BC Managed Care – PPO | Admitting: Pharmacist

## 2017-03-27 DIAGNOSIS — Z952 Presence of prosthetic heart valve: Secondary | ICD-10-CM

## 2017-03-27 DIAGNOSIS — Z7901 Long term (current) use of anticoagulants: Secondary | ICD-10-CM | POA: Diagnosis not present

## 2017-03-27 LAB — POCT INR: INR: 2.9

## 2017-03-27 NOTE — Patient Instructions (Signed)
Increase hydralazine to 25mg  three times daily; Follow up in 2 weeks with hypertension clinic

## 2017-04-05 ENCOUNTER — Other Ambulatory Visit: Payer: Self-pay | Admitting: Internal Medicine

## 2017-04-10 ENCOUNTER — Ambulatory Visit (INDEPENDENT_AMBULATORY_CARE_PROVIDER_SITE_OTHER): Payer: BC Managed Care – PPO | Admitting: Pharmacist Clinician (PhC)/ Clinical Pharmacy Specialist

## 2017-04-10 DIAGNOSIS — I10 Essential (primary) hypertension: Secondary | ICD-10-CM | POA: Diagnosis not present

## 2017-04-10 DIAGNOSIS — Z7901 Long term (current) use of anticoagulants: Secondary | ICD-10-CM | POA: Diagnosis not present

## 2017-04-10 DIAGNOSIS — Z952 Presence of prosthetic heart valve: Secondary | ICD-10-CM | POA: Diagnosis not present

## 2017-04-10 LAB — POCT INR: INR: 3.7

## 2017-04-10 NOTE — Patient Instructions (Signed)
Return for a a follow up appointment in 2 weeks  Your blood pressure today is 136/72    Check your blood pressure at home twice daily and keep record of the readings.    Take your BP meds as follows:   Continue with your current medications  Bring all of your meds, your BP cuff and your record of home blood pressures to your next appointment.  Exercise as you're able, try to walk approximately 30 minutes per day.  Keep salt intake to a minimum, especially watch canned and prepared boxed foods.  Eat more fresh fruits and vegetables and fewer canned items.  Avoid eating in fast food restaurants.    HOW TO TAKE YOUR BLOOD PRESSURE: . Rest 5 minutes before taking your blood pressure. .  Don't smoke or drink caffeinated beverages for at least 30 minutes before. . Take your blood pressure before (not after) you eat. . Sit comfortably with your back supported and both feet on the floor (don't cross your legs). . Elevate your arm to heart level on a table or a desk. . Use the proper sized cuff. It should fit smoothly and snugly around your bare upper arm. There should be enough room to slip a fingertip under the cuff. The bottom edge of the cuff should be 1 inch above the crease of the elbow. . Ideally, take 3 measurements at one sitting and record the average.

## 2017-04-10 NOTE — Patient Instructions (Signed)
Description   Continue with 1.5 tablets daily except 1 tablet each Sunday, Tuesday and Thursday.  Repeat INR in 2 weeks

## 2017-04-10 NOTE — Assessment & Plan Note (Signed)
Patient with essential hypertension, much improved after giving the hydralazine some time to work.  While he is still not quite to goal, I am not going to increase his hydralazine dose today, as we weren't able to verify the accuracy of his home readings, most of which were done in the evening.  I have asked that they check his pressure every morning after yoga and prayers, but before breakfast.  He will bring his home cuff back in 2 weeks when he is due to see Dr. Debara Pickett.  If the home cuff shows to be accurate and home readings are still elevated, we can then increase the hydralazine.    He has a low CrCl, at 31, so while we could try an ACEI or ARB, I would be hesitant to do so.  Spironolactone could be a potential add on, as could amlodipine (although I would be concerned about further lower extremity edema).  I think with his improved pressure in the office today, he should be able to get good control with just a dose increase in hydralazine.

## 2017-04-10 NOTE — Progress Notes (Signed)
04/10/2017 Robert [name] 05/12/1940 [id number]   HPI:  Robert [name] is a 77 y.o. male patient of Dr Debara Pickett, with a PMH below who presents today for hypertension clinic evaluation.   In addition to hypertension, his medical history is significant for CHF (EF 40-45%), CAD, history of stroke, CKD stage 3 and mechanical mitral valve (on warfarin).  He is here today with his wife.    When he was seen by Dr. Debara Pickett, his blood pressure was 162/81.  His carvedilol was increased to 25 mg twice daily, and after a week hydralazine 25 mg bid was added.  Within a few days this was increased to tid, as his home pressures were unchanged.  Today he is here for follow up.   He reports home readings still elevated (see below), but does note that it comes down somewhat about 2-3 hours after taking his hydralazine doses, but it goes back up before the next dose.    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 Debara Pickett on Jan 2.  Home cuff was purchased at that time, but patient did not bring into office today.  Average of 22 readings show a BP of 157/85.  Has quiet a varied range 107-195/58-107.    Intolerances:   No antihypertensive medications Labs:  11/2016:  Na 138, K 3.5, Glu 173, SCr 1.66, BUN 19; CrCl 31  Wt Readings from Last 3 Encounters:  03/14/17 131 lb 6.4  oz (59.6 kg)  02/06/17 129 lb 12.8 oz (58.9 kg)  12/07/16 124 lb 3.2 oz (56.3 kg)   BP Readings from Last 3 Encounters:  04/10/17 136/72  03/27/17 128/70  03/14/17 (!) 162/81   Pulse Readings from Last 3 Encounters:  04/10/17 64  03/14/17 62  02/06/17 (!) 52    Current Outpatient Medications  Medication Sig Dispense Refill  . aspirin EC 81 MG tablet Take 81 mg by mouth daily.    Marland Kitchen atorvastatin (LIPITOR) 20 MG tablet Take 1 tablet (20 mg total) by mouth at bedtime. 90 tablet 3  . carvedilol (COREG) 25 MG tablet Take 1 tablet (25 mg total) by mouth 2 (two) times daily. 60 tablet 5  . chlorhexidine (PERIDEX) 0.12 % solution by Mouth Rinse route See admin instructions. Swish and spit small amount by mouth daily at bedtime    . ferrous sulfate 325 (65 FE) MG tablet Take 1 tablet (325 mg total) by mouth 2 (two) times daily with a meal. 30 tablet 3  . furosemide (LASIX) 20 MG tablet Take 20 mg by mouth daily.    . hydrALAZINE (APRESOLINE) 25 MG tablet Take 1 tablet (25 mg total) by mouth 3 (three) times daily. 90 tablet 6  . isosorbide mononitrate (IMDUR) 30 MG 24 hr tablet TAKE 1 TABLET (  30 MG TOTAL) BY MOUTH DAILY. 90 tablet 3  . moxifloxacin (VIGAMOX) 0.5 % ophthalmic solution Place 1 drop into the left eye 4 (four) times daily.    . Multiple Vitamin (MULTIVITAMIN WITH MINERALS) TABS tablet Take 1 tablet by mouth daily.    . prednisoLONE acetate (PRED FORTE) 1 % ophthalmic suspension Place 1 drop into the right eye 4 (four) times daily.    . tamsulosin (FLOMAX) 0.4 MG CAPS capsule TAKE 1 CAPSULE (0.4 MG TOTAL) BY MOUTH DAILY AFTER SUPPER. 30 capsule 10  . Thiamine Mononitrate (VITAMIN B1 PO) Take 1 tablet by mouth daily.     . vitamin B-12 (CYANOCOBALAMIN) 1000 MCG tablet Take 1 tablet (1,000 mcg total) by mouth daily. 7 tablet 0  . warfarin (COUMADIN) 3 MG tablet Take 6mg  (2 tablet) 9/25 and 9/26 and then 3mg  (1 tab) daily after. Follow up in coumadin clinic on 9/28 and 10/1 for INR  check. Goal INR 2.5-3.5. Once INR reach the goal, stop lovenox injection. Continue to follow up with coumadin clinic for coumadin dosing. 100 tablet 1   No current facility-administered medications for this visit.     Allergies  Allergen Reactions  . Penicillins Nausea And Vomiting    Has patient had a PCN reaction causing immediate rash, facial/tongue/throat swelling, SOB or lightheadedness with hypotension: No Has patient had a PCN reaction causing severe rash involving mucus membranes or skin necrosis: No Has patient had a PCN reaction that required hospitalization: Unknown Has patient had a PCN reaction occurring within the last 10 years: No If all of the above answers are "NO", then may proceed with Cephalosporin use.  . Vancomycin Other (See Comments)    Kidney problems    Past Medical History:  Diagnosis Date  . BPH (benign prostatic hyperplasia)   . Chronic anticoagulation    coumadin for mechanical valves  . CKD (chronic kidney disease), stage III ([place])   . Diabetes mellitus type 2, diet-controlled ([place])   . Gastric cancer ([place]) 2005   s/p gastrectomy  . High cholesterol   . HOH (hard of hearing)   . Hypertension   . Rheumatic heart disease   . 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  Essential hypertension Patient with essential hypertension, much improved after giving the hydralazine some time to work.  While he is still not quite to goal, I am not going to increase his hydralazine dose today, as we weren't able to verify the accuracy of his home readings, most of which were done in the evening.  I have asked that they check his pressure every morning after yoga and prayers, but before breakfast.  He will bring his home cuff back in 2 weeks when he is due to see Dr. Debara Pickett.  If the home cuff shows to be accurate and home readings are still elevated, we can then increase the hydralazine.    He has a low  CrCl, at 31, so while we could try an ACEI or ARB, I would be hesitant to do so.  Spironolactone could be a potential add on, as could amlodipine (although I would be concerned about further lower extremity edema).  I think with his improved pressure in the office today, he should be able to get good control with just a dose increase in hydralazine.     Tommy Medal PharmD CPP [facility] [address]  [place], [place] [address] [phone]

## 2017-04-25 ENCOUNTER — Other Ambulatory Visit: Payer: Self-pay | Admitting: Internal Medicine

## 2017-04-26 ENCOUNTER — Encounter: Payer: Self-pay | Admitting: Internal Medicine

## 2017-04-26 ENCOUNTER — Ambulatory Visit: Payer: BC Managed Care – PPO

## 2017-04-26 ENCOUNTER — Ambulatory Visit: Payer: BC Managed Care – PPO | Admitting: Internal Medicine

## 2017-04-26 ENCOUNTER — Ambulatory Visit (INDEPENDENT_AMBULATORY_CARE_PROVIDER_SITE_OTHER): Payer: BC Managed Care – PPO | Admitting: Pharmacist

## 2017-04-26 VITALS — BP 124/60 | HR 60 | Ht 66.0 in | Wt 138.8 lb

## 2017-04-26 DIAGNOSIS — Z7901 Long term (current) use of anticoagulants: Secondary | ICD-10-CM | POA: Diagnosis not present

## 2017-04-26 DIAGNOSIS — I10 Essential (primary) hypertension: Secondary | ICD-10-CM | POA: Diagnosis not present

## 2017-04-26 DIAGNOSIS — Z952 Presence of prosthetic heart valve: Secondary | ICD-10-CM

## 2017-04-26 DIAGNOSIS — I5021 Acute systolic (congestive) heart failure: Secondary | ICD-10-CM

## 2017-04-26 DIAGNOSIS — Z79899 Other long term (current) drug therapy: Secondary | ICD-10-CM | POA: Diagnosis not present

## 2017-04-26 LAB — POCT INR: INR: 3.5

## 2017-04-26 MED ORDER — FUROSEMIDE 40 MG PO TABS: 40.0000 mg | ORAL_TABLET | Freq: Every day | ORAL | 5 refills | Status: DC

## 2017-04-26 NOTE — Progress Notes (Signed)
OFFICE NOTE  Chief Complaint:  Follow-up hypertension, shortness of breath  Primary Care Physician: Burman Freestone, MD  HPI:  Robert [name] is a 77 y.o. 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 He has been followed by Dr. Geraldo Pitter at [facility] cardiology and was referred to Dr. Evelina Dun at [facility] in 2013 for evaluation of his mitral stenosis. He was followed clinically and underwent mechanical mitral valve replacement in 2015. Since then he's been compliant on warfarin and has had therapeutic to supratherapeutic INRs. LV function was normal prior to surgery however we cannot find echocardiographic results after his surgery. A heart catheterization performed prior to surgery demonstrated a 70% diagonal stenosis and 55% LAD stenosis, but he did not have any bypass surgery. He now presents with one week of progressive shortness of breath, orthopnea and leg edema concerning for heart failure. He seems to be responding to IV Lasix. Chest x-ray shows COPD changes, small bilateral pleural effusions and some basilar atelectasis. Labs indicate markedly elevated BNP of 1406 and troponin is negative 3. There appears to be a mild iron deficiency anemia with H&H of 9 and 29. INR initially was 4.37 that came down to 3.98. He was admitted and treated for heart failure with diuresis. He underwent right and left heart catheterization which demonstrated the following:   Prox RCA lesion, 20% stenosed.  Prox LAD to Mid LAD lesion, 30% stenosed.  Mid LAD lesion, 40% stenosed.  Ost 1st Diag to 1st Diag lesion, 50% stenosed.  Ost 2nd Diag lesion, 30% stenosed.  1. Mild to moderate non-obstructive CAD 2. Normal movement of mechanical mitral valve leaflets.  3. Elevated filling pressures suggesting residual volume overload.   He also had an echocardiogram which showed a  newly reduced LVEF of 45-50%. At discharge he was scheduled to be on Lasix 60 mg daily, and increase from his prior home dose of 40 mg daily. It was also recommended that he discontinue valsartan and spironolactone. After discharge he had routine lab work which demonstrated acute renal failure and creatinine had increased from 1.8-2.9. He was also markedly hyperkalemic at 5.7. This was brought to the attention of the [facility] DOD Dr. Marlou Porch, who recommended hydration and avoiding potassium with a repeat metabolic profile the next day. The repeat metabolic profile showed creatinine had decreased to 2.7 and potassium was 5.3. Since discharge Robert [name] had poor appetite, weight has remained stable and he is felt weak and had poor energy. Much worse than when he was discharged. I spent a good deal of time reviewing the patient's medications with his wife who had a 3 x 5 note card with his previous medications on the front and new medications on the rear. She tells me that he is actually taking all of these medications, therefore he is taking his old dose of Lasix 40 mg in addition to Lasix 60 mg daily as well as spironolactone and valsartan. Certainly explain his acute renal failure as well as hyperkalemia. I reviewed his discharge medication summary which clearly states to discontinue valsartan and Lasix and noted the dose changes of medications. He was seen in his primary care doctor's office after discharge however it appears that these medication changes were not identified. In addition, his INR is markedly elevated today at 6.2. This is up from 3.5 where it was 1 week ago. I suspect again this is related to renal  failure, decreased appetite and nutritional reasons. Medication noncompliance could be playing a role.  Robert [name] returns today for follow-up. Since we sorted out his medications he's done much better. His creatinine has gone back down to baseline of 1.8. He is on iron and B12 and his hemoglobin and  hematocrit of come up to 10.7 and 32. He reports marked improvement in his energy. He does say that he has problems with decreased energy and sleep although he feels like he gets a restful night sleep. He denies any snoring or apnea. He wakes up fairly early in the morning and does yoga and often takes a nap in the morning. Unfortunately his INR became subtherapeutic after holding his warfarin for coagulopathy. On the 20th it was 1.4. Today his INR is 1.8.  10/18/2015  Robert [name] returns today for follow-up of 2 recent hospitalizations. He was seen at [facility] and subsequently at [facility] a couple weeks later for small bowel obstruction. He does have a history of gastric cancer and is status post subtotal gastrectomy in 2005. Unfortunately he has not been able to eat and has lost a significant amount of weight. He is currently 119 pounds with a BMI of 19 and does feel somewhat weak. Fortunately, however his LV function has improved back to 55-60% by echo. He was seen in consultation and no further cardiac workup was recommended as he did have some mildly elevated troponins. Systolic to be nonspecific. He's had no problems with his mechanical mitral valve and INR today is finally controlled at 3.5. He tells me he is interested in getting some dental work including having several teeth pulled and having a denture placed at [facility] in the near future.  04/07/2016  Robert [name] presents today with some recent dyspnea on exertion. He says over the past 2 weeks she's become short of breath walking up stairs and now walking on a flat surface. EF had improved recently up to 55-60% by echo, however this was during hospitalization for a small bowel obstruction. At the time he did have elevated troponins but was felt due to a noncardiac etiology. He denies any chest pain however his progressive dyspnea and fatigue with exertion could be due to coronary ischemia. He was noted to have moderate nonobstructive  coronary disease by cath more than a year ago.  04/18/2016  Robert [name] was seen back today in follow-up. He reports some continued shortness of breath and now some lower strandy swelling. He underwent a Lexiscan Myoview which showed no ischemia however EF was back to 45%. He has had 7 pound weight gain since I last saw him consistent with acute systolic congestive heart failure. He is only on Lasix 20 mg every other day.  05/15/2016  Robert [name] returns today for follow-up. Blood pressure is elevated 156/70. He reports that increasing his Lasix has helped with some of his shortness of breath and abdominal swelling. Unfortunately did not get blood work drawn as I had requested. He is due for repeat metabolic profile and BNP. We discussed briefly the addition of Entresto he may be a good candidate for that. I like to evaluate his labs today and make a determination based on his renal function. He also is reported recently a decrease in appetite and early satiety, particularly with breads and other foods that he cannot tolerate. This sounds like more of a GI issue and I've encouraged him to follow-up with his gastroenterologist in [place].  06/06/2016  Robert [name] was seen  today in follow-up. He reports breathing improved significantly as well as the swelling reduced after adding a second dose of diuretics to his regimen. Currently he is on Lasix 40 mg twice a day. Weight is gone down from 131-127 pounds. He has no lower extremity edema. In fact he is close to underweight, more consistent with the fact that his diet is very picky and he continues to have problems with his GI system. His wife says that he eats very scant portions. The pressure is well-controlled today. He has not had repeat lab work which I recommended to reassess his creatinine. In the past his creatinine is been somewhat higher on increased dose of diuretics, but we may need to allow this. I'd also like to see if his chronic kidney disease would  support the use of Entresto.  12/07/2016  Robert [name] was unfortunately recently hospitalized with aphasia and weakness and was found to have multiple vessel infarcts including the left MCA, right MCA/PCA, right MCA/ACA territory concerning for a cardioembolic phenomenon. It was thought this is related to a mechanical mitral valve and being subtherapeutic with his INR. He is confused as to recently as soon as 2 weeks prior to the incident his INR was well controlled. He had reported problems with eating and was being treated by gastroenterologist for what sounds like SIBO. It's possible that the combination of his antibiotics including Cipro and Flagyl, particularly the latter, may have affected his INR. His admission INR was 2.31 however subsequent INRs were 1.74 and 1.35. He is now on twice-daily Lovenox bridging and we are working to reestablish a therapeutic INR. Fortunately he seems to have some mild deficits with mild left facial droop and some dysarthria.  03/14/2017  Robert [name] was seen today in follow-up.  He has had no further stroke or TIA symptoms.  He saw Dr. Erlinda Hong in the fall who felt like he was doing well.  We have managed to keep his INR in the 3-3.5 range.  He has been more compliant with his medication.  He now gets INR checks every 2 weeks.  Recently his blood pressure has been running higher.  His carvedilol was increased from 6.25 twice a day to 12.5 twice a day.  His wife reports no significant change in his blood pressure with this.  At times he gets a little dizzy which may be associated with his high blood pressure.  04/26/2017  Robert [name] was seen today in follow-up.  We have been working to improve his blood pressures.  For some reason they have increased recently.  He has been placed on hydralazine 25 mg 3 times daily in addition to his other medications and pressure today was 124/60.  Interestingly number blood pressures at home have ranged between 765 and 465 systolic.  He does have a  relatively new blood pressure cuff which is only 68 months old.  He is also complaining of shortness of breath today.  He says over the past several weeks he has been more short of breath with exertion, particularly walking up inclines or stairs.  Weight seems to be increased about 10 pounds over the past 2-4 weeks.  He reports some pitting edema of his ankles.  PMHx:  Past Medical History:  Diagnosis Date  . BPH (benign prostatic hyperplasia)   . Chronic anticoagulation    coumadin for mechanical valves  . CKD (chronic kidney disease), stage III ([place])   . Diabetes mellitus type 2, diet-controlled ([place])   . Gastric  cancer [facility]) 2005   s/p gastrectomy  . High cholesterol   . HOH (hard of hearing)   . Hypertension   . Rheumatic heart disease   . S/P MVR (mitral valve replacement) 2015   86mm StJude mechanical valve    Past Surgical History:  Procedure Laterality Date  . CARDIAC CATHETERIZATION N/A 05/10/2015   Procedure: Right/Left Heart Cath and Coronary Angiography;  Surgeon: Burnell Blanks, MD;  Location: [facility];  Service: Cardiovascular;  Laterality: N/A;  . HERNIA REPAIR    . IR ANGIO INTRA EXTRACRAN SEL COM CAROTID INNOMINATE BILAT MOD SED  12/01/2016  . IR ANGIO VERTEBRAL SEL SUBCLAVIAN INNOMINATE UNI R MOD SED  12/01/2016  . MITRAL VALVE REPLACEMENT  10/24/2013   82mm StJude mechanical valve  . PARTIAL GASTRECTOMY  2005    FAMHx:  Family History  Problem Relation Age of Onset  . Hypertension Mother   . Diabetes Mother   . Cancer Brother     SOCHx:   reports that  has never smoked. he has never used smokeless tobacco. He reports that he does not drink alcohol or use drugs.  ALLERGIES:  Allergies  Allergen Reactions  . Penicillins Nausea And Vomiting    Has patient had a PCN reaction causing immediate rash, facial/tongue/throat swelling, SOB or lightheadedness with hypotension: No Has patient had a PCN reaction causing severe rash involving mucus  membranes or skin necrosis: No Has patient had a PCN reaction that required hospitalization: Unknown Has patient had a PCN reaction occurring within the last 10 years: No If all of the above answers are "NO", then may proceed with Cephalosporin use.  . Vancomycin Other (See Comments)    Kidney problems    ROS: Pertinent items noted in HPI and remainder of comprehensive ROS otherwise negative.  HOME MEDS: Current Outpatient Medications  Medication Sig Dispense Refill  . aspirin EC 81 MG tablet Take 81 mg by mouth daily.    Marland Kitchen atorvastatin (LIPITOR) 20 MG tablet TAKE 1 TABLET (20 MG TOTAL) BY MOUTH AT BEDTIME. 90 tablet 3  . carvedilol (COREG) 25 MG tablet Take 1 tablet (25 mg total) by mouth 2 (two) times daily. 60 tablet 5  . chlorhexidine (PERIDEX) 0.12 % solution by Mouth Rinse route See admin instructions. Swish and spit small amount by mouth daily at bedtime    . ferrous sulfate 325 (65 FE) MG tablet Take 1 tablet (325 mg total) by mouth 2 (two) times daily with a meal. 30 tablet 3  . furosemide (LASIX) 40 MG tablet Take 1 tablet (40 mg total) by mouth daily. 30 tablet 5  . hydrALAZINE (APRESOLINE) 25 MG tablet Take 1 tablet (25 mg total) by mouth 3 (three) times daily. 90 tablet 6  . isosorbide mononitrate (IMDUR) 30 MG 24 hr tablet TAKE 1 TABLET (30 MG TOTAL) BY MOUTH DAILY. 90 tablet 3  . moxifloxacin (VIGAMOX) 0.5 % ophthalmic solution Place 1 drop into the left eye 4 (four) times daily.    . Multiple Vitamin (MULTIVITAMIN WITH MINERALS) TABS tablet Take 1 tablet by mouth daily.    . prednisoLONE acetate (PRED FORTE) 1 % ophthalmic suspension Place 1 drop into the right eye 4 (four) times daily.    . tamsulosin (FLOMAX) 0.4 MG CAPS capsule TAKE 1 CAPSULE (0.4 MG TOTAL) BY MOUTH DAILY AFTER SUPPER. 30 capsule 10  . Thiamine Mononitrate (VITAMIN B1 PO) Take 1 tablet by mouth daily.     . vitamin B-12 (CYANOCOBALAMIN) 1000  MCG tablet Take 1 tablet (1,000 mcg total) by mouth daily. 7  tablet 0  . warfarin (COUMADIN) 3 MG tablet Take 6mg  (2 tablet) 9/25 and 9/26 and then 3mg  (1 tab) daily after. Follow up in coumadin clinic on 9/28 and 10/1 for INR check. Goal INR 2.5-3.5. Once INR reach the goal, stop lovenox injection. Continue to follow up with coumadin clinic for coumadin dosing. 100 tablet 1   No current facility-administered medications for this visit.     LABS/IMAGING: No results found for this or any previous visit (from the past 48 hour(s)). No results found.  WEIGHTS: Wt Readings from Last 3 Encounters:  04/26/17 138 lb 12.8 oz (63 kg)  03/14/17 131 lb 6.4 oz (59.6 kg)  02/06/17 129 lb 12.8 oz (58.9 kg)    VITALS: BP 124/60   Pulse 60   Ht 5\' 6"  (1.676 m)   Wt 138 lb 12.8 oz (63 kg)   SpO2 97%   BMI 22.40 kg/m   EXAM: General appearance: alert, cachectic and no distress Neck: JVD - 2 cm above sternal notch, no carotid bruit and supple, symmetrical, trachea midline Lungs: clear to auscultation bilaterally Heart: regular rate and rhythm, S1, S2 normal and sharp mechanical valve sounds Abdomen: soft, non-tender; bowel sounds normal; no masses,  no organomegaly and mildly protuberant Extremities: edema 1+ bilateral pitting edema Pulses: 2+ and symmetric Skin: Skin color, texture, turgor normal. No rashes or lesions Neurologic: Grossly normal Psych: Pleasant  EKG: Deferred  ASSESSMENT: 1. Acute systolic congestive heart failure-EF reduced to 45%, up to 55-60% (2017) 2. Recent multi-infarct territory stroke-concerning for cardioembolic phenomenon  3. Mild to moderate nonobstructive coronary artery disease by cath 4. Normally functioning mechanical mitral valve 5. Warfarin coagulopathy 6. Acute on chronic kidney disease stage III 7. Medication noncompliance 8. Uncontrolled hypertension  PLAN: 1.   Robert [name] has symptoms of worsening shortness of breath and lower extremity edema.  He may have acute systolic congestive heart failure.  His EF had  improved in 2017.  He recently has had an increase in blood pressure which could be compensatory or have caused some cardiomyopathy.  Blood pressure is now better controlled.  I think he benefit from additional diuretic.  We will increase his Lasix to 40 mg daily.  Repeat metabolic profile in a week.  Follow-up with me in 1 month.  Pixie Casino, MD, [facility], [place] Director of the [facility] Lipid Disorders &  Cardiovascular Risk Reduction Clinic Attending Cardiologist  Direct Dial: [phone]  Fax: [phone]  Website:  [email or web address] Puerto 04/26/2017, 9:40 AM

## 2017-04-26 NOTE — Patient Instructions (Addendum)
Your physician has recommended you make the following change in your medication:  -- INCREASE lasix to 40mg  once daily  Your physician recommends that you return for lab work in [place]  Dr. Debara Pickett recommends monitoring your weight and blood pressure at home. Please record in the log provided.   Your physician recommends that you schedule a follow-up appointment in [place] with Dr. Debara Pickett

## 2017-05-04 LAB — BASIC METABOLIC PANEL
BUN/Creatinine Ratio: 11 (ref 10–24)
BUN: 19 mg/dL (ref 8–27)
CHLORIDE: 107 mmol/L — AB (ref 96–106)
CO2: 24 mmol/L (ref 20–29)
Calcium: 7.9 mg/dL — ABNORMAL LOW (ref 8.6–10.2)
Creatinine, Ser: 1.75 mg/dL — ABNORMAL HIGH (ref 0.76–1.27)
GFR calc Af Amer: 42 mL/min/{1.73_m2} — ABNORMAL LOW (ref >59)
GFR calc non Af Amer: 36 mL/min/{1.73_m2} — ABNORMAL LOW (ref >59)
GLUCOSE: 97 mg/dL (ref 65–99)
POTASSIUM: 4.8 mmol/L (ref 3.5–5.2)
SODIUM: 144 mmol/L (ref 134–144)

## 2017-05-09 ENCOUNTER — Other Ambulatory Visit: Payer: Self-pay | Admitting: Internal Medicine

## 2017-05-09 NOTE — Telephone Encounter (Signed)
Rx(s) sent to pharmacy electronically.  

## 2017-05-21 NOTE — Progress Notes (Signed)
[facility]  PATIENT: Robert [name] DOB: Apr 24, 1940   REASON FOR VISIT: Follow-up for CVA September 2018 HISTORY FROM:patient and wife    HISTORY OF PRESENT ILLNESS: RobertRobert [name] a 77 y.o.malewith history of rheumatic heart disease with mitral valve replacement (on Coumadin), hypertension, hyperlipidemia, diabetes mellitus, chronic kidney disease admitted on 12/01/16 for right-sided weakness and expressive aphasia.INR subtherapeutic at 2.31, likely due to Coumadin interaction with recent antibiotic use.  CT head and neck showed subocclusive distal left M2/M3 thrombus with associated delayed effusion within the posterior left MCA territory without cord infarct.  DSA showed occluded distal parietal M3 branch of the inferior division of left MCA.  No procedure performed.  MRI showed multifocal small patchy punctate infarcts including left MCA, right MCA/PCA, right MCA/ACA territory.  MRI showed recannulized left M2/M3 occlusion.  Has difficulty remove femoral sheath due to INR above 1.5.  Started IV heparin to bridge Coumadin.  EF 40-45%.  LDL 75 and A1c 6.0.  Once femoral sheath removed, heparin IV change to Lovenox bridge to Coumadin on discharge home.  INR goal 2.5-3.5.  Lipitor increased from 20 to 40 on discharge.  Creatinine stable, right-sided weakness resolved and aphasia improved.  Interval History11/27/18 Dr. Erlinda Hong During the interval time, the patient has been doing well.  Now back to baseline.  Follow with cardiology shortly after discharge, continued Lovenox bridge to Coumadin and recommended INR goal 3.0-3.5.  He also follow with his PCP after discharge.  No PT/OT therapy needed.  However, patient BP monitoring at home showed high BP around 165/80-90.  Today in clinic BP 172/70.  INR monitoring stable, last checked at 2.6. UPDATE 3/12/2019CM Robert [name], 77 year old male returns for follow-up with history of stroke September 2018.  He remains on aspirin  and Coumadin for secondary stroke prevention and mechanical valve.  He has minimal bruising and no bleeding.  He has not had further stroke or TIA symptoms.  Blood pressure in the office today 148/62.  He claims he takes blood pressure at home and log it every day for primary care.  He remains on Lipitor without myalgias.  He does yoga 5 days a week for exercise.  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  He denies any falls.  He returns for reevaluation no new neurologic symptoms.  REVIEW OF SYSTEMS: Full 14 system review of systems performed and notable only for those listed, all others are neg:  Constitutional: neg  Cardiovascular: neg Ear/Nose/Throat: neg  Skin: neg Eyes: neg Respiratory: neg Gastroitestinal: neg  Hematology/Lymphatic: neg  Endocrine: neg Musculoskeletal:neg Allergy/Immunology: neg Neurological: neg Psychiatric: neg Sleep : neg   ALLERGIES: Allergies  Allergen Reactions  . Penicillins Nausea And Vomiting    Has patient had a PCN reaction causing immediate rash, facial/tongue/throat swelling, SOB or lightheadedness with hypotension: No Has patient had a PCN reaction causing severe rash involving mucus membranes or skin necrosis: No Has patient had a PCN reaction that required hospitalization: Unknown Has patient had a PCN reaction occurring within the last 10 years: No If all of the above answers are "NO", then may proceed with Cephalosporin use.  . Vancomycin Other (See Comments)    Kidney problems    HOME MEDICATIONS: Outpatient Medications Prior to Visit  Medication Sig Dispense Refill  . aspirin EC 81 MG tablet Take 81 mg by mouth daily.    Marland Kitchen atorvastatin (LIPITOR) 20 MG tablet TAKE 1 TABLET (20 MG TOTAL) BY MOUTH AT BEDTIME. [place]  tablet 3  . carvedilol (COREG) 25 MG tablet Take 1 tablet (25 mg total) by mouth 2 (two) times daily. 60 tablet 5  . chlorhexidine (PERIDEX) 0.12 % solution by Mouth Rinse route See admin instructions. Swish and spit  small amount by mouth daily at bedtime    . ferrous sulfate 325 (65 FE) MG tablet Take 1 tablet (325 mg total) by mouth 2 (two) times daily with a meal. 30 tablet 3  . furosemide (LASIX) 40 MG tablet Take 1 tablet (40 mg total) by mouth daily. 30 tablet 5  . hydrALAZINE (APRESOLINE) 25 MG tablet Take 1 tablet (25 mg total) by mouth 3 (three) times daily. 90 tablet 6  . isosorbide mononitrate (IMDUR) 30 MG 24 hr tablet TAKE 1 TABLET (30 MG TOTAL) BY MOUTH DAILY. 90 tablet 3  . moxifloxacin (VIGAMOX) 0.5 % ophthalmic solution Place 1 drop into the left eye 4 (four) times daily.    . Multiple Vitamin (MULTIVITAMIN WITH MINERALS) TABS tablet Take 1 tablet by mouth daily.    . prednisoLONE acetate (PRED FORTE) 1 % ophthalmic suspension Place 1 drop into the right eye 4 (four) times daily.    . tamsulosin (FLOMAX) 0.4 MG CAPS capsule TAKE 1 CAPSULE (0.4 MG TOTAL) BY MOUTH DAILY AFTER SUPPER. 30 capsule 10  . Thiamine Mononitrate (VITAMIN B1 PO) Take 1 tablet by mouth daily.     . vitamin B-12 (CYANOCOBALAMIN) 1000 MCG tablet Take 1 tablet (1,000 mcg total) by mouth daily. 7 tablet 0  . warfarin (COUMADIN) 3 MG tablet Take 6mg  (2 tablet) 9/25 and 9/26 and then 3mg  (1 tab) daily after. Follow up in coumadin clinic on 9/28 and 10/1 for INR check. Goal INR 2.5-3.5. Once INR reach the goal, stop lovenox injection. Continue to follow up with coumadin clinic for coumadin dosing. 100 tablet 1   No facility-administered medications prior to visit.     PAST MEDICAL HISTORY: Past Medical History:  Diagnosis Date  . BPH (benign prostatic hyperplasia)   . Chronic anticoagulation    coumadin for mechanical valves  . CKD (chronic kidney disease), stage III ([place])   . Diabetes mellitus type 2, diet-controlled ([place])   . Gastric cancer ([place]) 2005   s/p gastrectomy  . High cholesterol   . HOH (hard of hearing)   . Hypertension   . Rheumatic heart disease   . S/P MVR (mitral valve replacement) 2015   57mm StJude  mechanical valve  . Stroke [facility])     PAST SURGICAL HISTORY: Past Surgical History:  Procedure Laterality Date  . CARDIAC CATHETERIZATION N/A 05/10/2015   Procedure: Right/Left Heart Cath and Coronary Angiography;  Surgeon: Burnell Blanks, MD;  Location: [facility];  Service: Cardiovascular;  Laterality: N/A;  . HERNIA REPAIR    . IR ANGIO INTRA EXTRACRAN SEL COM CAROTID INNOMINATE BILAT MOD SED  12/01/2016  . IR ANGIO VERTEBRAL SEL SUBCLAVIAN INNOMINATE UNI R MOD SED  12/01/2016  . MITRAL VALVE REPLACEMENT  10/24/2013   41mm StJude mechanical valve  . PARTIAL GASTRECTOMY  2005    FAMILY HISTORY: Family History  Problem Relation Age of Onset  . Hypertension Mother   . Diabetes Mother   . Cancer Brother     SOCIAL HISTORY: Social History   Socioeconomic History  . Marital status: Married    Spouse name: Not on file  . Number of children: Not on file  . Years of education: Not on file  . Highest education level:  Not on file  Social Needs  . Financial resource strain: Not on file  . Food insecurity - worry: Not on file  . Food insecurity - inability: Not on file  . Transportation needs - medical: Not on file  . Transportation needs - non-medical: Not on file  Occupational History  . Occupation: Animal nutritionist at [employer]  . Smoking status: Never Smoker  . Smokeless tobacco: Never Used  Substance and Sexual Activity  . Alcohol use: No    Alcohol/week: 0.0 oz  . Drug use: No  . Sexual activity: Not on file  Other Topics Concern  . Not on file  Social History Narrative   Lives in [place] with his wife.     PHYSICAL EXAM  Vitals:   05/22/17 1325  BP: (!) 148/62  Pulse: (!) 56  Weight: 132 lb 9.6 oz (60.1 kg)   Body mass index is 21.4 kg/m.  Generalized: Well developed, in no acute distress  Head: normocephalic and atraumatic,. Oropharynx benign  Neck: Supple, no carotid bruits  Cardiac: Regular rate rhythm, no murmur   Musculoskeletal: No deformity   Neurological examination   Mentation: Alert oriented to time, place, history taking. Attention span and concentration appropriate. Recent and remote memory intact.  Follows all commands speech with mild dysarthria may be due to [place] accent   Cranial nerve II-XII: Fundoscopic exam reveals sharp disc margins.Pupils were equal round reactive to light extraocular movements were full, visual field were full on confrontational test. Facial sensation and strength were normal. hearing was intact to finger rubbing bilaterally. Uvula tongue midline. head turning and shoulder shrug were normal and symmetric.Tongue protrusion into cheek strength was normal. Motor: normal bulk and tone, full strength in the BUE, BLE, fine finger movements normal, no pronator drift. No focal weakness Sensory: normal and symmetric to light touch,  Coordination: finger-nose-finger, heel-to-shin bilaterally, no dysmetria, no tremor Reflexes: 1+ upper lower and symmetric plantar responses were flexor bilaterally. Gait and Station: Rising up from seated position without assistance, stooped posture  moderate stride, good arm swing, smooth turning, able to perform tiptoe, and heel walking without difficulty. Tandem gait is steady.  No assistive device  DIAGNOSTIC DATA (LABS, IMAGING, TESTING) - I reviewed patient records, labs, notes, testing and imaging myself where available.  Lab Results  Component Value Date   WBC 8.7 12/05/2016   HGB 12.4 (L) 12/05/2016   HCT 37.5 (L) 12/05/2016   MCV 92.1 12/05/2016   PLT 163 12/05/2016      Component Value Date/Time   NA 144 05/03/2017 1207   K 4.8 05/03/2017 1207   CL 107 (H) 05/03/2017 1207   CO2 24 05/03/2017 1207   GLUCOSE 97 05/03/2017 1207   GLUCOSE 173 (H) 12/05/2016 0927   BUN 19 05/03/2017 1207   CREATININE 1.75 (H) 05/03/2017 1207   CREATININE 2.06 (H) 06/06/2016 1006   CALCIUM 7.9 (L) 05/03/2017 1207   PROT 6.9 12/01/2016 1717    ALBUMIN 3.6 12/01/2016 1717   AST 32 12/01/2016 1717   ALT 27 12/01/2016 1717   ALKPHOS 69 12/01/2016 1717   BILITOT 1.0 12/01/2016 1717   GFRNONAA 36 (L) 05/03/2017 1207   GFRAA 42 (L) 05/03/2017 1207   Lab Results  Component Value Date   CHOL 125 12/02/2016   HDL 45 12/02/2016   LDLCALC 75 12/02/2016   TRIG 27 12/02/2016   CHOLHDL 2.8 12/02/2016   Lab Results  Component Value Date   HGBA1C 6.0 (H)  12/02/2016   Lab Results  Component Value Date   VITAMINB12 114 (L) 05/06/2015       ASSESSMENT AND PLAN    77 y.o. [place] male with PMH of rheumatic heart disease with mitral valve replacement (on Coumadin), HTN, HLD, DM, CKD admitted on 12/01/16 for multifocal small patchy punctate infarcts including left MCA, right MCA/PCA, right MCA/ACA territory due toINR subtherapeutic at 2.31.  CT head and neck showed subocclusive distal left M2/M3 thrombus with associated delayed effusion within the posterior left MCA territory without cord infarct.  DSA showed occluded distal parietal M3 branch of the inferior division of left MCA.  No procedure performed.  MRA showed recannulized left M2/M3 occlusion.  EF 40-45%.  LDL 75 and A1c 6.0.  Discharged with Coumadin and aspirin. Lipitor 20 mg. .  Follow with cardiology shortly after discharge, and recommended INR goal 3.0-3.5. The patient is a current patient of Dr. Erlinda Hong  who is out of the office today . This note is sent to the work in doctor.     Plan:  Continue coumadin and ASA 81mg  for stroke prevention and for mechanical valve Continue lipitor for stroke prevention Continue follow up with Cardiology Follow up with your primary care physician for stroke risk factor modification. Recommend maintain blood pressure goal <130/80, diabetes with hemoglobin A1c goal below 7.0% and lipids with LDL cholesterol goal below 70 mg/dL.  Check BP at home and record.  Today's reading 148/62 , call PCP for medication adjustment if needed.  Healthy diet, fresh  fruits and vegetables, lean meats Regular exercise recommend at least 30 minutes a day, continue yoga Follow up in 6 months if continue stable will discharge Dennie Bible, [facility], [facility], [facility] [address] [place], [place] [address] [phone]

## 2017-05-22 ENCOUNTER — Ambulatory Visit: Payer: BC Managed Care – PPO | Admitting: Nurse Practitioner

## 2017-05-22 ENCOUNTER — Ambulatory Visit (INDEPENDENT_AMBULATORY_CARE_PROVIDER_SITE_OTHER): Payer: BC Managed Care – PPO | Admitting: Nurse Practitioner

## 2017-05-22 ENCOUNTER — Encounter: Payer: Self-pay | Admitting: Nurse Practitioner

## 2017-05-22 VITALS — BP 148/62 | HR 56 | Wt 132.6 lb

## 2017-05-22 DIAGNOSIS — E785 Hyperlipidemia, unspecified: Secondary | ICD-10-CM

## 2017-05-22 DIAGNOSIS — I10 Essential (primary) hypertension: Secondary | ICD-10-CM | POA: Diagnosis not present

## 2017-05-22 DIAGNOSIS — I63412 Cerebral infarction due to embolism of left middle cerebral artery: Secondary | ICD-10-CM | POA: Diagnosis not present

## 2017-05-22 NOTE — Patient Instructions (Signed)
Continue coumadin and ASA 81mg  for stroke prevention and for mechanical valve Continue lipitor for stroke prevention Continue follow up with Cardiology Follow up with your primary care physician for stroke risk factor modification. Recommend maintain blood pressure goal <130/80, diabetes with hemoglobin A1c goal below 7.0% and lipids with LDL cholesterol goal below 70 mg/dL.  Check BP at home and record.  Today's reading 148/62 , call PCP for medication adjustment if needed.  Healthy diet, fresh fruits and vegetables, lean meats Regular exercise recommend at least 30 minutes a day, continue yoga Follow up in 6 months

## 2017-05-23 ENCOUNTER — Ambulatory Visit (INDEPENDENT_AMBULATORY_CARE_PROVIDER_SITE_OTHER): Payer: BC Managed Care – PPO | Admitting: Pharmacist Clinician (PhC)/ Clinical Pharmacy Specialist

## 2017-05-23 DIAGNOSIS — Z952 Presence of prosthetic heart valve: Secondary | ICD-10-CM

## 2017-05-23 DIAGNOSIS — Z7901 Long term (current) use of anticoagulants: Secondary | ICD-10-CM

## 2017-05-23 LAB — POCT INR: INR: 3.5

## 2017-05-23 NOTE — Patient Instructions (Signed)
Description   Continue with 1.5 tablets every Monday, Wednesday and Friday, 1 table all other days.  Repeat INR in 2 weeks

## 2017-05-25 NOTE — Progress Notes (Signed)
I agree with the above plan 

## 2017-05-29 ENCOUNTER — Ambulatory Visit: Payer: BC Managed Care – PPO | Admitting: Nurse Practitioner

## 2017-06-06 ENCOUNTER — Encounter: Payer: Self-pay | Admitting: Nurse Practitioner

## 2017-06-07 ENCOUNTER — Encounter: Payer: Self-pay | Admitting: Internal Medicine

## 2017-06-07 ENCOUNTER — Ambulatory Visit (INDEPENDENT_AMBULATORY_CARE_PROVIDER_SITE_OTHER): Payer: BC Managed Care – PPO | Admitting: Pharmacist Clinician (PhC)/ Clinical Pharmacy Specialist

## 2017-06-07 ENCOUNTER — Ambulatory Visit: Payer: BC Managed Care – PPO | Admitting: Internal Medicine

## 2017-06-07 VITALS — BP 141/52 | HR 47 | Ht 66.0 in | Wt 131.0 lb

## 2017-06-07 DIAGNOSIS — Z952 Presence of prosthetic heart valve: Secondary | ICD-10-CM | POA: Diagnosis not present

## 2017-06-07 DIAGNOSIS — I10 Essential (primary) hypertension: Secondary | ICD-10-CM

## 2017-06-07 DIAGNOSIS — Z7901 Long term (current) use of anticoagulants: Secondary | ICD-10-CM | POA: Diagnosis not present

## 2017-06-07 DIAGNOSIS — I5022 Chronic systolic (congestive) heart failure: Secondary | ICD-10-CM

## 2017-06-07 DIAGNOSIS — N183 Chronic kidney disease, stage 3 unspecified: Secondary | ICD-10-CM

## 2017-06-07 LAB — POCT INR: INR: 4

## 2017-06-07 MED ORDER — CARVEDILOL 25 MG PO TABS: 12.5000 mg | ORAL_TABLET | Freq: Two times a day (BID) | ORAL | 5 refills | Status: DC

## 2017-06-07 NOTE — Patient Instructions (Signed)
Your physician has recommended you make the following change in your medication: Decrease Coreg to 12.5mg (1/2 tab) twice a day.  Your physician wants you to follow-up in: 6 months with Dr. Theresa Duty will receive a reminder letter in the mail two months in advance. If you don't receive a letter, please call our office to schedule the follow-up appointment.

## 2017-06-07 NOTE — Progress Notes (Signed)
OFFICE NOTE  Chief Complaint:  Fatigue  Primary Care Physician: Burman Freestone, MD  HPI:  Robert [name] is a 77 y.o. 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 He has been followed by Dr. Geraldo Pitter at [facility] cardiology and was referred to Dr. Evelina Dun at [facility] in 2013 for evaluation of his mitral stenosis. He was followed clinically and underwent mechanical mitral valve replacement in 2015. Since then he's been compliant on warfarin and has had therapeutic to supratherapeutic INRs. LV function was normal prior to surgery however we cannot find echocardiographic results after his surgery. A heart catheterization performed prior to surgery demonstrated a 70% diagonal stenosis and 55% LAD stenosis, but he did not have any bypass surgery. He now presents with one week of progressive shortness of breath, orthopnea and leg edema concerning for heart failure. He seems to be responding to IV Lasix. Chest x-ray shows COPD changes, small bilateral pleural effusions and some basilar atelectasis. Labs indicate markedly elevated BNP of 1406 and troponin is negative 3. There appears to be a mild iron deficiency anemia with H&H of 9 and 29. INR initially was 4.37 that came down to 3.98. He was admitted and treated for heart failure with diuresis. He underwent right and left heart catheterization which demonstrated the following:   Prox RCA lesion, 20% stenosed.  Prox LAD to Mid LAD lesion, 30% stenosed.  Mid LAD lesion, 40% stenosed.  Ost 1st Diag to 1st Diag lesion, 50% stenosed.  Ost 2nd Diag lesion, 30% stenosed.  1. Mild to moderate non-obstructive CAD 2. Normal movement of mechanical mitral valve leaflets.  3. Elevated filling pressures suggesting residual volume overload.   He also had an echocardiogram which showed a newly reduced LVEF of 45-50%. At  discharge he was scheduled to be on Lasix 60 mg daily, and increase from his prior home dose of 40 mg daily. It was also recommended that he discontinue valsartan and spironolactone. After discharge he had routine lab work which demonstrated acute renal failure and creatinine had increased from 1.8-2.9. He was also markedly hyperkalemic at 5.7. This was brought to the attention of the [facility] DOD Dr. Marlou Porch, who recommended hydration and avoiding potassium with a repeat metabolic profile the next day. The repeat metabolic profile showed creatinine had decreased to 2.7 and potassium was 5.3. Since discharge Robert [name] had poor appetite, weight has remained stable and he is felt weak and had poor energy. Much worse than when he was discharged. I spent a good deal of time reviewing the patient's medications with his wife who had a 3 x 5 note card with his previous medications on the front and new medications on the rear. She tells me that he is actually taking all of these medications, therefore he is taking his old dose of Lasix 40 mg in addition to Lasix 60 mg daily as well as spironolactone and valsartan. Certainly explain his acute renal failure as well as hyperkalemia. I reviewed his discharge medication summary which clearly states to discontinue valsartan and Lasix and noted the dose changes of medications. He was seen in his primary care doctor's office after discharge however it appears that these medication changes were not identified. In addition, his INR is markedly elevated today at 6.2. This is up from 3.5 where it was 1 week ago. I suspect again this is related to renal failure, decreased appetite and  nutritional reasons. Medication noncompliance could be playing a role.  Robert [name] returns today for follow-up. Since we sorted out his medications he's done much better. His creatinine has gone back down to baseline of 1.8. He is on iron and B12 and his hemoglobin and hematocrit of come up to 10.7 and  32. He reports marked improvement in his energy. He does say that he has problems with decreased energy and sleep although he feels like he gets a restful night sleep. He denies any snoring or apnea. He wakes up fairly early in the morning and does yoga and often takes a nap in the morning. Unfortunately his INR became subtherapeutic after holding his warfarin for coagulopathy. On the 20th it was 1.4. Today his INR is 1.8.  10/18/2015  Robert [name] returns today for follow-up of 2 recent hospitalizations. He was seen at [facility] and subsequently at [facility] a couple weeks later for small bowel obstruction. He does have a history of gastric cancer and is status post subtotal gastrectomy in 2005. Unfortunately he has not been able to eat and has lost a significant amount of weight. He is currently 119 pounds with a BMI of 19 and does feel somewhat weak. Fortunately, however his LV function has improved back to 55-60% by echo. He was seen in consultation and no further cardiac workup was recommended as he did have some mildly elevated troponins. Systolic to be nonspecific. He's had no problems with his mechanical mitral valve and INR today is finally controlled at 3.5. He tells me he is interested in getting some dental work including having several teeth pulled and having a denture placed at [facility] in the near future.  04/07/2016  Robert [name] presents today with some recent dyspnea on exertion. He says over the past 2 weeks she's become short of breath walking up stairs and now walking on a flat surface. EF had improved recently up to 55-60% by echo, however this was during hospitalization for a small bowel obstruction. At the time he did have elevated troponins but was felt due to a noncardiac etiology. He denies any chest pain however his progressive dyspnea and fatigue with exertion could be due to coronary ischemia. He was noted to have moderate nonobstructive coronary disease by cath more than a  year ago.  04/18/2016  Robert [name] was seen back today in follow-up. He reports some continued shortness of breath and now some lower strandy swelling. He underwent a Lexiscan Myoview which showed no ischemia however EF was back to 45%. He has had 7 pound weight gain since I last saw him consistent with acute systolic congestive heart failure. He is only on Lasix 20 mg every other day.  05/15/2016  Robert [name] returns today for follow-up. Blood pressure is elevated 156/70. He reports that increasing his Lasix has helped with some of his shortness of breath and abdominal swelling. Unfortunately did not get blood work drawn as I had requested. He is due for repeat metabolic profile and BNP. We discussed briefly the addition of Entresto he may be a good candidate for that. I like to evaluate his labs today and make a determination based on his renal function. He also is reported recently a decrease in appetite and early satiety, particularly with breads and other foods that he cannot tolerate. This sounds like more of a GI issue and I've encouraged him to follow-up with his gastroenterologist in [place].  06/06/2016  Robert [name] was seen today in follow-up. He  reports breathing improved significantly as well as the swelling reduced after adding a second dose of diuretics to his regimen. Currently he is on Lasix 40 mg twice a day. Weight is gone down from 131-127 pounds. He has no lower extremity edema. In fact he is close to underweight, more consistent with the fact that his diet is very picky and he continues to have problems with his GI system. His wife says that he eats very scant portions. The pressure is well-controlled today. He has not had repeat lab work which I recommended to reassess his creatinine. In the past his creatinine is been somewhat higher on increased dose of diuretics, but we may need to allow this. I'd also like to see if his chronic kidney disease would support the use of  Entresto.  12/07/2016  Robert [name] was unfortunately recently hospitalized with aphasia and weakness and was found to have multiple vessel infarcts including the left MCA, right MCA/PCA, right MCA/ACA territory concerning for a cardioembolic phenomenon. It was thought this is related to a mechanical mitral valve and being subtherapeutic with his INR. He is confused as to recently as soon as 2 weeks prior to the incident his INR was well controlled. He had reported problems with eating and was being treated by gastroenterologist for what sounds like SIBO. It's possible that the combination of his antibiotics including Cipro and Flagyl, particularly the latter, may have affected his INR. His admission INR was 2.31 however subsequent INRs were 1.74 and 1.35. He is now on twice-daily Lovenox bridging and we are working to reestablish a therapeutic INR. Fortunately he seems to have some mild deficits with mild left facial droop and some dysarthria.  03/14/2017  Robert [name] was seen today in follow-up.  He has had no further stroke or TIA symptoms.  He saw Dr. Erlinda Hong in the fall who felt like he was doing well.  We have managed to keep his INR in the 3-3.5 range.  He has been more compliant with his medication.  He now gets INR checks every 2 weeks.  Recently his blood pressure has been running higher.  His carvedilol was increased from 6.25 twice a day to 12.5 twice a day.  His wife reports no significant change in his blood pressure with this.  At times he gets a little dizzy which may be associated with his high blood pressure.  04/26/2017  Robert [name] was seen today in follow-up.  We have been working to improve his blood pressures.  For some reason they have increased recently.  He has been placed on hydralazine 25 mg 3 times daily in addition to his other medications and pressure today was 124/60.  Interestingly number blood pressures at home have ranged between 381 and 017 systolic.  He does have a relatively new blood  pressure cuff which is only 31 months old.  He is also complaining of shortness of breath today.  He says over the past several weeks he has been more short of breath with exertion, particularly walking up inclines or stairs.  Weight seems to be increased about 10 pounds over the past 2-4 weeks.  He reports some pitting edema of his ankles.  06/07/2017  Robert [name] returns for follow-up.  His INR was checked today was slightly elevated at 4.0.  His weight has continued to trend down.  He is not eating as much is he has been recently.  He has been using increased dose Lasix 40 mg daily and reports improvement  in breathing and swelling.  This is at the cost of a slight increase in creatinine which were tolerating.  He is also been anemic, which appears to be related to chronic blood loss in the setting of anticoagulation.  He seen GI and is being seen by hematology within the [place] [facility].  Of note he is bradycardic today with heart rates in the 40s.  This is consistently trended down over the past month and seems to be associated with his weight loss which is down 7 pounds.  PMHx:  Past Medical History:  Diagnosis Date  . BPH (benign prostatic hyperplasia)   . Chronic anticoagulation    coumadin for mechanical valves  . CKD (chronic kidney disease), stage III ([place])   . Diabetes mellitus type 2, diet-controlled ([place])   . Gastric cancer ([place]) 2005   s/p gastrectomy  . High cholesterol   . HOH (hard of hearing)   . Hypertension   . Rheumatic heart disease   . S/P MVR (mitral valve replacement) 2015   25mm StJude mechanical valve  . Stroke [facility])     Past Surgical History:  Procedure Laterality Date  . CARDIAC CATHETERIZATION N/A 05/10/2015   Procedure: Right/Left Heart Cath and Coronary Angiography;  Surgeon: Burnell Blanks, MD;  Location: [place] CV LAB;  Service: Cardiovascular;  Laterality: N/A;  . HERNIA REPAIR    . IR ANGIO INTRA EXTRACRAN SEL COM CAROTID INNOMINATE BILAT MOD  SED  12/01/2016  . IR ANGIO VERTEBRAL SEL SUBCLAVIAN INNOMINATE UNI R MOD SED  12/01/2016  . MITRAL VALVE REPLACEMENT  10/24/2013   57mm StJude mechanical valve  . PARTIAL GASTRECTOMY  2005    FAMHx:  Family History  Problem Relation Age of Onset  . Hypertension Mother   . Diabetes Mother   . Cancer Brother     SOCHx:   reports that he has never smoked. He has never used smokeless tobacco. He reports that he does not drink alcohol or use drugs.  ALLERGIES:  Allergies  Allergen Reactions  . Penicillins Nausea And Vomiting    Has patient had a PCN reaction causing immediate rash, facial/tongue/throat swelling, SOB or lightheadedness with hypotension: No Has patient had a PCN reaction causing severe rash involving mucus membranes or skin necrosis: No Has patient had a PCN reaction that required hospitalization: Unknown Has patient had a PCN reaction occurring within the last 10 years: No If all of the above answers are "NO", then may proceed with Cephalosporin use.  . Vancomycin Other (See Comments)    Kidney problems    ROS: Pertinent items noted in HPI and remainder of comprehensive ROS otherwise negative.  HOME MEDS: Current Outpatient Medications  Medication Sig Dispense Refill  . aspirin EC 81 MG tablet Take 81 mg by mouth daily.    Marland Kitchen atorvastatin (LIPITOR) 20 MG tablet TAKE 1 TABLET (20 MG TOTAL) BY MOUTH AT BEDTIME. 90 tablet 3  . carvedilol (COREG) 25 MG tablet Take 0.5 tablets (12.5 mg total) by mouth 2 (two) times daily. 60 tablet 5  . chlorhexidine (PERIDEX) 0.12 % solution by Mouth Rinse route See admin instructions. Swish and spit small amount by mouth daily at bedtime    . ferrous sulfate 325 (65 FE) MG tablet Take 1 tablet (325 mg total) by mouth 2 (two) times daily with a meal. 30 tablet 3  . furosemide (LASIX) 40 MG tablet Take 1 tablet (40 mg total) by mouth daily. 30 tablet 5  . hydrALAZINE (APRESOLINE)  25 MG tablet Take 1 tablet (25 mg total) by mouth 3  (three) times daily. 90 tablet 6  . isosorbide mononitrate (IMDUR) 30 MG 24 hr tablet TAKE 1 TABLET (30 MG TOTAL) BY MOUTH DAILY. 90 tablet 3  . moxifloxacin (VIGAMOX) 0.5 % ophthalmic solution Place 1 drop into the left eye 4 (four) times daily.    . Multiple Vitamin (MULTIVITAMIN WITH MINERALS) TABS tablet Take 1 tablet by mouth daily.    . prednisoLONE acetate (PRED FORTE) 1 % ophthalmic suspension Place 1 drop into the right eye 4 (four) times daily.    . tamsulosin (FLOMAX) 0.4 MG CAPS capsule TAKE 1 CAPSULE (0.4 MG TOTAL) BY MOUTH DAILY AFTER SUPPER. 30 capsule 10  . Thiamine Mononitrate (VITAMIN B1 PO) Take 1 tablet by mouth daily.     . vitamin B-12 (CYANOCOBALAMIN) 1000 MCG tablet Take 1 tablet (1,000 mcg total) by mouth daily. 7 tablet 0  . warfarin (COUMADIN) 3 MG tablet Take 6mg  (2 tablet) 9/25 and 9/26 and then 3mg  (1 tab) daily after. Follow up in coumadin clinic on 9/28 and 10/1 for INR check. Goal INR 2.5-3.5. Once INR reach the goal, stop lovenox injection. Continue to follow up with coumadin clinic for coumadin dosing. 100 tablet 1   No current facility-administered medications for this visit.     LABS/IMAGING: No results found for this or any previous visit (from the past 48 hour(s)). No results found.  WEIGHTS: Wt Readings from Last 3 Encounters:  06/07/17 131 lb (59.4 kg)  05/22/17 132 lb 9.6 oz (60.1 kg)  04/26/17 138 lb 12.8 oz (63 kg)    VITALS: BP (!) 141/52 (BP Location: Right Arm, Patient Position: Sitting, Cuff Size: Normal) Comment: patients automatic cuff  Pulse (!) 47   Ht 5\' 6"  (1.676 m)   Wt 131 lb (59.4 kg)   BMI 21.14 kg/m   EXAM: General appearance: alert, cachectic and no distress Neck: no carotid bruit, no JVD and thyroid not enlarged, symmetric, no tenderness/mass/nodules Lungs: clear to auscultation bilaterally Heart: regular rate and rhythm, S1, S2 normal and no murmur Abdomen: soft, non-tender; bowel sounds normal; no masses,  no  organomegaly Extremities: extremities normal, atraumatic, no cyanosis or edema Pulses: 2+ and symmetric Skin: Skin color, texture, turgor normal. No rashes or lesions Neurologic: Grossly normal Psych: Pleasant  EKG: Deferred  ASSESSMENT: 1. Symptomatic bradycardia 2. Acute systolic congestive heart failure-EF reduced to 45%, up to 55-60% (2017) 3. Recent multi-infarct territory stroke-concerning for cardioembolic phenomenon  4. Mild to moderate nonobstructive coronary artery disease by cath 5. Normally functioning mechanical mitral valve 6. Warfarin coagulopathy 7. Acute on chronic kidney disease stage III 8. Medication noncompliance 9. Hypertension  PLAN: 1.   Robert [name] notes improvement in his breathing and edema and has had about 7 pound weight loss on his diuretic.  He does not eat much and I think with his weight loss is more sensitized to his medications.  His blood pressure seems well controlled although he backed off on his hydralazine to twice daily.  His diastolics were in the 33I.  Heart rate has been in the 40s and I recommend decreasing his carvedilol to 12.5 mg twice daily.  His fatigue may also be related to anemia.  He is on iron and undergoing treatment by hematologist.  Follow-up with me in 6 months.  Pixie Casino, MD, [facility], [place] Director of the Advanced Lipid Disorders &  Cardiovascular Risk  Reduction Clinic Attending Cardiologist  Direct Dial: [phone]  Fax: [phone]  Website:  [email or web address] 06/07/2017, 10:53 AM

## 2017-06-19 ENCOUNTER — Other Ambulatory Visit: Payer: Self-pay | Admitting: Internal Medicine

## 2017-06-19 NOTE — Telephone Encounter (Signed)
REFILL 

## 2017-06-25 ENCOUNTER — Ambulatory Visit (INDEPENDENT_AMBULATORY_CARE_PROVIDER_SITE_OTHER): Payer: BC Managed Care – PPO | Admitting: Pharmacist Clinician (PhC)/ Clinical Pharmacy Specialist

## 2017-06-25 DIAGNOSIS — Z7901 Long term (current) use of anticoagulants: Secondary | ICD-10-CM | POA: Diagnosis not present

## 2017-06-25 DIAGNOSIS — Z952 Presence of prosthetic heart valve: Secondary | ICD-10-CM | POA: Diagnosis not present

## 2017-06-25 LAB — POCT INR: INR: 3.6

## 2017-06-25 NOTE — Patient Instructions (Signed)
Description   Continue with 1.5 tablets every Monday, Wednesday and Friday, 1 table all other days.  Repeat INR in  3 weeks

## 2017-07-16 ENCOUNTER — Ambulatory Visit (INDEPENDENT_AMBULATORY_CARE_PROVIDER_SITE_OTHER): Payer: BC Managed Care – PPO | Admitting: Pharmacist Clinician (PhC)/ Clinical Pharmacy Specialist

## 2017-07-16 DIAGNOSIS — I63412 Cerebral infarction due to embolism of left middle cerebral artery: Secondary | ICD-10-CM | POA: Diagnosis not present

## 2017-07-16 DIAGNOSIS — Z7901 Long term (current) use of anticoagulants: Secondary | ICD-10-CM

## 2017-07-16 DIAGNOSIS — Z952 Presence of prosthetic heart valve: Secondary | ICD-10-CM | POA: Diagnosis not present

## 2017-07-16 LAB — POCT INR: INR: 4

## 2017-07-16 NOTE — Patient Instructions (Signed)
Description   Take only 1 tablet today Monday May 6, then decrease dose to 1 tablet daily except 1.5 tablets every Monday and Friday.  Repeat INR in 2 weeks

## 2017-07-30 ENCOUNTER — Ambulatory Visit (INDEPENDENT_AMBULATORY_CARE_PROVIDER_SITE_OTHER): Payer: BC Managed Care – PPO | Admitting: Pharmacist

## 2017-07-30 DIAGNOSIS — Z952 Presence of prosthetic heart valve: Secondary | ICD-10-CM

## 2017-07-30 DIAGNOSIS — Z7901 Long term (current) use of anticoagulants: Secondary | ICD-10-CM

## 2017-07-30 LAB — POCT INR: INR: 3.2

## 2017-08-27 ENCOUNTER — Ambulatory Visit (INDEPENDENT_AMBULATORY_CARE_PROVIDER_SITE_OTHER): Payer: BC Managed Care – PPO | Admitting: Pharmacist Clinician (PhC)/ Clinical Pharmacy Specialist

## 2017-08-27 DIAGNOSIS — I63412 Cerebral infarction due to embolism of left middle cerebral artery: Secondary | ICD-10-CM

## 2017-08-27 DIAGNOSIS — Z952 Presence of prosthetic heart valve: Secondary | ICD-10-CM

## 2017-08-27 DIAGNOSIS — Z7901 Long term (current) use of anticoagulants: Secondary | ICD-10-CM

## 2017-08-27 LAB — POCT INR: INR: 3 (ref 2.0–3.0)

## 2017-08-27 MED ORDER — CARVEDILOL 12.5 MG PO TABS: 12.5000 mg | ORAL_TABLET | Freq: Two times a day (BID) | ORAL | 3 refills | Status: DC

## 2017-08-27 MED ORDER — HYDRALAZINE HCL 25 MG PO TABS: 25.0000 mg | ORAL_TABLET | Freq: Two times a day (BID) | ORAL | 3 refills | Status: DC

## 2017-09-10 ENCOUNTER — Ambulatory Visit (INDEPENDENT_AMBULATORY_CARE_PROVIDER_SITE_OTHER): Payer: BC Managed Care – PPO | Admitting: Pharmacist

## 2017-09-10 ENCOUNTER — Ambulatory Visit: Payer: BC Managed Care – PPO | Admitting: Physician Assistant

## 2017-09-10 ENCOUNTER — Encounter: Payer: Self-pay | Admitting: Physician Assistant

## 2017-09-10 VITALS — BP 115/63 | HR 49 | Ht 66.0 in | Wt 129.2 lb

## 2017-09-10 DIAGNOSIS — I10 Essential (primary) hypertension: Secondary | ICD-10-CM | POA: Diagnosis not present

## 2017-09-10 DIAGNOSIS — Z7901 Long term (current) use of anticoagulants: Secondary | ICD-10-CM

## 2017-09-10 DIAGNOSIS — Z952 Presence of prosthetic heart valve: Secondary | ICD-10-CM | POA: Diagnosis not present

## 2017-09-10 DIAGNOSIS — I5022 Chronic systolic (congestive) heart failure: Secondary | ICD-10-CM | POA: Diagnosis not present

## 2017-09-10 LAB — POCT INR: INR: 3.4 — AB (ref 2.0–3.0)

## 2017-09-10 NOTE — Progress Notes (Signed)
Cardiology Office Note   Date:  09/10/2017   ID:  Robert [name], DOB [birth year], MRN [id number]  PCP:  Burman Freestone, MD  Cardiologist: Dr. Verneita Griffes Robert Livesey, PA-C   No chief complaint on file.   History of Present Illness: Robert [name] is a 77 y.o. male with a history of mech MVR 2015 at [facility], on coumadin, nl EF preop>>45-50% 11/2016 echo, cath 2017 w/ LAD 40%, Diag 50%>>med rx, nonischemic Myoview 04/2016  Robert [name] presents for cardiology follow up.  He has lost another 2 lbs. He is not able to eat large amounts because of his history of a partial gastrectomy, he eats 3-4 times/day.  No change in po intake, not trying to lose weight.   He does yoga daily.  He walks for about 30 minutes inside the house.  He does this at least 5 days a week.  He does not get chest pain or shortness of breath with exertion.  He does not have any problem with lower extremity edema, orthopnea or PND.  He does not have palpitations.  He does not get lightheaded or dizzy.  He is concerned about the need to increase his activity.  He feels tired a lot.  Since the carvedilol was decreased, his blood pressure has been good.  He is working on [employer].  At first, informational black holes was all theoretical.  However, now that they have seen 1, they are able to correlate the theoretical data with actual data.  He is very excited about this.   Past Medical History:  Diagnosis Date  . BPH (benign prostatic hyperplasia)   . Chronic anticoagulation    coumadin for mechanical valves  . CKD (chronic kidney disease), stage III ([place])   . Diabetes mellitus type 2, diet-controlled ([place])   . Gastric cancer ([place]) 2005   s/p gastrectomy  . High cholesterol   . HOH (hard of hearing)   . Hypertension   . Rheumatic heart disease   . S/P MVR (mitral valve replacement) 2015   54mm StJude mechanical valve  . Stroke [facility])     Past Surgical History:  Procedure Laterality Date  .  CARDIAC CATHETERIZATION N/A 05/10/2015   Procedure: Right/Left Heart Cath and Coronary Angiography;  Surgeon: Burnell Blanks, MD;  Location: [place] [facility];  Service: Cardiovascular;  Laterality: N/A;  . HERNIA REPAIR    . IR ANGIO INTRA EXTRACRAN SEL COM CAROTID INNOMINATE BILAT MOD SED  12/01/2016  . IR ANGIO VERTEBRAL SEL SUBCLAVIAN INNOMINATE UNI R MOD SED  12/01/2016  . MITRAL VALVE REPLACEMENT  10/24/2013   51mm StJude mechanical valve  . PARTIAL GASTRECTOMY  2005    Current Outpatient Medications  Medication Sig Dispense Refill  . aspirin EC 81 MG tablet Take 81 mg by mouth daily.    Marland Kitchen atorvastatin (LIPITOR) 20 MG tablet TAKE 1 TABLET (20 MG TOTAL) BY MOUTH AT BEDTIME. 90 tablet 3  . calcium carbonate (OSCAL) 1500 (600 Ca) MG TABS tablet Take 1,500 mg by mouth daily with breakfast.    . carvedilol (COREG) 12.5 MG tablet Take 1 tablet (12.5 mg total) by mouth 2 (two) times daily. 180 tablet 3  . chlorhexidine (PERIDEX) 0.12 % solution by Mouth Rinse route See admin instructions. Swish and spit small amount by mouth daily at bedtime    . ferrous sulfate 325 (65 FE) MG tablet Take 1 tablet (325 mg total) by mouth 2 (two) times daily with a  meal. 30 tablet 3  . furosemide (LASIX) 40 MG tablet Take 1 tablet (40 mg total) by mouth daily. 30 tablet 5  . hydrALAZINE (APRESOLINE) 25 MG tablet Take 1 tablet (25 mg total) by mouth 2 (two) times daily. 180 tablet 3  . isosorbide mononitrate (IMDUR) 30 MG 24 hr tablet TAKE 1 TABLET (30 MG TOTAL) BY MOUTH DAILY. 90 tablet 3  . Multiple Vitamin (MULTIVITAMIN WITH MINERALS) TABS tablet Take 1 tablet by mouth daily.    . tamsulosin (FLOMAX) 0.4 MG CAPS capsule TAKE 1 CAPSULE (0.4 MG TOTAL) BY MOUTH DAILY AFTER SUPPER. 30 capsule 10  . Thiamine Mononitrate (VITAMIN B1 PO) Take 1 tablet by mouth daily.     . vitamin B-12 (CYANOCOBALAMIN) 1000 MCG tablet Take 1 tablet (1,000 mcg total) by mouth daily. 7 tablet 0  . warfarin (COUMADIN) 3 MG  tablet Take 6mg  (2 tablet) 9/25 and 9/26 and then 3mg  (1 tab) daily after. Follow up in coumadin clinic on 9/28 and 10/1 for INR check. Goal INR 2.5-3.5. Once INR reach the goal, stop lovenox injection. Continue to follow up with coumadin clinic for coumadin dosing. 100 tablet 1   No current facility-administered medications for this visit.     Allergies:   Penicillins and Vancomycin    Social History:  The patient  reports that he has never smoked. He has never used smokeless tobacco. He reports that he does not drink alcohol or use drugs.   Family History:  The patient's family history includes Cancer in his brother; Diabetes in his mother; Hypertension in his mother.    ROS:  Please see the history of present illness. All other systems are reviewed and negative.    PHYSICAL EXAM: VS:  BP 115/63   Pulse (!) 49   Ht 5\' 6"  (1.676 m)   Wt 129 lb 3.2 oz (58.6 kg)   BMI 20.85 kg/m  , BMI Body mass index is 20.85 kg/m. GEN: Well nourished, well developed, male in no acute distress  HEENT: normal for age  Neck: Minimal JVD, no carotid bruit, no masses Cardiac: RRR; crisp click, 2/6 murmur, no rubs, or gallops Respiratory:  clear to auscultation bilaterally, normal work of breathing GI: soft, nontender, nondistended, + BS MS: no deformity or atrophy; no edema; distal pulses are 2+ in all 4 extremities   Skin: warm and dry, no rash Neuro:  Strength and sensation are intact Psych: euthymic mood, full affect   EKG:  EKG is not ordered today.  CARDIAC CATH: 05/10/2015  Prox RCA lesion, 20% stenosed.  Prox LAD to Mid LAD lesion, 30% stenosed.  Mid LAD lesion, 40% stenosed.  Ost 1st Diag to 1st Diag lesion, 50% stenosed.  Ost 2nd Diag lesion, 30% stenosed.   1. Mild to moderate non-obstructive CAD 2. Normal movement of mechanical mitral valve leaflets.  3. Elevated filling pressures suggesting residual volume overload.   Recommendations: Given his volume overload, would  continue diuresis with IV Lasix. Resume coumadin tonight. Heparin drip to resume 8 hours post sheath pull. Medical management of CAD.   MYOVIEW: 04/13/2016  The left ventricular ejection fraction is mildly decreased (45-54%).  Nuclear stress EF: 45%.  There was no ST segment deviation noted during stress.  This is a low risk study.  Low risk stress nuclear study with no ischemia or infarction; EF 45 but visually appears better.   ECHO: 12/02/2016 - Left ventricle: The cavity size was normal. There was moderate   concentric hypertrophy with  severe hypertrophy of the mid   inferior myocardium (2.3cm). Systolic function was mildly to   moderately reduced. The estimated ejection fraction was in the   range of 40% to 45%. Wall motion was normal; there were no   regional wall motion abnormalities. The study is not technically   sufficient to allow evaluation of LV diastolic function. - Aortic valve: Transvalvular velocity was within the normal range.   There was no stenosis. There was mild regurgitation. - Mitral valve: A bileaflet mechanical prosthesis was present and   functioning normally. Transvalvular velocity was within the   normal range. There was no evidence for stenosis. There was no   regurgitation. - Right ventricle: The cavity size was normal. Wall thickness was   normal. Systolic function was normal. - Atrial septum: No defect or patent foramen ovale was identified. - Tricuspid valve: There was moderate regurgitation. - Pulmonic valve: There was moderate regurgitation. - Pulmonary arteries: Systolic pressure was moderately to severely   increased. PA peak pressure: 58 mm Hg (S).  Recent Labs: 12/01/2016: ALT 27 12/05/2016: Hemoglobin 12.4; Platelets 163 05/03/2017: BUN 19; Creatinine, Ser 1.75; Potassium 4.8; Sodium 144    Lipid Panel    Component Value Date/Time   CHOL 125 12/02/2016 0236   TRIG 27 12/02/2016 0236   HDL 45 12/02/2016 0236   CHOLHDL 2.8 12/02/2016  0236   VLDL 5 12/02/2016 0236   LDLCALC 75 12/02/2016 0236     Wt Readings from Last 3 Encounters:  09/10/17 129 lb 3.2 oz (58.6 kg)  06/07/17 131 lb (59.4 kg)  05/22/17 132 lb 9.6 oz (60.1 kg)     Other studies Reviewed: Additional studies/ records that were reviewed today include: Office notes, hospital records and testing.  ASSESSMENT AND PLAN:  1.  Mechanical mitral valve: I explained to the patient and his wife that he did not need an echo at this time.  His gradients were very low on the echo in September.  - His murmur is not loud and I have no reason to think that there is any problem with his valve. -Continue current therapy and follow-up as scheduled  2.  Dyspnea on exertion and fatigue: He exercises regularly.  However, he and his wife have been concerned about increasing his activity.  She states it scares her when he goes outside and gets short of breath. --  I advised that it is okay to increase his activity as long as he does not push too hard. --  He was told that he needs to keep his exertional level down to less than a 5/10, preferably a 3 or 4 when he is exercising.  He has a treadmill at home, okay to use it as long as he is not over exerting himself.  3.  Chronic systolic congestive heart failure: His weight is trending down not up and he has no significant volume overload by exam.  No med changes.  4. Chronic anticoagulation: He is compliant with follow-up appointments, he is therapeutic today.   Current medicines are reviewed at length with the patient today.  The patient has concerns regarding medicines.  Concerns were addressed  The following changes have been made: None  Labs/ tests ordered today include:   Orders Placed This Encounter  Procedures  . Comprehensive metabolic panel  . Magnesium     Disposition:   FU with Dr. Debara Pickett  Signed, Rosaria Ferries, PA-C  09/10/2017 5:05 PM    [place] Phone: [phone]; Fax:  [phone]  This note was written with the assistance of speech recognition software. Please excuse any transcriptional errors.

## 2017-09-10 NOTE — Patient Instructions (Addendum)
Medication Instructions:   Your physician recommends that you continue on your current medications as directed. Please refer to the Current Medication list given to you today.    If you need a refill on your cardiac medications before your next appointment, please call your pharmacy.  Labwork: CMET AND MAG TODAY    Testing/Procedures:  NONE ORDERED  TODAY    Follow-Up:   WITH DR HILTY IN 11-2017   Any Other Special Instructions Will Be Listed Below (If Applicable).  MAKE SURE YOU EAT 5 timess  a DAY   FOOD CHOICE ARE:   GREEK YOUGART  MEAL REPLACEMENT DRINKS

## 2017-09-12 ENCOUNTER — Other Ambulatory Visit: Payer: Self-pay | Admitting: Physician Assistant

## 2017-09-12 ENCOUNTER — Other Ambulatory Visit: Payer: Self-pay | Admitting: Internal Medicine

## 2017-09-12 DIAGNOSIS — I10 Essential (primary) hypertension: Secondary | ICD-10-CM

## 2017-09-13 LAB — COMPREHENSIVE METABOLIC PANEL
ALT: 22 IU/L (ref 0–44)
AST: 24 IU/L (ref 0–40)
Albumin/Globulin Ratio: 1.4 (ref 1.2–2.2)
Albumin: 3.8 g/dL (ref 3.5–4.8)
Alkaline Phosphatase: 66 IU/L (ref 39–117)
BILIRUBIN TOTAL: 0.6 mg/dL (ref 0.0–1.2)
BUN/Creatinine Ratio: 12 (ref 10–24)
BUN: 22 mg/dL (ref 8–27)
CO2: 27 mmol/L (ref 20–29)
Calcium: 8.3 mg/dL — ABNORMAL LOW (ref 8.6–10.2)
Chloride: 108 mmol/L — ABNORMAL HIGH (ref 96–106)
Creatinine, Ser: 1.89 mg/dL — ABNORMAL HIGH (ref 0.76–1.27)
GFR calc non Af Amer: 33 mL/min/{1.73_m2} — ABNORMAL LOW (ref >59)
GFR, EST AFRICAN AMERICAN: 38 mL/min/{1.73_m2} — AB (ref >59)
GLUCOSE: 90 mg/dL (ref 65–99)
Globulin, Total: 2.7 g/dL (ref 1.5–4.5)
Potassium: 4.8 mmol/L (ref 3.5–5.2)
Sodium: 145 mmol/L — ABNORMAL HIGH (ref 134–144)
TOTAL PROTEIN: 6.5 g/dL (ref 6.0–8.5)

## 2017-09-13 LAB — MAGNESIUM: MAGNESIUM: 2.7 mg/dL — AB (ref 1.6–2.3)

## 2017-09-25 ENCOUNTER — Ambulatory Visit: Payer: BC Managed Care – PPO | Admitting: Physician Assistant

## 2017-10-01 ENCOUNTER — Ambulatory Visit (INDEPENDENT_AMBULATORY_CARE_PROVIDER_SITE_OTHER): Payer: BC Managed Care – PPO | Admitting: Pharmacist

## 2017-10-01 DIAGNOSIS — Z7901 Long term (current) use of anticoagulants: Secondary | ICD-10-CM

## 2017-10-01 DIAGNOSIS — Z952 Presence of prosthetic heart valve: Secondary | ICD-10-CM | POA: Diagnosis not present

## 2017-10-01 LAB — POCT INR: INR: 3 (ref 2.0–3.0)

## 2017-10-30 ENCOUNTER — Ambulatory Visit (INDEPENDENT_AMBULATORY_CARE_PROVIDER_SITE_OTHER): Payer: BC Managed Care – PPO | Admitting: Pharmacist Clinician (PhC)/ Clinical Pharmacy Specialist

## 2017-10-30 DIAGNOSIS — Z952 Presence of prosthetic heart valve: Secondary | ICD-10-CM

## 2017-10-30 DIAGNOSIS — I639 Cerebral infarction, unspecified: Secondary | ICD-10-CM

## 2017-10-30 DIAGNOSIS — Z7901 Long term (current) use of anticoagulants: Secondary | ICD-10-CM | POA: Diagnosis not present

## 2017-10-30 LAB — POCT INR: INR: 2.7 (ref 2.0–3.0)

## 2017-11-13 ENCOUNTER — Ambulatory Visit (INDEPENDENT_AMBULATORY_CARE_PROVIDER_SITE_OTHER): Payer: BC Managed Care – PPO | Admitting: Pharmacist Clinician (PhC)/ Clinical Pharmacy Specialist

## 2017-11-13 DIAGNOSIS — Z952 Presence of prosthetic heart valve: Secondary | ICD-10-CM | POA: Diagnosis not present

## 2017-11-13 DIAGNOSIS — I639 Cerebral infarction, unspecified: Secondary | ICD-10-CM | POA: Diagnosis not present

## 2017-11-13 DIAGNOSIS — Z7901 Long term (current) use of anticoagulants: Secondary | ICD-10-CM | POA: Diagnosis not present

## 2017-11-13 LAB — POCT INR: INR: 2.7 (ref 2.0–3.0)

## 2017-11-13 MED ORDER — ENOXAPARIN SODIUM 60 MG/0.6ML ~~LOC~~ SOLN: 60.0000 mg | Freq: Two times a day (BID) | SUBCUTANEOUS | 0 refills | Status: DC

## 2017-11-13 NOTE — Patient Instructions (Addendum)
Sept 6: Last dose of Coumadin.  Sept 7: First injection of Lovenox tonight at 8 pm. Inject into fatty tissue in abdomen at least 2 inches from belly button.  Sept 8: Inject Lovenox in the fatty tissue every 12 hours, 8 am and 8 pm.  No coumadin  Sept 9: Inject Lovenox in the fatty tissue every 12 hours, 8am and 8pm. No Coumadin.  Sept 10: Inject Lovenox in the fatty tissue every 12 hours, 8am and 8pm. No Coumadin.  Sept 11: Inject Lovenox in the fatty tissue in the morning at 8 am (No PM dose). No Coumadin.  Sept 12: Procedure Day - No Lovenox - Resume Coumadin 4.5 mg (1.5 tablets)  Sept 13: Resume Lovenox inject in the fatty tissue every 12 hours (8 am and 8 pm) and take Coumadin 6 mg (2 tablets).  Sept 14: Inject Lovenox in the fatty tissue every 12 hours (8 am and 8 pm) and take Coumadin 6 mg (2 tablets).  Sept 15: Inject Lovenox in the fatty tissue every 12 hours (8 am and 8 pm) and take Coumadin 4.5 mg (1.5 tablets)  Sept 16: Inject Lovenox in the fatty tissue every 12 hours (8 am and 8 pm) and take Coumadin 4.5 mg (1.5 tablets)  Sept 17: Inject Lovenox in the fatty tissue at 8 am.  INR check at 1:30 with Dr. Debara Pickett appointment  Sept 18: Coumadin appt to check INR.

## 2017-11-22 ENCOUNTER — Ambulatory Visit: Payer: BC Managed Care – PPO | Admitting: Nurse Practitioner

## 2017-11-22 HISTORY — PX: SMALL BOWEL ENTEROSCOPY: SHX2415

## 2017-11-22 HISTORY — PX: COLONOSCOPY: SHX174

## 2017-11-27 ENCOUNTER — Ambulatory Visit (INDEPENDENT_AMBULATORY_CARE_PROVIDER_SITE_OTHER): Payer: BC Managed Care – PPO | Admitting: Pharmacist

## 2017-11-27 ENCOUNTER — Encounter: Payer: Self-pay | Admitting: Internal Medicine

## 2017-11-27 ENCOUNTER — Ambulatory Visit: Payer: BC Managed Care – PPO | Admitting: Internal Medicine

## 2017-11-27 VITALS — BP 130/58 | HR 54 | Ht 66.0 in | Wt 131.6 lb

## 2017-11-27 DIAGNOSIS — Z952 Presence of prosthetic heart valve: Secondary | ICD-10-CM | POA: Diagnosis not present

## 2017-11-27 DIAGNOSIS — I5022 Chronic systolic (congestive) heart failure: Secondary | ICD-10-CM | POA: Diagnosis not present

## 2017-11-27 DIAGNOSIS — Z7901 Long term (current) use of anticoagulants: Secondary | ICD-10-CM

## 2017-11-27 DIAGNOSIS — R600 Localized edema: Secondary | ICD-10-CM | POA: Diagnosis not present

## 2017-11-27 LAB — POCT INR: INR: 2.7 (ref 2.0–3.0)

## 2017-11-27 MED ORDER — FUROSEMIDE 40 MG PO TABS: 40.0000 mg | ORAL_TABLET | Freq: Every day | ORAL | 11 refills | Status: DC

## 2017-11-27 NOTE — Progress Notes (Signed)
OFFICE NOTE  Chief Complaint:  Leg swelling and discomfort  Primary Care Physician: Burman Freestone, MD  HPI:  Robert [name] is a 77 y.o. 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 He has been followed by Dr. Geraldo Pitter at [facility] cardiology and was referred to Dr. Evelina Dun at [facility] in 2013 for evaluation of his mitral stenosis. He was followed clinically and underwent mechanical mitral valve replacement in 2015. Since then he's been compliant on warfarin and has had therapeutic to supratherapeutic INRs. LV function was normal prior to surgery however we cannot find echocardiographic results after his surgery. A heart catheterization performed prior to surgery demonstrated a 70% diagonal stenosis and 55% LAD stenosis, but he did not have any bypass surgery. He now presents with one week of progressive shortness of breath, orthopnea and leg edema concerning for heart failure. He seems to be responding to IV Lasix. Chest x-ray shows COPD changes, small bilateral pleural effusions and some basilar atelectasis. Labs indicate markedly elevated BNP of 1406 and troponin is negative 3. There appears to be a mild iron deficiency anemia with H&H of 9 and 29. INR initially was 4.37 that came down to 3.98. He was admitted and treated for heart failure with diuresis. He underwent right and left heart catheterization which demonstrated the following:   Prox RCA lesion, 20% stenosed.  Prox LAD to Mid LAD lesion, 30% stenosed.  Mid LAD lesion, 40% stenosed.  Ost 1st Diag to 1st Diag lesion, 50% stenosed.  Ost 2nd Diag lesion, 30% stenosed.  1. Mild to moderate non-obstructive CAD 2. Normal movement of mechanical mitral valve leaflets.  3. Elevated filling pressures suggesting residual volume overload.   He also had an echocardiogram which showed a newly reduced LVEF  of 45-50%. At discharge he was scheduled to be on Lasix 60 mg daily, and increase from his prior home dose of 40 mg daily. It was also recommended that he discontinue valsartan and spironolactone. After discharge he had routine lab work which demonstrated acute renal failure and creatinine had increased from 1.8-2.9. He was also markedly hyperkalemic at 5.7. This was brought to the attention of the [facility] DOD Dr. Marlou Porch, who recommended hydration and avoiding potassium with a repeat metabolic profile the next day. The repeat metabolic profile showed creatinine had decreased to 2.7 and potassium was 5.3. Since discharge Robert [name] had poor appetite, weight has remained stable and he is felt weak and had poor energy. Much worse than when he was discharged. I spent a good deal of time reviewing the patient's medications with his wife who had a 3 x 5 note card with his previous medications on the front and new medications on the rear. She tells me that he is actually taking all of these medications, therefore he is taking his old dose of Lasix 40 mg in addition to Lasix 60 mg daily as well as spironolactone and valsartan. Certainly explain his acute renal failure as well as hyperkalemia. I reviewed his discharge medication summary which clearly states to discontinue valsartan and Lasix and noted the dose changes of medications. He was seen in his primary care doctor's office after discharge however it appears that these medication changes were not identified. In addition, his INR is markedly elevated today at 6.2. This is up from 3.5 where it was 1 week ago. I suspect again this is related to renal failure,  decreased appetite and nutritional reasons. Medication noncompliance could be playing a role.  Robert [name] returns today for follow-up. Since we sorted out his medications he's done much better. His creatinine has gone back down to baseline of 1.8. He is on iron and B12 and his hemoglobin and hematocrit of come  up to 10.7 and 32. He reports marked improvement in his energy. He does say that he has problems with decreased energy and sleep although he feels like he gets a restful night sleep. He denies any snoring or apnea. He wakes up fairly early in the morning and does yoga and often takes a nap in the morning. Unfortunately his INR became subtherapeutic after holding his warfarin for coagulopathy. On the 20th it was 1.4. Today his INR is 1.8.  10/18/2015  Robert [name] returns today for follow-up of 2 recent hospitalizations. He was seen at [facility] and subsequently at [facility] a couple weeks later for small bowel obstruction. He does have a history of gastric cancer and is status post subtotal gastrectomy in 2005. Unfortunately he has not been able to eat and has lost a significant amount of weight. He is currently 119 pounds with a BMI of 19 and does feel somewhat weak. Fortunately, however his LV function has improved back to 55-60% by echo. He was seen in consultation and no further cardiac workup was recommended as he did have some mildly elevated troponins. Systolic to be nonspecific. He's had no problems with his mechanical mitral valve and INR today is finally controlled at 3.5. He tells me he is interested in getting some dental work including having several teeth pulled and having a denture placed at [facility] in the near future.  04/07/2016  Robert [name] presents today with some recent dyspnea on exertion. He says over the past 2 weeks she's become short of breath walking up stairs and now walking on a flat surface. EF had improved recently up to 55-60% by echo, however this was during hospitalization for a small bowel obstruction. At the time he did have elevated troponins but was felt due to a noncardiac etiology. He denies any chest pain however his progressive dyspnea and fatigue with exertion could be due to coronary ischemia. He was noted to have moderate nonobstructive coronary disease by cath  more than a year ago.  04/18/2016  Robert [name] was seen back today in follow-up. He reports some continued shortness of breath and now some lower strandy swelling. He underwent a Lexiscan Myoview which showed no ischemia however EF was back to 45%. He has had 7 pound weight gain since I last saw him consistent with acute systolic congestive heart failure. He is only on Lasix 20 mg every other day.  05/15/2016  Robert [name] returns today for follow-up. Blood pressure is elevated 156/70. He reports that increasing his Lasix has helped with some of his shortness of breath and abdominal swelling. Unfortunately did not get blood work drawn as I had requested. He is due for repeat metabolic profile and BNP. We discussed briefly the addition of Entresto he may be a good candidate for that. I like to evaluate his labs today and make a determination based on his renal function. He also is reported recently a decrease in appetite and early satiety, particularly with breads and other foods that he cannot tolerate. This sounds like more of a GI issue and I've encouraged him to follow-up with his gastroenterologist in [place].  06/06/2016  Robert [name] was seen today  in follow-up. He reports breathing improved significantly as well as the swelling reduced after adding a second dose of diuretics to his regimen. Currently he is on Lasix 40 mg twice a day. Weight is gone down from 131-127 pounds. He has no lower extremity edema. In fact he is close to underweight, more consistent with the fact that his diet is very picky and he continues to have problems with his GI system. His wife says that he eats very scant portions. The pressure is well-controlled today. He has not had repeat lab work which I recommended to reassess his creatinine. In the past his creatinine is been somewhat higher on increased dose of diuretics, but we may need to allow this. I'd also like to see if his chronic kidney disease would support the use of  Entresto.  12/07/2016  Robert [name] was unfortunately recently hospitalized with aphasia and weakness and was found to have multiple vessel infarcts including the left MCA, right MCA/PCA, right MCA/ACA territory concerning for a cardioembolic phenomenon. It was thought this is related to a mechanical mitral valve and being subtherapeutic with his INR. He is confused as to recently as soon as 2 weeks prior to the incident his INR was well controlled. He had reported problems with eating and was being treated by gastroenterologist for what sounds like SIBO. It's possible that the combination of his antibiotics including Cipro and Flagyl, particularly the latter, may have affected his INR. His admission INR was 2.31 however subsequent INRs were 1.74 and 1.35. He is now on twice-daily Lovenox bridging and we are working to reestablish a therapeutic INR. Fortunately he seems to have some mild deficits with mild left facial droop and some dysarthria.  03/14/2017  Robert [name] was seen today in follow-up.  He has had no further stroke or TIA symptoms.  He saw Dr. Erlinda Hong in the fall who felt like he was doing well.  We have managed to keep his INR in the 3-3.5 range.  He has been more compliant with his medication.  He now gets INR checks every 2 weeks.  Recently his blood pressure has been running higher.  His carvedilol was increased from 6.25 twice a day to 12.5 twice a day.  His wife reports no significant change in his blood pressure with this.  At times he gets a little dizzy which may be associated with his high blood pressure.  04/26/2017  Robert [name] was seen today in follow-up.  We have been working to improve his blood pressures.  For some reason they have increased recently.  He has been placed on hydralazine 25 mg 3 times daily in addition to his other medications and pressure today was 124/60.  Interestingly number blood pressures at home have ranged between 542 and 706 systolic.  He does have a relatively new blood  pressure cuff which is only 19 months old.  He is also complaining of shortness of breath today.  He says over the past several weeks he has been more short of breath with exertion, particularly walking up inclines or stairs.  Weight seems to be increased about 10 pounds over the past 2-4 weeks.  He reports some pitting edema of his ankles.  06/07/2017  Robert [name] returns for follow-up.  His INR was checked today was slightly elevated at 4.0.  His weight has continued to trend down.  He is not eating as much is he has been recently.  He has been using increased dose Lasix 40 mg daily  and reports improvement in breathing and swelling.  This is at the cost of a slight increase in creatinine which were tolerating.  He is also been anemic, which appears to be related to chronic blood loss in the setting of anticoagulation.  He seen GI and is being seen by hematology within the [place] [facility].  Of note he is bradycardic today with heart rates in the 40s.  This is consistently trended down over the past month and seems to be associated with his weight loss which is down 7 pounds.  11/27/2017  Robert [name] is seen today in follow-up.  Overall he seems to be doing well.  He had a repeat INR today which was 2.7.  He recently went on Lovenox for a colonoscopy which was negative.  He does report lower extremity edema.  He says he has some discomfort he notes his legs after standing for several hours.  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  It tends to be worse at the end of the day.  He notes that after elevating his feet at night the symptoms improve.  Weight has actually been fairly stable.  PMHx:  Past Medical History:  Diagnosis Date  . BPH (benign prostatic hyperplasia)   . Chronic anticoagulation    coumadin for mechanical valves  . CKD (chronic kidney disease), stage III ([place])   . Diabetes mellitus type 2, diet-controlled ([place])   . Gastric cancer ([place]) 2005   s/p gastrectomy  . High cholesterol     . HOH (hard of hearing)   . Hypertension   . Rheumatic heart disease   . S/P MVR (mitral valve replacement) 2015   1mm StJude mechanical valve  . Stroke [facility])     Past Surgical History:  Procedure Laterality Date  . CARDIAC CATHETERIZATION N/A 05/10/2015   Procedure: Right/Left Heart Cath and Coronary Angiography;  Surgeon: Burnell Blanks, MD;  Location: [place] [facility];  Service: Cardiovascular;  Laterality: N/A;  . HERNIA REPAIR    . IR ANGIO INTRA EXTRACRAN SEL COM CAROTID INNOMINATE BILAT MOD SED  12/01/2016  . IR ANGIO VERTEBRAL SEL SUBCLAVIAN INNOMINATE UNI R MOD SED  12/01/2016  . MITRAL VALVE REPLACEMENT  10/24/2013   81mm StJude mechanical valve  . PARTIAL GASTRECTOMY  2005    FAMHx:  Family History  Problem Relation Age of Onset  . Hypertension Mother   . Diabetes Mother   . Cancer Brother     SOCHx:   reports that he has never smoked. He has never used smokeless tobacco. He reports that he does not drink alcohol or use drugs.  ALLERGIES:  Allergies  Allergen Reactions  . Penicillins Nausea And Vomiting    Has patient had a PCN reaction causing immediate rash, facial/tongue/throat swelling, SOB or lightheadedness with hypotension: No Has patient had a PCN reaction causing severe rash involving mucus membranes or skin necrosis: No Has patient had a PCN reaction that required hospitalization: Unknown Has patient had a PCN reaction occurring within the last 10 years: No If all of the above answers are "NO", then may proceed with Cephalosporin use.  . Vancomycin Other (See Comments)    Kidney problems    ROS: Pertinent items noted in HPI and remainder of comprehensive ROS otherwise negative.  HOME MEDS: Current Outpatient Medications  Medication Sig Dispense Refill  . aspirin EC 81 MG tablet Take 81 mg by mouth daily.    Marland Kitchen atorvastatin (LIPITOR) 20 MG tablet TAKE 1 TABLET (  20 MG TOTAL) BY MOUTH AT BEDTIME. 90 tablet 3  . calcium carbonate (OSCAL)  1500 (600 Ca) MG TABS tablet Take 1,500 mg by mouth daily with breakfast.    . carvedilol (COREG) 12.5 MG tablet Take 12.5 mg by mouth 2 (two) times daily with a meal.    . chlorhexidine (PERIDEX) 0.12 % solution by Mouth Rinse route See admin instructions. Swish and spit small amount by mouth daily at bedtime    . ferrous sulfate 325 (65 FE) MG tablet Take 1 tablet (325 mg total) by mouth 2 (two) times daily with a meal. 30 tablet 3  . furosemide (LASIX) 40 MG tablet Take 1 tablet (40 mg total) by mouth daily. 30 tablet 5  . hydrALAZINE (APRESOLINE) 25 MG tablet Take 25 mg by mouth 2 (two) times daily.    . isosorbide mononitrate (IMDUR) 30 MG 24 hr tablet TAKE 1 TABLET (30 MG TOTAL) BY MOUTH DAILY. 90 tablet 3  . Multiple Vitamin (MULTIVITAMIN WITH MINERALS) TABS tablet Take 1 tablet by mouth daily.    . tamsulosin (FLOMAX) 0.4 MG CAPS capsule Take 0.8 mg by mouth daily.    . Thiamine Mononitrate (VITAMIN B1 PO) Take 1 tablet by mouth daily.     . vitamin B-12 (CYANOCOBALAMIN) 1000 MCG tablet Take 1 tablet (1,000 mcg total) by mouth daily. 7 tablet 0  . warfarin (COUMADIN) 3 MG tablet Take 6mg  (2 tablet) 9/25 and 9/26 and then 3mg  (1 tab) daily after. Follow up in coumadin clinic on 9/28 and 10/1 for INR check. Goal INR 2.5-3.5. Once INR reach the goal, stop lovenox injection. Continue to follow up with coumadin clinic for coumadin dosing. 100 tablet 1   No current facility-administered medications for this visit.     LABS/IMAGING: No results found for this or any previous visit (from the past 48 hour(s)). No results found.  WEIGHTS: Wt Readings from Last 3 Encounters:  11/27/17 131 lb 9.6 oz (59.7 kg)  09/10/17 129 lb 3.2 oz (58.6 kg)  06/07/17 131 lb (59.4 kg)    VITALS: BP (!) 160/72   Pulse (!) 54   Ht 5\' 6"  (1.676 m)   Wt 131 lb 9.6 oz (59.7 kg)   BMI 21.24 kg/m   EXAM: General appearance: alert, cachectic and no distress Neck: no carotid bruit, no JVD and thyroid not  enlarged, symmetric, no tenderness/mass/nodules Lungs: clear to auscultation bilaterally Heart: regular rate and rhythm, S1, S2 normal and no murmur Abdomen: soft, non-tender; bowel sounds normal; no masses,  no organomegaly Extremities: extremities normal, atraumatic, no cyanosis or edema Pulses: 2+ and symmetric Skin: Skin color, texture, turgor normal. No rashes or lesions Neurologic: Grossly normal Psych: Pleasant  EKG: Sinus bradycardia 54, LVH with repol-personally reviewed  ASSESSMENT: 1. Symptomatic bradycardia 2. Acute systolic congestive heart failure-EF reduced to 45%, up to 55-60% (2017) 3. Recent multi-infarct territory stroke-concerning for cardioembolic phenomenon  4. Mild to moderate nonobstructive coronary artery disease by cath 5. Normally functioning mechanical mitral valve 6. Warfarin coagulopathy 7. Acute on chronic kidney disease stage III 8. Medication noncompliance 9. Hypertension  PLAN: 1.   Robert [name] is maintaining a therapeutic INR.  He is reporting lower extremity swelling which is worse after standing on his feet for several hours and this is complicated by leg tenderness and achiness, probably from edema.  This is most likely due to poor venous return.  I think he benefit from bilateral knee-high compression stockings 20 to 30 mmHg.  He can  wear this during the day when standing or sitting for long periods of time and take them off at night before bed to allow his skin to breathe.  No changes to warfarin today as per pharmacy his INR was 2.7.  Follow-up with me in 6 months.  Pixie Casino, MD, [facility] [place] Director of the [facility]  [facility] Attending Cardiologist  Direct Dial: [phone]  Fax: [phone]  Website:  [email or web address] 11/27/2017, 1:43 PM

## 2017-11-27 NOTE — Patient Instructions (Signed)
Dr. Debara Pickett recommends KNEE HIGH COMPRESSION STOCKINGS  -- 20-30 mmHg (compression strength) -- [facility]  -- [address]  -- [place]. [place]   -- [place]  -- [address] [place]  -- [phone]  Your physician wants you to follow-up in: 6 months with Dr. Debara Pickett. You will receive a reminder letter in the mail two months in advance. If you don't receive a letter, please call our office to schedule the follow-up appointment.    How to Use Compression Stockings Compression stockings are elastic socks that squeeze the legs. They help to increase blood flow to the legs, decrease swelling in the legs, and reduce the chance of developing blood clots in the lower legs. Compression stockings are often used by people who:  Are recovering from surgery.  Have poor circulation in their legs.  Are prone to getting blood clots in their legs.  Have varicose veins.  Sit or stay in bed for long periods of time. How to use compression stockings Before you put on your compression stockings:  Make sure that they are the correct size. If you do not know your size, ask your health care provider.  Make sure that they are clean, dry, and in good condition.  Check them for rips and tears. Do not put them on if they are ripped or torn. Put your stockings on first thing in the morning, before you get out of bed. Keep them on for as long as your health care provider advises. When you are wearing your stockings:  Keep them as smooth as possible. Do not allow them to bunch up. It is especially important to prevent the stockings from bunching up around your toes or behind your knees.  Do not roll the stockings downward and leave them rolled down. This can decrease blood flow to your leg.  Change them right away if they become wet or dirty. When you take off your  stockings, inspect your legs and feet. Anything that does not seem normal may require medical attention. Look for:  Open sores.  Red spots.  Swelling. Information and tips  Do not stop wearing your compression stockings without talking to your health care provider first.  Wash your stockings every day with mild detergent in cold or warm water. Do not use bleach. Air-dry your stockings or dry them in a clothes dryer on low heat.  Replace your stockings every 3-6 months.  If skin moisturizing is part of your treatment plan, apply lotion or cream at night so that your skin will be dry when you put on the stockings in the morning. It is harder to put the stockings on when you have lotion on your legs or feet. Contact a health care provider if: Remove your stockings and seek medical care if:  You have a feeling of pins and needles in your feet or legs.  You have any new changes in your skin.  You have skin lesions that are getting worse.  You have swelling or pain that is getting worse. Get help right away if:  You have numbness or tingling in your lower legs that does not get better right after you take the stockings off.  Your toes or feet become cold and blue.  You develop open sores or red spots on your legs that do not go away.  You see or feel a warm spot on  your leg.  You have new swelling or soreness in your leg.  You are short of breath or you have chest pain for no reason.  You have a rapid or irregular heartbeat.  You feel light-headed or dizzy. This information is not intended to replace advice given to you by your health care provider. Make sure you discuss any questions you have with your health care provider. Document Released: 12/25/2008 Document Revised: 07/28/2015 Document Reviewed: 02/04/2014 Elsevier Interactive Patient Education  2017 [employer].

## 2017-12-13 ENCOUNTER — Ambulatory Visit (INDEPENDENT_AMBULATORY_CARE_PROVIDER_SITE_OTHER): Payer: BC Managed Care – PPO | Admitting: Pharmacist Clinician (PhC)/ Clinical Pharmacy Specialist

## 2017-12-13 DIAGNOSIS — Z7901 Long term (current) use of anticoagulants: Secondary | ICD-10-CM

## 2017-12-13 DIAGNOSIS — Z952 Presence of prosthetic heart valve: Secondary | ICD-10-CM | POA: Diagnosis not present

## 2017-12-13 LAB — POCT INR: INR: 3.3 — AB (ref 2.0–3.0)

## 2017-12-13 NOTE — Patient Instructions (Signed)
Description   Continue with 1.5 tablet daily except 1 tablets every Tuesday and Thursday.  Repeat INR in 3 weeks.       

## 2017-12-19 ENCOUNTER — Telehealth: Payer: Self-pay | Admitting: Pharmacist Clinician (PhC)/ Clinical Pharmacy Specialist

## 2017-12-19 NOTE — Telephone Encounter (Signed)
Had tooth extracted yesterday, states had bleeding all day yesterday, called EMS late evening due to continued bleeding.  Tooth space was packed and he was told to continue with pressure and gauze.  Held warfarin dose last night.  Today wife calls, reports bleeding slowed considerably, but still some.  Advised to use ice packs on his face and continue with gauze/pressure.  Also advised to avoid hot foods/drinks for the day.  If bleeding gets worse should contact EMS/ER.  Otherwise he would be better off to take warfarin tonight due to high stroke risk.  Wife voiced understanding.

## 2018-01-03 ENCOUNTER — Ambulatory Visit (INDEPENDENT_AMBULATORY_CARE_PROVIDER_SITE_OTHER): Payer: BC Managed Care – PPO | Admitting: Pharmacist Clinician (PhC)/ Clinical Pharmacy Specialist

## 2018-01-03 DIAGNOSIS — Z952 Presence of prosthetic heart valve: Secondary | ICD-10-CM

## 2018-01-03 DIAGNOSIS — I63412 Cerebral infarction due to embolism of left middle cerebral artery: Secondary | ICD-10-CM

## 2018-01-03 DIAGNOSIS — Z7901 Long term (current) use of anticoagulants: Secondary | ICD-10-CM

## 2018-01-03 LAB — POCT INR: INR: 4.7 — AB (ref 2.0–3.0)

## 2018-01-03 NOTE — Patient Instructions (Signed)
Description   No warfarin today Thursday Oct 24, then only 1 tablet on Friday Oct 25.  Then continue with 1.5 tablet daily except 1 tablets every Tuesday and Thursday.  Repeat INR in 2 weeks.

## 2018-01-17 ENCOUNTER — Ambulatory Visit (INDEPENDENT_AMBULATORY_CARE_PROVIDER_SITE_OTHER): Payer: BC Managed Care – PPO | Admitting: Pharmacist Clinician (PhC)/ Clinical Pharmacy Specialist

## 2018-01-17 DIAGNOSIS — Z952 Presence of prosthetic heart valve: Secondary | ICD-10-CM

## 2018-01-17 DIAGNOSIS — Z7901 Long term (current) use of anticoagulants: Secondary | ICD-10-CM

## 2018-01-17 DIAGNOSIS — I63412 Cerebral infarction due to embolism of left middle cerebral artery: Secondary | ICD-10-CM | POA: Diagnosis not present

## 2018-01-17 LAB — POCT INR: INR: 3.5 — AB (ref 2.0–3.0)

## 2018-01-21 ENCOUNTER — Other Ambulatory Visit: Payer: Self-pay | Admitting: Pharmacist Clinician (PhC)/ Clinical Pharmacy Specialist

## 2018-01-21 MED ORDER — WARFARIN SODIUM 3 MG PO TABS: ORAL_TABLET | ORAL | 0 refills | Status: DC

## 2018-01-31 ENCOUNTER — Ambulatory Visit (INDEPENDENT_AMBULATORY_CARE_PROVIDER_SITE_OTHER): Payer: BC Managed Care – PPO | Admitting: Pharmacist Clinician (PhC)/ Clinical Pharmacy Specialist

## 2018-01-31 DIAGNOSIS — Z952 Presence of prosthetic heart valve: Secondary | ICD-10-CM | POA: Diagnosis not present

## 2018-01-31 DIAGNOSIS — Z7901 Long term (current) use of anticoagulants: Secondary | ICD-10-CM

## 2018-01-31 DIAGNOSIS — I63412 Cerebral infarction due to embolism of left middle cerebral artery: Secondary | ICD-10-CM | POA: Diagnosis not present

## 2018-01-31 LAB — POCT INR: INR: 3.6 — AB (ref 2.0–3.0)

## 2018-01-31 NOTE — Patient Instructions (Signed)
Description   Continue with 1.5 tablet daily except 1 tablets every Tuesday and Thursday.  Repeat INR in 3 weeks.

## 2018-02-20 ENCOUNTER — Ambulatory Visit (INDEPENDENT_AMBULATORY_CARE_PROVIDER_SITE_OTHER): Payer: BC Managed Care – PPO | Admitting: Pharmacist Clinician (PhC)/ Clinical Pharmacy Specialist

## 2018-02-20 DIAGNOSIS — Z7901 Long term (current) use of anticoagulants: Secondary | ICD-10-CM

## 2018-02-20 DIAGNOSIS — Z952 Presence of prosthetic heart valve: Secondary | ICD-10-CM | POA: Diagnosis not present

## 2018-02-20 LAB — POCT INR: INR: 6.1 — AB (ref 2.0–3.0)

## 2018-02-21 ENCOUNTER — Other Ambulatory Visit: Payer: Self-pay | Admitting: Internal Medicine

## 2018-02-25 ENCOUNTER — Other Ambulatory Visit: Payer: Self-pay

## 2018-02-25 MED ORDER — CARVEDILOL 12.5 MG PO TABS: 12.5000 mg | ORAL_TABLET | Freq: Two times a day (BID) | ORAL | 9 refills | Status: DC

## 2018-02-25 NOTE — Telephone Encounter (Signed)
Coreg ordered incorrectly. Screen started freezing and encounter closed.

## 2018-03-04 ENCOUNTER — Ambulatory Visit (INDEPENDENT_AMBULATORY_CARE_PROVIDER_SITE_OTHER): Payer: BC Managed Care – PPO | Admitting: Pharmacist

## 2018-03-04 DIAGNOSIS — Z952 Presence of prosthetic heart valve: Secondary | ICD-10-CM | POA: Diagnosis not present

## 2018-03-04 DIAGNOSIS — Z7901 Long term (current) use of anticoagulants: Secondary | ICD-10-CM

## 2018-03-04 LAB — POCT INR: INR: 4 — AB (ref 2.0–3.0)

## 2018-03-27 ENCOUNTER — Other Ambulatory Visit: Payer: Self-pay | Admitting: Internal Medicine

## 2018-03-27 NOTE — Telephone Encounter (Signed)
Rx request sent to pharmacy.  

## 2018-04-03 ENCOUNTER — Ambulatory Visit (INDEPENDENT_AMBULATORY_CARE_PROVIDER_SITE_OTHER): Payer: BC Managed Care – PPO | Admitting: Pharmacist Clinician (PhC)/ Clinical Pharmacy Specialist

## 2018-04-03 DIAGNOSIS — Z7901 Long term (current) use of anticoagulants: Secondary | ICD-10-CM

## 2018-04-03 DIAGNOSIS — Z952 Presence of prosthetic heart valve: Secondary | ICD-10-CM

## 2018-04-03 LAB — POCT INR: INR: 4.3 — AB (ref 2.0–3.0)

## 2018-04-03 NOTE — Patient Instructions (Signed)
No warfarin today Wednesday Jan 22, then decrease dose to 1 tablet daily except for 1 and 1/2 every Sunday and Thursday.  Repeat INR in 2 weeks.

## 2018-04-14 ENCOUNTER — Other Ambulatory Visit: Payer: Self-pay | Admitting: Internal Medicine

## 2018-04-15 ENCOUNTER — Ambulatory Visit (INDEPENDENT_AMBULATORY_CARE_PROVIDER_SITE_OTHER): Payer: BC Managed Care – PPO | Admitting: *Deleted

## 2018-04-15 DIAGNOSIS — Z7901 Long term (current) use of anticoagulants: Secondary | ICD-10-CM

## 2018-04-15 DIAGNOSIS — Z952 Presence of prosthetic heart valve: Secondary | ICD-10-CM | POA: Diagnosis not present

## 2018-04-15 LAB — POCT INR: INR: 3.4 — AB (ref 2.0–3.0)

## 2018-04-15 NOTE — Patient Instructions (Signed)
Description   Continue taking  1 tablet daily except for 1 and 1/2 every Sunday and Thursday.  Repeat INR in 2 weeks.

## 2018-04-17 ENCOUNTER — Other Ambulatory Visit: Payer: Self-pay | Admitting: Internal Medicine

## 2018-04-29 ENCOUNTER — Ambulatory Visit (INDEPENDENT_AMBULATORY_CARE_PROVIDER_SITE_OTHER): Payer: BC Managed Care – PPO | Admitting: *Deleted

## 2018-04-29 DIAGNOSIS — Z7901 Long term (current) use of anticoagulants: Secondary | ICD-10-CM | POA: Diagnosis not present

## 2018-04-29 DIAGNOSIS — Z952 Presence of prosthetic heart valve: Secondary | ICD-10-CM | POA: Diagnosis not present

## 2018-04-29 LAB — POCT INR: INR: 3.1 — AB (ref 2.0–3.0)

## 2018-04-29 NOTE — Patient Instructions (Signed)
Description   Continue taking  1 tablet daily except for 1 and 1/2 every Sunday and Thursday.  Repeat INR in 3 weeks.

## 2018-05-17 ENCOUNTER — Ambulatory Visit (INDEPENDENT_AMBULATORY_CARE_PROVIDER_SITE_OTHER): Payer: BC Managed Care – PPO | Admitting: Pharmacist Clinician (PhC)/ Clinical Pharmacy Specialist

## 2018-05-17 DIAGNOSIS — Z952 Presence of prosthetic heart valve: Secondary | ICD-10-CM | POA: Diagnosis not present

## 2018-05-17 DIAGNOSIS — Z7901 Long term (current) use of anticoagulants: Secondary | ICD-10-CM

## 2018-05-17 DIAGNOSIS — I63412 Cerebral infarction due to embolism of left middle cerebral artery: Secondary | ICD-10-CM | POA: Diagnosis not present

## 2018-05-17 LAB — POCT INR: INR: 2.5 (ref 2.0–3.0)

## 2018-05-20 ENCOUNTER — Telehealth: Payer: Self-pay | Admitting: Pharmacist Clinician (PhC)/ Clinical Pharmacy Specialist

## 2018-05-20 ENCOUNTER — Encounter: Payer: Self-pay | Admitting: Pharmacist Clinician (PhC)/ Clinical Pharmacy Specialist

## 2018-05-27 NOTE — Telephone Encounter (Signed)
error 

## 2018-05-30 ENCOUNTER — Telehealth: Payer: Self-pay

## 2018-05-30 NOTE — Telephone Encounter (Signed)

## 2018-05-31 ENCOUNTER — Ambulatory Visit (INDEPENDENT_AMBULATORY_CARE_PROVIDER_SITE_OTHER): Payer: BC Managed Care – PPO | Admitting: *Deleted

## 2018-05-31 DIAGNOSIS — Z952 Presence of prosthetic heart valve: Secondary | ICD-10-CM | POA: Diagnosis not present

## 2018-05-31 DIAGNOSIS — Z7901 Long term (current) use of anticoagulants: Secondary | ICD-10-CM

## 2018-05-31 LAB — POCT INR: INR: 2.2 (ref 2.0–3.0)

## 2018-05-31 NOTE — Patient Instructions (Signed)
Description   Today take 1.5 tablets, then change your dose to 1.5 tablets daily except 1 tablet on Mondays, Wednesdays and Fridays. Repeat INR in 10 days.

## 2018-06-07 ENCOUNTER — Telehealth: Payer: Self-pay | Admitting: Pharmacist Clinician (PhC)/ Clinical Pharmacy Specialist

## 2018-06-07 ENCOUNTER — Telehealth: Payer: Self-pay

## 2018-06-07 NOTE — Telephone Encounter (Signed)
Pt needed to be placed on Thursday 4/2 so I got them rescheduled

## 2018-06-07 NOTE — Telephone Encounter (Signed)

## 2018-06-11 ENCOUNTER — Telehealth: Payer: Self-pay

## 2018-06-11 NOTE — Telephone Encounter (Signed)

## 2018-06-13 ENCOUNTER — Ambulatory Visit (INDEPENDENT_AMBULATORY_CARE_PROVIDER_SITE_OTHER): Payer: BC Managed Care – PPO | Admitting: Pharmacist

## 2018-06-13 DIAGNOSIS — Z952 Presence of prosthetic heart valve: Secondary | ICD-10-CM | POA: Diagnosis not present

## 2018-06-13 DIAGNOSIS — Z7901 Long term (current) use of anticoagulants: Secondary | ICD-10-CM

## 2018-06-13 LAB — POCT INR: INR: 3.5 — AB (ref 2.0–3.0)

## 2018-06-28 ENCOUNTER — Other Ambulatory Visit: Payer: Self-pay | Admitting: Pharmacist Clinician (PhC)/ Clinical Pharmacy Specialist

## 2018-06-28 ENCOUNTER — Other Ambulatory Visit: Payer: Self-pay | Admitting: Internal Medicine

## 2018-06-28 MED ORDER — WARFARIN SODIUM 3 MG PO TABS: ORAL_TABLET | ORAL | 0 refills | Status: DC

## 2018-06-28 NOTE — Telephone Encounter (Signed)
°*  STAT* If patient is at the pharmacy, call can be transferred to refill team.   1. Which medications need to be refilled? (please list name of each medication and dose if known) Isosorbide, Warfin  2. Which pharmacy/location (including street and city if local pharmacy) is medication to be sent to? [facility] [phone]  3. Do they need a 30 day or 90 day supply? 90 and refills, Warfiin  100 and refills  3 months

## 2018-07-02 ENCOUNTER — Telehealth: Payer: Self-pay

## 2018-07-02 NOTE — Telephone Encounter (Signed)
lmom for prescreen  

## 2018-07-04 ENCOUNTER — Other Ambulatory Visit: Payer: Self-pay

## 2018-07-04 ENCOUNTER — Ambulatory Visit (INDEPENDENT_AMBULATORY_CARE_PROVIDER_SITE_OTHER): Payer: BC Managed Care – PPO | Admitting: *Deleted

## 2018-07-04 DIAGNOSIS — Z7901 Long term (current) use of anticoagulants: Secondary | ICD-10-CM

## 2018-07-04 DIAGNOSIS — Z952 Presence of prosthetic heart valve: Secondary | ICD-10-CM | POA: Diagnosis not present

## 2018-07-04 LAB — POCT INR: INR: 3.7 — AB (ref 2.0–3.0)

## 2018-07-04 NOTE — Patient Instructions (Signed)
Description   Spoke with pt/wife and instructed pt to take 1 tablet today then continue taking 1.5 tablets daily except 1 tablet on Mondays, Wednesdays and Fridays. Repeat INR in 4 weeks.

## 2018-07-12 ENCOUNTER — Telehealth: Payer: Self-pay | Admitting: Internal Medicine

## 2018-07-12 NOTE — Telephone Encounter (Signed)
Spoke with patient wife- states he has had SOB for the past few weeks- noted if he is walking it is worse, not noted at rest, denies swelling in hands/feet, denies chest pain, denies diet changes, patient is taking all medications as prescribed, they would like an appointment to be seen virtually with either Dr.Hilty or PA- since they were suppose to have an appointment follow up in 6 months from last 11/2017 visit with Dr.Hilty.   Please advise.

## 2018-07-12 NOTE — Telephone Encounter (Signed)
° ° °  Pt c/o Shortness Of Breath: STAT if SOB developed within the last 24 hours or pt is noticeably SOB on the phone  1. Are you currently SOB (can you hear that pt is SOB on the phone)? no  2. How long have you been experiencing SOB? 2 weeks  3. Are you SOB when sitting or when up moving around? Moving around , walking  4. Are you currently experiencing any other symptoms? no

## 2018-07-15 ENCOUNTER — Telehealth (INDEPENDENT_AMBULATORY_CARE_PROVIDER_SITE_OTHER): Payer: BC Managed Care – PPO | Admitting: Internal Medicine

## 2018-07-15 ENCOUNTER — Encounter: Payer: Self-pay | Admitting: Internal Medicine

## 2018-07-15 ENCOUNTER — Other Ambulatory Visit: Payer: Self-pay

## 2018-07-15 ENCOUNTER — Telehealth: Payer: Self-pay | Admitting: Internal Medicine

## 2018-07-15 VITALS — BP 140/85 | Ht 66.0 in | Wt 132.0 lb

## 2018-07-15 DIAGNOSIS — I5022 Chronic systolic (congestive) heart failure: Secondary | ICD-10-CM

## 2018-07-15 DIAGNOSIS — R0602 Shortness of breath: Secondary | ICD-10-CM | POA: Diagnosis not present

## 2018-07-15 DIAGNOSIS — Z79899 Other long term (current) drug therapy: Secondary | ICD-10-CM | POA: Diagnosis not present

## 2018-07-15 DIAGNOSIS — Z7901 Long term (current) use of anticoagulants: Secondary | ICD-10-CM

## 2018-07-15 DIAGNOSIS — Z952 Presence of prosthetic heart valve: Secondary | ICD-10-CM

## 2018-07-15 DIAGNOSIS — I5021 Acute systolic (congestive) heart failure: Secondary | ICD-10-CM

## 2018-07-15 DIAGNOSIS — N183 Chronic kidney disease, stage 3 unspecified: Secondary | ICD-10-CM

## 2018-07-15 DIAGNOSIS — Z7189 Other specified counseling: Secondary | ICD-10-CM

## 2018-07-15 NOTE — Telephone Encounter (Signed)
Spoke with patient's wife. They are agreeable to video virtual visit with Dr. Debara Pickett today 07/15/2018 @ 3:30pm. Confirmed phone number at which patient can be reached (noted in appt notes) and advised patient to have VS info and med list available to review when [place] calls.      Virtual Visit Pre-Appointment Phone Call  "I am calling you today to discuss your upcoming appointment. We are currently trying to limit exposure to the virus that causes COVID-19 by seeing patients at home rather than in the office."  1. "What is the BEST phone number to call the day of the visit?" - see appointment notes  2. "Do you have or have access to (through a family member/friend) a smartphone with video capability that we can use for your visit?" a. If yes - list this number in appt notes as "cell" (if different from BEST phone #) and list the appointment type as a VIDEO visit in appointment notes b. If no - list the appointment type as a PHONE visit in appointment notes  3. Confirm consent - "In the setting of the current Covid19 crisis, you are scheduled for a (phone or video) visit with your provider on (date) at (time).  Just as we do with many in-office visits, in order for you to participate in this visit, we must obtain consent.  If you'd like, I can send this to your mychart (if signed up) or email for you to review.  Otherwise, I can obtain your verbal consent now.  All virtual visits are billed to your insurance company just like a normal visit would be.  By agreeing to a virtual visit, we'd like you to understand that the technology does not allow for your provider to perform an examination, and thus may limit your provider's ability to fully assess your condition. If your provider identifies any concerns that need to be evaluated in person, we will make arrangements to do so.  Finally, though the technology is pretty good, we cannot assure that it will always work on either your or our end, and in the setting  of a video visit, we may have to convert it to a phone-only visit.  In either situation, we cannot ensure that we have a secure connection.  Are you willing to proceed?" STAFF: Did the patient verbally acknowledge consent to telehealth visit? Document YES/NO here: YES  4. Advise patient to be prepared - "Two hours prior to your appointment, go ahead and check your blood pressure, pulse, oxygen saturation, and your weight (if you have the equipment to check those) and write them all down. When your visit starts, your provider will ask you for this information. If you have an Apple Watch or Kardia device, please plan to have heart rate information ready on the day of your appointment. Please have a pen and paper handy nearby the day of the visit as well."  5. Give patient instructions for MyChart download to smartphone OR Doximity/Doxy.me as below if video visit (depending on what platform provider is using)  6. Inform patient they will receive a phone call 15 minutes prior to their appointment time (may be from unknown caller ID) so they should be prepared to answer    TELEPHONE CALL NOTE  Robert [name] has been deemed a candidate for a follow-up tele-health visit to limit community exposure during the Covid-19 pandemic. I spoke with the patient via phone to ensure availability of phone/video source, confirm preferred email & phone number, and  discuss instructions and expectations.  I reminded Robert [name] to be prepared with any vital sign and/or heart rhythm information that could potentially be obtained via home monitoring, at the time of his visit. I reminded Robert [name] to expect a phone call prior to his visit.  [name], RN 07/15/2018 11:08 AM   INSTRUCTIONS FOR DOWNLOADING THE MYCHART APP TO SMARTPHONE  - The patient must first make sure to have activated MyChart and know their login information - If Apple, go to [employer] and type in MyChart in the search bar and  download the app. If Android, ask patient to go to [employer] and type in [facility] in the search bar and download the app. The app is free but as with any other app downloads, their phone may require them to verify saved payment information or Apple/Android password.  - The patient will need to then log into the app with their MyChart username and password, and select [facility] as their healthcare provider to link the account. When it is time for your visit, go to the MyChart app, find appointments, and click Begin Video Visit. Be sure to Select Allow for your device to access the Microphone and Camera for your visit. You will then be connected, and your provider will be with you shortly.  **If they have any issues connecting, or need assistance please contact MyChart service desk [phone])**  **If using a computer, in order to ensure the best quality for their visit they will need to use either of the following Internet Browsers: [employer], or Google Chrome**  IF USING DOXIMITY or DOXY.ME - The patient will receive a link just prior to their visit by text.     FULL LENGTH CONSENT FOR TELE-HEALTH VISIT   I hereby voluntarily request, consent and authorize [facility] and its employed or contracted physicians, physician assistants, nurse practitioners or other licensed health care professionals (the Practitioner), to provide me with telemedicine health care services (the "Services") as deemed necessary by the treating Practitioner. I acknowledge and consent to receive the Services by the Practitioner via telemedicine. I understand that the telemedicine visit will involve communicating with the Practitioner through live audiovisual communication technology and the disclosure of certain medical information by electronic transmission. I acknowledge that I have been given the opportunity to request an in-person assessment or other available alternative prior to the  telemedicine visit and am voluntarily participating in the telemedicine visit.  I understand that I have the right to withhold or withdraw my consent to the use of telemedicine in the course of my care at any time, without affecting my right to future care or treatment, and that the Practitioner or I may terminate the telemedicine visit at any time. I understand that I have the right to inspect all information obtained and/or recorded in the course of the telemedicine visit and may receive copies of available information for a reasonable fee.  I understand that some of the potential risks of receiving the Services via telemedicine include:  Marland Kitchen Delay or interruption in medical evaluation due to technological equipment failure or disruption; . Information transmitted may not be sufficient (e.g. poor resolution of images) to allow for appropriate medical decision making by the Practitioner; and/or  . In rare instances, security protocols could fail, causing a breach of personal health information.  Furthermore, I acknowledge that it is my responsibility to provide information about my medical history, conditions and care that is complete  and accurate to the best of my ability. I acknowledge that Practitioner's advice, recommendations, and/or decision may be based on factors not within their control, such as incomplete or inaccurate data provided by me or distortions of diagnostic images or specimens that may result from electronic transmissions. I understand that the practice of medicine is not an exact science and that Practitioner makes no warranties or guarantees regarding treatment outcomes. I acknowledge that I will receive a copy of this consent concurrently upon execution via email to the email address I last provided but may also request a printed copy by calling the office of [facility].    I understand that my insurance will be billed for this visit.   I have read or had this consent read to me.  . I understand the contents of this consent, which adequately explains the benefits and risks of the Services being provided via telemedicine.  . I have been provided ample opportunity to ask questions regarding this consent and the Services and have had my questions answered to my satisfaction. . I give my informed consent for the services to be provided through the use of telemedicine in my medical care  By participating in this telemedicine visit I agree to the above.

## 2018-07-15 NOTE — Telephone Encounter (Signed)
New message ° ° °Patient is returning your call. °

## 2018-07-15 NOTE — Telephone Encounter (Signed)
Ok .. we'll try and schedule a virtual appt with me this week.  Dr Lemmie Evens

## 2018-07-15 NOTE — Patient Instructions (Addendum)
Medication Instructions:  Continue current medications If you need a refill on your cardiac medications before your next appointment, please call your pharmacy.   Lab work: Non-fasting lab work this week - CMET, BNP This can be done in Dr. Lysbeth Penner office No appointment is needed Please wear a mask for your protection/safety If you have labs (blood work) drawn today and your tests are completely normal, you will receive your results only by: Marland Kitchen MyChart Message (if you have MyChart) OR . A paper copy in the mail If you have any lab test that is abnormal or we need to change your treatment, we will call you to review the results.  Testing/Procedures: Your physician has requested that you have an echocardiogram. Echocardiography is a painless test that uses sound waves to create images of your heart. It provides your doctor with information about the size and shape of your heart and how well your heart's chambers and valves are working. This procedure takes approximately one hour. There are no restrictions for this procedure. -- this will be completed in June/July -- you will be called to arrange an appointment for this  -- the test is completed @ [address] - [address] Floor  Follow-Up: At [facility], you and your health needs are our priority.  As part of our continuing mission to provide you with exceptional heart care, we have created designated Provider Care Teams.  These Care Teams include your primary Cardiologist (physician) and Advanced Practice Providers (APPs -  Physician Assistants and Nurse Practitioners) who all work together to provide you with the care you need, when you need it. You will need a follow up appointment in about 1 month (after echo) You may see Pixie Casino, MD or one of the following Advanced Practice Providers on your designated Care Team: Almyra Deforest, [place] . Fabian Sharp, PA-C

## 2018-07-15 NOTE — Telephone Encounter (Signed)
LMTCB to schedule virtual visit per MD

## 2018-07-15 NOTE — Progress Notes (Signed)
Virtual Visit via Video Note   This visit type was conducted due to national recommendations for restrictions regarding the COVID-19 Pandemic (e.g. social distancing) in an effort to limit this patient's exposure and mitigate transmission in our community.  Due to his co-morbid illnesses, this patient is at least at moderate risk for complications without adequate follow up.  This format is felt to be most appropriate for this patient at this time.  All issues noted in this document were discussed and addressed.  A limited physical exam was performed with this format.  Please refer to the patient's chart for his consent to telehealth for [facility].   Evaluation Performed:  Doximity video visit  Date:  07/15/2018   ID:  Robert [name], DOB 1940/11/11, MRN [id number]  Patient Location:  [place] [address]  Provider location:   [address] [place] [address], [place] [address]  PCP:  Burman Freestone, MD  Cardiologist:  Pixie Casino, MD Electrophysiologist:  None   Chief Complaint:  Shortness of breath  History of Present Illness:    Robert [name] is a 78 y.o. male who presents via audio/video conferencing for a telehealth visit today.  Mr. Feldpausch is seen today for a virtual visit.  He recently called the office with complaints of some worsening shortness of breath.  He notes this is worse over the past 2 weeks particularly at night when laying down but he says when he gets up and starts moving around during the day his symptoms are better.  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  As mentioned, the past 2 weeks he has had  shortness of breath mainly when lying down.  He denies any worsening edema.  His weight is about 1 pound heavier than it had been recently.  He does say that his shortness of breath may be getting somewhat better.  He definitely has some symptoms that sound like orthopnea.  He has some mild dyspnea on exertion however it does improve again during the day.  The patient does not have symptoms concerning for COVID-19 infection (fever, chills, cough, or new SHORTNESS OF BREATH).    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   High cholesterol    HOH (hard of hearing)    Hypertension    Rheumatic heart disease    S/P MVR (mitral valve replacement) 2015   31mm StJude mechanical valve   Stroke [facility])     Past Surgical History:  Procedure Laterality Date   CARDIAC CATHETERIZATION N/A 05/10/2015   Procedure: Right/Left Heart Cath and Coronary Angiography;  Surgeon: Burnell Blanks, MD;  Location: [facility];  Service: Cardiovascular;  Laterality: N/A;   HERNIA REPAIR     IR ANGIO INTRA EXTRACRAN SEL COM CAROTID INNOMINATE BILAT MOD SED  12/01/2016  IR ANGIO VERTEBRAL SEL SUBCLAVIAN INNOMINATE UNI R MOD SED  12/01/2016   MITRAL VALVE REPLACEMENT  10/24/2013   68mm StJude mechanical valve   PARTIAL GASTRECTOMY  2005    FAMHx:  Family History  Problem Relation Age of Onset   Hypertension Mother    Diabetes Mother    Cancer Brother     SOCHx:   reports that he has never smoked. He has never used smokeless tobacco. He reports that he does not drink alcohol or use drugs.  ALLERGIES:  Allergies  Allergen Reactions    Penicillins Nausea And Vomiting    Has patient had a PCN reaction causing immediate rash, facial/tongue/throat swelling, SOB or lightheadedness with hypotension: No Has patient had a PCN reaction causing severe rash involving mucus membranes or skin necrosis: No Has patient had a PCN reaction that required hospitalization: Unknown Has patient had a PCN reaction occurring within the last 10 years: No If all of the above answers are "NO", then may proceed with Cephalosporin use.   Vancomycin Other (See Comments)    Kidney problems    MEDS:  Current Meds  Medication Sig   aspirin EC 81 MG tablet Take 81 mg by mouth daily.   atorvastatin (LIPITOR) 20 MG tablet TAKE 1 TABLET BY MOUTH EVERYDAY AT BEDTIME   calcium carbonate (OSCAL) 1500 (600 Ca) MG TABS tablet Take 1,500 mg by mouth daily with breakfast.   carvedilol (COREG) 12.5 MG tablet Take 1 tablet (12.5 mg total) by mouth 2 (two) times daily with a meal.   chlorhexidine (PERIDEX) 0.12 % solution by Mouth Rinse route See admin instructions. Swish and spit small amount by mouth daily at bedtime   ferrous sulfate 325 (65 FE) MG tablet Take 1 tablet (325 mg total) by mouth 2 (two) times daily with a meal.   furosemide (LASIX) 40 MG tablet Take 1 tablet (40 mg total) by mouth daily.   hydrALAZINE (APRESOLINE) 25 MG tablet Take 25 mg by mouth 2 (two) times daily.   isosorbide mononitrate (IMDUR) 30 MG 24 hr tablet TAKE 1 TABLET BY MOUTH EVERY DAY   Multiple Vitamin (MULTIVITAMIN WITH MINERALS) TABS tablet Take 1 tablet by mouth daily.   tamsulosin (FLOMAX) 0.4 MG CAPS capsule Take 0.8 mg by mouth daily.   Thiamine Mononitrate (VITAMIN B1 PO) Take 1 tablet by mouth daily.    vitamin B-12 (CYANOCOBALAMIN) 1000 MCG tablet Take 1 tablet (1,000 mcg total) by mouth daily.   warfarin (COUMADIN) 3 MG tablet TAKE 1 TO 1 & 1/2 TABLETS BY MOUTH DAILY AS DIRECTED BY COUMADIN CLINIC     ROS: Pertinent items noted in HPI and remainder of  comprehensive ROS otherwise negative.  Labs/Other Tests and Data Reviewed:    Recent Labs: 09/12/2017: ALT 22; BUN 22; Creatinine, Ser 1.89; Magnesium 2.7; Potassium 4.8; Sodium 145   Recent Lipid Panel Lab Results  Component Value Date/Time   CHOL 125 12/02/2016 02:36 AM   TRIG 27 12/02/2016 02:36 AM   HDL 45 12/02/2016 02:36 AM   CHOLHDL 2.8 12/02/2016 02:36 AM   LDLCALC 75 12/02/2016 02:36 AM    Wt Readings from Last 3 Encounters:  07/15/18 132 lb (59.9 kg)  11/27/17 131 lb 9.6 oz (59.7 kg)  09/10/17 129 lb 3.2 oz (58.6 kg)     Exam:    Vital Signs:  BP 140/85    Ht 5\' 6"  (1.676 m)    Wt 132 lb (59.9 kg)    BMI 21.31 kg/m  General appearance: alert, no distress and thin Lungs: No audible wheeze or visual respiratory difficulty Extremities: extremities normal, atraumatic, no cyanosis or edema Skin: Skin color, texture, turgor normal. No rashes or lesions Neurologic: Mental status: Alert, oriented, thought content appropriate Psych: Pleasant  ASSESSMENT & PLAN:    1. Dyspnea/orthopnea 2. Acute systolic congestive heart failure-EF reduced to 45%, up to 55-60% (2017) 3. Recent multi-infarct territory stroke-concerning for cardioembolic phenomenon  4. Mild to moderate nonobstructive coronary artery disease by cath 5. Normally functioning mechanical mitral valve 6. Warfarin coagulopathy 7. Acute on chronic kidney disease stage III 8. Medication noncompliance 9. Hypertension  Mr. Mangel has had shortness of breath and some orthopnea over the past 2 weeks.  His symptoms are somewhat better during the day.  He does have advanced chronic kidney disease but has not had recent lab work.  His INR recently was elevated at 3.7 requiring some alteration in his warfarin.  I would like to recheck lab work including metabolic profile and BNP.  He is also overdue for an echocardiogram, with his last study being about a year and a half ago.  Since we are starting to open up elective  testing, will get an echocardiogram and consider adjusting his diuretic based on those findings.  Follow-up with me after his echo.  COVID-19 Education: The signs and symptoms of COVID-19 were discussed with the patient and how to seek care for testing (follow up with PCP or arrange E-visit).  The importance of social distancing was discussed today.  Patient Risk:   After full review of this patients clinical status, I feel that they are at least moderate risk at this time.  Time:   Today, I have spent 25 minutes with the patient with telehealth technology discussing echocardiogram, shortness of breath, chronic kidney disease, mechanical mitral valve, INR management.     Medication Adjustments/Labs and Tests Ordered: Current medicines are reviewed at length with the patient today.  Concerns regarding medicines are outlined above.   Tests Ordered: No orders of the defined types were placed in this encounter.   Medication Changes: No orders of the defined types were placed in this encounter.   Disposition:  in 1 month(s)  Pixie Casino, MD, [facility], [place] Director of the Advanced Lipid Disorders &  Cardiovascular Risk Reduction Clinic Diplomate of the American Board of Clinical Lipidology Attending Cardiologist  Direct Dial: [phone]   Fax: [phone]  Website:  [email or web address]  Pixie Casino, MD  07/15/2018 3:31 PM

## 2018-07-15 NOTE — Telephone Encounter (Signed)
Patient/wife contacted after telemedicine visit with Dr. Debara Pickett to review instructions. Patient plans to have non-fasting lab work tomorrow 07/16/2018. Patient is scheduled for echo 08/02/2018. Scheduled patient for follow up with MD on 5/29 @ 1130 video virtual visit. No further assistance needed at this time.

## 2018-07-17 ENCOUNTER — Telehealth: Payer: Self-pay | Admitting: Internal Medicine

## 2018-07-17 LAB — COMPREHENSIVE METABOLIC PANEL
ALT: 28 IU/L (ref 0–44)
AST: 29 IU/L (ref 0–40)
Albumin/Globulin Ratio: 1.3 (ref 1.2–2.2)
Albumin: 3.8 g/dL (ref 3.7–4.7)
Alkaline Phosphatase: 77 IU/L (ref 39–117)
BUN/Creatinine Ratio: 10 (ref 10–24)
BUN: 17 mg/dL (ref 8–27)
Bilirubin Total: 0.6 mg/dL (ref 0.0–1.2)
CO2: 23 mmol/L (ref 20–29)
Calcium: 8.4 mg/dL — ABNORMAL LOW (ref 8.6–10.2)
Chloride: 105 mmol/L (ref 96–106)
Creatinine, Ser: 1.68 mg/dL — ABNORMAL HIGH (ref 0.76–1.27)
GFR calc Af Amer: 44 mL/min/{1.73_m2} — ABNORMAL LOW (ref >59)
GFR calc non Af Amer: 38 mL/min/{1.73_m2} — ABNORMAL LOW (ref >59)
Globulin, Total: 3 g/dL (ref 1.5–4.5)
Glucose: 103 mg/dL — ABNORMAL HIGH (ref 65–99)
Potassium: 5.2 mmol/L (ref 3.5–5.2)
Sodium: 140 mmol/L (ref 134–144)
Total Protein: 6.8 g/dL (ref 6.0–8.5)

## 2018-07-17 LAB — PRO B NATRIURETIC PEPTIDE: NT-Pro BNP: 4358 pg/mL — ABNORMAL HIGH (ref 0–486)

## 2018-07-17 MED ORDER — FUROSEMIDE 40 MG PO TABS: 40.0000 mg | ORAL_TABLET | Freq: Two times a day (BID) | ORAL | 11 refills | Status: DC

## 2018-07-17 NOTE — Telephone Encounter (Signed)
Patient's wife called with lab results. Lasix Rx sent to pharmacy.

## 2018-07-31 ENCOUNTER — Telehealth: Payer: Self-pay

## 2018-07-31 ENCOUNTER — Telehealth (HOSPITAL_COMMUNITY): Payer: Self-pay | Admitting: Cardiology

## 2018-07-31 ENCOUNTER — Telehealth: Payer: Self-pay | Admitting: Internal Medicine

## 2018-07-31 NOTE — Telephone Encounter (Signed)

## 2018-07-31 NOTE — Telephone Encounter (Signed)

## 2018-07-31 NOTE — Telephone Encounter (Signed)
Spoke to Dominica - echo - she states  The appt has to be changed due to covid protocol   new appt will be  08/02/18 at 9:45 am .  informed patient , he verbalized understanding , and stated to call if there is any changes.

## 2018-07-31 NOTE — Telephone Encounter (Signed)
Spoke to wife,  Wife does not want to change appointment- due to appt with dr hilty on 08/09/18 to follow up echo report. Per wife patient has been having some shortness of breathe issue.  RN informed wife -   Will try an Public librarian or someone in that dept. RN  LEFT MESSAGE WITH ECHO SUP'R awaitng  Call back

## 2018-07-31 NOTE — Telephone Encounter (Signed)
F/U Message             Patent's wife is calling to ask why do they need to reschedule his 05/22 appointment. They rec'd a call to reschedule but they are confused as to why, no explantation was giving. Patient also has an appointment on 05/29. Pls call to advise.

## 2018-08-01 ENCOUNTER — Ambulatory Visit (INDEPENDENT_AMBULATORY_CARE_PROVIDER_SITE_OTHER): Payer: BC Managed Care – PPO | Admitting: Pharmacist

## 2018-08-01 DIAGNOSIS — Z952 Presence of prosthetic heart valve: Secondary | ICD-10-CM | POA: Diagnosis not present

## 2018-08-01 DIAGNOSIS — Z7901 Long term (current) use of anticoagulants: Secondary | ICD-10-CM

## 2018-08-01 LAB — POCT INR: INR: 3.9 — AB (ref 2.0–3.0)

## 2018-08-01 NOTE — Telephone Encounter (Signed)
Spoke to wife. She is aware of  The time change for echo and building - [address]

## 2018-08-02 ENCOUNTER — Other Ambulatory Visit: Payer: Self-pay

## 2018-08-02 ENCOUNTER — Ambulatory Visit (HOSPITAL_COMMUNITY): Payer: BC Managed Care – PPO | Attending: Cardiology

## 2018-08-02 DIAGNOSIS — Z952 Presence of prosthetic heart valve: Secondary | ICD-10-CM | POA: Insufficient documentation

## 2018-08-02 DIAGNOSIS — I5022 Chronic systolic (congestive) heart failure: Secondary | ICD-10-CM | POA: Diagnosis not present

## 2018-08-07 ENCOUNTER — Telehealth: Payer: Self-pay | Admitting: Internal Medicine

## 2018-08-07 NOTE — Telephone Encounter (Signed)
Smartphone/video-- call Cell [phone] pre-reg complete, mychart active, verbal consent given 08/07/2018 MS

## 2018-08-09 ENCOUNTER — Telehealth: Payer: Self-pay

## 2018-08-09 ENCOUNTER — Telehealth (INDEPENDENT_AMBULATORY_CARE_PROVIDER_SITE_OTHER): Payer: BC Managed Care – PPO | Admitting: Internal Medicine

## 2018-08-09 ENCOUNTER — Encounter: Payer: Self-pay | Admitting: Internal Medicine

## 2018-08-09 VITALS — BP 166/76 | HR 60 | Wt 129.5 lb

## 2018-08-09 DIAGNOSIS — I5031 Acute diastolic (congestive) heart failure: Secondary | ICD-10-CM | POA: Diagnosis not present

## 2018-08-09 DIAGNOSIS — I10 Essential (primary) hypertension: Secondary | ICD-10-CM

## 2018-08-09 DIAGNOSIS — Z952 Presence of prosthetic heart valve: Secondary | ICD-10-CM | POA: Diagnosis not present

## 2018-08-09 DIAGNOSIS — N183 Chronic kidney disease, stage 3 unspecified: Secondary | ICD-10-CM

## 2018-08-09 NOTE — Patient Instructions (Signed)
Medication Instructions:  Continue current medications If you need a refill on your cardiac medications before your next appointment, please call your pharmacy.    Follow-Up: At [facility], you and your health needs are our priority.  As part of our continuing mission to provide you with exceptional heart care, we have created designated Provider Care Teams.  These Care Teams include your primary Cardiologist (physician) and Advanced Practice Providers (APPs -  Physician Assistants and Nurse Practitioners) who all work together to provide you with the care you need, when you need it. You will need a follow up appointment in 3 months.  You may see Kenneth C Hilty, MD or one of the following Advanced Practice Providers on your designated Care Team: Hao Meng, PA-C . Angela Duke, PA-C  Any Other Special Instructions Will Be Listed Below (If Applicable).    

## 2018-08-09 NOTE — Progress Notes (Signed)
Virtual Visit via Video Note   This visit type was conducted due to national recommendations for restrictions regarding the COVID-19 Pandemic (e.g. social distancing) in an effort to limit this patient's exposure and mitigate transmission in our community.  Due to his co-morbid illnesses, this patient is at least at moderate risk for complications without adequate follow up.  This format is felt to be most appropriate for this patient at this time.  All issues noted in this document were discussed and addressed.  A limited physical exam was performed with this format.  Please refer to the patient's chart for his consent to telehealth for [facility].   Evaluation Performed:  Video visit  Date:  08/09/2018   ID:  Robert [name], DOB Oct 25, 1940, MRN [id number]  Patient Location:  [place] [address]  Provider location:   [address], [place] [address] [place], [place] [address]  PCP:  Burman Freestone, MD  Cardiologist:  Pixie Casino, MD Electrophysiologist:  None   Chief Complaint:  Feels better  History of Present Illness:    Robert [name] is a 78 y.o. male who presents via audio/video conferencing for a telehealth visit today.  Robert [name] was seen today for video follow-up.  I last saw him via virtual visit he had noted that he had increased swelling and shortness of breath.  Lab work indicated congestive heart failure with an elevated BNP over 4000.  I doubled his Lasix dose to 40 mg twice daily.  Since then he has had about 4 pound weight loss and significant improvement in his edema as well as his shortness of breath.  A repeat echo was performed which I personally reviewed and showed normal systolic function with diastolic dysfunction and now moderate aortic regurgitation as well as moderate to severe pulmonic regurgitation.  His mitral prosthetic valve is functioning normally.  I shared those results with him today and feel that most likely his decompensated  heart failure was related to worsening aortic regurgitation on top of chronic diastolic dysfunction.  The patient does not have symptoms concerning for COVID-19 infection (fever, chills, cough, or new SHORTNESS OF BREATH).    Prior CV studies:   The following studies were reviewed today:  Chart reviewed Lab work  PMHx:  Past Medical History:  Diagnosis Date  . BPH (benign prostatic hyperplasia)   . Chronic anticoagulation    coumadin for mechanical valves  . CKD (chronic kidney disease), stage III ([place])   . Diabetes mellitus type 2, diet-controlled ([place])   . Gastric cancer ([place]) 2005   s/p gastrectomy  . High cholesterol   . HOH (hard of hearing)   . Hypertension   . Rheumatic heart disease   . S/P MVR (mitral valve replacement) 2015   24mm StJude mechanical valve  . Stroke [facility])     Past Surgical History:  Procedure Laterality Date  . CARDIAC CATHETERIZATION N/A 05/10/2015   Procedure: Right/Left Heart Cath and Coronary Angiography;  Surgeon: Burnell Blanks, MD;  Location: [facility];  Service: Cardiovascular;  Laterality: N/A;  . HERNIA REPAIR    . IR ANGIO INTRA EXTRACRAN SEL COM CAROTID INNOMINATE BILAT MOD SED  12/01/2016  . IR ANGIO VERTEBRAL SEL SUBCLAVIAN INNOMINATE UNI R MOD SED  12/01/2016  . MITRAL VALVE REPLACEMENT  10/24/2013   48mm StJude mechanical valve  . PARTIAL GASTRECTOMY  2005    FAMHx:  Family History  Problem Relation Age of Onset  . Hypertension  Mother   . Diabetes Mother   . Cancer Brother     SOCHx:   reports that he has never smoked. He has never used smokeless tobacco. He reports that he does not drink alcohol or use drugs.  ALLERGIES:  Allergies  Allergen Reactions  . Penicillins Nausea And Vomiting    Has patient had a PCN reaction causing immediate rash, facial/tongue/throat swelling, SOB or lightheadedness with hypotension: No Has patient had a PCN reaction causing severe rash involving mucus membranes or skin  necrosis: No Has patient had a PCN reaction that required hospitalization: Unknown Has patient had a PCN reaction occurring within the last 10 years: No If all of the above answers are "NO", then may proceed with Cephalosporin use.  . Vancomycin Other (See Comments)    Kidney problems    MEDS:  Current Meds  Medication Sig  . aspirin EC 81 MG tablet Take 81 mg by mouth daily.  Marland Kitchen atorvastatin (LIPITOR) 20 MG tablet TAKE 1 TABLET BY MOUTH EVERYDAY AT BEDTIME  . calcium carbonate (OSCAL) 1500 (600 Ca) MG TABS tablet Take 1,500 mg by mouth daily with breakfast.  . carvedilol (COREG) 12.5 MG tablet Take 1 tablet (12.5 mg total) by mouth 2 (two) times daily with a meal.  . chlorhexidine (PERIDEX) 0.12 % solution by Mouth Rinse route See admin instructions. Swish and spit small amount by mouth daily at bedtime  . ferrous sulfate 325 (65 FE) MG tablet Take 1 tablet (325 mg total) by mouth 2 (two) times daily with a meal.  . furosemide (LASIX) 40 MG tablet Take 1 tablet (40 mg total) by mouth 2 (two) times daily.  . hydrALAZINE (APRESOLINE) 25 MG tablet Take 25 mg by mouth 2 (two) times daily.  . isosorbide mononitrate (IMDUR) 30 MG 24 hr tablet TAKE 1 TABLET BY MOUTH EVERY DAY  . Multiple Vitamin (MULTIVITAMIN WITH MINERALS) TABS tablet Take 1 tablet by mouth daily.  . tamsulosin (FLOMAX) 0.4 MG CAPS capsule Take 0.8 mg by mouth daily.  . Thiamine Mononitrate (VITAMIN B1 PO) Take 1 tablet by mouth daily.   . vitamin B-12 (CYANOCOBALAMIN) 1000 MCG tablet Take 1 tablet (1,000 mcg total) by mouth daily.  Marland Kitchen warfarin (COUMADIN) 3 MG tablet TAKE 1 TO 1 & 1/2 TABLETS BY MOUTH DAILY AS DIRECTED BY COUMADIN CLINIC     ROS: Pertinent items noted in HPI and remainder of comprehensive ROS otherwise negative.  Labs/Other Tests and Data Reviewed:    Recent Labs: 09/12/2017: Magnesium 2.7 07/16/2018: ALT 28; BUN 17; Creatinine, Ser 1.68; NT-Pro BNP 4,358; Potassium 5.2; Sodium 140   Recent Lipid Panel Lab  Results  Component Value Date/Time   CHOL 125 12/02/2016 02:36 AM   TRIG 27 12/02/2016 02:36 AM   HDL 45 12/02/2016 02:36 AM   CHOLHDL 2.8 12/02/2016 02:36 AM   LDLCALC 75 12/02/2016 02:36 AM    Wt Readings from Last 3 Encounters:  08/09/18 129 lb 8 oz (58.7 kg)  07/15/18 132 lb (59.9 kg)  11/27/17 131 lb 9.6 oz (59.7 kg)     Exam:    Vital Signs:  BP (!) 166/76   Pulse 60   Wt 129 lb 8 oz (58.7 kg)   BMI 20.90 kg/m    General appearance: alert and no distress Lungs: no audible wheezing Abdomen: normal weight Extremities: extremities normal, atraumatic, no cyanosis or edema Skin: Skin color, texture, turgor normal. No rashes or lesions Neurologic: Mental status: Alert, oriented, thought content appropriate Psych: Pleasant  ASSESSMENT & PLAN:    1. Acute on chronic diastolic congestive heart failure 2. Status post mechanical mitral valve replacement 3. Moderate aortic regurgitation 4. Moderate to severe pulmonic regurgitation  Robert [name] has improved with regards to his shortness of breath and edema.  His BNP was quite elevated.  I would recommend he stays on Lasix 40 mg twice daily going forward.  His creatinine actually improved somewhat with diuresis.  He does have moderate aortic regurgitation which likely is contributing to his heart failure in the setting of diastolic dysfunction.  His mechanical mitral valve is functioning properly and LVEF is low normal at 50 to 55%.  Plan follow-up with me in 3 months.  COVID-19 Education: The signs and symptoms of COVID-19 were discussed with the patient and how to seek care for testing (follow up with PCP or arrange E-visit).  The importance of social distancing was discussed today.  Patient Risk:   After full review of this patients clinical status, I feel that they are at least moderate risk at this time.  Time:   Today, I have spent 15 minutes with the patient with telehealth technology discussing diastolic heart failure, echo  findings, medication management.     Medication Adjustments/Labs and Tests Ordered: Current medicines are reviewed at length with the patient today.  Concerns regarding medicines are outlined above.   Tests Ordered: No orders of the defined types were placed in this encounter.   Medication Changes: No orders of the defined types were placed in this encounter.   Disposition:  in 3 month(s)  Pixie Casino, MD, [facility], [place] Director of the Advanced Lipid Disorders &  Cardiovascular Risk Reduction Clinic Diplomate of the American Board of Clinical Lipidology Attending Cardiologist  Direct Dial: [phone]  Fax: [phone]  Website:  [email or web address]  Pixie Casino, MD  08/09/2018 11:11 AM

## 2018-08-09 NOTE — Telephone Encounter (Signed)

## 2018-08-13 ENCOUNTER — Ambulatory Visit (INDEPENDENT_AMBULATORY_CARE_PROVIDER_SITE_OTHER): Payer: BC Managed Care – PPO | Admitting: Pharmacist Clinician (PhC)/ Clinical Pharmacy Specialist

## 2018-08-13 ENCOUNTER — Other Ambulatory Visit: Payer: Self-pay

## 2018-08-13 DIAGNOSIS — I63412 Cerebral infarction due to embolism of left middle cerebral artery: Secondary | ICD-10-CM | POA: Diagnosis not present

## 2018-08-13 DIAGNOSIS — Z7901 Long term (current) use of anticoagulants: Secondary | ICD-10-CM

## 2018-08-13 DIAGNOSIS — Z952 Presence of prosthetic heart valve: Secondary | ICD-10-CM

## 2018-08-13 LAB — POCT INR: INR: 2.8 (ref 2.0–3.0)

## 2018-08-13 NOTE — Patient Instructions (Signed)
Take 2 tablets today Tuesday June 2, then continue taking 1.5 tablets daily except 1 tablet on Mondays, Wednesdays and Fridays. Repeat INR in 3 weeks.

## 2018-08-15 ENCOUNTER — Telehealth: Payer: Self-pay | Admitting: Internal Medicine

## 2018-08-15 DIAGNOSIS — I351 Nonrheumatic aortic (valve) insufficiency: Secondary | ICD-10-CM

## 2018-08-15 DIAGNOSIS — I371 Nonrheumatic pulmonary valve insufficiency: Secondary | ICD-10-CM

## 2018-08-15 DIAGNOSIS — Z952 Presence of prosthetic heart valve: Secondary | ICD-10-CM

## 2018-08-15 NOTE — Telephone Encounter (Signed)
Spoke with wife who agreed with referral to Dr. Evelina Dun. Message sent to scheduler

## 2018-08-15 NOTE — Telephone Encounter (Signed)
Referral order entered.   LM for wife to return call.

## 2018-08-15 NOTE — Telephone Encounter (Signed)
  Wife is returning call 

## 2018-08-15 NOTE — Telephone Encounter (Signed)
New Message    Pts wife is calling and is wanting to get a second opinion at [facility], they need a referral     Please call

## 2018-08-15 NOTE — Telephone Encounter (Signed)
Routed to MD

## 2018-08-15 NOTE — Telephone Encounter (Signed)
Ok .. would refer back to Dr. Lajuana Matte at [facility] who operated on him before.  Dr Lemmie Evens

## 2018-08-22 ENCOUNTER — Telehealth: Payer: Self-pay | Admitting: Internal Medicine

## 2018-08-22 NOTE — Telephone Encounter (Signed)
Message routed to NL scheduling concerning referral to Dr. Evelina Dun with [facility]

## 2018-08-22 NOTE — Telephone Encounter (Signed)
1. What dental office are you calling from? [facility]   2. What is your office phone number? [phone]  3. What is your fax number? [phone]  4. What type of procedure is the patient having performed? Tooth extraction   5. What date is procedure scheduled or is the patient there now? 08/26/18 (if the patient is at the dentist's office question goes to their cardiologist if he/she is in the office.  If not, question should go to the DOD).   6. What is your question (ex. Antibiotics prior to procedure, holding medication-we need to know how long dentist wants pt to hold med)? Blood thinners and hold long

## 2018-08-22 NOTE — Telephone Encounter (Signed)
New Message    Pts wife called to ask if referral was sent. I do show it was sent, I reached out to      Dr Marylene Land office and they have not received it and will need it resent.

## 2018-08-22 NOTE — Telephone Encounter (Signed)
Spoke to pt's wife who report she spoke to someone from Dr. Terrial Rhodes office and they have not received the referral. Informed pt that referral was sent on 6/4 but wife is insisting they did not receive it. Will route message to MD's nurse.

## 2018-08-23 MED ORDER — AMOXICILLIN 500 MG PO TABS: ORAL_TABLET | ORAL | 2 refills | Status: DC

## 2018-08-23 MED ORDER — AMOXICILLIN 500 MG PO TABS: 500.0000 mg | ORAL_TABLET | Freq: Two times a day (BID) | ORAL | 2 refills | Status: DC

## 2018-08-23 NOTE — Telephone Encounter (Signed)
Spoke to Mrs. Zumbro, per pt.  She is aware that since pt tolerated Amoxicillin, that we have changed the Clindamycin to Amoxicillin to 500 mg taking 4 tablets by mouth 30-60 mins prior to dental procedure.  Will also route to requesting physician to make them aware

## 2018-08-23 NOTE — Telephone Encounter (Signed)
Spoke to pt's wife, per ok by pt.  She has been made aware that pt must take Clindamycin 600 mg 30-60 mins before dental procedure.  She advises me that pt has been on Amoxicillin 500 mg tid for 10 days, with today being his lat day to take it, and wants to know if pt still needed to take the Clindamycin on the day of? Advised her I would check with the provider and let her know.

## 2018-08-23 NOTE — Telephone Encounter (Signed)
Follow up    Dr Jillyn Ledger  Office has not gotten the referral and would like it to be faxed   Fax [phone]  Please fax

## 2018-08-23 NOTE — Telephone Encounter (Signed)
Routed to PCC pool.

## 2018-08-23 NOTE — Telephone Encounter (Signed)
   Primary Cardiologist: Pixie Casino, MD  Chart reviewed as part of pre-operative protocol coverage. Simple dental extractions are considered low risk procedures per guidelines and generally do not require any specific cardiac clearance. It is also generally accepted that for simple extractions and dental cleanings, there is no need to interrupt blood thinner therapy. Local, non-systemic methods of hemostasis are instead preferred. This patient has a mechanical mitral valve replacement therefore stopping blood thinner therapy would be associated with increased risk of stroke.   SBE prophylaxis is required for the patient. Discussed best options with clinic pharmacist given listed allergies - would recommend clindamycin 600mg  orally 30-60 minutes before dental work. Will route to callback staff to call patient to find out where patient wants rx sent into - would send in as two 300mg  capsules by mouth 30-60 minutes prior to procedure, disp #2 with 2 refills.  I will otherwise route this recommendation to the requesting party via Epic fax function and remove from pre-op pool.  Please call with questions.  Charlie Pitter, PA-C 08/23/2018, 8:01 AM

## 2018-08-23 NOTE — Telephone Encounter (Signed)
Great question. The antibiotic must be taken directly before the dental procedure, so just because he had been on it before does not change this recommendation. HOWEVER, it's interesting to note he tolerated amoxicillin since his chart carries a penicillin allergy. That tells me he might have had an intolerance previously, not an allergy. If he did OK with amoxicillin, instead of clindamycin, would prescribe amoxicillin 500mg  take 4 tablets by mouth 30-60 minutes prior to dental procedure disp #4 with 2 refills. Also if he did OK with amoxicillin, would add that note to his allergies that he "tolerated amoxicllin" so we know for future reference. Dayna Dunn PA-C

## 2018-08-26 ENCOUNTER — Telehealth: Payer: Self-pay

## 2018-08-26 NOTE — Telephone Encounter (Signed)
Pt's wife called stating that the pt had a tooth extraction and that it's still bleeding. Erasmo Downer was in a mtg but told me to instruct the pt to insert a tea bag on to the affected are then close their mouth and place ice on the outside of the cheek. I also reminded the pt of his appt and did the covid screen . 1. COVID-19 Pre-Screening Questions:  . In the past 7 to 10 days have you had a cough,  shortness of breath, headache, congestion, fever (100 or greater) body aches, chills, sore throat, or sudden loss of taste or sense of smell? no . Have you been around anyone with known Covid 19.  no . Have you been around anyone who is awaiting Covid 19 test results in the past 7 to 10 days?  no . Have you been around anyone who has been exposed to Covid 19, or has mentioned symptoms of Covid 19 within the past 7 to 10 days?  no    2. Pt advised of visitor restrictions (no visitors allowed except if needed to conduct the visit). Also advised to arrive at appointment time and wear a mask.

## 2018-08-27 ENCOUNTER — Telehealth: Payer: Self-pay | Admitting: Internal Medicine

## 2018-08-27 NOTE — Telephone Encounter (Signed)
Will forward to the echocardiogram department

## 2018-08-27 NOTE — Telephone Encounter (Signed)
Gerald Stabs, a CardioThoracic Surgeon at [facility] would like the digital images from the patient's latest Echo done on 05/22.  You can e-mail those images to her at  [email or web address]

## 2018-08-28 ENCOUNTER — Telehealth: Payer: Self-pay

## 2018-08-28 NOTE — Telephone Encounter (Signed)
Cd of echo received from echo dept. Routing to NL office. 08/28/18 vlm

## 2018-08-28 NOTE — Telephone Encounter (Signed)
Called and spoke with Robert [name] at [place]. Informed her at this time we can not email images. We will try to [facility] the images later today. If unable, we have given a CD to medical records.

## 2018-09-02 ENCOUNTER — Other Ambulatory Visit: Payer: Self-pay

## 2018-09-02 ENCOUNTER — Ambulatory Visit (INDEPENDENT_AMBULATORY_CARE_PROVIDER_SITE_OTHER): Payer: BC Managed Care – PPO | Admitting: *Deleted

## 2018-09-02 DIAGNOSIS — Z7901 Long term (current) use of anticoagulants: Secondary | ICD-10-CM

## 2018-09-02 DIAGNOSIS — Z952 Presence of prosthetic heart valve: Secondary | ICD-10-CM | POA: Diagnosis not present

## 2018-09-02 LAB — POCT INR: INR: 4.7 — AB (ref 2.0–3.0)

## 2018-09-02 NOTE — Patient Instructions (Signed)
Description   Skip today's dose, tomorrow only take 1/2 tablet, then continue taking 1.5 tablets daily except 1 tablet on Mondays, Wednesdays and Fridays. Repeat INR in 10 days.

## 2018-09-04 ENCOUNTER — Telehealth: Payer: Self-pay

## 2018-09-04 ENCOUNTER — Telehealth: Payer: Self-pay | Admitting: Internal Medicine

## 2018-09-04 NOTE — Telephone Encounter (Signed)
Called to reschedule their coumadin appt on7/2 for an earlier time and the pt was compliant

## 2018-09-04 NOTE — Telephone Encounter (Signed)
New message     Pt c/o BP issue: STAT if pt c/o blurred vision, one-sided weakness or slurred speech  1. What are your last 5 BP readings? 91/46 b/p hr 48 hr  85/45 b/p 48  2. Are you having any other symptoms (ex. Dizziness, headache, blurred vision, passed out)? Dizziness   3. What is your BP issue? Patient's wife states that his b/p is low

## 2018-09-04 NOTE — Telephone Encounter (Signed)
Thanks Erasmo Downer - I agree. The weight loss is concerning - I hope it was just the diuretic.  Dr. Lemmie Evens

## 2018-09-04 NOTE — Telephone Encounter (Signed)
Pt wife called to ask about husband BP.   She notes that he has lost 8-10 pounds in the past 2 weeks and she cut his lasix back to 40 mg qd several days ago.  Advised today that she hold hydralazine 25mg  dose when BP < 111 systolic, and cut the carvedilol from 12.5 mg bid to 6.25 mg bid.  They should monitor for a week or two and see if improvement.    Will forward to Dr. Debara Pickett to contact patient with any further recommendations.

## 2018-09-05 NOTE — Telephone Encounter (Signed)

## 2018-09-12 ENCOUNTER — Other Ambulatory Visit: Payer: Self-pay

## 2018-09-12 ENCOUNTER — Ambulatory Visit (INDEPENDENT_AMBULATORY_CARE_PROVIDER_SITE_OTHER): Payer: BC Managed Care – PPO

## 2018-09-12 DIAGNOSIS — Z952 Presence of prosthetic heart valve: Secondary | ICD-10-CM | POA: Diagnosis not present

## 2018-09-12 DIAGNOSIS — Z7901 Long term (current) use of anticoagulants: Secondary | ICD-10-CM | POA: Diagnosis not present

## 2018-09-12 LAB — POCT INR: INR: 5 — AB (ref 2.0–3.0)

## 2018-09-12 MED ORDER — WARFARIN SODIUM 3 MG PO TABS: ORAL_TABLET | ORAL | 1 refills | Status: DC

## 2018-09-12 NOTE — Patient Instructions (Signed)
Description   Skip today and tomorrow's dosage of Coumadin, then start taking 1 tablet daily except 1.5 tablets on Mondays, Wednesdays and Fridays. Repeat INR in 10 days.

## 2018-09-17 ENCOUNTER — Telehealth: Payer: Self-pay

## 2018-09-17 NOTE — Telephone Encounter (Signed)

## 2018-09-23 ENCOUNTER — Ambulatory Visit (INDEPENDENT_AMBULATORY_CARE_PROVIDER_SITE_OTHER): Payer: BC Managed Care – PPO | Admitting: Pharmacist Clinician (PhC)/ Clinical Pharmacy Specialist

## 2018-09-23 ENCOUNTER — Other Ambulatory Visit: Payer: Self-pay

## 2018-09-23 DIAGNOSIS — Z7901 Long term (current) use of anticoagulants: Secondary | ICD-10-CM | POA: Diagnosis not present

## 2018-09-23 DIAGNOSIS — I63412 Cerebral infarction due to embolism of left middle cerebral artery: Secondary | ICD-10-CM

## 2018-09-23 DIAGNOSIS — Z952 Presence of prosthetic heart valve: Secondary | ICD-10-CM

## 2018-09-23 LAB — POCT INR: INR: 3.8 — AB (ref 2.0–3.0)

## 2018-09-25 ENCOUNTER — Other Ambulatory Visit: Payer: Self-pay | Admitting: Internal Medicine

## 2018-09-30 ENCOUNTER — Telehealth: Payer: Self-pay

## 2018-09-30 NOTE — Telephone Encounter (Signed)
lmom for prescreen  

## 2018-10-02 ENCOUNTER — Ambulatory Visit (INDEPENDENT_AMBULATORY_CARE_PROVIDER_SITE_OTHER): Payer: BC Managed Care – PPO | Admitting: Pharmacist Clinician (PhC)/ Clinical Pharmacy Specialist

## 2018-10-02 ENCOUNTER — Other Ambulatory Visit: Payer: Self-pay

## 2018-10-02 DIAGNOSIS — Z952 Presence of prosthetic heart valve: Secondary | ICD-10-CM | POA: Diagnosis not present

## 2018-10-02 DIAGNOSIS — Z7901 Long term (current) use of anticoagulants: Secondary | ICD-10-CM

## 2018-10-02 LAB — POCT INR: INR: 4.5 — AB (ref 2.0–3.0)

## 2018-10-06 ENCOUNTER — Other Ambulatory Visit: Payer: Self-pay | Admitting: Internal Medicine

## 2018-10-09 ENCOUNTER — Ambulatory Visit (INDEPENDENT_AMBULATORY_CARE_PROVIDER_SITE_OTHER): Payer: BC Managed Care – PPO | Admitting: Pharmacist

## 2018-10-09 ENCOUNTER — Other Ambulatory Visit: Payer: Self-pay

## 2018-10-09 DIAGNOSIS — Z952 Presence of prosthetic heart valve: Secondary | ICD-10-CM

## 2018-10-09 DIAGNOSIS — Z7901 Long term (current) use of anticoagulants: Secondary | ICD-10-CM

## 2018-10-09 LAB — POCT INR: INR: 3.5 — AB (ref 2.0–3.0)

## 2018-10-09 MED ORDER — ISOSORBIDE MONONITRATE ER 30 MG PO TB24: ORAL_TABLET | ORAL | 0 refills | Status: DC

## 2018-10-09 MED ORDER — CARVEDILOL 12.5 MG PO TABS: 6.2500 mg | ORAL_TABLET | Freq: Two times a day (BID) | ORAL | 1 refills | Status: DC

## 2018-10-09 MED ORDER — FUROSEMIDE 40 MG PO TABS: 20.0000 mg | ORAL_TABLET | Freq: Every day | ORAL | 1 refills | Status: DC

## 2018-10-31 ENCOUNTER — Ambulatory Visit (INDEPENDENT_AMBULATORY_CARE_PROVIDER_SITE_OTHER): Payer: BC Managed Care – PPO | Admitting: Pharmacist Clinician (PhC)/ Clinical Pharmacy Specialist

## 2018-10-31 ENCOUNTER — Other Ambulatory Visit: Payer: Self-pay

## 2018-10-31 DIAGNOSIS — Z952 Presence of prosthetic heart valve: Secondary | ICD-10-CM | POA: Diagnosis not present

## 2018-10-31 DIAGNOSIS — Z7901 Long term (current) use of anticoagulants: Secondary | ICD-10-CM

## 2018-10-31 LAB — POCT INR: INR: 2.2 (ref 2.0–3.0)

## 2018-10-31 NOTE — Patient Instructions (Signed)
Take 1.5 tablets today Thursday Aug 20, then increase dose to 1 tablet daily except 1.5 tablets each Monday,  Repeat INR on Sept 1 with Dr. Debara Pickett appt

## 2018-11-11 ENCOUNTER — Other Ambulatory Visit: Payer: Self-pay | Admitting: Internal Medicine

## 2018-11-12 ENCOUNTER — Ambulatory Visit: Payer: BC Managed Care – PPO | Admitting: Internal Medicine

## 2018-11-14 ENCOUNTER — Other Ambulatory Visit: Payer: Self-pay

## 2018-11-14 ENCOUNTER — Ambulatory Visit (INDEPENDENT_AMBULATORY_CARE_PROVIDER_SITE_OTHER): Payer: BC Managed Care – PPO | Admitting: Pharmacist Clinician (PhC)/ Clinical Pharmacy Specialist

## 2018-11-14 DIAGNOSIS — Z952 Presence of prosthetic heart valve: Secondary | ICD-10-CM | POA: Diagnosis not present

## 2018-11-14 DIAGNOSIS — Z7901 Long term (current) use of anticoagulants: Secondary | ICD-10-CM | POA: Diagnosis not present

## 2018-11-14 LAB — POCT INR: INR: 2.9 (ref 2.0–3.0)

## 2018-11-29 ENCOUNTER — Other Ambulatory Visit: Payer: Self-pay

## 2018-11-29 ENCOUNTER — Ambulatory Visit (INDEPENDENT_AMBULATORY_CARE_PROVIDER_SITE_OTHER): Payer: BC Managed Care – PPO | Admitting: Pharmacist Clinician (PhC)/ Clinical Pharmacy Specialist

## 2018-11-29 ENCOUNTER — Ambulatory Visit: Payer: BC Managed Care – PPO | Admitting: Medical

## 2018-11-29 ENCOUNTER — Encounter: Payer: Self-pay | Admitting: Medical

## 2018-11-29 VITALS — BP 154/76 | HR 58 | Ht 66.0 in | Wt 125.0 lb

## 2018-11-29 DIAGNOSIS — Z7901 Long term (current) use of anticoagulants: Secondary | ICD-10-CM

## 2018-11-29 DIAGNOSIS — I371 Nonrheumatic pulmonary valve insufficiency: Secondary | ICD-10-CM | POA: Diagnosis not present

## 2018-11-29 DIAGNOSIS — I10 Essential (primary) hypertension: Secondary | ICD-10-CM | POA: Diagnosis not present

## 2018-11-29 DIAGNOSIS — I351 Nonrheumatic aortic (valve) insufficiency: Secondary | ICD-10-CM | POA: Diagnosis not present

## 2018-11-29 DIAGNOSIS — E785 Hyperlipidemia, unspecified: Secondary | ICD-10-CM

## 2018-11-29 DIAGNOSIS — I5032 Chronic diastolic (congestive) heart failure: Secondary | ICD-10-CM | POA: Diagnosis not present

## 2018-11-29 DIAGNOSIS — I2510 Atherosclerotic heart disease of native coronary artery without angina pectoris: Secondary | ICD-10-CM

## 2018-11-29 DIAGNOSIS — N183 Chronic kidney disease, stage 3 unspecified: Secondary | ICD-10-CM

## 2018-11-29 DIAGNOSIS — Z952 Presence of prosthetic heart valve: Secondary | ICD-10-CM

## 2018-11-29 LAB — POCT INR: INR: 4.2 — AB (ref 2.0–3.0)

## 2018-11-29 MED ORDER — HYDRALAZINE HCL 50 MG PO TABS: 50.0000 mg | ORAL_TABLET | Freq: Two times a day (BID) | ORAL | 3 refills | Status: DC

## 2018-11-29 NOTE — Patient Instructions (Signed)
Take only 1/2 tablet today Friday Sept 18, then take 1 tablet daily except 1.5 tablets each Monday.  Repeat INR in 2 weeks

## 2018-11-29 NOTE — Progress Notes (Signed)
Cardiology Office Note   Date:  11/29/2018   ID:  Robert [name], DOB [birth year], MRN [id number]  PCP:  Robert Freestone, MD  Cardiologist:  Pixie Casino, MD EP: None  Chief Complaint  Patient presents with  . Follow-up      History of Present Illness: Robert [name] is a 78 y.o. male with a PMH of chronic diastolic CHF, rheumatic heart disease s/p mechanical MVR, moderate to severe pulmonary valve regurgitation, moderate aortic regurgitation, non-obstructive CAD, HTN, HLD, DM type 2, CKD stage 3, who presents for follow-up of his CHF.  He was last evaluated by cardiology via a telemedicine visit with Dr. Debara Pickett 07/2018, at which time he reported improvement in his SOB and edema after a recent episode of acute on chronic diastolic CHF. He was recommended to continue lasix 40mg  BID, however his dose was adjusted throughout the summer in response to weight loss and hypotension. His last echocardiogram 07/2018 showed EF 50-55%, G1DD, normally functioning mechanical mitral valve, moderate TR, moderate AI, and moderate to severe PI.   He returns today and reports he has been doing well since his last visit. He reports swelling has been minimal. Weights have been stable. He denies chest pain, SOB, bleeding, dizziness, lightheadedness, or syncope. He reports SBP typically in the 140s.   Past Medical History:  Diagnosis Date  . BPH (benign prostatic hyperplasia)   . Chronic anticoagulation    coumadin for mechanical valves  . CKD (chronic kidney disease), stage III ([place])   . Diabetes mellitus type 2, diet-controlled ([place])   . Gastric cancer ([place]) 2005   s/p gastrectomy  . High cholesterol   . HOH (hard of hearing)   . Hypertension   . Rheumatic heart disease   . S/P MVR (mitral valve replacement) 2015   4mm StJude mechanical valve  . Stroke [facility])     Past Surgical History:  Procedure Laterality Date  . CARDIAC CATHETERIZATION N/A 05/10/2015   Procedure: Right/Left Heart  Cath and Coronary Angiography;  Surgeon: Burnell Blanks, MD;  Location: [place] CV LAB;  Service: Cardiovascular;  Laterality: N/A;  . HERNIA REPAIR    . IR ANGIO INTRA EXTRACRAN SEL COM CAROTID INNOMINATE BILAT MOD SED  12/01/2016  . IR ANGIO VERTEBRAL SEL SUBCLAVIAN INNOMINATE UNI R MOD SED  12/01/2016  . MITRAL VALVE REPLACEMENT  10/24/2013   48mm StJude mechanical valve  . PARTIAL GASTRECTOMY  2005     Current Outpatient Medications  Medication Sig Dispense Refill  . amoxicillin (AMOXIL) 500 MG tablet Take 4 tablets by mouth 30-60 mins prior to dental procedure 4 tablet 2  . aspirin EC 81 MG tablet Take 81 mg by mouth daily.    Marland Kitchen atorvastatin (LIPITOR) 20 MG tablet TAKE 1 TABLET BY MOUTH EVERYDAY AT BEDTIME 60 tablet 0  . calcium carbonate (OSCAL) 1500 (600 Ca) MG TABS tablet Take 1,500 mg by mouth daily with breakfast.    . carvedilol (COREG) 12.5 MG tablet Take 0.5 tablets (6.25 mg total) by mouth 2 (two) times daily with a meal. 30 tablet 1  . ferrous sulfate 325 (65 FE) MG tablet Take 1 tablet (325 mg total) by mouth 2 (two) times daily with a meal. 30 tablet 3  . furosemide (LASIX) 40 MG tablet Take 0.5 tablets (20 mg total) by mouth daily. 30 tablet 1  . hydrALAZINE (APRESOLINE) 50 MG tablet Take 1 tablet (50 mg total) by mouth 2 (two) times daily. 180 tablet  3  . isosorbide mononitrate (IMDUR) 30 MG 24 hr tablet Take 1 tablet as needed for BP < 110 90 tablet 0  . Multiple Vitamin (MULTIVITAMIN WITH MINERALS) TABS tablet Take 1 tablet by mouth daily.    . tamsulosin (FLOMAX) 0.4 MG CAPS capsule Take 0.8 mg by mouth daily.    . Thiamine Mononitrate (VITAMIN B1 PO) Take 1 tablet by mouth daily.     . vitamin B-12 (CYANOCOBALAMIN) 1000 MCG tablet Take 1 tablet (1,000 mcg total) by mouth daily. 7 tablet 0  . warfarin (COUMADIN) 3 MG tablet TAKE 1 TO 1 & 1/2 TABLETS BY MOUTH DAILY AS DIRECTED BY COUMADIN CLINIC 120 tablet 1   No current facility-administered medications for  this visit.     Allergies:   Penicillins and Vancomycin    Social History:  The patient  reports that he has never smoked. He has never used smokeless tobacco. He reports that he does not drink alcohol or use drugs.   Family History:  The patient's family history includes Cancer in his brother; Diabetes in his mother; Hypertension in his mother.    ROS:  Please see the history of present illness.   Otherwise, review of systems are positive for none.   All other systems are reviewed and negative.    PHYSICAL EXAM: VS:  BP (!) 154/76   Pulse (!) 58   Ht 5\' 6"  (1.676 m)   Wt 125 lb (56.7 kg)   SpO2 98%   BMI 20.18 kg/m  , BMI Body mass index is 20.18 kg/m. GEN: Well nourished, well developed, in no acute distress HEENT: sclera anicteric Neck: no JVD, carotid bruits, or masses Cardiac: RRR; no murmurs, rubs, or gallops, trace LE edema above his sock   Respiratory:  clear to auscultation bilaterally, normal work of breathing GI: soft, nontender, nondistended, + BS MS: no deformity or atrophy Skin: warm and dry, no rash Neuro:  Strength and sensation are intact Psych: euthymic mood, full affect   EKG:  EKG is ordered today. The ekg ordered today demonstrates sinus bradycardia with rate 58 bpm, LVH with early repol, no STE/D   Recent Labs: 07/16/2018: ALT 28; BUN 17; Creatinine, Ser 1.68; NT-Pro BNP 4,358; Potassium 5.2; Sodium 140    Lipid Panel    Component Value Date/Time   CHOL 125 12/02/2016 0236   TRIG 27 12/02/2016 0236   HDL 45 12/02/2016 0236   CHOLHDL 2.8 12/02/2016 0236   VLDL 5 12/02/2016 0236   LDLCALC 75 12/02/2016 0236      Wt Readings from Last 3 Encounters:  11/29/18 125 lb (56.7 kg)  08/09/18 129 lb 8 oz (58.7 kg)  07/15/18 132 lb (59.9 kg)      Other studies Reviewed: Additional studies/ records that were reviewed today include:   Right/Left heart catheterization 2017:  Prox RCA lesion, 20% stenosed.  Prox LAD to Mid LAD lesion, 30%  stenosed.  Mid LAD lesion, 40% stenosed.  Ost 1st Diag to 1st Diag lesion, 50% stenosed.  Ost 2nd Diag lesion, 30% stenosed.   1. Mild to moderate non-obstructive CAD 2. Normal movement of mechanical mitral valve leaflets.  3. Elevated filling pressures suggesting residual volume overload.   Recommendations: Given his volume overload, would continue diuresis with IV Lasix. Resume coumadin tonight. Heparin drip to resume 8 hours post sheath pull. Medical management of CAD.   Echocardiogram 07/2018: 1. The left ventricle has low normal systolic function, with an ejection fraction of 50-55%. The cavity  size was normal. There is mildly increased left ventricular wall thickness. Left ventricular diastolic Doppler parameters are consistent with  impaired relaxation. No evidence of left ventricular regional wall motion abnormalities.  2. The right ventricle has normal systolic function. The cavity was moderately enlarged. There is no increase in right ventricular wall thickness.  3. Left atrial size was mildly dilated.  4. A 25 mm St. Jude mechanical valve is present in the mitral position. There is no Echo evidence of is functioning normally of the mitral prosthesis. Normal mitral valve prosthesis.  5. Mildly thickened tricuspid valve leaflets.  6. Tricuspid valve regurgitation is moderate.  7. Mild thickening of the aortic valve. Mild calcification of the aortic valve. Aortic valve regurgitation is moderate by color flow Doppler.  8. Pulmonic valve regurgitation is moderate to severe by color flow Doppler.   ASSESSMENT AND PLAN:  1. Chronic diastolic CHF: appears to be stable. No recent weight gain, SOB, or orthopnea. Trace LE edema and clear lungs on exam today. He has been taking lasix 20 mg daily - Continue lasix 20mg  daily - Continue carvedilol  2. Rheumatic heart disease c/b severe mitral regurgitation s/p mechanical MVR: valve stable on echo 07/2018.  - Continue to monitor with  routine echos - Seen by coumadin clinic today. INR 4.2 - recommendations for coumadin dosing given by pharmacy.  3. Valvulopathy: noted to have moderate TR, moderate AI, and moderate to severe PI on echo 07/2018. Volume status appears stable today. No complaints of SOB, chest pain, or syncope - Continue to monitor with routine echos  4. HTN: BP elevated to 154/76 today. He reports SBP is typically in the 140s. - Will increase hydralazine to 50mg  BID - Continue carvedilol, imdur, and lasix  5. Non-obstructive CAD: LHC in 2017 with mild to moderate CAD with 20% pRCA, 30% p-mLAD, 50% 1st diagonal, and 30% 2nd diagonal stenosis. No anginal complaints - Continue aspirin and statin - Continue carvedilol  6. HLD: last lipids in 2018 with LDL 75 - Continue statin    Current medicines are reviewed at length with the patient today.  The patient does not have concerns regarding medicines.  The following changes have been made:  Increase hydralazine to 50mg  BID  Labs/ tests ordered today include:   Orders Placed This Encounter  Procedures  . Basic metabolic panel  . EKG 12-Lead     Disposition:   FU with Dr. Debara Pickett in 6 months  Signed, Abigail Butts, PA-C  11/29/2018 9:25 PM

## 2018-11-29 NOTE — Patient Instructions (Addendum)
Medication Instructions:   INCREASE Hydralazine to 50 mg 2 times a day If you need a refill on your cardiac medications before your next appointment, please call your pharmacy.   Lab work: You will need to have labs (blood work) drawn today:  BMET If you have labs (blood work) drawn today and your tests are completely normal, you will receive your results only by: Marland Kitchen MyChart Message (if you have MyChart) OR . A paper copy in the mail If you have any lab test that is abnormal or we need to change your treatment, we will call you to review the results.  Testing/Procedures: NONE ordered at this time of appointment   Follow-Up: At [facility], you and your health needs are our priority.  As part of our continuing mission to provide you with exceptional heart care, we have created designated Provider Care Teams.  These Care Teams include your primary Cardiologist (physician) and Advanced Practice Providers (APPs -  Physician Assistants and Nurse Practitioners) who all work together to provide you with the care you need, when you need it. . You will need a follow up appointment in 6 months with Pixie Casino, MD   Any Other Special Instructions Will Be Listed Below (If Applicable).  Continue to monitor your blood pressure and weight at home, if your blood pressure continues to be greater than 130/80 call the office   Continue Low sodium diet

## 2018-11-30 ENCOUNTER — Other Ambulatory Visit: Payer: Self-pay | Admitting: Internal Medicine

## 2018-11-30 LAB — BASIC METABOLIC PANEL
BUN/Creatinine Ratio: 12 (ref 10–24)
BUN: 22 mg/dL (ref 8–27)
CO2: 26 mmol/L (ref 20–29)
Calcium: 8.8 mg/dL (ref 8.6–10.2)
Chloride: 105 mmol/L (ref 96–106)
Creatinine, Ser: 1.87 mg/dL — ABNORMAL HIGH (ref 0.76–1.27)
GFR calc Af Amer: 39 mL/min/{1.73_m2} — ABNORMAL LOW (ref >59)
GFR calc non Af Amer: 33 mL/min/{1.73_m2} — ABNORMAL LOW (ref >59)
Glucose: 119 mg/dL — ABNORMAL HIGH (ref 65–99)
Potassium: 4.7 mmol/L (ref 3.5–5.2)
Sodium: 142 mmol/L (ref 134–144)

## 2018-12-16 ENCOUNTER — Other Ambulatory Visit: Payer: Self-pay

## 2018-12-16 ENCOUNTER — Ambulatory Visit (INDEPENDENT_AMBULATORY_CARE_PROVIDER_SITE_OTHER): Payer: BC Managed Care – PPO | Admitting: Pharmacist

## 2018-12-16 DIAGNOSIS — Z7901 Long term (current) use of anticoagulants: Secondary | ICD-10-CM | POA: Diagnosis not present

## 2018-12-16 DIAGNOSIS — Z952 Presence of prosthetic heart valve: Secondary | ICD-10-CM | POA: Diagnosis not present

## 2018-12-16 LAB — POCT INR: INR: 1.9 — AB (ref 2.0–3.0)

## 2019-01-03 ENCOUNTER — Ambulatory Visit (INDEPENDENT_AMBULATORY_CARE_PROVIDER_SITE_OTHER): Payer: BC Managed Care – PPO | Admitting: Pharmacist Clinician (PhC)/ Clinical Pharmacy Specialist

## 2019-01-03 ENCOUNTER — Other Ambulatory Visit: Payer: Self-pay

## 2019-01-03 DIAGNOSIS — Z952 Presence of prosthetic heart valve: Secondary | ICD-10-CM

## 2019-01-03 DIAGNOSIS — Z7901 Long term (current) use of anticoagulants: Secondary | ICD-10-CM

## 2019-01-03 LAB — POCT INR: INR: 2.2 (ref 2.0–3.0)

## 2019-01-06 ENCOUNTER — Other Ambulatory Visit: Payer: Self-pay | Admitting: Internal Medicine

## 2019-01-06 NOTE — Telephone Encounter (Signed)
Refill request

## 2019-01-17 ENCOUNTER — Other Ambulatory Visit: Payer: Self-pay

## 2019-01-17 ENCOUNTER — Ambulatory Visit (INDEPENDENT_AMBULATORY_CARE_PROVIDER_SITE_OTHER): Payer: BC Managed Care – PPO | Admitting: Pharmacist Clinician (PhC)/ Clinical Pharmacy Specialist

## 2019-01-17 DIAGNOSIS — Z7901 Long term (current) use of anticoagulants: Secondary | ICD-10-CM

## 2019-01-17 DIAGNOSIS — Z952 Presence of prosthetic heart valve: Secondary | ICD-10-CM

## 2019-01-17 LAB — POCT INR: INR: 4.1 — AB (ref 2.0–3.0)

## 2019-01-17 NOTE — Patient Instructions (Signed)
Take only 1/2 tablet today Friday Nov 6, then take 1 tablet daily except 1.5 tablets each Monday and Friday.  Repeat INR in 2 weeks

## 2019-01-31 ENCOUNTER — Other Ambulatory Visit: Payer: Self-pay

## 2019-01-31 ENCOUNTER — Ambulatory Visit (INDEPENDENT_AMBULATORY_CARE_PROVIDER_SITE_OTHER): Payer: BC Managed Care – PPO | Admitting: Pharmacist Clinician (PhC)/ Clinical Pharmacy Specialist

## 2019-01-31 DIAGNOSIS — I63412 Cerebral infarction due to embolism of left middle cerebral artery: Secondary | ICD-10-CM

## 2019-01-31 DIAGNOSIS — Z952 Presence of prosthetic heart valve: Secondary | ICD-10-CM | POA: Diagnosis not present

## 2019-01-31 DIAGNOSIS — Z7901 Long term (current) use of anticoagulants: Secondary | ICD-10-CM

## 2019-01-31 LAB — POCT INR: INR: 2.2 (ref 2.0–3.0)

## 2019-02-02 ENCOUNTER — Emergency Department (HOSPITAL_BASED_OUTPATIENT_CLINIC_OR_DEPARTMENT_OTHER)
Admission: EM | Admit: 2019-02-02 | Discharge: 2019-02-02 | Disposition: A | Discharge status: Home / Self Care | Payer: BC Managed Care – PPO | Attending: Emergency Medicine | Admitting: Emergency Medicine

## 2019-02-02 ENCOUNTER — Other Ambulatory Visit: Payer: Self-pay

## 2019-02-02 ENCOUNTER — Encounter (HOSPITAL_BASED_OUTPATIENT_CLINIC_OR_DEPARTMENT_OTHER): Payer: Self-pay | Admitting: Emergency Medicine

## 2019-02-02 DIAGNOSIS — K5900 Constipation, unspecified: Secondary | ICD-10-CM | POA: Diagnosis present

## 2019-02-02 DIAGNOSIS — Z79899 Other long term (current) drug therapy: Secondary | ICD-10-CM | POA: Insufficient documentation

## 2019-02-02 DIAGNOSIS — Z7982 Long term (current) use of aspirin: Secondary | ICD-10-CM | POA: Diagnosis not present

## 2019-02-02 DIAGNOSIS — I5022 Chronic systolic (congestive) heart failure: Secondary | ICD-10-CM | POA: Diagnosis not present

## 2019-02-02 DIAGNOSIS — I130 Hypertensive heart and chronic kidney disease with heart failure and stage 1 through stage 4 chronic kidney disease, or unspecified chronic kidney disease: Secondary | ICD-10-CM | POA: Insufficient documentation

## 2019-02-02 DIAGNOSIS — N183 Chronic kidney disease, stage 3 unspecified: Secondary | ICD-10-CM | POA: Insufficient documentation

## 2019-02-02 DIAGNOSIS — Z8673 Personal history of transient ischemic attack (TIA), and cerebral infarction without residual deficits: Secondary | ICD-10-CM | POA: Diagnosis not present

## 2019-02-02 DIAGNOSIS — Z7901 Long term (current) use of anticoagulants: Secondary | ICD-10-CM | POA: Insufficient documentation

## 2019-02-02 DIAGNOSIS — Z85028 Personal history of other malignant neoplasm of stomach: Secondary | ICD-10-CM | POA: Insufficient documentation

## 2019-02-02 DIAGNOSIS — Z952 Presence of prosthetic heart valve: Secondary | ICD-10-CM | POA: Insufficient documentation

## 2019-02-02 DIAGNOSIS — K5641 Fecal impaction: Secondary | ICD-10-CM | POA: Diagnosis not present

## 2019-02-02 DIAGNOSIS — E1122 Type 2 diabetes mellitus with diabetic chronic kidney disease: Secondary | ICD-10-CM | POA: Diagnosis not present

## 2019-02-02 NOTE — ED Provider Notes (Signed)
[place] EMERGENCY DEPARTMENT Provider Note   CSN: [id number] Arrival date & time: 02/02/19  1012     History   Chief Complaint Chief Complaint  Patient presents with  . Constipation    HPI Robert [name] is a 78 y.o. male.     HPI Patient presents with rectal pain and constipation.  Last bowel movement around 4 days ago.  No abdominal pain.  States he does deal with constipation at times.  No blood in stool.  No nausea or vomiting.  States he has had some enemas at home without relief.  States his abdomen is not more swollen than normal. Past Medical History:  Diagnosis Date  . BPH (benign prostatic hyperplasia)   . Chronic anticoagulation    coumadin for mechanical valves  . CKD (chronic kidney disease), stage III   . Diabetes mellitus type 2, diet-controlled ([place])   . Gastric cancer ([place]) 2005   s/p gastrectomy  . High cholesterol   . HOH (hard of hearing)   . Hypertension   . Rheumatic heart disease   . S/P MVR (mitral valve replacement) 2015   55mm StJude mechanical valve  . Stroke [facility])     Patient Active Problem List   Diagnosis Date Noted  . Essential hypertension 02/06/2017  . Cerebrovascular accident (CVA) due to embolism of left middle cerebral artery ([place]) 12/01/2016  . Acute ischemic stroke ([place]) 12/01/2016  . Acute systolic congestive heart failure, NYHA class 3 ([place]) 04/18/2016  . DOE (dyspnea on exertion) 04/07/2016  . Abnormal EKG 10/18/2015  . AKI (acute kidney injury) ([employer])   . BPH (benign prostatic hyperplasia) 08/29/2015  . Elevated troponin 08/29/2015  . SBO (small bowel obstruction) ([place]) 08/29/2015  . Pancreatitis 08/29/2015  . Protein-calorie malnutrition, severe ([place]) 08/29/2015  . Hyperlipidemia 08/29/2015  . Noncompliance with medications 05/26/2015  . Long term (current) use of anticoagulants 05/18/2015  . Coronary artery disease involving native coronary artery of native heart without angina pectoris   . Chronic  systolic (congestive) heart failure ([place]) 05/06/2015  . Hypertensive urgency 05/06/2015  . CKD (chronic kidney disease) stage 3, GFR 30-59 ml/min 05/06/2015  . Chronic anemia 05/06/2015  . H/O mitral valve replacement with mechanical valve 05/06/2015  . CHF (congestive heart failure) ([place]) 05/06/2015    Past Surgical History:  Procedure Laterality Date  . CARDIAC CATHETERIZATION N/A 05/10/2015   Procedure: Right/Left Heart Cath and Coronary Angiography;  Surgeon: Burnell Blanks, MD;  Location: [place] CV LAB;  Service: Cardiovascular;  Laterality: N/A;  . HERNIA REPAIR    . IR ANGIO INTRA EXTRACRAN SEL COM CAROTID INNOMINATE BILAT MOD SED  12/01/2016  . IR ANGIO VERTEBRAL SEL SUBCLAVIAN INNOMINATE UNI R MOD SED  12/01/2016  . MITRAL VALVE REPLACEMENT  10/24/2013   4mm StJude mechanical valve  . PARTIAL GASTRECTOMY  2005        Home Medications    Prior to Admission medications   Medication Sig Start Date End Date Taking? Authorizing Provider  amoxicillin (AMOXIL) 500 MG tablet Take 4 tablets by mouth 30-60 mins prior to dental procedure 08/23/18   Charlie Pitter, PA-C  aspirin EC 81 MG tablet Take 81 mg by mouth daily.    [provider]  atorvastatin (LIPITOR) 20 MG tablet TAKE 1 TABLET BY MOUTH EVERYDAY AT BEDTIME 12/02/18   Hilty, Nadean Corwin, MD  calcium carbonate (OSCAL) 1500 (600 Ca) MG TABS tablet Take 1,500 mg by mouth daily with breakfast.  [provider]  carvedilol (COREG) 12.5 MG tablet Take 0.5 tablets (6.25 mg total) by mouth 2 (two) times daily with a meal. 10/09/18   Hilty, Nadean Corwin, MD  ferrous sulfate 325 (65 FE) MG tablet Take 1 tablet (325 mg total) by mouth 2 (two) times daily with a meal. 05/13/15   Regalado, Belkys A, MD  furosemide (LASIX) 40 MG tablet Take 0.5 tablets (20 mg total) by mouth daily. 10/09/18   Hilty, Nadean Corwin, MD  hydrALAZINE (APRESOLINE) 50 MG tablet Take 1 tablet (50 mg total) by mouth 2 (two) times daily. 11/29/18    Kroeger, Lorelee Cover., PA-C  isosorbide mononitrate (IMDUR) 30 MG 24 hr tablet Take 1 tablet as needed for BP < 110 10/09/18   Hilty, Nadean Corwin, MD  Multiple Vitamin (MULTIVITAMIN WITH MINERALS) TABS tablet Take 1 tablet by mouth daily.    [provider]  tamsulosin (FLOMAX) 0.4 MG CAPS capsule Take 0.8 mg by mouth daily.    [provider]  Thiamine Mononitrate (VITAMIN B1 PO) Take 1 tablet by mouth daily.     [provider]  vitamin B-12 (CYANOCOBALAMIN) 1000 MCG tablet Take 1 tablet (1,000 mcg total) by mouth daily. 05/13/15   Regalado, Belkys A, MD  warfarin (COUMADIN) 3 MG tablet TAKE 1 TO 1 & 1/2 TABLETS BY MOUTH DAILY AS DIRECTED BY COUMADIN CLINIC 01/06/19   Hilty, Nadean Corwin, MD    Family History Family History  Problem Relation Age of Onset  . Hypertension Mother   . Diabetes Mother   . Cancer Brother     Social History Social History   Tobacco Use  . Smoking status: Never Smoker  . Smokeless tobacco: Never Used  Substance Use Topics  . Alcohol use: No    Alcohol/week: 0.0 standard drinks  . Drug use: No     Allergies   Penicillins and Vancomycin   Review of Systems Review of Systems  Constitutional: Negative for appetite change.  HENT: Negative for congestion.   Respiratory: Negative for shortness of breath.   Cardiovascular: Negative for chest pain.  Gastrointestinal: Positive for constipation and rectal pain.  Genitourinary: Negative for flank pain.  Musculoskeletal: Negative for back pain.  Skin: Negative for rash.  Neurological: Negative for weakness.     Physical Exam Updated Vital Signs BP (!) 200/98 (BP Location: Right Arm)   Pulse 79   Temp (!) 97.5 F (36.4 C) (Oral)   Resp 18   Ht 5\' 6"  (1.676 m)   Wt 59 kg   SpO2 100%   BMI 20.98 kg/m   Physical Exam Vitals signs and nursing note reviewed.  HENT:     Head: Normocephalic.  Cardiovascular:     Rate and Rhythm: Normal rate and regular rhythm.  Pulmonary:      Breath sounds: Normal breath sounds.  Abdominal:     Tenderness: There is no abdominal tenderness.  Skin:    General: Skin is warm.  Neurological:     General: No focal deficit present.     Mental Status: He is alert.   Rectal exam showed large amount of stool in rectum but was overall soft.  Disimpacted/loosened with finger.  Good bowel movement after.   ED Treatments / Results  Labs (all labs ordered are listed, but only abnormal results are displayed) Labs Reviewed - No data to display  EKG None  Radiology No results found.  Procedures Procedures (including critical care time)  Medications Ordered in ED Medications -  No data to display   Initial Impression / Assessment and Plan / ED Course  I have reviewed the triage vital signs and the nursing notes.  Pertinent labs & imaging results that were available during my care of the patient were reviewed by me and considered in my medical decision making (see chart for details).        Patient with constipation and rectal pain.  Has had enemas and MiraLAX at home without relief.  Disimpacted while in ER.  Patient feels much better after it.  Discharge home.  Patient will increase MiraLAX to twice a day over the next couple days.  Return for worsening of symptoms  Final Clinical Impressions(s) / ED Diagnoses   Final diagnoses:  Fecal impaction in rectum [facility])  Constipation, unspecified constipation type    ED Discharge Orders    None       Davonna Belling, MD 02/02/19 1135

## 2019-02-02 NOTE — Discharge Instructions (Signed)
Continue to take the MiraLAX to help.  Return for worsening pain.

## 2019-02-02 NOTE — ED Triage Notes (Signed)
Constipation x 4 days

## 2019-02-21 ENCOUNTER — Ambulatory Visit (INDEPENDENT_AMBULATORY_CARE_PROVIDER_SITE_OTHER): Payer: BC Managed Care – PPO | Admitting: Pharmacist Clinician (PhC)/ Clinical Pharmacy Specialist

## 2019-02-21 ENCOUNTER — Other Ambulatory Visit: Payer: Self-pay

## 2019-02-21 DIAGNOSIS — Z7901 Long term (current) use of anticoagulants: Secondary | ICD-10-CM | POA: Diagnosis not present

## 2019-02-21 DIAGNOSIS — Z952 Presence of prosthetic heart valve: Secondary | ICD-10-CM

## 2019-02-21 LAB — POCT INR: INR: 3.1 — AB (ref 2.0–3.0)

## 2019-03-04 ENCOUNTER — Other Ambulatory Visit: Payer: Self-pay | Admitting: Internal Medicine

## 2019-03-05 ENCOUNTER — Other Ambulatory Visit: Payer: Self-pay | Admitting: Cardiology

## 2019-03-21 ENCOUNTER — Ambulatory Visit (INDEPENDENT_AMBULATORY_CARE_PROVIDER_SITE_OTHER): Payer: BC Managed Care – PPO | Admitting: Pharmacist Clinician (PhC)/ Clinical Pharmacy Specialist

## 2019-03-21 ENCOUNTER — Other Ambulatory Visit: Payer: Self-pay

## 2019-03-21 DIAGNOSIS — I639 Cerebral infarction, unspecified: Secondary | ICD-10-CM

## 2019-03-21 DIAGNOSIS — Z952 Presence of prosthetic heart valve: Secondary | ICD-10-CM

## 2019-03-21 DIAGNOSIS — Z7901 Long term (current) use of anticoagulants: Secondary | ICD-10-CM | POA: Diagnosis not present

## 2019-03-21 LAB — POCT INR: INR: 2.8 (ref 2.0–3.0)

## 2019-04-11 ENCOUNTER — Other Ambulatory Visit: Payer: Self-pay

## 2019-04-11 ENCOUNTER — Encounter: Payer: Self-pay | Admitting: Pharmacist

## 2019-04-11 ENCOUNTER — Ambulatory Visit (INDEPENDENT_AMBULATORY_CARE_PROVIDER_SITE_OTHER): Payer: BC Managed Care – PPO | Admitting: Pharmacist

## 2019-04-11 DIAGNOSIS — Z7901 Long term (current) use of anticoagulants: Secondary | ICD-10-CM

## 2019-04-11 DIAGNOSIS — Z952 Presence of prosthetic heart valve: Secondary | ICD-10-CM | POA: Diagnosis not present

## 2019-04-11 LAB — POCT INR: INR: 3.5 — AB (ref 2.0–3.0)

## 2019-05-04 ENCOUNTER — Ambulatory Visit: Payer: BC Managed Care – PPO | Attending: Internal Medicine

## 2019-05-04 DIAGNOSIS — Z23 Encounter for immunization: Secondary | ICD-10-CM | POA: Insufficient documentation

## 2019-05-04 NOTE — Progress Notes (Signed)
   Covid-19 Vaccination Clinic  Name:  Robert [name]    MRN: [id number] DOB: October 02, 1940  05/04/2019  Robert [name] was observed post Covid-19 immunization for 15 minutes without incidence. He was provided with Vaccine Information Sheet and instruction to access the V-Safe system.   Robert [name] was instructed to call 911 with any severe reactions post vaccine: Marland Kitchen Difficulty breathing  . Swelling of your face and throat  . A fast heartbeat  . A bad rash all over your body  . Dizziness and weakness    Immunizations Administered    Name Date Dose VIS Date Route   Pfizer COVID-19 Vaccine 05/04/2019  2:10 PM 0.3 mL 02/21/2019 Intramuscular   Manufacturer: [employer]   Lot: [id number]   [place]: [id number]

## 2019-05-08 ENCOUNTER — Ambulatory Visit (INDEPENDENT_AMBULATORY_CARE_PROVIDER_SITE_OTHER): Payer: BC Managed Care – PPO | Admitting: Pharmacist

## 2019-05-08 ENCOUNTER — Other Ambulatory Visit: Payer: Self-pay

## 2019-05-08 DIAGNOSIS — Z952 Presence of prosthetic heart valve: Secondary | ICD-10-CM | POA: Diagnosis not present

## 2019-05-08 DIAGNOSIS — Z7901 Long term (current) use of anticoagulants: Secondary | ICD-10-CM

## 2019-05-08 LAB — POCT INR: INR: 3.4 — AB (ref 2.0–3.0)

## 2019-05-13 ENCOUNTER — Other Ambulatory Visit: Payer: Self-pay | Admitting: Internal Medicine

## 2019-05-26 ENCOUNTER — Telehealth: Payer: Self-pay | Admitting: Internal Medicine

## 2019-05-26 NOTE — Telephone Encounter (Signed)
Pt c/o Shortness Of Breath: STAT if SOB developed within the last 24 hours or pt is noticeably SOB on the phone  1. Are you currently SOB (can you hear that pt is SOB on the phone)? no  2. How long have you been experiencing SOB? Over a week, getting worse  3. Are you SOB when sitting or when up moving around? When moving around  4. Are you currently experiencing any other symptoms? No- pt wanted an appt-  I made a Virtual Visit for Wednesday(05-28-19)

## 2019-05-26 NOTE — Telephone Encounter (Signed)
Returned call to patient's wife.She stated husband is sob with exertion.Swelling in lower legs.Stated he saw PCP a couple of weeks ago and he stopped his Lasix due to husband being weak.Stated she gave him Lasix 40 mg yesterday.He used to take 20 mg daily before he stopped.Advised to take 40 mg daily for the next 2 days then return to normal dose 20 mg daily.Advised to keep virtual appointment with Dr.Hilty 3/17 at 9:15 am.

## 2019-05-28 ENCOUNTER — Encounter: Payer: Self-pay | Admitting: Internal Medicine

## 2019-05-28 ENCOUNTER — Telehealth (INDEPENDENT_AMBULATORY_CARE_PROVIDER_SITE_OTHER): Payer: BC Managed Care – PPO | Admitting: Internal Medicine

## 2019-05-28 ENCOUNTER — Telehealth: Payer: Self-pay | Admitting: Internal Medicine

## 2019-05-28 ENCOUNTER — Ambulatory Visit: Payer: BC Managed Care – PPO | Attending: Internal Medicine

## 2019-05-28 VITALS — BP 130/65 | HR 98 | Ht 66.0 in | Wt 128.0 lb

## 2019-05-28 DIAGNOSIS — N183 Chronic kidney disease, stage 3 unspecified: Secondary | ICD-10-CM

## 2019-05-28 DIAGNOSIS — Z7901 Long term (current) use of anticoagulants: Secondary | ICD-10-CM

## 2019-05-28 DIAGNOSIS — I5033 Acute on chronic diastolic (congestive) heart failure: Secondary | ICD-10-CM

## 2019-05-28 DIAGNOSIS — Z23 Encounter for immunization: Secondary | ICD-10-CM

## 2019-05-28 DIAGNOSIS — Z952 Presence of prosthetic heart valve: Secondary | ICD-10-CM

## 2019-05-28 MED ORDER — FUROSEMIDE 40 MG PO TABS: ORAL_TABLET | ORAL | 3 refills | Status: DC

## 2019-05-28 NOTE — Progress Notes (Signed)
Virtual Visit via Video Note   This visit type was conducted due to national recommendations for restrictions regarding the COVID-19 Pandemic (e.g. social distancing) in an effort to limit this patient's exposure and mitigate transmission in our community.  Due to his co-morbid illnesses, this patient is at least at moderate risk for complications without adequate follow up.  This format is felt to be most appropriate for this patient at this time.  All issues noted in this document were discussed and addressed.  A limited physical exam was performed with this format.  Please refer to the patient's chart for his consent to telehealth for [facility].   Evaluation Performed:  Doxy[employer]me Video visit  Date:  05/28/2019   ID:  Robert [name], DOB [birth year], MRN [id number]  Patient Location:  [place] [address]  Provider location:   [address], [place] [address] [place], [place] [address]  PCP:  Robert Nasuti, MD  Cardiologist:  Robert Casino, MD Electrophysiologist:  None   Chief Complaint:  Swelling, orthopnea, DOE  History of Present Illness:    Robert [name] is a 79 y.o. male who presents via audio/video conferencing for a telehealth visit today.  Robert [name] was seen today for video follow-up.  I last saw him via virtual visit he had noted that he had increased swelling and shortness of breath.  Lab work indicated congestive heart failure with an elevated BNP over 4000.  I doubled his Lasix dose to 40 mg twice daily.  Since then he has had about 4 pound weight loss and significant improvement in his edema as well as his shortness of breath.  A repeat echo was performed which I personally reviewed and showed normal systolic function with diastolic dysfunction and now moderate aortic regurgitation as well as moderate to severe pulmonic regurgitation.  His mitral prosthetic valve is functioning normally.  I shared those results with him today and feel that most likely  his decompensated heart failure was related to worsening aortic regurgitation on top of chronic diastolic dysfunction.  05/28/2019  Robert [name] today via video visit.  He reports recent worsening of swelling and dyspnea on exertion.  He tries to walk every day however noted a few days ago he was very short of breath when walking.  He also noted an increase in swelling.  When I last saw him about a year ago he was on Lasix 40 mg twice daily after heart failure exacerbation.  His BNP at the time was 4500.  Subsequently over the summer his PCP decrease the dose of his Lasix and actually stopped it due to subjective feelings of weakness thinking that he might be over diuresed.  Subsequent to that, he started gaining fluid and having dyspnea on exertion and orthopnea.  He reports he could hardly sleep at night the last few evenings.  Telephone call to triage came in on March 15 at which time he was advised to increase his Lasix to 40 mg daily and this appointment was arranged.  The patient does not have symptoms concerning for COVID-19 infection (fever, chills, cough, or new SHORTNESS OF BREATH).    Prior CV studies:   The following studies were reviewed today:  Chart reviewed Lab work  PMHx:  Past Medical History:  Diagnosis Date  . BPH (benign prostatic hyperplasia)   . Chronic anticoagulation    coumadin for mechanical valves  . CKD (chronic kidney disease), stage III   . Diabetes mellitus type 2,  diet-controlled ([place])   . Gastric cancer ([place]) 2005   s/p gastrectomy  . High cholesterol   . HOH (hard of hearing)   . Hypertension   . Rheumatic heart disease   . S/P MVR (mitral valve replacement) 2015   76mm StJude mechanical valve  . Stroke [facility])     Past Surgical History:  Procedure Laterality Date  . CARDIAC CATHETERIZATION N/A 05/10/2015   Procedure: Right/Left Heart Cath and Coronary Angiography;  Surgeon: Burnell Blanks, MD;  Location: [place] CV LAB;  Service:  Cardiovascular;  Laterality: N/A;  . HERNIA REPAIR    . IR ANGIO INTRA EXTRACRAN SEL COM CAROTID INNOMINATE BILAT MOD SED  12/01/2016  . IR ANGIO VERTEBRAL SEL SUBCLAVIAN INNOMINATE UNI R MOD SED  12/01/2016  . MITRAL VALVE REPLACEMENT  10/24/2013   45mm StJude mechanical valve  . PARTIAL GASTRECTOMY  2005    FAMHx:  Family History  Problem Relation Age of Onset  . Hypertension Mother   . Diabetes Mother   . Cancer Brother     SOCHx:   reports that he has never smoked. He has never used smokeless tobacco. He reports that he does not drink alcohol or use drugs.  ALLERGIES:  Allergies  Allergen Reactions  . Penicillins Nausea And Vomiting    08/23/2018 pt has recently tolerated Amoxicillin per pt and wife Has patient had a PCN reaction causing immediate rash, facial/tongue/throat swelling, SOB or lightheadedness with hypotension: No Has patient had a PCN reaction causing severe rash involving mucus membranes or skin necrosis: No Has patient had a PCN reaction that required hospitalization: Unknown Has patient had a PCN reaction occurring within the last 10 years: No If all of the above answers are "NO", then may proceed with Cephalospor  . Vancomycin Other (See Comments)    Kidney problems    MEDS:  Current Meds  Medication Sig  . amoxicillin (AMOXIL) 500 MG tablet Take 4 tablets by mouth 30-60 mins prior to dental procedure  . aspirin EC 81 MG tablet Take 81 mg by mouth daily.  Marland Kitchen atorvastatin (LIPITOR) 20 MG tablet TAKE 1 TABLET BY MOUTH EVERYDAY AT BEDTIME  . calcium carbonate (OSCAL) 1500 (600 Ca) MG TABS tablet Take 1,500 mg by mouth daily with breakfast.  . carvedilol (COREG) 6.25 MG tablet Take 1 tablet (6.25 mg total) by mouth 2 (two) times daily.  . ferrous sulfate 325 (65 FE) MG tablet Take 1 tablet (325 mg total) by mouth 2 (two) times daily with a meal.  . furosemide (LASIX) 40 MG tablet Take 0.5 tablets (20 mg total) by mouth daily.  . hydrALAZINE (APRESOLINE) 50  MG tablet Take 1 tablet (50 mg total) by mouth 2 (two) times daily.  . isosorbide mononitrate (IMDUR) 30 MG 24 hr tablet Take 1 tablet as needed for BP < 110  . Multiple Vitamin (MULTIVITAMIN WITH MINERALS) TABS tablet Take 1 tablet by mouth daily.  . tamsulosin (FLOMAX) 0.4 MG CAPS capsule Take 0.8 mg by mouth daily.  . Thiamine Mononitrate (VITAMIN B1 PO) Take 1 tablet by mouth daily.   . vitamin B-12 (CYANOCOBALAMIN) 1000 MCG tablet Take 1 tablet (1,000 mcg total) by mouth daily.  Marland Kitchen warfarin (COUMADIN) 3 MG tablet TAKE 1 TO 1 & 1/2 TABLETS BY MOUTH DAILY AS DIRECTED BY COUMADIN CLINIC     ROS: Pertinent items noted in HPI and remainder of comprehensive ROS otherwise negative.  Labs/Other Tests and Data Reviewed:    Recent Labs: 07/16/2018: ALT  28; NT-Pro BNP 4,358 11/29/2018: BUN 22; Creatinine, Ser 1.87; Potassium 4.7; Sodium 142   Recent Lipid Panel Lab Results  Component Value Date/Time   CHOL 125 12/02/2016 02:36 AM   TRIG 27 12/02/2016 02:36 AM   HDL 45 12/02/2016 02:36 AM   CHOLHDL 2.8 12/02/2016 02:36 AM   LDLCALC 75 12/02/2016 02:36 AM    Wt Readings from Last 3 Encounters:  05/28/19 128 lb (58.1 kg)  02/02/19 130 lb (59 kg)  11/29/18 125 lb (56.7 kg)     Exam:    Vital Signs:  BP 130/65   Pulse 98   Ht 5\' 6"  (1.676 m)   Wt 128 lb (58.1 kg)   BMI 20.66 kg/m    General appearance: alert and no distress Lungs: no audible wheezing Abdomen: normal weight Extremities: edema 1+ bilateral pitting edema Skin: Skin color, texture, turgor normal. No rashes or lesions Neurologic: Mental status: Alert, oriented, thought content appropriate Psych: Pleasant  ASSESSMENT & PLAN:    1. Acute on chronic diastolic congestive heart failure 2. Status post mechanical mitral valve replacement 3. Moderate aortic regurgitation 4. Moderate to severe pulmonic regurgitation  Mr. Klecha again has acute on chronic diastolic congestive heart failure due to decrease and ultimately  holding of his diuretics.  He has had some ongoing fatigue and reports at least subjective leg weakness.  I cannot evaluate his power via video visit however he does appear to be in decompensated heart failure with lower extremity edema.  I advised him to increase his Lasix to 40 mg twice daily for 3 days to try to get his weight below 125 pounds and that he should continue on the 40 mg Lasix daily.  We will repeat labs including a metabolic profile and BNP in 1 to 2 weeks and plan follow-up with me in the office afterwards so I can better evaluate him.  He does report that increasing his Lasix to 40 mg daily over the past several days has improved his symptoms.  COVID-19 Education: The signs and symptoms of COVID-19 were discussed with the patient and how to seek care for testing (follow up with PCP or arrange E-visit).  The importance of social distancing was discussed today.  Patient Risk:   After full review of this patients clinical status, I feel that they are at least moderate risk at this time.  Time:   Today, I have spent 25 minutes with the patient with telehealth technology discussing diastolic heart failure, echo findings, medication management.     Medication Adjustments/Labs and Tests Ordered: Current medicines are reviewed at length with the patient today.  Concerns regarding medicines are outlined above.   Tests Ordered: No orders of the defined types were placed in this encounter.   Medication Changes: No orders of the defined types were placed in this encounter.   Disposition:  in 1 month(s)  Robert Casino, MD, [facility], [place] Director of the Advanced Lipid Disorders &  Cardiovascular Risk Reduction Clinic Diplomate of the American Board of Clinical Lipidology Attending Cardiologist  Direct Dial: [phone]  Fax: [phone]  Website:  [email or web address]  Robert Casino, MD  05/28/2019 9:07 AM

## 2019-05-28 NOTE — Telephone Encounter (Signed)
Call attempt x2 made to review telehealth visit instructions. Phone rang once with no answer  - increase lasix to 40mg  BID for 3 days (until weight is under 125lbs) then take 40mg  daily - BMET, BNP in 1-2 weeks (OK to do same day as INR check on 3/25) - f/u with MD in 2-3 weeks

## 2019-05-28 NOTE — Patient Instructions (Addendum)
Medication Instructions:  TAKE lasix 40mg  twice daily for 3 days then decrease to 40mg  daily  Goal weight under 125lbs   *If you need a refill on your cardiac medications before your next appointment, please call your pharmacy*   Lab Work: BMET & BNP in 1-2 weeks Lab work can be done at Dr. Lysbeth Penner office or any other [facility]  If you have labs (blood work) drawn today and your tests are completely normal, you will receive your results only by: Marland Kitchen MyChart Message (if you have MyChart) OR . A paper copy in the mail If you have any lab test that is abnormal or we need to change your treatment, we will call you to review the results.   Testing/Procedures: NONE   Follow-Up: At [facility], you and your health needs are our priority.  As part of our continuing mission to provide you with exceptional heart care, we have created designated Provider Care Teams.  These Care Teams include your primary Cardiologist (physician) and Advanced Practice Providers (APPs -  Physician Assistants and Nurse Practitioners) who all work together to provide you with the care you need, when you need it.  We recommend signing up for the patient portal called "MyChart".  Sign up information is provided on this After Visit Summary.  MyChart is used to connect with patients for Virtual Visits (Telemedicine).  Patients are able to view lab/test results, encounter notes, upcoming appointments, etc.  Non-urgent messages can be sent to your provider as well.   To learn more about what you can do with MyChart, go to [email or web address].    Your next appointment:   2-3 week(s)  The format for your next appointment:   In Person  Provider:   You may see Pixie Casino, MD or one of the following Advanced Practice Providers on your designated Care Team:    Almyra Deforest, PA-C  Fabian Sharp, PA-C or   Roby Lofts, [place]    Other Instructions

## 2019-05-28 NOTE — Progress Notes (Signed)
   Covid-19 Vaccination Clinic  Name:  Robert [name]    MRN: [id number] DOB: [birth year]  05/28/2019  Mr. Robert [name] was observed post Covid-19 immunization for 15 minutes without incident. He was provided with Vaccine Information Sheet and instruction to access the V-Safe system.   Mr. Robert [name] was instructed to call 911 with any severe reactions post vaccine: Marland Kitchen Difficulty breathing  . Swelling of face and throat  . A fast heartbeat  . A bad rash all over body  . Dizziness and weakness   Immunizations Administered    Name Date Dose VIS Date Route   Pfizer COVID-19 Vaccine 05/28/2019  3:51 PM 0.3 mL 02/21/2019 Intramuscular   Manufacturer: [place]   Lot: [id number]   [place]: [id number]

## 2019-05-29 NOTE — Telephone Encounter (Signed)
Spoke with patient and reviewed instructions for 3/17 telehealth visit.  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

## 2019-06-05 ENCOUNTER — Ambulatory Visit (INDEPENDENT_AMBULATORY_CARE_PROVIDER_SITE_OTHER): Payer: BC Managed Care – PPO | Admitting: Pharmacist Clinician (PhC)/ Clinical Pharmacy Specialist

## 2019-06-05 ENCOUNTER — Other Ambulatory Visit: Payer: Self-pay

## 2019-06-05 ENCOUNTER — Telehealth: Payer: Self-pay | Admitting: Physician Assistant

## 2019-06-05 DIAGNOSIS — Z7901 Long term (current) use of anticoagulants: Secondary | ICD-10-CM

## 2019-06-05 DIAGNOSIS — Z952 Presence of prosthetic heart valve: Secondary | ICD-10-CM

## 2019-06-05 LAB — PROTIME-INR
INR: 9.6 (ref 0.9–1.2)
Prothrombin Time: >90 s — ABNORMAL HIGH (ref 9.1–12.0)

## 2019-06-05 LAB — POCT INR: INR: 8 — AB (ref 2.0–3.0)

## 2019-06-05 NOTE — Telephone Encounter (Signed)
Paged by [facility] for INR of 9.3. The patient was seen in coumadin clinic today and INR was 8.0. The patient is already instructed to hold warfarin today by Rockne Menghini, RPH-CPP.   Leanor Kail, PA-C

## 2019-06-06 ENCOUNTER — Telehealth: Payer: Self-pay | Admitting: Pharmacist Clinician (PhC)/ Clinical Pharmacy Specialist

## 2019-06-06 DIAGNOSIS — Z7901 Long term (current) use of anticoagulants: Secondary | ICD-10-CM

## 2019-06-06 NOTE — Telephone Encounter (Signed)
Lab order for CBC d/t elevated INR

## 2019-06-09 ENCOUNTER — Ambulatory Visit (INDEPENDENT_AMBULATORY_CARE_PROVIDER_SITE_OTHER): Payer: BC Managed Care – PPO | Admitting: Pharmacist Clinician (PhC)/ Clinical Pharmacy Specialist

## 2019-06-09 ENCOUNTER — Other Ambulatory Visit: Payer: Self-pay

## 2019-06-09 DIAGNOSIS — Z7901 Long term (current) use of anticoagulants: Secondary | ICD-10-CM | POA: Diagnosis not present

## 2019-06-09 DIAGNOSIS — Z952 Presence of prosthetic heart valve: Secondary | ICD-10-CM | POA: Diagnosis not present

## 2019-06-09 LAB — CBC
Hematocrit: 25.6 % — ABNORMAL LOW (ref 37.5–51.0)
Hemoglobin: 8.3 g/dL — ABNORMAL LOW (ref 13.0–17.7)
MCH: 31.1 pg (ref 26.6–33.0)
MCHC: 32.4 g/dL (ref 31.5–35.7)
MCV: 96 fL (ref 79–97)
Platelets: 259 10*3/uL (ref 150–450)
RBC: 2.67 x10E6/uL — CL (ref 4.14–5.80)
RDW: 13.3 % (ref 11.6–15.4)
WBC: 10.4 10*3/uL (ref 3.4–10.8)

## 2019-06-09 LAB — POCT INR: INR: 1.3 — AB (ref 2.0–3.0)

## 2019-06-10 ENCOUNTER — Other Ambulatory Visit: Payer: Self-pay | Admitting: Internal Medicine

## 2019-06-10 ENCOUNTER — Telehealth: Payer: Self-pay | Admitting: Internal Medicine

## 2019-06-10 LAB — BASIC METABOLIC PANEL
BUN/Creatinine Ratio: 22 (ref 10–24)
BUN: 41 mg/dL — ABNORMAL HIGH (ref 8–27)
CO2: 26 mmol/L (ref 20–29)
Calcium: 8.8 mg/dL (ref 8.6–10.2)
Chloride: 104 mmol/L (ref 96–106)
Creatinine, Ser: 1.84 mg/dL — ABNORMAL HIGH (ref 0.76–1.27)
GFR calc Af Amer: 39 mL/min/{1.73_m2} — ABNORMAL LOW (ref >59)
GFR calc non Af Amer: 34 mL/min/{1.73_m2} — ABNORMAL LOW (ref >59)
Glucose: 210 mg/dL — ABNORMAL HIGH (ref 65–99)
Potassium: 5.5 mmol/L — ABNORMAL HIGH (ref 3.5–5.2)
Sodium: 142 mmol/L (ref 134–144)

## 2019-06-10 LAB — BRAIN NATRIURETIC PEPTIDE: BNP: 2831.7 pg/mL — ABNORMAL HIGH (ref 0.0–100.0)

## 2019-06-10 NOTE — Telephone Encounter (Signed)
Called Mr. Laudadio this am - hemoglobin 8.3 - no recent comparisons, last was ~12 in 2018. He denies dark stool or active GIB - advised him to call his PCP today to be evaluated - may be anemic due to CKD3. Also, INR now 1.3 - has mechanical valve. Reached out to our coumadin clinic to advise medication changes. I have scheduled follow-up with him on Monday, 4/5.  Pixie Casino, MD, [facility], [place] Director of the Advanced Lipid Disorders &  Cardiovascular Risk Reduction Clinic Diplomate of the American Board of Clinical Lipidology Attending Cardiologist  Direct Dial: [phone]  Fax: [phone]  Website:  [email or web address]

## 2019-06-10 NOTE — Telephone Encounter (Signed)
After review: INR of 1.3 was after holding warfarin d/t SUPRA-therapeutic INR of 9.6 on 06/05/2019. Instructions were provided to patient during Coumadin Clinic visit.

## 2019-06-16 ENCOUNTER — Ambulatory Visit: Payer: BC Managed Care – PPO | Admitting: Internal Medicine

## 2019-06-16 ENCOUNTER — Ambulatory Visit (INDEPENDENT_AMBULATORY_CARE_PROVIDER_SITE_OTHER): Payer: BC Managed Care – PPO | Admitting: Pharmacist Clinician (PhC)/ Clinical Pharmacy Specialist

## 2019-06-16 ENCOUNTER — Encounter: Payer: Self-pay | Admitting: Internal Medicine

## 2019-06-16 ENCOUNTER — Other Ambulatory Visit: Payer: Self-pay

## 2019-06-16 VITALS — BP 136/60 | HR 65 | Temp 96.7°F | Ht 66.0 in | Wt 123.0 lb

## 2019-06-16 DIAGNOSIS — Z7901 Long term (current) use of anticoagulants: Secondary | ICD-10-CM

## 2019-06-16 DIAGNOSIS — Z952 Presence of prosthetic heart valve: Secondary | ICD-10-CM

## 2019-06-16 DIAGNOSIS — D649 Anemia, unspecified: Secondary | ICD-10-CM

## 2019-06-16 DIAGNOSIS — I5033 Acute on chronic diastolic (congestive) heart failure: Secondary | ICD-10-CM

## 2019-06-16 LAB — POCT INR: INR: 2.4 (ref 2.0–3.0)

## 2019-06-16 NOTE — Progress Notes (Signed)
OFFICE NOTE  Chief Complaint:  Follow-up anemia  Primary Care Physician: Bonnita Nasuti, MD  HPI:  Robert [name] is a 79 y.o. 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 He has been followed by Dr. Geraldo Pitter at [facility] cardiology and was referred to Dr. Evelina Dun at [facility] in 2013 for evaluation of his mitral stenosis. He was followed clinically and underwent mechanical mitral valve replacement in 2015. Since then he's been compliant on warfarin and has had therapeutic to supratherapeutic INRs. LV function was normal prior to surgery however we cannot find echocardiographic results after his surgery. A heart catheterization performed prior to surgery demonstrated a 70% diagonal stenosis and 55% LAD stenosis, but he did not have any bypass surgery. He now presents with one week of progressive shortness of breath, orthopnea and leg edema concerning for heart failure. He seems to be responding to IV Lasix. Chest x-ray shows COPD changes, small bilateral pleural effusions and some basilar atelectasis. Labs indicate markedly elevated BNP of 1406 and troponin is negative 3. There appears to be a mild iron deficiency anemia with H&H of 9 and 29. INR initially was 4.37 that came down to 3.98. He was admitted and treated for heart failure with diuresis. He underwent right and left heart catheterization which demonstrated the following:   Prox RCA lesion, 20% stenosed.  Prox LAD to Mid LAD lesion, 30% stenosed.  Mid LAD lesion, 40% stenosed.  Ost 1st Diag to 1st Diag lesion, 50% stenosed.  Ost 2nd Diag lesion, 30% stenosed.  1. Mild to moderate non-obstructive CAD 2. Normal movement of mechanical mitral valve leaflets.  3. Elevated filling pressures suggesting residual volume overload.   He also had an echocardiogram which showed a newly reduced LVEF of 45-50%. At  discharge he was scheduled to be on Lasix 60 mg daily, and increase from his prior home dose of 40 mg daily. It was also recommended that he discontinue valsartan and spironolactone. After discharge he had routine lab work which demonstrated acute renal failure and creatinine had increased from 1.8-2.9. He was also markedly hyperkalemic at 5.7. This was brought to the attention of the [facility] DOD Dr. Marlou Porch, who recommended hydration and avoiding potassium with a repeat metabolic profile the next day. The repeat metabolic profile showed creatinine had decreased to 2.7 and potassium was 5.3. Since discharge [name] had poor appetite, weight has remained stable and he is felt weak and had poor energy. Much worse than when he was discharged. I spent a good deal of time reviewing the patient's medications with his wife who had a 3 x 5 note card with his previous medications on the front and new medications on the rear. She tells me that he is actually taking all of these medications, therefore he is taking his old dose of Lasix 40 mg in addition to Lasix 60 mg daily as well as spironolactone and valsartan. Certainly explain his acute renal failure as well as hyperkalemia. I reviewed his discharge medication summary which clearly states to discontinue valsartan and Lasix and noted the dose changes of medications. He was seen in his primary care doctor's office after discharge however it appears that these medication changes were not identified. In addition, his INR is markedly elevated today at 6.2. This is up from 3.5 where it was 1 week ago. I suspect again this is related to renal failure, decreased appetite  and nutritional reasons. Medication noncompliance could be playing a role.  [name] returns today for follow-up. Since we sorted out his medications he's done much better. His creatinine has gone back down to baseline of 1.8. He is on iron and B12 and his hemoglobin and hematocrit of come up to 10.7 and  32. He reports marked improvement in his energy. He does say that he has problems with decreased energy and sleep although he feels like he gets a restful night sleep. He denies any snoring or apnea. He wakes up fairly early in the morning and does yoga and often takes a nap in the morning. Unfortunately his INR became subtherapeutic after holding his warfarin for coagulopathy. On the 20th it was 1.4. Today his INR is 1.8.  10/18/2015  [name] returns today for follow-up of 2 recent hospitalizations. He was seen at [facility] and subsequently at [facility] a couple weeks later for small bowel obstruction. He does have a history of gastric cancer and is status post subtotal gastrectomy in 2005. Unfortunately he has not been able to eat and has lost a significant amount of weight. He is currently 119 pounds with a BMI of 19 and does feel somewhat weak. Fortunately, however his LV function has improved back to 55-60% by echo. He was seen in consultation and no further cardiac workup was recommended as he did have some mildly elevated troponins. Systolic to be nonspecific. He's had no problems with his mechanical mitral valve and INR today is finally controlled at 3.5. He tells me he is interested in getting some dental work including having several teeth pulled and having a denture placed at [facility] in the near future.  04/07/2016  [name] presents today with some recent dyspnea on exertion. He says over the past 2 weeks she's become short of breath walking up stairs and now walking on a flat surface. EF had improved recently up to 55-60% by echo, however this was during hospitalization for a small bowel obstruction. At the time he did have elevated troponins but was felt due to a noncardiac etiology. He denies any chest pain however his progressive dyspnea and fatigue with exertion could be due to coronary ischemia. He was noted to have moderate nonobstructive coronary disease by cath more than a  year ago.  04/18/2016  Mr. Rakes was seen back today in follow-up. He reports some continued shortness of breath and now some lower strandy swelling. He underwent a Lexiscan Myoview which showed no ischemia however EF was back to 45%. He has had 7 pound weight gain since I last saw him consistent with acute systolic congestive heart failure. He is only on Lasix 20 mg every other day.  05/15/2016  Mr. Riva returns today for follow-up. Blood pressure is elevated 156/70. He reports that increasing his Lasix has helped with some of his shortness of breath and abdominal swelling. Unfortunately did not get blood work drawn as I had requested. He is due for repeat metabolic profile and BNP. We discussed briefly the addition of Entresto he may be a good candidate for that. I like to evaluate his labs today and make a determination based on his renal function. He also is reported recently a decrease in appetite and early satiety, particularly with breads and other foods that he cannot tolerate. This sounds like more of a GI issue and I've encouraged him to follow-up with his gastroenterologist in [place].  06/06/2016  Mr. Mayeux was seen today in follow-up.  He reports breathing improved significantly as well as the swelling reduced after adding a second dose of diuretics to his regimen. Currently he is on Lasix 40 mg twice a day. Weight is gone down from 131-127 pounds. He has no lower extremity edema. In fact he is close to underweight, more consistent with the fact that his diet is very picky and he continues to have problems with his GI system. His wife says that he eats very scant portions. The pressure is well-controlled today. He has not had repeat lab work which I recommended to reassess his creatinine. In the past his creatinine is been somewhat higher on increased dose of diuretics, but we may need to allow this. I'd also like to see if his chronic kidney disease would support the use of  Entresto.  12/07/2016  Mr. 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 It was thought this is related to a mechanical mitral valve and being subtherapeutic with his INR. He is confused as to recently as soon as 2 weeks prior to the incident his INR was well controlled. He had reported problems with eating and was being treated by gastroenterologist for what sounds like SIBO. It's possible that the combination of his antibiotics including Cipro and Flagyl, particularly the latter, may have affected his INR. His admission INR was 2.31 however subsequent INRs were 1.74 and 1.35. He is now on twice-daily Lovenox bridging and we are working to reestablish a therapeutic INR. Fortunately he seems to have some mild deficits with mild left facial droop and some dysarthria.  03/14/2017  Mr. Crum was seen today in follow-up.  He has had no further stroke or TIA symptoms.  He saw Dr. Erlinda Hong in the fall who felt like he was doing well.  We have managed to keep his INR in the 3-3.5 range.  He has been more compliant with his medication.  He now gets INR checks every 2 weeks.  Recently his blood pressure has been running higher.  His carvedilol was increased from 6.25 twice a day to 12.5 twice a day.  His wife reports no significant change in his blood pressure with this.  At times he gets a little dizzy which may be associated with his high blood pressure.  04/26/2017  Mr. Chalfin was seen today in follow-up.  We have been working to improve his blood pressures.  For some reason they have increased recently.  He has been placed on hydralazine 25 mg 3 times daily in addition to his other medications and pressure today was 124/60.  Interestingly number blood pressures at home have ranged between 885 and 027 systolic.  He does have a relatively new blood  pressure cuff which is only 33 months old.  He is also complaining of shortness of breath today.  He says over the past several weeks he has been more short of breath with exertion, particularly walking up inclines or stairs.  Weight seems to be increased about 10 pounds over the past 2-4 weeks.  He reports some pitting edema of his ankles.  06/07/2017  Mr. Goostree returns for follow-up.  His INR was checked today was slightly elevated at 4.0.  His weight has continued to trend down.  He is not eating as much is he has been recently.  He has been using increased dose Lasix 40 mg daily and reports  improvement in breathing and swelling.  This is at the cost of a slight increase in creatinine which were tolerating.  He is also been anemic, which appears to be related to chronic blood loss in the setting of anticoagulation.  He seen GI and is being seen by hematology within the [facility].  Of note he is bradycardic today with heart rates in the 40s.  This is consistently trended down over the past month and seems to be associated with his weight loss which is down 7 pounds.  11/27/2017  Mr. Perrell is seen today in follow-up.  Overall he seems to be doing well.  He had a repeat INR today which was 2.7.  He recently went on Lovenox for a colonoscopy which was negative.  He does report lower extremity edema.  He says he has some discomfort he notes his legs after standing for several hours.  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  It tends to be worse at the end of the day.  He notes that after elevating his feet at night the symptoms improve.  Weight has actually been fairly stable.  06/16/2019  Mr. Marinos returns today for follow-up.  Actually have seen him numerous times via telemedicine visit however recently he was noted to have a supratherapeutic INR.  This is after an infection for which he was receiving antibiotics and steroids.  His INR was over 8.  Subsequently warfarin was held and then he  became subtherapeutic with INR of 1.3.  His PCP checked at 1.2.  Today it was rechecked at 2.4.  Of note his labs a week ago though showed that he has a significant anemia.  Hemoglobin was 8.3 with hematocrit of 25.6.  This compared to labs we had 2 years ago which showed a hemoglobin of 12.4 and hematocrit 37.5.  I was able to review labs from his PCP in February 2021 which showed a hemoglobin of 10 indicating about a two-point drop.  He denies any melena or any bright red blood per rectum.  He has had no blood in the urine.  He reportedly had had a colonoscopy a couple years ago which did not show any significant disease.  He does have a mitral valve replacement which is mechanical.  His INR targets are between 3 and 3.5 due to his history of stroke as well.  I did as his normal), hemoglobin 8.6, RBC 2.88, hematocrit to 27.7  his PCP to evaluate him regarding this anemia which could be chronic as he has chronic kidney disease, could be related to iron deficiency or other etiologies.  Essentially they ordered lab work but have not heard back from the provider regarding these results.  We were able to obtain that today and I personally reviewed it.  This demonstrated an adequate B12 level over 8786, high folic acid level, PTT of 26, serum ferritin 147, hematocrit 27.7 with a normal MCV.  Iron studies are as follows: Iron saturation 24, serum iron 70, U IBC 227, TIBC 297 (all normal).  Reticulocyte count was 5.3 indicating adequate bone marrow response.  Haptoglobin was low at less than 10.  LDH was elevated 482.  Creatinine had improved somewhat at 1.67 (he had recent increase in his diuretics).  Despite this lab work, he reports continued fatigue and decrease in energy.  He says weakness is present in his legs.  He did not have a stool card because it was not available however they intend to get that  to see if there is any evidence of occult bleeding.  PMHx:  Past Medical History:  Diagnosis Date  . BPH  (benign prostatic hyperplasia)   . Chronic anticoagulation    coumadin for mechanical valves  . CKD (chronic kidney disease), stage III   . Diabetes mellitus type 2, diet-controlled ([place])   . Gastric cancer ([place]) 2005   s/p gastrectomy  . High cholesterol   . HOH (hard of hearing)   . Hypertension   . Rheumatic heart disease   . S/P MVR (mitral valve replacement) 2015   70mm StJude mechanical valve  . Stroke [facility])     Past Surgical History:  Procedure Laterality Date  . CARDIAC CATHETERIZATION N/A 05/10/2015   Procedure: Right/Left Heart Cath and Coronary Angiography;  Surgeon: Burnell Blanks, MD;  Location: [facility];  Service: Cardiovascular;  Laterality: N/A;  . HERNIA REPAIR    . IR ANGIO INTRA EXTRACRAN SEL COM CAROTID INNOMINATE BILAT MOD SED  12/01/2016  . IR ANGIO VERTEBRAL SEL SUBCLAVIAN INNOMINATE UNI R MOD SED  12/01/2016  . MITRAL VALVE REPLACEMENT  10/24/2013   67mm StJude mechanical valve  . PARTIAL GASTRECTOMY  2005    FAMHx:  Family History  Problem Relation Age of Onset  . Hypertension Mother   . Diabetes Mother   . Cancer Brother     SOCHx:   reports that he has never smoked. He has never used smokeless tobacco. He reports that he does not drink alcohol or use drugs.  ALLERGIES:  Allergies  Allergen Reactions  . Penicillins Nausea And Vomiting    08/23/2018 pt has recently tolerated Amoxicillin per pt and wife Has patient had a PCN reaction causing immediate rash, facial/tongue/throat swelling, SOB or lightheadedness with hypotension: No Has patient had a PCN reaction causing severe rash involving mucus membranes or skin necrosis: No Has patient had a PCN reaction that required hospitalization: Unknown Has patient had a PCN reaction occurring within the last 10 years: No If all of the above answers are "NO", then may proceed with Cephalospor  . Vancomycin Other (See Comments)    Kidney problems    ROS: Pertinent items noted in HPI  and remainder of comprehensive ROS otherwise negative.  HOME MEDS: Current Outpatient Medications  Medication Sig Dispense Refill  . amoxicillin (AMOXIL) 500 MG tablet Take 4 tablets by mouth 30-60 mins prior to dental procedure 4 tablet 2  . aspirin EC 81 MG tablet Take 81 mg by mouth daily.    Marland Kitchen atorvastatin (LIPITOR) 20 MG tablet TAKE 1 TABLET BY MOUTH EVERYDAY AT BEDTIME 60 tablet 10  . calcium carbonate (OSCAL) 1500 (600 Ca) MG TABS tablet Take 1,500 mg by mouth daily with breakfast.    . carvedilol (COREG) 6.25 MG tablet Take 1 tablet (6.25 mg total) by mouth 2 (two) times daily. 180 tablet 3  . ferrous sulfate 325 (65 FE) MG tablet Take 1 tablet (325 mg total) by mouth 2 (two) times daily with a meal. 30 tablet 3  . furosemide (LASIX) 40 MG tablet Take 40 mg by mouth 2 (two) times daily.    . hydrALAZINE (APRESOLINE) 50 MG tablet Take 1 tablet (50 mg total) by mouth 2 (two) times daily. 180 tablet 3  . isosorbide mononitrate (IMDUR) 30 MG 24 hr tablet Take 1 tablet as needed for BP < 110 90 tablet 0  . Multiple Vitamin (MULTIVITAMIN WITH MINERALS) TABS tablet Take 1 tablet by mouth daily.    Marland Kitchen  tamsulosin (FLOMAX) 0.4 MG CAPS capsule Take 0.8 mg by mouth daily.    . Thiamine Mononitrate (VITAMIN B1 PO) Take 1 tablet by mouth daily.     . vitamin B-12 (CYANOCOBALAMIN) 1000 MCG tablet Take 1 tablet (1,000 mcg total) by mouth daily. 7 tablet 0  . warfarin (COUMADIN) 3 MG tablet TAKE 1 TO 1 & 1/2 TABLETS BY MOUTH DAILY AS DIRECTED BY COUMADIN CLINIC 120 tablet 0   No current facility-administered medications for this visit.    LABS/IMAGING: No results found for this or any previous visit (from the past 48 hour(s)). No results found.  WEIGHTS: Wt Readings from Last 3 Encounters:  06/16/19 123 lb (55.8 kg)  05/28/19 128 lb (58.1 kg)  02/02/19 130 lb (59 kg)    VITALS: BP 136/60 (BP Location: Left Arm, Patient Position: Sitting, Cuff Size: Normal)   Pulse 65   Temp (!) 96.7 F  (35.9 C)   Ht 5\' 6"  (1.676 m)   Wt 123 lb (55.8 kg)   BMI 19.85 kg/m   EXAM: General appearance: alert, cachectic and no distress Neck: no carotid bruit, no JVD and thyroid not enlarged, symmetric, no tenderness/mass/nodules Lungs: clear to auscultation bilaterally Heart: regular rate and rhythm, S1, S2 normal and no murmur Abdomen: soft, non-tender; bowel sounds normal; no masses,  no organomegaly Extremities: extremities normal, atraumatic, no cyanosis or edema Pulses: 2+ and symmetric Skin: Skin color, texture, turgor normal. No rashes or lesions Neurologic: Grossly normal Psych: Pleasant  EKG: Normal sinus rhythm 65, LVH by voltage-personally reviewed  ASSESSMENT: 1. Symptomatic anemia/weakness 2. Supratherapeutic INR 3. Symptomatic bradycardia 4. Acute systolic congestive heart failure-EF reduced to 45%, up to 55-60% (2017) 5. Recent multi-infarct territory stroke-concerning for cardioembolic phenomenon  6. Mild to moderate nonobstructive coronary artery disease by cath 7. Normally functioning mechanical mitral valve 8. Warfarin coagulopathy 9. Acute on chronic kidney disease stage III 10. Medication noncompliance 11. Hypertension  PLAN: 1.   Mr. Nicklaus recently had a supratherapeutic INR over 8.  This is likely due to antibiotics and steroids.  After adjusting his meds he became low at 1.2 however today his INR was 2.4.  His labs were checked indicating significant drop in hemoglobin to 8.3 which compares to a hemoglobin of 10.1 in February 2021.  He denies any gross blood in the stool or dark stools.  Iron studies and other labs do not suggest an iron deficiency anemia with a normal reticulocyte count and adequate EPO stores.  LDH was elevated however haptoglobin was low.?  Hemolytic anemia.  He does have a mechanical valve.  Schistocytes were not evaluated for.  Additionally, a stool card is pending.  Based on this, if there is evidence of occult blood then I would recommend  referral back to GI for further evaluation.  If this is negative then most likely I would recommend hematology evaluation.  If his hemoglobin drops below 8 then I would recommend a transfusion.  I think his subjective weakness is likely related to that.  We will plan to repeat labs including a CBC with differential and peripheral smear in 1 month.  Follow-up with me at that time.  Pixie Casino, MD, [facility], [place] Director of the [facility]  Cardiovascular [facility] Clinic Attending Cardiologist  Direct Dial: [phone]  Fax: [phone]  Website:  [email or web address] 06/16/2019, 4:29 PM

## 2019-06-16 NOTE — Patient Instructions (Addendum)
Medication Instructions:  NO CHANGES *If you need a refill on your cardiac medications before your next appointment, please call your pharmacy*   Lab Work: CBC with diff in one month  If you have labs (blood work) drawn today and your tests are completely normal, you will receive your results only by: Marland Kitchen MyChart Message (if you have MyChart) OR . A paper copy in the mail If you have any lab test that is abnormal or we need to change your treatment, we will call you to review the results.   Testing/Procedures: NONE   Follow-Up: At [facility], you and your health needs are our priority.  As part of our continuing mission to provide you with exceptional heart care, we have created designated Provider Care Teams.  These Care Teams include your primary Cardiologist (physician) and Advanced Practice Providers (APPs -  Physician Assistants and Nurse Practitioners) who all work together to provide you with the care you need, when you need it.  We recommend signing up for the patient portal called "MyChart".  Sign up information is provided on this After Visit Summary.  MyChart is used to connect with patients for Virtual Visits (Telemedicine).  Patients are able to view lab/test results, encounter notes, upcoming appointments, etc.  Non-urgent messages can be sent to your provider as well.   To learn more about what you can do with MyChart, go to [email or web address].    Your next appointment:   1 month(s)  The format for your next appointment:   In Person  Provider:   You may see Pixie Casino, MD or one of the following Advanced Practice Providers on your designated Care Team:    Almyra Deforest, PA-C  Fabian Sharp, PA-C or   Roby Lofts, [place]    Other Instructions

## 2019-06-18 LAB — HEMOCCULT GUIAC POC 1CARD (OFFICE): Fecal Occult Blood, POC: POSITIVE — AB

## 2019-06-19 ENCOUNTER — Telehealth: Payer: Self-pay | Admitting: Internal Medicine

## 2019-06-19 NOTE — Telephone Encounter (Signed)
Ots wife called to report the pt has been very weak and tired the last few days... she is worried the Lasix has been too much for him. He is on Lasix 40 mg bid... his edema has improved. His BP is still running 136/60....she did not have any other numbers to report. She says he has not been eating and drinking well. She denies any other symptoms such as chest pain, n/v, headache, palpitations. No fever.   She is asking to cut back on the fluid pill to see if he feels better.   Will forward to Dr. Debara Pickett for review. He was seen 06/16/19.

## 2019-06-19 NOTE — Telephone Encounter (Signed)
Ok .. cut back to 40 mg daily and monitor weight and swelling.  Dr Lemmie Evens

## 2019-06-19 NOTE — Telephone Encounter (Signed)
Pts wife advised and will call next week with an update on the pt.

## 2019-06-19 NOTE — Telephone Encounter (Signed)
New Message  Pt c/o medication issue:  1. Name of Medication:  furosemide (LASIX) 40 MG tablet   2. How are you currently taking this medication (dosage and times per day)? As directed  3. Are you having a reaction (difficulty breathing--STAT)? Feeling very tired/weak  4. What is your medication issue? Patient was prescribed the medication due to swelling, the swelling in gone now, and patient is still taking the medication. Patient's wife states that patient is very tired all the time and very weak. Please give patient a call back.

## 2019-06-30 ENCOUNTER — Ambulatory Visit (INDEPENDENT_AMBULATORY_CARE_PROVIDER_SITE_OTHER): Payer: BC Managed Care – PPO | Admitting: Pharmacist

## 2019-06-30 ENCOUNTER — Telehealth: Payer: Self-pay | Admitting: Physician Assistant

## 2019-06-30 ENCOUNTER — Other Ambulatory Visit: Payer: Self-pay

## 2019-06-30 DIAGNOSIS — Z952 Presence of prosthetic heart valve: Secondary | ICD-10-CM | POA: Diagnosis not present

## 2019-06-30 DIAGNOSIS — Z7901 Long term (current) use of anticoagulants: Secondary | ICD-10-CM

## 2019-06-30 LAB — POCT INR: INR: 7.4 — AB (ref 2.0–3.0)

## 2019-06-30 LAB — PROTIME-INR
INR: 5.8 (ref 0.9–1.2)
Prothrombin Time: 57.4 s — ABNORMAL HIGH (ref 9.1–12.0)

## 2019-06-30 NOTE — Patient Instructions (Signed)
*   HOLD warfarin for next 3 days, repeat INR on Thursday. Take Furosemide 40mg  tonight, then continue ONLY once daily*

## 2019-06-30 NOTE — Telephone Encounter (Signed)
Received result from [place] [id number]  Reference number [id number]   PT 57.4   INR 5.8  Patient was instructed during the day to hold coumadin for 3 days, will forward to our clinical pharmacist

## 2019-07-01 NOTE — Telephone Encounter (Signed)
See- anticoagulation note from 06/30/2019

## 2019-07-03 ENCOUNTER — Telehealth: Payer: Self-pay | Admitting: Medical

## 2019-07-03 NOTE — Telephone Encounter (Signed)
   Patient called the after hours line for recommendations on his coumadin. He has been following with the coumadin clinic for elevated INRs. He has been off his coumadin since 4/19 for INR 7.4 in the office, confirmed to be 5.8 on official labs. He was recommended to hold his coumadin for 3 days and follow-up 07/02/19 for repeat INR check. He unfortunately missed this appointment. Difficult to weigh in on recommendations without a repeat INR. Suspect INR has trended down. Given mechanical valve status and no complaints of bleeding, recommended he take 3mg  tonight and follow-up with the coumadin clinic tomorrow. I was able to reschedule him for 07/03/19 at 1:45 pm. Wife asked for an earlier appointment. Expressed the importance of presenting to this appointment. Wife will call the office in the morning to see if an earlier appointment is available. Patient and wife were in agreement with the plan. Will route to the coumadin clinic as FYI.   Abigail Butts, PA-C 07/03/19; 6:23 PM

## 2019-07-04 ENCOUNTER — Other Ambulatory Visit: Payer: Self-pay

## 2019-07-04 ENCOUNTER — Ambulatory Visit (INDEPENDENT_AMBULATORY_CARE_PROVIDER_SITE_OTHER): Payer: BC Managed Care – PPO | Admitting: Pharmacist Clinician (PhC)/ Clinical Pharmacy Specialist

## 2019-07-04 DIAGNOSIS — Z952 Presence of prosthetic heart valve: Secondary | ICD-10-CM

## 2019-07-04 DIAGNOSIS — Z7901 Long term (current) use of anticoagulants: Secondary | ICD-10-CM

## 2019-07-04 DIAGNOSIS — I63412 Cerebral infarction due to embolism of left middle cerebral artery: Secondary | ICD-10-CM | POA: Diagnosis not present

## 2019-07-04 LAB — POCT INR: INR: 1.4 — AB (ref 2.0–3.0)

## 2019-07-10 ENCOUNTER — Encounter: Payer: Self-pay | Admitting: Gastroenterology

## 2019-07-11 ENCOUNTER — Other Ambulatory Visit: Payer: Self-pay

## 2019-07-11 ENCOUNTER — Ambulatory Visit (INDEPENDENT_AMBULATORY_CARE_PROVIDER_SITE_OTHER): Payer: BC Managed Care – PPO | Admitting: Pharmacist

## 2019-07-11 DIAGNOSIS — Z952 Presence of prosthetic heart valve: Secondary | ICD-10-CM

## 2019-07-11 DIAGNOSIS — Z7901 Long term (current) use of anticoagulants: Secondary | ICD-10-CM

## 2019-07-11 LAB — POCT INR: INR: 3.4 — AB (ref 2.0–3.0)

## 2019-07-17 ENCOUNTER — Ambulatory Visit (INDEPENDENT_AMBULATORY_CARE_PROVIDER_SITE_OTHER): Payer: BC Managed Care – PPO | Admitting: Pharmacist Clinician (PhC)/ Clinical Pharmacy Specialist

## 2019-07-17 ENCOUNTER — Other Ambulatory Visit: Payer: Self-pay

## 2019-07-17 DIAGNOSIS — Z7901 Long term (current) use of anticoagulants: Secondary | ICD-10-CM

## 2019-07-17 DIAGNOSIS — Z952 Presence of prosthetic heart valve: Secondary | ICD-10-CM | POA: Diagnosis not present

## 2019-07-17 LAB — POCT INR: INR: 2.7 (ref 2.0–3.0)

## 2019-07-24 LAB — CBC WITH DIFFERENTIAL/PLATELET
Basophils Absolute: 0 10*3/uL (ref 0.0–0.2)
Basos: 1 %
EOS (ABSOLUTE): 0.1 10*3/uL (ref 0.0–0.4)
Eos: 2 %
Hematocrit: 32.7 % — ABNORMAL LOW (ref 37.5–51.0)
Hemoglobin: 10.8 g/dL — ABNORMAL LOW (ref 13.0–17.7)
Immature Grans (Abs): 0 10*3/uL (ref 0.0–0.1)
Immature Granulocytes: 0 %
Lymphocytes Absolute: 0.8 10*3/uL (ref 0.7–3.1)
Lymphs: 11 %
MCH: 31.2 pg (ref 26.6–33.0)
MCHC: 33 g/dL (ref 31.5–35.7)
MCV: 95 fL (ref 79–97)
Monocytes Absolute: 0.6 10*3/uL (ref 0.1–0.9)
Monocytes: 8 %
Neutrophils Absolute: 5.6 10*3/uL (ref 1.4–7.0)
Neutrophils: 78 %
Platelets: 227 10*3/uL (ref 150–450)
RBC: 3.46 x10E6/uL — ABNORMAL LOW (ref 4.14–5.80)
RDW: 13.1 % (ref 11.6–15.4)
WBC: 7.1 10*3/uL (ref 3.4–10.8)

## 2019-07-27 ENCOUNTER — Other Ambulatory Visit: Payer: Self-pay | Admitting: Internal Medicine

## 2019-07-31 ENCOUNTER — Ambulatory Visit (INDEPENDENT_AMBULATORY_CARE_PROVIDER_SITE_OTHER): Payer: BC Managed Care – PPO | Admitting: Pharmacist Clinician (PhC)/ Clinical Pharmacy Specialist

## 2019-07-31 ENCOUNTER — Other Ambulatory Visit: Payer: Self-pay

## 2019-07-31 ENCOUNTER — Encounter: Payer: Self-pay | Admitting: Internal Medicine

## 2019-07-31 ENCOUNTER — Ambulatory Visit (INDEPENDENT_AMBULATORY_CARE_PROVIDER_SITE_OTHER): Payer: BC Managed Care – PPO | Admitting: Internal Medicine

## 2019-07-31 VITALS — BP 140/70 | HR 63 | Temp 97.1°F | Ht 66.0 in | Wt 124.0 lb

## 2019-07-31 DIAGNOSIS — R2681 Unsteadiness on feet: Secondary | ICD-10-CM | POA: Diagnosis not present

## 2019-07-31 DIAGNOSIS — Z7901 Long term (current) use of anticoagulants: Secondary | ICD-10-CM

## 2019-07-31 DIAGNOSIS — R791 Abnormal coagulation profile: Secondary | ICD-10-CM

## 2019-07-31 DIAGNOSIS — Z952 Presence of prosthetic heart valve: Secondary | ICD-10-CM

## 2019-07-31 DIAGNOSIS — R600 Localized edema: Secondary | ICD-10-CM

## 2019-07-31 DIAGNOSIS — I5032 Chronic diastolic (congestive) heart failure: Secondary | ICD-10-CM

## 2019-07-31 DIAGNOSIS — N183 Chronic kidney disease, stage 3 unspecified: Secondary | ICD-10-CM | POA: Diagnosis not present

## 2019-07-31 DIAGNOSIS — I639 Cerebral infarction, unspecified: Secondary | ICD-10-CM

## 2019-07-31 LAB — POCT INR: INR: 4.4 — AB (ref 2.0–3.0)

## 2019-07-31 NOTE — Patient Instructions (Signed)
Medication Instructions:  Your physician recommends that you continue on your current medications as directed. Please refer to the Current Medication list given to you today.  *If you need a refill on your cardiac medications before your next appointment, please call your pharmacy*   Follow-Up: At [facility], you and your health needs are our priority.  As part of our continuing mission to provide you with exceptional heart care, we have created designated Provider Care Teams.  These Care Teams include your primary Cardiologist (physician) and Advanced Practice Providers (APPs -  Physician Assistants and Nurse Practitioners) who all work together to provide you with the care you need, when you need it.  We recommend signing up for the patient portal called "MyChart".  Sign up information is provided on this After Visit Summary.  MyChart is used to connect with patients for Virtual Visits (Telemedicine).  Patients are able to view lab/test results, encounter notes, upcoming appointments, etc.  Non-urgent messages can be sent to your provider as well.   To learn more about what you can do with MyChart, go to [email or web address].    Your next appointment:   3 month(s)  The format for your next appointment:   In Person  Provider:   You may see Kenneth C Hilty, MD or one of the following Advanced Practice Providers on your designated Care Team:    Hao Meng, PA-C  Angela Duke, PA-C or   Krista Kroeger, PA-C    Other Instructions   

## 2019-07-31 NOTE — Progress Notes (Signed)
OFFICE NOTE  Chief Complaint:  Follow-up anemia  Primary Care Physician: Bonnita Nasuti, MD  HPI:  Robert [name] is a 79 y.o. 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 He has been followed by Dr. Geraldo Pitter at [facility] cardiology and was referred to Dr. Evelina Dun at [facility] in 2013 for evaluation of his mitral stenosis. He was followed clinically and underwent mechanical mitral valve replacement in 2015. Since then he's been compliant on warfarin and has had therapeutic to supratherapeutic INRs. LV function was normal prior to surgery however we cannot find echocardiographic results after his surgery. A heart catheterization performed prior to surgery demonstrated a 70% diagonal stenosis and 55% LAD stenosis, but he did not have any bypass surgery. He now presents with one week of progressive shortness of breath, orthopnea and leg edema concerning for heart failure. He seems to be responding to IV Lasix. Chest x-ray shows COPD changes, small bilateral pleural effusions and some basilar atelectasis. Labs indicate markedly elevated BNP of 1406 and troponin is negative 3. There appears to be a mild iron deficiency anemia with H&H of 9 and 29. INR initially was 4.37 that came down to 3.98. He was admitted and treated for heart failure with diuresis. He underwent right and left heart catheterization which demonstrated the following:   Prox RCA lesion, 20% stenosed.  Prox LAD to Mid LAD lesion, 30% stenosed.  Mid LAD lesion, 40% stenosed.  Ost 1st Diag to 1st Diag lesion, 50% stenosed.  Ost 2nd Diag lesion, 30% stenosed.  1. Mild to moderate non-obstructive CAD 2. Normal movement of mechanical mitral valve leaflets.  3. Elevated filling pressures suggesting residual volume overload.   He also had an echocardiogram which showed a newly reduced LVEF of 45-50%. At  discharge he was scheduled to be on Lasix 60 mg daily, and increase from his prior home dose of 40 mg daily. It was also recommended that he discontinue valsartan and spironolactone. After discharge he had routine lab work which demonstrated acute renal failure and creatinine had increased from 1.8-2.9. He was also markedly hyperkalemic at 5.7. This was brought to the attention of the [facility] DOD Dr. Marlou Porch, who recommended hydration and avoiding potassium with a repeat metabolic profile the next day. The repeat metabolic profile showed creatinine had decreased to 2.7 and potassium was 5.3. Since discharge Robert [name] had poor appetite, weight has remained stable and he is felt weak and had poor energy. Much worse than when he was discharged. I spent a good deal of time reviewing the patient's medications with his wife who had a 3 x 5 note card with his previous medications on the front and new medications on the rear. She tells me that he is actually taking all of these medications, therefore he is taking his old dose of Lasix 40 mg in addition to Lasix 60 mg daily as well as spironolactone and valsartan. Certainly explain his acute renal failure as well as hyperkalemia. I reviewed his discharge medication summary which clearly states to discontinue valsartan and Lasix and noted the dose changes of medications. He was seen in his primary care doctor's office after discharge however it appears that these medication changes were not identified. In addition, his INR is markedly elevated today at 6.2. This is up from 3.5 where it was 1 week ago. I suspect again this is related to renal failure, decreased appetite  and nutritional reasons. Medication noncompliance could be playing a role.  Robert [name] returns today for follow-up. Since we sorted out his medications he's done much better. His creatinine has gone back down to baseline of 1.8. He is on iron and B12 and his hemoglobin and hematocrit of come up to 10.7 and  32. He reports marked improvement in his energy. He does say that he has problems with decreased energy and sleep although he feels like he gets a restful night sleep. He denies any snoring or apnea. He wakes up fairly early in the morning and does yoga and often takes a nap in the morning. Unfortunately his INR became subtherapeutic after holding his warfarin for coagulopathy. On the 20th it was 1.4. Today his INR is 1.8.  10/18/2015  Robert [name] returns today for follow-up of 2 recent hospitalizations. He was seen at [facility] and subsequently at [facility] a couple weeks later for small bowel obstruction. He does have a history of gastric cancer and is status post subtotal gastrectomy in 2005. Unfortunately he has not been able to eat and has lost a significant amount of weight. He is currently 119 pounds with a BMI of 19 and does feel somewhat weak. Fortunately, however his LV function has improved back to 55-60% by echo. He was seen in consultation and no further cardiac workup was recommended as he did have some mildly elevated troponins. Systolic to be nonspecific. He's had no problems with his mechanical mitral valve and INR today is finally controlled at 3.5. He tells me he is interested in getting some dental work including having several teeth pulled and having a denture placed at [facility] in the near future.  04/07/2016  Robert [name] presents today with some recent dyspnea on exertion. He says over the past 2 weeks she's become short of breath walking up stairs and now walking on a flat surface. EF had improved recently up to 55-60% by echo, however this was during hospitalization for a small bowel obstruction. At the time he did have elevated troponins but was felt due to a noncardiac etiology. He denies any chest pain however his progressive dyspnea and fatigue with exertion could be due to coronary ischemia. He was noted to have moderate nonobstructive coronary disease by cath more than a  year ago.  04/18/2016  Robert [name] was seen back today in follow-up. He reports some continued shortness of breath and now some lower strandy swelling. He underwent a Lexiscan Myoview which showed no ischemia however EF was back to 45%. He has had 7 pound weight gain since I last saw him consistent with acute systolic congestive heart failure. He is only on Lasix 20 mg every other day.  05/15/2016  Robert [name] returns today for follow-up. Blood pressure is elevated 156/70. He reports that increasing his Lasix has helped with some of his shortness of breath and abdominal swelling. Unfortunately did not get blood work drawn as I had requested. He is due for repeat metabolic profile and BNP. We discussed briefly the addition of Entresto he may be a good candidate for that. I like to evaluate his labs today and make a determination based on his renal function. He also is reported recently a decrease in appetite and early satiety, particularly with breads and other foods that he cannot tolerate. This sounds like more of a GI issue and I've encouraged him to follow-up with his gastroenterologist in [place].  06/06/2016  Robert [name] was seen today in follow-up.  He reports breathing improved significantly as well as the swelling reduced after adding a second dose of diuretics to his regimen. Currently he is on Lasix 40 mg twice a day. Weight is gone down from 131-127 pounds. He has no lower extremity edema. In fact he is close to underweight, more consistent with the fact that his diet is very picky and he continues to have problems with his GI system. His wife says that he eats very scant portions. The pressure is well-controlled today. He has not had repeat lab work which I recommended to reassess his creatinine. In the past his creatinine is been somewhat higher on increased dose of diuretics, but we may need to allow this. I'd also like to see if his chronic kidney disease would support the use of  Entresto.  12/07/2016  Robert [name] was unfortunately recently hospitalized with aphasia and weakness and was found to have multiple vessel infarcts including the left MCA, right MCA/PCA, right MCA/ACA territory concerning for a cardioembolic phenomenon. It was thought this is related to a mechanical mitral valve and being subtherapeutic with his INR. He is confused as to recently as soon as 2 weeks prior to the incident his INR was well controlled. He had reported problems with eating and was being treated by gastroenterologist for what sounds like SIBO. It's possible that the combination of his antibiotics including Cipro and Flagyl, particularly the latter, may have affected his INR. His admission INR was 2.31 however subsequent INRs were 1.74 and 1.35. He is now on twice-daily Lovenox bridging and we are working to reestablish a therapeutic INR. Fortunately he seems to have some mild deficits with mild left facial droop and some dysarthria.  03/14/2017  Robert [name] was seen today in follow-up.  He has had no further stroke or TIA symptoms.  He saw Dr. Erlinda Hong in the fall who felt like he was doing well.  We have managed to keep his INR in the 3-3.5 range.  He has been more compliant with his medication.  He now gets INR checks every 2 weeks.  Recently his blood pressure has been running higher.  His carvedilol was increased from 6.25 twice a day to 12.5 twice a day.  His wife reports no significant change in his blood pressure with this.  At times he gets a little dizzy which may be associated with his high blood pressure.  04/26/2017  Robert [name] was seen today in follow-up.  We have been working to improve his blood pressures.  For some reason they have increased recently.  He has been placed on hydralazine 25 mg 3 times daily in addition to his other medications and pressure today was 124/60.  Interestingly number blood pressures at home have ranged between 885 and 027 systolic.  He does have a relatively new blood  pressure cuff which is only 33 months old.  He is also complaining of shortness of breath today.  He says over the past several weeks he has been more short of breath with exertion, particularly walking up inclines or stairs.  Weight seems to be increased about 10 pounds over the past 2-4 weeks.  He reports some pitting edema of his ankles.  06/07/2017  Robert [name] returns for follow-up.  His INR was checked today was slightly elevated at 4.0.  His weight has continued to trend down.  He is not eating as much is he has been recently.  He has been using increased dose Lasix 40 mg daily and reports  improvement in breathing and swelling.  This is at the cost of a slight increase in creatinine which were tolerating.  He is also been anemic, which appears to be related to chronic blood loss in the setting of anticoagulation.  He seen GI and is being seen by hematology within the [place] [facility].  Of note he is bradycardic today with heart rates in the 40s.  This is consistently trended down over the past month and seems to be associated with his weight loss which is down 7 pounds.  11/27/2017  Robert [name] is seen today in follow-up.  Overall he seems to be doing well.  He had a repeat INR today which was 2.7.  He recently went on Lovenox for a colonoscopy which was negative.  He does report lower extremity edema.  He says he has some discomfort he notes his legs after standing for several hours.  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  It tends to be worse at the end of the day.  He notes that after elevating his feet at night the symptoms improve.  Weight has actually been fairly stable.  06/16/2019  Robert [name] returns today for follow-up.  Actually have seen him numerous times via telemedicine visit however recently he was noted to have a supratherapeutic INR.  This is after an infection for which he was receiving antibiotics and steroids.  His INR was over 8.  Subsequently warfarin was held and then he  became subtherapeutic with INR of 1.3.  His PCP checked at 1.2.  Today it was rechecked at 2.4.  Of note his labs a week ago though showed that he has a significant anemia.  Hemoglobin was 8.3 with hematocrit of 25.6.  This compared to labs we had 2 years ago which showed a hemoglobin of 12.4 and hematocrit 37.5.  I was able to review labs from his PCP in February 2021 which showed a hemoglobin of 10 indicating about a two-point drop.  He denies any melena or any bright red blood per rectum.  He has had no blood in the urine.  He reportedly had had a colonoscopy a couple years ago which did not show any significant disease.  He does have a mitral valve replacement which is mechanical.  His INR targets are between 3 and 3.5 due to his history of stroke as well.  I did as his normal), hemoglobin 8.6, RBC 2.88, hematocrit to 27.7  his PCP to evaluate him regarding this anemia which could be chronic as he has chronic kidney disease, could be related to iron deficiency or other etiologies.  Essentially they ordered lab work but have not heard back from the provider regarding these results.  We were able to obtain that today and I personally reviewed it.  This demonstrated an adequate B12 level over 8786, high folic acid level, PTT of 26, serum ferritin 147, hematocrit 27.7 with a normal MCV.  Iron studies are as follows: Iron saturation 24, serum iron 70, U IBC 227, TIBC 297 (all normal).  Reticulocyte count was 5.3 indicating adequate bone marrow response.  Haptoglobin was low at less than 10.  LDH was elevated 482.  Creatinine had improved somewhat at 1.67 (he had recent increase in his diuretics).  Despite this lab work, he reports continued fatigue and decrease in energy.  He says weakness is present in his legs.  He did not have a stool card because it was not available however they intend to get that  to see if there is any evidence of occult bleeding.  07/31/2019  Robert [name] returns today for follow-up.  He says  he is feeling overall little better.  His hemoglobin has increased from 8-10 without any transfusion.  His INR has been better controlled although was elevated today at 4.4.  This will be further adjusted by her pharmacist.  He does have a follow-up with [facility], Dr. Lyndel Safe in [facility] for further evaluation and what sounds like a capsule endoscopy.  He has had some instability weakness in his legs.  His wife is requesting a prescription for a cane.  I have encouraged some more strengthening and physical activity.  In addition he should wear compression stockings for his persistent ankle/foot edema.  I he appears euvolemic today and weight has been stable.  He is currently taking Lasix 40 mg daily.  PMHx:  Past Medical History:  Diagnosis Date  . BPH (benign prostatic hyperplasia)   . Chronic anticoagulation    coumadin for mechanical valves  . CKD (chronic kidney disease), stage III   . Diabetes mellitus type 2, diet-controlled ([place])   . Gastric cancer ([place]) 2005   s/p gastrectomy  . High cholesterol   . HOH (hard of hearing)   . Hypertension   . Rheumatic heart disease   . S/P MVR (mitral valve replacement) 2015   58mm StJude mechanical valve  . Stroke [facility])     Past Surgical History:  Procedure Laterality Date  . CARDIAC CATHETERIZATION N/A 05/10/2015   Procedure: Right/Left Heart Cath and Coronary Angiography;  Surgeon: Burnell Blanks, MD;  Location: [facility];  Service: Cardiovascular;  Laterality: N/A;  . COLONOSCOPY  11/22/2017   [place]    . IR ANGIO INTRA EXTRACRAN SEL COM CAROTID INNOMINATE BILAT MOD SED  12/01/2016  . IR ANGIO VERTEBRAL SEL SUBCLAVIAN INNOMINATE UNI R MOD SED  12/01/2016  . MITRAL VALVE REPLACEMENT  10/24/2013   46mm StJude mechanical valve  . PARTIAL GASTRECTOMY  2005  . SMALL BOWEL ENTEROSCOPY  11/22/2017   [facility]     FAMHx:  Family History  Problem Relation Age of Onset  . Hypertension Mother   .  Diabetes Mother   . Cancer Brother     SOCHx:   reports that he has never smoked. He has never used smokeless tobacco. He reports that he does not drink alcohol or use drugs.  ALLERGIES:  Allergies  Allergen Reactions  . Penicillins Nausea And Vomiting    08/23/2018 pt has recently tolerated Amoxicillin per pt and wife Has patient had a PCN reaction causing immediate rash, facial/tongue/throat swelling, SOB or lightheadedness with hypotension: No Has patient had a PCN reaction causing severe rash involving mucus membranes or skin necrosis: No Has patient had a PCN reaction that required hospitalization: Unknown Has patient had a PCN reaction occurring within the last 10 years: No If all of the above answers are "NO", then may proceed with Cephalospor  . Vancomycin Other (See Comments)    Kidney problems    ROS: Pertinent items noted in HPI and remainder of comprehensive ROS otherwise negative.  HOME MEDS: Current Outpatient Medications  Medication Sig Dispense Refill  . amoxicillin (AMOXIL) 500 MG tablet Take 4 tablets by mouth 30-60 mins prior to dental procedure 4 tablet 2  . aspirin EC 81 MG tablet Take 81 mg by mouth daily.    Marland Kitchen atorvastatin (LIPITOR) 20 MG tablet TAKE 1 TABLET BY  MOUTH EVERYDAY AT BEDTIME 60 tablet 10  . calcium carbonate (OSCAL) 1500 (600 Ca) MG TABS tablet Take 1,500 mg by mouth daily with breakfast.    . carvedilol (COREG) 6.25 MG tablet Take 1 tablet (6.25 mg total) by mouth 2 (two) times daily. 180 tablet 3  . ferrous sulfate 325 (65 FE) MG tablet Take 1 tablet (325 mg total) by mouth 2 (two) times daily with a meal. 30 tablet 3  . furosemide (LASIX) 40 MG tablet Take 40 mg by mouth daily.    . hydrALAZINE (APRESOLINE) 50 MG tablet Take 1 tablet (50 mg total) by mouth 2 (two) times daily. 180 tablet 3  . isosorbide mononitrate (IMDUR) 30 MG 24 hr tablet Take 1 tablet as needed for BP < 110 90 tablet 0  . Multiple Vitamin (MULTIVITAMIN WITH MINERALS) TABS  tablet Take 1 tablet by mouth daily.    . tamsulosin (FLOMAX) 0.4 MG CAPS capsule Take 0.8 mg by mouth daily.    . Thiamine Mononitrate (VITAMIN B1 PO) Take 1 tablet by mouth daily.     . vitamin B-12 (CYANOCOBALAMIN) 1000 MCG tablet Take 1 tablet (1,000 mcg total) by mouth daily. 7 tablet 0  . warfarin (COUMADIN) 3 MG tablet TAKE 1 TO 1 & 1/2 TABLETS BY MOUTH DAILY AS DIRECTED BY COUMADIN CLINIC 120 tablet 0   No current facility-administered medications for this visit.    LABS/IMAGING: No results found for this or any previous visit (from the past 48 hour(s)). No results found.  WEIGHTS: Wt Readings from Last 3 Encounters:  07/31/19 124 lb (56.2 kg)  06/16/19 123 lb (55.8 kg)  05/28/19 128 lb (58.1 kg)    VITALS: BP 140/70   Pulse 63   Temp (!) 97.1 F (36.2 C)   Ht 5\' 6"  (1.676 m)   Wt 124 lb (56.2 kg)   SpO2 96%   BMI 20.01 kg/m   EXAM: General appearance: alert, cachectic and no distress Neck: no carotid bruit, no JVD and thyroid not enlarged, symmetric, no tenderness/mass/nodules Lungs: clear to auscultation bilaterally Heart: regular rate and rhythm, S1, S2 normal and systolic murmur: early systolic 2/6, crescendo at 2nd right intercostal space Abdomen: soft, non-tender; bowel sounds normal; no masses,  no organomegaly Extremities: edema 1+ edema Pulses: 2+ and symmetric Skin: Skin color, texture, turgor normal. No rashes or lesions Neurologic: Grossly normal Psych: Pleasant  EKG: Deferred  ASSESSMENT: 1. Symptomatic anemia/weakness 2. Supratherapeutic INR 3. Symptomatic bradycardia 4. Acute systolic congestive heart failure-EF reduced to 45%, up to 55-60% (2017) 5. Recent multi-infarct territory stroke-concerning for cardioembolic phenomenon  6. Mild to moderate nonobstructive coronary artery disease by cath 7. Normally functioning mechanical mitral valve 8. Warfarin coagulopathy 9. Acute on chronic kidney disease stage III 10. Medication  noncompliance 11. Hypertension  PLAN: 1.   Mr. Helling has had improvement in his hemoglobin and improvement in his weakness.  His INR remains elevated, today at 4.4.  Further adjustments will be made.  He says his diet is stable.  He is not had any recent antibiotics however this did cause his coagulopathy recently.  He has a patient visit on June 4 with the [facility] in [place].  Hopefully will have some more answers as to why he had acute anemia.  Plan follow-up with me in 3 months or sooner as necessary.  Pixie Casino, MD, [facility], [place] Director of the Advanced Lipid Disorders &  Cardiovascular Risk  Reduction Clinic Attending Cardiologist  Direct Dial: [phone]  Fax: [phone]  Website:  [email or web address] 07/31/2019, 4:29 PM

## 2019-08-15 ENCOUNTER — Encounter: Payer: Self-pay | Admitting: Gastroenterology

## 2019-08-15 ENCOUNTER — Ambulatory Visit: Payer: BC Managed Care – PPO | Admitting: Gastroenterology

## 2019-08-15 VITALS — BP 144/68 | HR 65 | Temp 97.1°F | Ht 66.0 in | Wt 129.5 lb

## 2019-08-15 DIAGNOSIS — R195 Other fecal abnormalities: Secondary | ICD-10-CM

## 2019-08-15 DIAGNOSIS — D509 Iron deficiency anemia, unspecified: Secondary | ICD-10-CM | POA: Diagnosis not present

## 2019-08-15 DIAGNOSIS — I639 Cerebral infarction, unspecified: Secondary | ICD-10-CM | POA: Insufficient documentation

## 2019-08-15 NOTE — Patient Instructions (Addendum)
You have been scheduled for a CT scan of the abdomen and pelvis at [facility] [place] [address] [facility]).   You are scheduled on       at       . You should arrive 15 minutes prior to your appointment time for registration. Please follow the written instructions below on the day of your exam:  WARNING: IF YOU ARE ALLERGIC TO IODINE/X-RAY DYE, PLEASE NOTIFY RADIOLOGY IMMEDIATELY AT [phone]! YOU WILL BE GIVEN A 13 HOUR PREMEDICATION PREP.  1) Do not eat or drink anything after        (4 hours prior to your test) 2) You have been given 2 bottles of oral contrast to drink. The solution may taste better if refrigerated, but do NOT add ice or any other liquid to this solution. Shake well before drinking.    Drink 1 bottle of contrast @         (2 hours prior to your exam)  Drink 1 bottle of contrast @          (1 hour prior to your exam)  You may take any medications as prescribed with a small amount of water, if necessary. If you take any of the following medications: METFORMIN, GLUCOPHAGE, GLUCOVANCE, AVANDAMET, RIOMET, FORTAMET, [place] MET, JANUMET, GLUMETZA or METAGLIP, you MAY be asked to HOLD this medication 48 hours AFTER the exam.  The purpose of you drinking the oral contrast is to aid in the visualization of your intestinal tract. The contrast solution may cause some diarrhea. Depending on your individual set of symptoms, you may also receive an intravenous injection of x-ray contrast/dye. Plan on being at [facility] for 30 minutes or longer, depending on the type of exam you are having performed.  This test typically takes 30-45 minutes to complete.  If you have any questions regarding your exam or if you need to reschedule, you may call the CT department at [phone] between the hours of 8:00 am and 5:00 pm, Monday-Friday.  OFF IRON FOR 5 DAYS PRIOR TO CT   Your provider has requested that you go to the basement level for  lab work at [facility] in [place] [address]. Press "B" on the elevator. The lab is located at the first door on the left as you exit the elevator.  Eat small frequent meals   ________________________________________________________________________

## 2019-08-15 NOTE — Progress Notes (Signed)
Chief Complaint: Anemia, FOBT pos  Referring Provider:  Bonnita Nasuti, MD      ASSESSMENT AND PLAN;   #1.  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   #2. +FOBT, neg enteroscopy and colonoscopy 11/2017 at [facility]  #3.  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  Plan: - NCCT (No IV contrast d/t CKD) then CE (off iron x 5 days) - CBC, CMP and Vit 12 in 3-4 weeks. If Vit B12 continues to be low, consider B12 shots. - Small but frequent meals. - If need be he may benefit from IV iron with or without aransep (for that he may need hematology or renal consultation) - Avoid nonsteroidals except aspirin. - Discussed extensively with the patient, patient's wife and patient's brother (pediatric surgeon).   HPI:    Robert [name] is a 79 y.o. 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  Malignant gastric ulcer s/p B2 gastrectomy 2005, CVA, HTN, HLD with heme positive stools. With decreasing hemoglobin from 12.8 (2018) to 8.3, s/p oral iron supplements to Hb 10.8.  Had undergone extensive GI evaluation at [facility] including  -Neg enteroscopy by Dr. Aviva Signs 11/22/2017 to mid jejunum. -Neg screening colonoscopy 11/2017 except for mild right colonic diverticulosis.  No nausea, vomiting, heartburn, regurgitation, odynophagia or dysphagia.  No significant diarrhea or constipation (better with high fiber diet).  No melena or hematochezia. No unintentional weight loss (has gained wt). No abdominal pain.  He also has longstanding history of vitamin B12 deficiency and  currently takes oral B12 supplements.  He has not noticed any blood in his stool.  His brother is a Energy manager  Wt Readings from Last 3 Encounters:  08/15/19 129 lb 8 oz (58.7 kg)  07/31/19 124 lb (56.2 kg)  06/16/19 123 lb (55.8 kg)    Past Medical History:  Diagnosis Date  . BPH (benign prostatic hyperplasia)   . Chronic anticoagulation    coumadin for mechanical valves  . CKD (chronic kidney disease), stage III   . Diabetes mellitus type 2, diet-controlled ([place])   . 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 High cholesterol   . HOH (hard of hearing)   . Hypertension   . Rheumatic heart disease   . S/P MVR (mitral valve replacement) 2015   64mm StJude mechanical valve  . Stroke [facility])     Past Surgical History:  Procedure Laterality Date  . CARDIAC CATHETERIZATION N/A 05/10/2015   Procedure: Right/Left Heart Cath and Coronary Angiography;  Surgeon: Burnell Blanks, MD;  Location: [facility];  Service: Cardiovascular;  Laterality: N/A;  . COLONOSCOPY  11/22/2017   [place]    . IR ANGIO INTRA EXTRACRAN SEL COM CAROTID INNOMINATE BILAT MOD SED  12/01/2016  . IR ANGIO VERTEBRAL SEL SUBCLAVIAN INNOMINATE UNI R MOD SED  12/01/2016  . MITRAL VALVE REPLACEMENT  10/24/2013   96mm StJude mechanical valve  . PARTIAL GASTRECTOMY  2005  . SMALL BOWEL ENTEROSCOPY  11/22/2017   [facility]     Family History  Problem Relation Age of Onset  . Hypertension  Mother   . Diabetes Mother   . Cancer Brother   . Colon cancer Neg Hx   . Esophageal cancer Neg Hx     Social History   Tobacco Use  . Smoking status: Never Smoker  . Smokeless tobacco: Never Used  Substance Use Topics  . Alcohol use: No    Alcohol/week: 0.0 standard drinks  . Drug use: No    Current Outpatient Medications  Medication Sig Dispense Refill  . aspirin EC 81 MG tablet Take 81 mg by mouth daily.    Marland Kitchen atorvastatin (LIPITOR) 20 MG tablet TAKE 1 TABLET BY  MOUTH EVERYDAY AT BEDTIME 60 tablet 10  . calcium carbonate (OSCAL) 1500 (600 Ca) MG TABS tablet Take 1,500 mg by mouth daily with breakfast.    . carvedilol (COREG) 6.25 MG tablet Take 1 tablet (6.25 mg total) by mouth 2 (two) times daily. 180 tablet 3  . ferrous sulfate 325 (65 FE) MG tablet Take 1 tablet (325 mg total) by mouth 2 (two) times daily with a meal. 30 tablet 3  . furosemide (LASIX) 40 MG tablet Take 40 mg by mouth daily.    . hydrALAZINE (APRESOLINE) 50 MG tablet Take 1 tablet (50 mg total) by mouth 2 (two) times daily. 180 tablet 3  . isosorbide mononitrate (IMDUR) 30 MG 24 hr tablet Take 1 tablet as needed for BP < 110 90 tablet 0  . Multiple Vitamin (MULTIVITAMIN WITH MINERALS) TABS tablet Take 1 tablet by mouth daily.    . tamsulosin (FLOMAX) 0.4 MG CAPS capsule Take 0.8 mg by mouth daily.    . Thiamine Mononitrate (VITAMIN B1 PO) Take 1 tablet by mouth daily.     . vitamin B-12 (CYANOCOBALAMIN) 1000 MCG tablet Take 1 tablet (1,000 mcg total) by mouth daily. 7 tablet 0  . warfarin (COUMADIN) 3 MG tablet TAKE 1 TO 1 & 1/2 TABLETS BY MOUTH DAILY AS DIRECTED BY COUMADIN CLINIC 120 tablet 0  . amoxicillin (AMOXIL) 500 MG tablet Take 4 tablets by mouth 30-60 mins prior to dental procedure (Patient not taking: Reported on 08/15/2019) 4 tablet 2   No current facility-administered medications for this visit.    Allergies  Allergen Reactions  . Penicillins Nausea And Vomiting    08/23/2018 pt has recently tolerated Amoxicillin per pt and wife Has patient had a PCN reaction causing immediate rash, facial/tongue/throat swelling, SOB or lightheadedness with hypotension: No Has patient had a PCN reaction causing severe rash involving mucus membranes or skin necrosis: No Has patient had a PCN reaction that required hospitalization: Unknown Has patient had a PCN reaction occurring within the last 10 years: No If all of the above answers are "NO", then may proceed with Cephalospor  .  Vancomycin Other (See Comments)    Kidney problems    Review of Systems:  Constitutional: Denies fever, chills, diaphoresis, appetite change and fatigue.  HEENT: Denies photophobia, eye pain, redness, hearing loss, ear pain, congestion, sore throat, rhinorrhea, sneezing, mouth sores, neck pain, neck stiffness and tinnitus.   Respiratory: Denies SOB, DOE, cough, chest tightness,  and wheezing.   Cardiovascular: Denies chest pain, palpitations and leg swelling.  Genitourinary: Denies dysuria, urgency, frequency, hematuria, flank pain and difficulty urinating.  Musculoskeletal: Denies myalgias, back pain, joint swelling, arthralgias and gait problem.  Skin: No rash.  Neurological: Denies dizziness, seizures, syncope, weakness, light-headedness, numbness and headaches.  Hematological: Denies adenopathy. Easy bruising, personal or family bleeding history  Psychiatric/Behavioral: No anxiety or depression  Physical Exam:    Temp (!) 97.1 F (36.2 C)   Ht 5\' 6"  (1.676 m)   Wt 129 lb 8 oz (58.7 kg)   BMI 20.90 kg/m  Wt Readings from Last 3 Encounters:  08/15/19 129 lb 8 oz (58.7 kg)  07/31/19 124 lb (56.2 kg)  06/16/19 123 lb (55.8 kg)   Constitutional:  Well-developed, in no acute distress. Psychiatric: Normal mood and affect. Behavior is normal. HEENT: Pupils normal.  Conjunctivae are normal. No scleral icterus. Neck supple.  Cardiovascular: Normal rate, regular rhythm. No edema Pulmonary/chest: Effort normal and breath sounds normal. No wheezing, rales or rhonchi. Abdominal: Soft, nondistended. Nontender. Bowel sounds active throughout. There are no masses palpable. No hepatomegaly. Rectal:  defered Neurological: Alert and oriented to person place and time. Skin: Skin is warm and dry. No rashes noted.  Data Reviewed: I have personally reviewed following labs and imaging studies  CBC: CBC Latest Ref Rng & Units 07/24/2019 06/09/2019 12/05/2016  WBC 3.4 - 10.8 x10E3/uL 7.1 10.4  8.7  Hemoglobin 13.0 - 17.7 g/dL 10.8(L) 8.3(L) 12.4(L)  Hematocrit 37.5 - 51.0 % 32.7(L) 25.6(L) 37.5(L)  Platelets 150 - 450 x10E3/uL 227 259 163    CMP: CMP Latest Ref Rng & Units 06/09/2019 11/29/2018 07/16/2018  Glucose 65 - 99 mg/dL 210(H) 119(H) 103(H)  BUN 8 - 27 mg/dL 41(H) 22 17  Creatinine 0.76 - 1.27 mg/dL 1.84(H) 1.87(H) 1.68(H)  Sodium 134 - 144 mmol/L 142 142 140  Potassium 3.5 - 5.2 mmol/L 5.5(H) 4.7 5.2  Chloride 96 - 106 mmol/L 104 105 105  CO2 20 - 29 mmol/L 26 26 23   Calcium 8.6 - 10.2 mg/dL 8.8 8.8 8.4(L)  Total Protein 6.0 - 8.5 g/dL - - 6.8  Total Bilirubin 0.0 - 1.2 mg/dL - - 0.6  Alkaline Phos 39 - 117 IU/L - - 77  AST 0 - 40 IU/L - - 29  ALT 0 - 44 IU/L - - 28     Carmell Austria, MD 08/15/2019, 3:44 PM  Cc: Bonnita Nasuti, MD

## 2019-08-18 ENCOUNTER — Other Ambulatory Visit: Payer: Self-pay

## 2019-08-18 ENCOUNTER — Ambulatory Visit (INDEPENDENT_AMBULATORY_CARE_PROVIDER_SITE_OTHER): Payer: BC Managed Care – PPO | Admitting: Pharmacist Clinician (PhC)/ Clinical Pharmacy Specialist

## 2019-08-18 DIAGNOSIS — I63412 Cerebral infarction due to embolism of left middle cerebral artery: Secondary | ICD-10-CM | POA: Diagnosis not present

## 2019-08-18 DIAGNOSIS — Z952 Presence of prosthetic heart valve: Secondary | ICD-10-CM

## 2019-08-18 DIAGNOSIS — Z7901 Long term (current) use of anticoagulants: Secondary | ICD-10-CM

## 2019-08-18 LAB — POCT INR: INR: 4.3 — AB (ref 2.0–3.0)

## 2019-08-20 ENCOUNTER — Other Ambulatory Visit: Payer: Self-pay | Admitting: Specialist

## 2019-08-20 DIAGNOSIS — D509 Iron deficiency anemia, unspecified: Secondary | ICD-10-CM

## 2019-08-20 DIAGNOSIS — K921 Melena: Secondary | ICD-10-CM

## 2019-08-25 ENCOUNTER — Other Ambulatory Visit (HOSPITAL_BASED_OUTPATIENT_CLINIC_OR_DEPARTMENT_OTHER): Payer: Self-pay | Admitting: Gastroenterology

## 2019-08-25 ENCOUNTER — Telehealth: Payer: Self-pay | Admitting: Gastroenterology

## 2019-08-25 DIAGNOSIS — K921 Melena: Secondary | ICD-10-CM

## 2019-08-25 DIAGNOSIS — D509 Iron deficiency anemia, unspecified: Secondary | ICD-10-CM

## 2019-08-25 NOTE — Telephone Encounter (Signed)
Pt's wife inquired about instructions for CT.

## 2019-08-25 NOTE — Telephone Encounter (Signed)
Returned patients call regarding CT instructions.

## 2019-08-26 ENCOUNTER — Other Ambulatory Visit: Payer: Self-pay

## 2019-08-26 ENCOUNTER — Ambulatory Visit (HOSPITAL_BASED_OUTPATIENT_CLINIC_OR_DEPARTMENT_OTHER)
Admission: RE | Admit: 2019-08-26 | Discharge: 2019-08-26 | Disposition: A | Discharge status: Home / Self Care | Payer: BC Managed Care – PPO | Source: Ambulatory Visit | Attending: Gastroenterology | Admitting: Gastroenterology

## 2019-08-26 DIAGNOSIS — K921 Melena: Secondary | ICD-10-CM | POA: Diagnosis present

## 2019-08-26 DIAGNOSIS — D509 Iron deficiency anemia, unspecified: Secondary | ICD-10-CM

## 2019-09-01 ENCOUNTER — Telehealth: Payer: Self-pay | Admitting: Gastroenterology

## 2019-09-01 ENCOUNTER — Ambulatory Visit (INDEPENDENT_AMBULATORY_CARE_PROVIDER_SITE_OTHER): Payer: BC Managed Care – PPO | Admitting: Pharmacist Clinician (PhC)/ Clinical Pharmacy Specialist

## 2019-09-01 ENCOUNTER — Other Ambulatory Visit: Payer: Self-pay

## 2019-09-01 DIAGNOSIS — Z952 Presence of prosthetic heart valve: Secondary | ICD-10-CM | POA: Diagnosis not present

## 2019-09-01 DIAGNOSIS — Z7901 Long term (current) use of anticoagulants: Secondary | ICD-10-CM | POA: Diagnosis not present

## 2019-09-01 LAB — POCT INR: INR: 3 (ref 2.0–3.0)

## 2019-09-01 NOTE — Patient Instructions (Signed)
Hold warfarin Tuesday June 22 and Wednesday June 23 (for potential increase in INR due to endoscopy prep). Then resume 1 tablet daily except 1.5 tablets each Friday.  Repeat INR in 2 weeks

## 2019-09-02 ENCOUNTER — Telehealth: Payer: Self-pay | Admitting: Gastroenterology

## 2019-09-02 NOTE — Telephone Encounter (Signed)
Returned patients call.  Left a message on voicemail to return my call regarding neg Ct scan. Urology referral and need for endo capsule.

## 2019-09-02 NOTE — Telephone Encounter (Signed)
Pts wife called with several questions regarding VCE risks and benefits with particular concern regarding the risks of surgery if the capsule were to become stuck, especially given Coumadin use and comorbid conditions. I reviewed Dr. Steve Rattler recent office note and recent CT AP report. The patient was on the phone with someone else at the time I called so I was not able to discuss with him directly.  I advised the patient and his wife to further discuss VCE with Dr. Lyndel Safe before proceeding. She indicated VCE was scheduled for tomorrow so that will be canceled and can be rescheduled. Briefly discussed SBFT and CT enterography.

## 2019-09-03 ENCOUNTER — Encounter: Payer: BC Managed Care – PPO | Admitting: Gastroenterology

## 2019-09-03 NOTE — Telephone Encounter (Signed)
Have discussed in detail with the patient's wife -They wanted to hold off on capsule endoscopy. -Recheck CBC, CMP, B12 in mid July.  If hemoglobin is coming up well, then will hold off on any further work-up. -Please mail her the copy of CT report. -She would like to discuss with her brother-in-law  (who is a Energy manager) before getting urology consultation. -They would also talk to him regarding renal consultation -Patient and patient's wife should stay in touch with [place] in case of any problems.   RG

## 2019-09-08 NOTE — Telephone Encounter (Signed)
I spoke with patients wife this morning regarding have labs drawn mid July at [address] [place], [place].  If hemoglobin is coming up well, will hold off on any further work up. Ms Bayron verbalized understanding. Copy of CT mailed to patient.

## 2019-09-17 ENCOUNTER — Other Ambulatory Visit: Payer: Self-pay

## 2019-09-17 ENCOUNTER — Ambulatory Visit (INDEPENDENT_AMBULATORY_CARE_PROVIDER_SITE_OTHER): Payer: BC Managed Care – PPO

## 2019-09-17 DIAGNOSIS — Z952 Presence of prosthetic heart valve: Secondary | ICD-10-CM | POA: Diagnosis not present

## 2019-09-17 DIAGNOSIS — Z7901 Long term (current) use of anticoagulants: Secondary | ICD-10-CM

## 2019-09-17 LAB — POCT INR: INR: 2.1 (ref 2.0–3.0)

## 2019-09-17 NOTE — Patient Instructions (Signed)
Take 1.5 tablets today and tomorrow and then resume 1 tablet daily except 1.5 tablets each Friday.  Repeat INR in 2 weeks

## 2019-09-26 ENCOUNTER — Telehealth: Payer: Self-pay | Admitting: Internal Medicine

## 2019-09-26 NOTE — Telephone Encounter (Signed)
Pt c/o Shortness Of Breath: STAT if SOB developed within the last 24 hours or pt is noticeably SOB on the phone  1. Are you currently SOB (can you hear that pt is SOB on the phone)? No  2. How long have you been experiencing SOB? About 1 week, per patient's wife  3. Are you SOB when sitting or when up moving around? Patient's wife states patient has been short of breath every morning for the past week (around 3 AM) while laying down.  4. Are you currently experiencing any other symptoms? No

## 2019-09-26 NOTE — Telephone Encounter (Signed)
Returned call to patient's wife.She stated for the past 2 weeks husband has been waking up around 3:00 am sob.No swelling.Weight stable.Stated he seems to be only be sob when he lies down.Appointment scheduled with Jory Sims DNP 09/30/19 at 3:15 pm.

## 2019-09-29 NOTE — Progress Notes (Signed)
Cardiology Office Note   Date:  09/30/2019   ID:  Robert [name], DOB 06/22/1940, MRN [id number]  PCP:  Robert Nasuti, MD  Cardiologist: Robert [name] No chief complaint on file.    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 He will need SBE prophylaxis. Other history includes hypertension, hyperlipidemia, history of  but has a history to include anemia and chronic lower extremity edema.   He has a history of chronic systolic heart failure with most recent EF at 45%. He has a history of mild to him moderate nonobstructive coronary artery disease by cath. He also has a history of acute on chronic kidney disease stage III.  Last seen by Robert [name] on 07/31/2019. At that visit Robert [name] noted that he had an improvement in his hemoglobin, which was improving his overall energy. His INR continued to remain elevated at 4.4. He was to follow-up with [name] in [facility] for further management as he did have a history.  On 09/26/2019 the patient's wife called stating that he had been waking up for the last 2 weeks short of breath. His weight has been stable, there was no edema. She states that he only becomes short of breath when he lies down. Therefore appointment was made with me today to assess him further.  He apparently had been given instructions to increase his furosemide to 40 mg in the a.m. and 20 mg in the p.m. for 3 days and he did feel better and was able to breathe.  However when returning to 20 mg twice daily his symptoms returned.  He states when he is walking around he feels fine, when he is sitting he feels fine.  But when he lies down in his bed he has orthopnea and PND.  He usually awakens around 2  AM with trouble breathing and has to sit up or eventually just get up and go on with his day.  He often becomes very tired during the day as he has not slept well.   In speaking with his wife she does cook with salt but he  does not add salt after the cooking once they begin to eat it.  He denies racing heart rate or palpitations, chest pain, dizziness, or significant fatigue.  He does get sleepy during the day.  Past Medical History:  Diagnosis Date   BPH (benign prostatic hyperplasia)    Chronic anticoagulation    coumadin for mechanical valves   CKD (chronic kidney disease), stage III    Diabetes mellitus type 2, diet-controlled ([place])    Gastric cancer ([employer]) 2005   s/p gastrectomy   High cholesterol    HOH (hard of hearing)    Hypertension    Rheumatic heart disease    S/P MVR (mitral valve replacement) 2015   37mm StJude mechanical valve   Stroke [facility])     Past Surgical History:  Procedure Laterality Date   CARDIAC CATHETERIZATION N/A 05/10/2015   Procedure: Right/Left Heart Cath and Coronary Angiography;  Surgeon: Burnell Blanks, MD;  Location: [place] [facility];  Service: Cardiovascular;  Laterality: N/A;   COLONOSCOPY  11/22/2017   [facility]   HERNIA REPAIR  IR ANGIO INTRA EXTRACRAN SEL COM CAROTID INNOMINATE BILAT MOD SED  12/01/2016   IR ANGIO VERTEBRAL SEL SUBCLAVIAN INNOMINATE UNI R MOD SED  12/01/2016   MITRAL VALVE REPLACEMENT  10/24/2013   54mm StJude mechanical valve   PARTIAL GASTRECTOMY  2005   SMALL BOWEL ENTEROSCOPY  11/22/2017   [facility]      Current Outpatient Medications  Medication Sig Dispense Refill   amoxicillin (AMOXIL) 500 MG tablet Take 4 tablets by mouth 30-60 mins prior to dental procedure 4 tablet 2   aspirin EC 81 MG tablet Take 81 mg by mouth daily.     atorvastatin (LIPITOR) 20 MG tablet TAKE 1 TABLET BY MOUTH EVERYDAY AT BEDTIME 60 tablet 10   calcium carbonate (OSCAL) 1500 (600 Ca) MG TABS tablet  Take 1,500 mg by mouth daily with breakfast.     carvedilol (COREG) 6.25 MG tablet Take 1 tablet (6.25 mg total) by mouth 2 (two) times daily. 180 tablet 3   ferrous sulfate 325 (65 FE) MG tablet Take 1 tablet (325 mg total) by mouth 2 (two) times daily with a meal. 30 tablet 3   furosemide (LASIX) 20 MG tablet Take 40 mg(2 tablets) in the morning and 20 mg(1 tablet) in the evening 90 tablet 6   hydrALAZINE (APRESOLINE) 50 MG tablet Take 1 tablet (50 mg total) by mouth 2 (two) times daily. (Patient taking differently: Take 25 mg by mouth 2 (two) times daily. ) 180 tablet 3   isosorbide mononitrate (IMDUR) 30 MG 24 hr tablet Take 1 tablet as needed for BP < 110 90 tablet 0   Multiple Vitamin (MULTIVITAMIN WITH MINERALS) TABS tablet Take 1 tablet by mouth daily.     tamsulosin (FLOMAX) 0.4 MG CAPS capsule Take 0.8 mg by mouth daily.     Thiamine Mononitrate (VITAMIN B1 PO) Take 1 tablet by mouth daily.      vitamin B-12 (CYANOCOBALAMIN) 1000 MCG tablet Take 1 tablet (1,000 mcg total) by mouth daily. 7 tablet 0   warfarin (COUMADIN) 3 MG tablet TAKE 1 TO 1 & 1/2 TABLETS BY MOUTH DAILY AS DIRECTED BY COUMADIN CLINIC 120 tablet 0   No current facility-administered medications for this visit.    Allergies:   Penicillins and Vancomycin    Social History:  The patient  reports that he has never smoked. He has never used smokeless tobacco. He reports that he does not drink alcohol and does not use drugs.   Family History:  The patient's family history includes Cancer in his brother; Diabetes in his mother; Hypertension in his mother.    ROS: All other systems are reviewed and negative. Unless otherwise mentioned in H&P    PHYSICAL EXAM: VS:  BP 130/70    Pulse 98    Temp 97.7 F (36.5 C)    Ht 5\' 6"  (1.676 m)    Wt 132 lb 3.2 oz (60 kg)    SpO2 96%    BMI 21.34 kg/m  , BMI Body mass index is 21.34 kg/m. GEN: Well nourished, well developed, in no acute distress HEENT: normal Neck: no  JVD, carotid bruits, or masses Cardiac: IRRR; systolic  murmurs, rubs, or gallops, 2+ pretibial edema  Respiratory:  Clear to auscultation bilaterally, normal work of breathing GI: soft, nontender, nondistended, + BS MS: no deformity or atrophy Skin: warm and dry, no rash Neuro:  Strength and sensation are intact Psych: euthymic mood, full affect   EKG: Atrial fibrillation heart rate of 98 bpm  with nonspecific ST-T wave abnormalities noted.  Personally reviewed.  Compared to EKG on 07/01/2019 he is in atrial fibrillation.  Recent Labs: 06/09/2019: BNP 2,831.7; BUN 41; Creatinine, Ser 1.84; Potassium 5.5; Sodium 142 07/24/2019: Hemoglobin 10.8; Platelets 227    Lipid Panel    Component Value Date/Time   CHOL 125 12/02/2016 0236   TRIG 27 12/02/2016 0236   HDL 45 12/02/2016 0236   CHOLHDL 2.8 12/02/2016 0236   VLDL 5 12/02/2016 0236   LDLCALC 75 12/02/2016 0236      Wt Readings from Last 3 Encounters:  09/30/19 132 lb 3.2 oz (60 kg)  08/15/19 129 lb 8 oz (58.7 kg)  07/31/19 124 lb (56.2 kg)      Other studies Reviewed: Echocardiogram 08-18-18  1. The left ventricle has low normal systolic function, with an ejection  fraction of 50-55%. The cavity size was normal. There is mildly increased  left ventricular wall thickness. Left ventricular diastolic Doppler  parameters are consistent with  impaired relaxation. No evidence of left ventricular regional wall motion  abnormalities.  2. The right ventricle has normal systolic function. The cavity was  moderately enlarged. There is no increase in right ventricular wall  thickness.  3. Left atrial size was mildly dilated.  4. A 25 mm St. Jude mechanical valve is present in the mitral position.  There is no Echo evidence of is functioning normally of the mitral  prosthesis. Normal mitral valve prosthesis.  5. Mildly thickened tricuspid valve leaflets.  6. Tricuspid valve regurgitation is moderate.  7. Mild thickening of  the aortic valve. Mild calcification of the aortic  valve. Aortic valve regurgitation is moderate by color flow Doppler.  8. Pulmonic valve regurgitation is moderate to severe by color flow  Doppler.    ASSESSMENT AND PLAN:  1.  Chronic diastolic CHF: Symptomatic with PND, chronic lower extremity edema.  I will check a CBC to evaluate for anemia as he is on anticoagulation therapy and has had a history of anemia in the past.  I will increase his diuretic back to Lasix 40 mg in the a.m. and 20 mg in the p.m. for the rest of the week.  He will call us in 3 days to let us know if he is diuresed and lost some weight, and if symptoms have improved.  I advised him to take the diuretic on an empty stomach to allow for better bioavailability.  He is to reduce the salt in his foods with his wife's cooking.  I will repeat his echocardiogram to evaluate for changes in his LV function.  2.  Atrial fibrillation: Appears to be paroxysmal, as prior EKG revealed normal sinus rhythm.  Heart rate is slightly elevated today.  He is on carvedilol 6.25 mg twice daily.  Blood pressure is stable.  May need to consider increasing rate control if he continues to have some decompensation of CHF. Coumadin has been checked today.  INR of 2.9.  3.  Saint Jude mechanical mitral valve: Remains on Coumadin therapy with goal of INR between 2.5 and 3.5.  INR has been checked today with INR of 2.9.  4.  Hypertension: Remains on hydralazine 50 mg twice daily along with isosorbide as needed blood pressure greater than 681 systolic, and carvedilol.  He will continue to check blood pressures at home along with his weights.  Reducing salt in his diet.  5.  Hyperlipidemia: Continue atorvastatin 20 mg at at bedtime.  Will need to have fasting lipids and  LFTs at least every 6 months.   Current medicines are reviewed at length with the patient today.  I have spent 25 minutes dedicated to the care of this patient on the date of this  encounter to include pre-visit review of records, assessment, management and diagnostic testing,with shared decision making.  Labs/ tests ordered today include: Echocardiogram, CBC, BMET  Phill Myron. West Pugh, ANP, AACC   09/30/2019 5:24 PM    [facility] [place] [address] Office [phone] Fax [phone]  Notice: This dictation was prepared with Dragon dictation along with smaller phrase technology. Any transcriptional errors that result from this process are unintentional and may not be corrected upon review.

## 2019-09-30 ENCOUNTER — Ambulatory Visit (INDEPENDENT_AMBULATORY_CARE_PROVIDER_SITE_OTHER): Payer: BC Managed Care – PPO | Admitting: Pharmacist Clinician (PhC)/ Clinical Pharmacy Specialist

## 2019-09-30 ENCOUNTER — Other Ambulatory Visit: Payer: Self-pay

## 2019-09-30 ENCOUNTER — Encounter: Payer: Self-pay | Admitting: Adult Health

## 2019-09-30 ENCOUNTER — Ambulatory Visit (INDEPENDENT_AMBULATORY_CARE_PROVIDER_SITE_OTHER): Payer: BC Managed Care – PPO | Admitting: Adult Health

## 2019-09-30 VITALS — BP 130/70 | HR 98 | Temp 97.7°F | Ht 66.0 in | Wt 132.2 lb

## 2019-09-30 DIAGNOSIS — Z953 Presence of xenogenic heart valve: Secondary | ICD-10-CM

## 2019-09-30 DIAGNOSIS — R0602 Shortness of breath: Secondary | ICD-10-CM

## 2019-09-30 DIAGNOSIS — Z952 Presence of prosthetic heart valve: Secondary | ICD-10-CM

## 2019-09-30 DIAGNOSIS — Z7901 Long term (current) use of anticoagulants: Secondary | ICD-10-CM | POA: Diagnosis not present

## 2019-09-30 DIAGNOSIS — I350 Nonrheumatic aortic (valve) stenosis: Secondary | ICD-10-CM | POA: Diagnosis not present

## 2019-09-30 DIAGNOSIS — I480 Paroxysmal atrial fibrillation: Secondary | ICD-10-CM

## 2019-09-30 DIAGNOSIS — E785 Hyperlipidemia, unspecified: Secondary | ICD-10-CM

## 2019-09-30 DIAGNOSIS — I10 Essential (primary) hypertension: Secondary | ICD-10-CM

## 2019-09-30 DIAGNOSIS — Z79899 Other long term (current) drug therapy: Secondary | ICD-10-CM | POA: Diagnosis not present

## 2019-09-30 DIAGNOSIS — I5032 Chronic diastolic (congestive) heart failure: Secondary | ICD-10-CM

## 2019-09-30 LAB — POCT INR: INR: 2.9 (ref 2.0–3.0)

## 2019-09-30 MED ORDER — FUROSEMIDE 40 MG PO TABS: ORAL_TABLET | ORAL | 3 refills | Status: DC

## 2019-09-30 MED ORDER — FUROSEMIDE 20 MG PO TABS: ORAL_TABLET | ORAL | 6 refills | Status: DC

## 2019-09-30 NOTE — Patient Instructions (Signed)
Medication Instructions:  INCREASE- Furosemide(Lasix) 40 mg by mouth in the morning and 20 mg in the evening *If you need a refill on your cardiac medications before your next appointment, please call your pharmacy*   Lab Work: CBC, BMP  If you have labs (blood work) drawn today and your tests are completely normal, you will receive your results only by: Marland Kitchen MyChart Message (if you have MyChart) OR . A paper copy in the mail If you have any lab test that is abnormal or we need to change your treatment, we will call you to review the results.   Testing/Procedures: Your physician has requested that you have an echocardiogram. Echocardiography is a painless test that uses sound waves to create images of your heart. It provides your doctor with information about the size and shape of your heart and how well your heart's chambers and valves are working. This procedure takes approximately one hour. There are no restrictions for this procedure.   Follow-Up: At [facility], you and your health needs are our priority.  As part of our continuing mission to provide you with exceptional heart care, we have created designated Provider Care Teams.  These Care Teams include your primary Cardiologist (physician) and Advanced Practice Providers (APPs -  Physician Assistants and Nurse Practitioners) who all work together to provide you with the care you need, when you need it.  We recommend signing up for the patient portal called "MyChart".  Sign up information is provided on this After Visit Summary.  MyChart is used to connect with patients for Virtual Visits (Telemedicine).  Patients are able to view lab/test results, encounter notes, upcoming appointments, etc.  Non-urgent messages can be sent to your provider as well.   To learn more about what you can do with MyChart, go to [email or web address].    Your next appointment:   Keep appointment on August 18th @ 3:45 pm  The format for your next  appointment:   In Person  Provider:   Almyra Deforest, PA-C   Other Instructions Keep daily check of blood pressure and weight

## 2019-10-02 LAB — BASIC METABOLIC PANEL
BUN/Creatinine Ratio: 14 (ref 10–24)
BUN: 25 mg/dL (ref 8–27)
CO2: 26 mmol/L (ref 20–29)
Calcium: 8.4 mg/dL — ABNORMAL LOW (ref 8.6–10.2)
Chloride: 107 mmol/L — ABNORMAL HIGH (ref 96–106)
Creatinine, Ser: 1.85 mg/dL — ABNORMAL HIGH (ref 0.76–1.27)
GFR calc Af Amer: 39 mL/min/{1.73_m2} — ABNORMAL LOW (ref >59)
GFR calc non Af Amer: 34 mL/min/{1.73_m2} — ABNORMAL LOW (ref >59)
Glucose: 124 mg/dL — ABNORMAL HIGH (ref 65–99)
Potassium: 4.2 mmol/L (ref 3.5–5.2)
Sodium: 145 mmol/L — ABNORMAL HIGH (ref 134–144)

## 2019-10-02 LAB — CBC
Hematocrit: 31.6 % — ABNORMAL LOW (ref 37.5–51.0)
Hemoglobin: 10.1 g/dL — ABNORMAL LOW (ref 13.0–17.7)
MCH: 30.4 pg (ref 26.6–33.0)
MCHC: 32 g/dL (ref 31.5–35.7)
MCV: 95 fL (ref 79–97)
Platelets: 174 10*3/uL (ref 150–450)
RBC: 3.32 x10E6/uL — ABNORMAL LOW (ref 4.14–5.80)
RDW: 13 % (ref 11.6–15.4)
WBC: 4.8 10*3/uL (ref 3.4–10.8)

## 2019-10-03 ENCOUNTER — Telehealth: Payer: Self-pay | Admitting: Internal Medicine

## 2019-10-03 ENCOUNTER — Other Ambulatory Visit: Payer: Self-pay

## 2019-10-03 DIAGNOSIS — Z79899 Other long term (current) drug therapy: Secondary | ICD-10-CM

## 2019-10-03 NOTE — Telephone Encounter (Signed)
Called and spoke with pt's wife. Notified that per Dr.Hilty he needs an office visit and repeat labs due to his issue with CHF and anemia recently and Dr.Hilty is not sure which is contributing to his dyspnea. Spoke with Dr.Hilty through secure chat and notified that the soonest appt is with Coletta Memos on 10/13/19 at 3:15pm. Per Dr.Hilty, ok .. have him see Denyse Amass if that works - he can order whatever labs he thinks is necessary. Thanks  Able to schedule pt for appt with Coletta Memos. Also reviewed Lab results from Jory Sims from today, per Jory Sims  Edit Comments Add Notifications   I have reviewed your labs. You continue to be anemic. No significant drop in Hgb, but will need to be followed by your PCP. Your kidney function is strained with use of lasix but stable from last labs. Will need to check your labs again in one week once you change to lasix 40 mg daily after taking extra doses for the last couple of days.   KL   Notified that per Curt Bears Lawrence's last office note they were only increasing his lasix with the additional 20mg  at night for the rest of the week - ASSESSMENT AND PLAN:  1.  Chronic diastolic CHF: Symptomatic with PND, chronic lower extremity edema.  I will check a CBC to evaluate for anemia as he is on anticoagulation therapy and has had a history of anemia in the past.  I will increase his diuretic back to Lasix 40 mg in the a.m. and 20 mg in the p.m. for the rest of the week.  He will call us in 3 days to let us know if he is diuresed and lost some weight, and if symptoms have improved.   Notified that he was only supposed to be on the additional lasix for a few days and that after this per his lab results he is supposed to go back to the 40mg  of lasix daily and repeat labs in one week to see if his kidney function improves from the lowered dose of lasix.  Wife verbalized understanding. She is aware that the pt needs to go back to the 40mg  of lasix  daily, come back in one week for follow up labs, and see Coletta Memos NP on 10/13/19 at 3:15pm for follow up. Advised that if the SOB gets worse to go to the ED to be evaluated and notified that if they call out office after hours and during the weekend we have an on call provider that they can speak with. Wife thankful for the call with no other questions at this time.

## 2019-10-03 NOTE — Telephone Encounter (Signed)
Called and spoke with pt's wife per DPR. She reports that at his appt on 09/30/19 with Jory Sims they increase the pt's fluid pill to 40mg  in the am and 20mg  in the pm. Pt is still having SOB. Wife reports that he gets SOB at night when he lays down and at times he will wake up SOB. He is not currently SOB and he denies all other symptoms.  Notified I would send this message to Dr.Hilty, Jory Sims, and our pharmacist to advise and we would get back with them as soon as we could. Wife verbalized understanding with no other questions at this time.

## 2019-10-03 NOTE — Telephone Encounter (Signed)
Pt c/o Shortness Of Breath: STAT if SOB developed within the last 24 hours or pt is noticeably SOB on the phone  1. Are you currently SOB (can you hear that pt is SOB on the phone)? No  2. How long have you been experiencing SOB? About 2 and a half weeks per patient  3. Are you SOB when sitting or when up moving around? Both  4. Are you currently experiencing any other symptoms? No additional symptoms  Patient states he is calling to follow up in regards to SOB discussed during appointment on 09/30/19. He states he is not feeling any better with medication adjustment and he would like a call back to discuss what else can be done. Please call.

## 2019-10-03 NOTE — Telephone Encounter (Signed)
He probably needs an office visit and labs - he has had issues with CHF and anemia recently - not sure which is contributing to his dyspnea.  Dr Lemmie Evens

## 2019-10-05 NOTE — Telephone Encounter (Signed)
Thank you! I'm sure Denyse Amass will evaluate him further and make appropriate recommendations.  Curt Bears

## 2019-10-07 ENCOUNTER — Encounter: Payer: Self-pay | Admitting: *Deleted

## 2019-10-11 LAB — BASIC METABOLIC PANEL
BUN/Creatinine Ratio: 13 (ref 10–24)
BUN: 24 mg/dL (ref 8–27)
CO2: 25 mmol/L (ref 20–29)
Calcium: 8.4 mg/dL — ABNORMAL LOW (ref 8.6–10.2)
Chloride: 108 mmol/L — ABNORMAL HIGH (ref 96–106)
Creatinine, Ser: 1.78 mg/dL — ABNORMAL HIGH (ref 0.76–1.27)
GFR calc Af Amer: 41 mL/min/{1.73_m2} — ABNORMAL LOW (ref >59)
GFR calc non Af Amer: 35 mL/min/{1.73_m2} — ABNORMAL LOW (ref >59)
Glucose: 103 mg/dL — ABNORMAL HIGH (ref 65–99)
Potassium: 4.7 mmol/L (ref 3.5–5.2)
Sodium: 144 mmol/L (ref 134–144)

## 2019-10-12 NOTE — Progress Notes (Signed)
Cardiology Clinic Note   Patient Name: Robert [name] Date of Encounter: 10/13/2019  Primary Care Provider:  Bonnita Nasuti, MD Primary Cardiologist:  Robert Lindau, MD  Patient Profile    Robert [name] 79-year-old male presents today for follow-up of his acute on chronic CHF, atrial fibrillation, and hypertension.  Past Medical History    Past Medical History:  Diagnosis Date  . BPH (benign prostatic hyperplasia)   . Chronic anticoagulation    coumadin for mechanical valves  . CKD (chronic kidney disease), stage III   . Diabetes mellitus type 2, diet-controlled ([place])   . 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 High cholesterol   . HOH (hard of hearing)   . Hypertension   . Rheumatic heart disease   . S/P MVR (mitral valve replacement) 2015   20mm StJude mechanical valve  . Stroke [facility])    Past Surgical History:  Procedure Laterality Date  . CARDIAC CATHETERIZATION N/A 05/10/2015   Procedure: Right/Left Heart Cath and Coronary Angiography;  Surgeon: Robert Blanks, MD;  Location: [facility];  Service: Cardiovascular;  Laterality: N/A;  . COLONOSCOPY  11/22/2017   [place]    . IR ANGIO INTRA EXTRACRAN SEL COM CAROTID INNOMINATE BILAT MOD SED  12/01/2016  . IR ANGIO VERTEBRAL SEL SUBCLAVIAN INNOMINATE UNI R MOD SED  12/01/2016  . MITRAL VALVE REPLACEMENT  10/24/2013   18mm StJude mechanical valve  . PARTIAL GASTRECTOMY  2005  . SMALL BOWEL ENTEROSCOPY  11/22/2017   [facility]     Allergies  Allergies  Allergen Reactions  . Penicillins Nausea And Vomiting    08/23/2018 pt has recently tolerated Amoxicillin per pt and wife Has patient had a PCN reaction causing immediate rash, facial/tongue/throat swelling, SOB or lightheadedness with hypotension: No Has patient had a PCN reaction causing severe rash involving mucus membranes or skin necrosis: No Has patient had a PCN reaction that required hospitalization:  Unknown Has patient had a PCN reaction occurring within the last 10 years: No If all of the above answers are "NO", then may proceed with Cephalospor  . Vancomycin Other (See Comments)    Kidney problems    History of Present Illness    Robert [name] has a PMH of mitral valve stenosis status post mechanical mitral valve replacement 2015 at [facility] as result of rheumatic heart disease, on Coumadin therapy.  He does need SBE prophylaxis.  His PMH also includes hypertension, hyperlipidemia, anemia and chronic lower extremity edema.  Chronic systolic heart failure with an EF of 45% and mild to moderate nonobstructive CAD.  He also has a history of acute on chronic CKD stage III.  He was last seen by Robert Sims, DNP on 09/26/2019.  During that time his weight was stable and he had no lower extremity edema.  He stated that he became short of breath only when laying down.  He had been given instructions to increase his furosemide to 40 mg in the a.m. and 20 mg in the p.m. for 3 days.  With further assessment and was noted that his wife did cook with salt but did not add salt after food been cooked.  He denied palpitations, chest pain, dyspnea, fatigue.  Contacted nurse triage line on 10/03/2019 with increased work of breathing.  She was told to increase her furosemide to 40 mg x 3 days.  She presents to the clinic today for follow-up evaluation and states  his breathing is unchanged.  He states that he is able to do 30 minutes of walking and 30 minutes of aerobic activity daily.  He notices difficulty with breathing each night.  His Lasix was decreased to 40 mg daily in the morning and his creatinine was decreased to 1.78.  His wife states that she cooks only fresh food and does not add any salt to her diet.  He has just slight ankle edema and states that it is better in the morning when he wakes up.  He does not like to wear lower extremity support stockings because they are hot in the summer.  I will have  him present for his follow-up echocardiogram on 10/16/2019.  I will refer him to pulmonology for further lung evaluation and have him follow-up as scheduled with Robert Deforest, PA-C on October 29, 2019.  I will also increase his carvedilol to 12 . 5 in the morning and maintain his 6.25 dosing in the afternoon.  Today he denies chest pain, increased shortness of breath, increased lower extremity edema, fatigue, palpitations, melena, hematuria, hemoptysis, diaphoresis, weakness, presyncope, syncope, orthopnea, and PND.   Home Medications    Prior to Admission medications   Medication Sig Start Date End Date Taking? Authorizing Provider  amoxicillin (AMOXIL) 500 MG tablet Take 4 tablets by mouth 30-60 mins prior to dental procedure 08/23/18   Robert Pitter, PA-C  aspirin EC 81 MG tablet Take 81 mg by mouth daily.    [provider]  atorvastatin (LIPITOR) 20 MG tablet TAKE 1 TABLET BY MOUTH EVERYDAY AT BEDTIME 12/02/18   [name], Robert Corwin, MD  calcium carbonate (OSCAL) 1500 (600 Ca) MG TABS tablet Take 1,500 mg by mouth daily with breakfast.    [provider]  carvedilol (COREG) 6.25 MG tablet Take 1 tablet (6.25 mg total) by mouth 2 (two) times daily. 03/05/19   Robert [name], Robert Cover., PA-C  ferrous sulfate 325 (65 FE) MG tablet Take 1 tablet (325 mg total) by mouth 2 (two) times daily with a meal. 05/13/15   [name], Robert A, MD  furosemide (LASIX) 20 MG tablet Take 40 mg(2 tablets) in the morning and 20 mg(1 tablet) in the evening 09/30/19   Robert Colonel, NP  hydrALAZINE (APRESOLINE) 50 MG tablet Take 1 tablet (50 mg total) by mouth 2 (two) times daily. Patient taking differently: Take 25 mg by mouth 2 (two) times daily.  11/29/18   Robert [name], Robert Cover., PA-C  isosorbide mononitrate (IMDUR) 30 MG 24 hr tablet Take 1 tablet as needed for BP < 110 10/09/18   [name], Robert Corwin, MD  Multiple Vitamin (MULTIVITAMIN WITH MINERALS) TABS tablet Take 1 tablet by mouth daily.    [provider]   tamsulosin (FLOMAX) 0.4 MG CAPS capsule Take 0.8 mg by mouth daily.    [provider]  Thiamine Mononitrate (VITAMIN B1 PO) Take 1 tablet by mouth daily.     [provider]  vitamin B-12 (CYANOCOBALAMIN) 1000 MCG tablet Take 1 tablet (1,000 mcg total) by mouth daily. 05/13/15   [name], Robert A, MD  warfarin (COUMADIN) 3 MG tablet TAKE 1 TO 1 & 1/2 TABLETS BY MOUTH DAILY AS DIRECTED BY COUMADIN CLINIC 07/29/19   [name], Robert Corwin, MD    Family History    Family History  Problem Relation Age of Onset  . Hypertension Mother   . Diabetes Mother   . Cancer Brother   . Colon cancer Neg Hx   . Esophageal cancer  Neg Hx    He indicated that his mother is alive. He indicated that his father is deceased. He indicated that his sister is alive. He indicated that his brother is alive. He indicated that the status of his neg hx is unknown.  Social History    Social History   Socioeconomic History  . Marital status: Married    Spouse name: Not on file  . Number of children: Not on file  . Years of education: Not on file  . Highest education level: Not on file  Occupational History  . Occupation: Astronomer professor     Employer: [employer]  Tobacco Use  . Smoking status: Never Smoker  . Smokeless tobacco: Never Used  Substance and Sexual Activity  . Alcohol use: No    Alcohol/week: 0.0 standard drinks  . Drug use: No  . Sexual activity: Not Currently  Other Topics Concern  . Not on file  Social History Narrative   Lives in [place] with his wife.   Social Determinants of Health   Financial Resource Strain:   . Difficulty of Paying Living Expenses:   Food Insecurity:   . Worried About Charity fundraiser in the Last Year:   . Arboriculturist in the Last Year:   Transportation Needs:   . Film/video editor (Medical):   Marland Kitchen Lack of Transportation (Non-Medical):   Physical Activity:   . Days of Exercise per Week:   . Minutes of Exercise per  Session:   Stress:   . Feeling of Stress :   Social Connections:   . Frequency of Communication with Friends and Family:   . Frequency of Social Gatherings with Friends and Family:   . Attends Religious Services:   . Active Member of Clubs or Organizations:   . Attends Archivist Meetings:   Marland Kitchen Marital Status:   Intimate Partner Violence:   . Fear of Current or Ex-Partner:   . Emotionally Abused:   Marland Kitchen Physically Abused:   . Sexually Abused:      Review of Systems    General:  No chills, fever, night sweats or weight changes.  Cardiovascular:  No chest pain, dyspnea on exertion, edema, orthopnea, palpitations, paroxysmal nocturnal dyspnea. Dermatological: No rash, lesions/masses Respiratory: No cough, dyspnea Urologic: No hematuria, dysuria Abdominal:   No nausea, vomiting, diarrhea, bright red blood per rectum, melena, or hematemesis Neurologic:  No visual changes, wkns, changes in mental status. All other systems reviewed and are otherwise negative except as noted above.  Physical Exam    VS:  BP 138/82   Pulse 98   Ht 5\' 6"  (1.676 m)   Wt 135 lb 12.8 oz (61.6 kg)   SpO2 95%   BMI 21.92 kg/m  , BMI Body mass index is 21.92 kg/m. GEN: Well nourished, well developed, in no acute distress. HEENT: normal. Neck: Supple, no JVD, carotid bruits, or masses. Cardiac: RRR, no murmurs, rubs, or gallops. No clubbing, cyanosis, bilateral ankle +1 edema.  Radials/DP/PT 2+ and equal bilaterally.  Respiratory:  Respirations regular and unlabored, clear to auscultation bilaterally. GI: Soft, nontender, nondistended, BS + x 4. MS: no deformity or atrophy. Skin: warm and dry, no rash. Neuro:  Strength and sensation are intact. Psych: Normal affect.  Accessory Clinical Findings    Recent Labs: 06/09/2019: BNP 2,831.7 10/01/2019: Hemoglobin 10.1; Platelets 174 10/10/2019: BUN 24; Creatinine, Ser 1.78; Potassium 4.7; Sodium 144   Recent Lipid Panel    Component Value  Date/Time   CHOL 125 12/02/2016 0236   TRIG 27 12/02/2016 0236   HDL 45 12/02/2016 0236   CHOLHDL 2.8 12/02/2016 0236   VLDL 5 12/02/2016 0236   LDLCALC 75 12/02/2016 0236    ECG personally reviewed by me today-none today. Echocardiogram 08/02/2018 IMPRESSIONS    1. The left ventricle has low normal systolic function, with an ejection  fraction of 50-55%. The cavity size was normal. There is mildly increased  left ventricular wall thickness. Left ventricular diastolic Doppler  parameters are consistent with  impaired relaxation. No evidence of left ventricular regional wall motion  abnormalities.  2. The right ventricle has normal systolic function. The cavity was  moderately enlarged. There is no increase in right ventricular wall  thickness.  3. Left atrial size was mildly dilated.  4. A 25 mm St. Jude mechanical valve is present in the mitral position.  There is no Echo evidence of is functioning normally of the mitral  prosthesis. Normal mitral valve prosthesis.  5. Mildly thickened tricuspid valve leaflets.  6. Tricuspid valve regurgitation is moderate.  7. Mild thickening of the aortic valve. Mild calcification of the aortic  valve. Aortic valve regurgitation is moderate by color flow Doppler.  8. Pulmonic valve regurgitation is moderate to severe by color flow  Doppler.   Assessment & Plan   1.  Acute on chronic diastolic CHF-no increased work of breathing today but continues to have some orthopnea.    Further education about diastolic CHF/diet, fluid restrictions and daily weights provided. Continue furosemide, Increase carvedilol to 12. 5 in the morning and maintain 6.25 dose in the evening Heart healthy low-sodium diet-salty 6 given Daily weights-May take an extra dose of furosemide with a weight increase of 3 pounds overnight or 5 pounds in 1 week Fluid restriction-no more than 48 ounces daily. Increase physical activity as tolerated Repeat echocardiogram  is scheduled.  Shortness of breath-dyspnea with increased physical activity and orthopnea.  No changes from previous evaluation.  Appears to be a combination of CHF and pulmonary function. Consult pulmonology Keep echocardiogram appointment   Atrial fibrillation-paroxysmal in nature.  Heart rate today 98 Increase carvedilol 12.5 in a.m. and 6.2 5 in the afternoon Heart healthy low-sodium diet-salty 6 given Increase physical activity as tolerated  Essential hypertension-BP today 138/82 Well-controlled at home Continue carvedilol, hydralazine, Imdur Heart healthy low-sodium diet-salty 6 given Increase physical activity as tolerated  Status post mechanical valve replacement-continue Coumadin therapy INR goal 2.5-3.5  Hyperlipidemia-LDL 75 on 12/02/2016 Continue atorvastatin Repeat lipid and liver panel 1/22   Disposition: Follow-up with Robert Deforest, PA-C as scheduled.  Jossie Ng. Riaz Onorato NP-C    10/13/2019, 3:41 PM [facility] [address] Office [phone] Fax [phone]  Notice: This dictation was prepared with Dragon dictation along with smaller phrase technology. Any transcriptional errors that result from this process are unintentional and may not be corrected upon review.

## 2019-10-13 ENCOUNTER — Other Ambulatory Visit: Payer: Self-pay

## 2019-10-13 ENCOUNTER — Ambulatory Visit (INDEPENDENT_AMBULATORY_CARE_PROVIDER_SITE_OTHER): Payer: BC Managed Care – PPO | Admitting: General Practice

## 2019-10-13 ENCOUNTER — Other Ambulatory Visit: Payer: Self-pay | Admitting: Internal Medicine

## 2019-10-13 ENCOUNTER — Encounter: Payer: Self-pay | Admitting: General Practice

## 2019-10-13 VITALS — BP 138/82 | HR 98 | Ht 66.0 in | Wt 135.8 lb

## 2019-10-13 DIAGNOSIS — I480 Paroxysmal atrial fibrillation: Secondary | ICD-10-CM | POA: Diagnosis not present

## 2019-10-13 DIAGNOSIS — I5031 Acute diastolic (congestive) heart failure: Secondary | ICD-10-CM | POA: Diagnosis not present

## 2019-10-13 DIAGNOSIS — I10 Essential (primary) hypertension: Secondary | ICD-10-CM | POA: Diagnosis not present

## 2019-10-13 DIAGNOSIS — E785 Hyperlipidemia, unspecified: Secondary | ICD-10-CM

## 2019-10-13 DIAGNOSIS — Z952 Presence of prosthetic heart valve: Secondary | ICD-10-CM | POA: Diagnosis not present

## 2019-10-13 DIAGNOSIS — R0602 Shortness of breath: Secondary | ICD-10-CM

## 2019-10-13 MED ORDER — CARVEDILOL 6.25 MG PO TABS: 6.2500 mg | ORAL_TABLET | Freq: Two times a day (BID) | ORAL | 3 refills | Status: DC

## 2019-10-13 NOTE — Patient Instructions (Addendum)
Medication Instructions:  Increase Carvedilol 6.25mg  (12.5 mg in the morning and 6.25mg  in the evening) (2 tablets in the Morning and 1 Tablet in the Evening). Take an extra dose of Furosemide if weight gain is more than 3lbs in a Day and 5lbs. Over night *If you need a refill on your cardiac medications before your next appointment, please call your pharmacy*   Lab Work: No Labs Ordered If you have labs (blood work) drawn today and your tests are completely normal, you will receive your results only by: Marland Kitchen MyChart Message (if you have MyChart) OR . A paper copy in the mail If you have any lab test that is abnormal or we need to change your treatment, we will call you to review the results.   Testing/Procedures: Echocardiogram on October 16, 2019 ( [address])   Follow-Up: At [facility], you and your health needs are our priority.  As part of our continuing mission to provide you with exceptional heart care, we have created designated Provider Care Teams.  These Care Teams include your primary Cardiologist (physician) and Advanced Practice Providers (APPs -  Physician Assistants and Nurse Practitioners) who all work together to provide you with the care you need, when you need it.   Your next appointment:   October 27, 2019  The format for your next appointment:   In Person  Provider  Almyra Deforest     Other Instructions Referral to Pulmonary  Compression Stockings [place]  Limit Fluid intake to 48oz Daily

## 2019-10-15 ENCOUNTER — Other Ambulatory Visit: Payer: Self-pay

## 2019-10-16 ENCOUNTER — Ambulatory Visit (HOSPITAL_COMMUNITY): Payer: BC Managed Care – PPO | Attending: Internal Medicine

## 2019-10-16 ENCOUNTER — Other Ambulatory Visit: Payer: Self-pay

## 2019-10-16 DIAGNOSIS — R0602 Shortness of breath: Secondary | ICD-10-CM | POA: Insufficient documentation

## 2019-10-16 DIAGNOSIS — I350 Nonrheumatic aortic (valve) stenosis: Secondary | ICD-10-CM | POA: Insufficient documentation

## 2019-10-17 ENCOUNTER — Telehealth: Payer: Self-pay | Admitting: *Deleted

## 2019-10-17 DIAGNOSIS — Z79899 Other long term (current) drug therapy: Secondary | ICD-10-CM

## 2019-10-17 NOTE — Telephone Encounter (Signed)
-----   Message from Lendon Colonel, NP sent at 10/12/2019  7:58 PM EDT ----- Sent message in [place]

## 2019-10-17 NOTE — Telephone Encounter (Signed)
Pt aware of her blood work and medication changes, BMP ordered and mailed to pt.

## 2019-10-18 LAB — ECHOCARDIOGRAM COMPLETE
AR max vel: 2.34 cm2
AV Area VTI: 2.34 cm2
AV Area mean vel: 2.34 cm2
AV Mean grad: 3 mmHg
AV Peak grad: 4.1 mmHg
Ao pk vel: 1.01 m/s
Area-P 1/2: 1.82 cm2
P 1/2 time: 529 msec
S' Lateral: 2.9 cm

## 2019-10-21 MED ORDER — CARVEDILOL 6.25 MG PO TABS: ORAL_TABLET | ORAL | 3 refills | Status: DC

## 2019-10-21 NOTE — Addendum Note (Signed)
Addended by: Waylan Rocher on: 10/21/2019 03:21 PM   Modules accepted: Orders

## 2019-10-22 NOTE — Telephone Encounter (Signed)
Follow Up  Patient returning call for echo results. Transferred call to [facility]

## 2019-10-27 ENCOUNTER — Ambulatory Visit (INDEPENDENT_AMBULATORY_CARE_PROVIDER_SITE_OTHER): Payer: BC Managed Care – PPO

## 2019-10-27 ENCOUNTER — Other Ambulatory Visit: Payer: Self-pay

## 2019-10-27 DIAGNOSIS — Z7901 Long term (current) use of anticoagulants: Secondary | ICD-10-CM

## 2019-10-27 DIAGNOSIS — Z952 Presence of prosthetic heart valve: Secondary | ICD-10-CM

## 2019-10-27 LAB — POCT INR: INR: 3.9 — AB (ref 2.0–3.0)

## 2019-10-27 NOTE — Patient Instructions (Signed)
Take 1/2 tablet today and then Continue with 1 tablet daily except 1.5 tablets each Friday.  Repeat INR in 4 weeks

## 2019-10-29 ENCOUNTER — Ambulatory Visit (INDEPENDENT_AMBULATORY_CARE_PROVIDER_SITE_OTHER): Payer: BC Managed Care – PPO | Admitting: Physician Assistant

## 2019-10-29 ENCOUNTER — Encounter: Payer: Self-pay | Admitting: Physician Assistant

## 2019-10-29 ENCOUNTER — Other Ambulatory Visit: Payer: Self-pay

## 2019-10-29 VITALS — BP 132/76 | HR 100 | Temp 97.0°F | Ht 66.0 in | Wt 133.0 lb

## 2019-10-29 DIAGNOSIS — I480 Paroxysmal atrial fibrillation: Secondary | ICD-10-CM | POA: Diagnosis not present

## 2019-10-29 DIAGNOSIS — E785 Hyperlipidemia, unspecified: Secondary | ICD-10-CM

## 2019-10-29 DIAGNOSIS — Z952 Presence of prosthetic heart valve: Secondary | ICD-10-CM

## 2019-10-29 DIAGNOSIS — I10 Essential (primary) hypertension: Secondary | ICD-10-CM

## 2019-10-29 DIAGNOSIS — I2510 Atherosclerotic heart disease of native coronary artery without angina pectoris: Secondary | ICD-10-CM

## 2019-10-29 DIAGNOSIS — R7303 Prediabetes: Secondary | ICD-10-CM

## 2019-10-29 DIAGNOSIS — I099 Rheumatic heart disease, unspecified: Secondary | ICD-10-CM

## 2019-10-29 DIAGNOSIS — N183 Chronic kidney disease, stage 3 unspecified: Secondary | ICD-10-CM | POA: Diagnosis not present

## 2019-10-29 DIAGNOSIS — I5023 Acute on chronic systolic (congestive) heart failure: Secondary | ICD-10-CM

## 2019-10-29 NOTE — Patient Instructions (Signed)
Lab Work: HAVE LAB [place] [address] If you have labs (blood work) drawn today and your tests are completely normal, you will receive your results only by:  [place] (if you have MyChart) OR  A paper copy in the mail If you have any lab test that is abnormal or we need to change your treatment, we will call you to review the results.  Follow-Up: At [facility], you and your health needs are our priority.  As part of our continuing mission to provide you with exceptional heart care, we have created designated Provider Care Teams.  These Care Teams include your primary Cardiologist (physician) and Advanced Practice Providers (APPs -  Physician Assistants and Nurse Practitioners) who all work together to provide you with the care you need, when you need it.  Your next appointment:   11-11-2019 @ 345PM   The format for your next appointment:   In Person  Provider:   K. Mali Hilty, MD

## 2019-10-29 NOTE — Progress Notes (Signed)
Cardiology Office Note:    Date:  10/30/2019   ID:  Robert [name], DOB August 29, 1940, MRN [id number]  PCP:  Bonnita Nasuti, MD  [place] Cardiologist:  Pixie Casino, MD  [facility] Electrophysiologist:  None   Referring MD: Bonnita Nasuti, MD   Chief Complaint  Patient presents with  . Follow-up    seen for Dr. Debara Pickett    History of Present Illness:    Robert [name] is a 79 y.o. male with a hx of rheumatic heart disease, severe MS s/p mechanical MVR, CKD stage III, PAF, prediabetes, hypertension, hyperlipidemia, CVA and history of gastric cancer s/p subtotal gastrectomy 2005.  Patient underwent mechanical mitral valve replacement at [facility] in 2015.  Since then, he has been placed on Coumadin therapy.  LV function was normal prior to the surgery.  Cardiac catheterization prior to the surgery demonstrated 70% diagonal lesion, 55% LAD lesion, however no bypass surgery was performed at the time of mitral valve replacement.  [name] to worsening symptom of shortness of breath, she underwent left and right heart cath on 05/10/2015 which showed 20% proximal RCA lesion, 30% proximal to mid LAD lesion, 40% mid LAD lesion, 50% ostial D1 lesion, 30% ostial D2 lesion.  Medical management was recommended.  Right heart cath demonstrated elevated filling pressures suggestive of volume overload.  Patient underwent IV diuresis.  Echocardiogram obtained at the time showed EF down to 45 to 50%.  Post cath course was complicated by AKI and hyperkalemia.  Myoview obtained on 04/13/2016 showed EF 45%, no ischemia or infarction.  Last echocardiogram performed on 08/02/2018 showed EF 50 to 55%, 25 mm Saint Jude mechanical mitral valve was normal valve function, moderate TR, moderate AI, moderate to severe pulmonic regurgitation.    Patient was seen by Bunnie Domino, NP on 09/30/2019 with 2 weeks onset of worsening orthopnea.  Prior to arrival, he tried 40 mg a.m. and 20 mg p.m. of diuretic for 3 days however  when he returned to 20 mg twice a day, his symptoms recurred.  He was in atrial fibrillation during the visit, this appears to be new for him.  Most recently, patient has been seen by Coletta Memos, NP on 10/13/2019 with increased work of breathing.  His carvedilol was increased to 12.5 mg in the morning and 6.2 5 at night.  Patient was also referred to pulmonology service.  Repeat echocardiogram obtained on 10/16/2019 showed EF down to 35 to 40%, with beat to beat variability in atrial fibrillation with heart rate of approximately 100 bpm, moderately decreased LV function, moderate RV dysfunction with RVSP 51.4 mmHg, mild mitral regurgitation was mechanical St Jude mitral valve prosthesis present, moderate TR, mild AI.  He presents today for follow-up along with his wife.  He denies any recent chest pain.  He is currently on 40 mg a.m. and 20 mg p.m. of Lasix.  Recent lab work shows stable renal function and electrolyte.  He is [name] for repeat blood work near the end of this month.  Talking with the patient, he says his breathing is better, however he still has 1-2+ pitting edema in his ankle area.  He also has mild diminished breath sounds in the left base of the lung, right lung appears to be normal.  He still occasionally have shortness of breath laying down as well.  I suspect his volume overload was driven by uncontrolled heart rate while in the atrial fibrillation.  This could likely explain his recent drop  in the ejection fraction as well.  Unfortunately he does not feel the palpitation.  We discussed possibility of cardioversion, however he is adamant this will need to be discussed between Dr. Debara Pickett and his brother who is a Energy manager.  I am hesitant to further uptitrate his heart failure medication at this time as he has history of recurrent dehydration on higher dose of diuretic.  I will discuss the case with Dr. Debara Pickett either today or tomorrow.  His wife also asked for a face-to-face appointment  with Dr. Debara Pickett at the earliest possible date.   Past Medical History:  Diagnosis Date  . BPH (benign prostatic hyperplasia)   . Chronic anticoagulation    coumadin for mechanical valves  . CKD (chronic kidney disease), stage III   . Diabetes mellitus type 2, diet-controlled ([place])   . Gastric cancer ([place]) 2005   s/p gastrectomy  . High cholesterol   . HOH (hard of hearing)   . Hypertension   . Rheumatic heart disease   . S/P MVR (mitral valve replacement) 2015   41mm StJude mechanical valve  . Stroke [facility])     Past Surgical History:  Procedure Laterality Date  . CARDIAC CATHETERIZATION N/A 05/10/2015   Procedure: Right/Left Heart Cath and Coronary Angiography;  Surgeon: Burnell Blanks, MD;  Location: [place] [facility];  Service: Cardiovascular;  Laterality: N/A;  . COLONOSCOPY  11/22/2017   [place]    . IR ANGIO INTRA EXTRACRAN SEL COM CAROTID INNOMINATE BILAT MOD SED  12/01/2016  . IR ANGIO VERTEBRAL SEL SUBCLAVIAN INNOMINATE UNI R MOD SED  12/01/2016  . MITRAL VALVE REPLACEMENT  10/24/2013   108mm StJude mechanical valve  . PARTIAL GASTRECTOMY  2005  . SMALL BOWEL ENTEROSCOPY  11/22/2017   [facility]     Current Medications: Current Meds  Medication Sig  . amoxicillin (AMOXIL) 500 MG tablet Take 4 tablets by mouth 30-60 mins prior to dental procedure  . aspirin EC 81 MG tablet Take 81 mg by mouth daily.  Marland Kitchen atorvastatin (LIPITOR) 20 MG tablet TAKE 1 TABLET BY MOUTH EVERYDAY AT BEDTIME  . calcium carbonate (OSCAL) 1500 (600 Ca) MG TABS tablet Take 1,500 mg by mouth daily with breakfast.  . carvedilol (COREG) 6.25 MG tablet Take 2 tablets (12.5 mg total) by mouth every morning AND 1 tablet (6.25 mg total) every evening. 12.5 mg in the morning and 6.25mg  in the evening) (2 tablets in the Morning and 1 Tablet in the Evening).  . ferrous sulfate 325 (65 FE) MG tablet Take 1 tablet (325 mg total) by mouth 2 (two) times daily with a meal.  .  furosemide (LASIX) 20 MG tablet Take 40 mg(2 tablets) in the morning and 20 mg(1 tablet) in the evening  . hydrALAZINE (APRESOLINE) 25 MG tablet Take 25 mg by mouth 2 (two) times daily.  . Multiple Vitamin (MULTIVITAMIN WITH MINERALS) TABS tablet Take 1 tablet by mouth daily.  . tamsulosin (FLOMAX) 0.4 MG CAPS capsule Take 0.8 mg by mouth daily.  . vitamin B-12 (CYANOCOBALAMIN) 1000 MCG tablet Take 1 tablet (1,000 mcg total) by mouth daily.  Marland Kitchen warfarin (COUMADIN) 3 MG tablet TAKE 1 TO 1 & 1/2 TABLETS BY MOUTH DAILY AS DIRECTED BY COUMADIN CLINIC     Allergies:   Penicillins and Vancomycin   Social History   Socioeconomic History  . Marital status: Married    Spouse name: Not on file  . Number of children: Not on  file  . Years of education: Not on file  . Highest education level: Not on file  Occupational History  . Occupation: Astronomer professor     Employer: [employer]  Tobacco Use  . Smoking status: Never Smoker  . Smokeless tobacco: Never Used  Substance and Sexual Activity  . Alcohol use: No    Alcohol/week: 0.0 standard drinks  . Drug use: No  . Sexual activity: Not Currently  Other Topics Concern  . Not on file  Social History Narrative   Lives in [place] with his wife.   Social Determinants of Health   Financial Resource Strain:   . Difficulty of Paying Living Expenses: Not on file  Food Insecurity:   . Worried About Charity fundraiser in the Last Year: Not on file  . Ran Out of Food in the Last Year: Not on file  Transportation Needs:   . Lack of Transportation (Medical): Not on file  . Lack of Transportation (Non-Medical): Not on file  Physical Activity:   . Days of Exercise per Week: Not on file  . Minutes of Exercise per Session: Not on file  Stress:   . Feeling of Stress : Not on file  Social Connections:   . Frequency of Communication with Friends and Family: Not on file  . Frequency of Social Gatherings with Friends and Family: Not on  file  . Attends Religious Services: Not on file  . Active Member of Clubs or Organizations: Not on file  . Attends Archivist Meetings: Not on file  . Marital Status: Not on file     Family History: The patient's family history includes Cancer in his brother; Diabetes in his mother; Hypertension in his mother. There is no history of Colon cancer or Esophageal cancer.  ROS:   Please see the history of present illness.     All other systems reviewed and are negative.  EKGs/Labs/Other Studies Reviewed:    The following studies were reviewed today:  Echo 10/16/2019 1. Left ventricular ejection fraction, by estimation, is 35 to 40% with  beat to beat variability in atrial fibrillation with heart rate  approximately 100 bpm. The left ventricle has moderately decreased  function. The left ventricle demonstrates global  hypokinesis. There is moderate left ventricular hypertrophy. Left  ventricular diastolic parameters are indeterminate.  2. Right ventricular systolic function is moderately reduced. The right  ventricular size is mildly enlarged. There is moderately elevated  pulmonary artery systolic pressure. The estimated right ventricular  systolic pressure is 73.4 mmHg.  3. Left atrial size was severely dilated.  4. Right atrial size was severely dilated.  5. The mitral valve has been repaired/replaced. Mild mitral valve  regurgitation, likely normal washing jets. Assessment limited by shadowing  from prosthesis. There is a 25 mm St. Jude mechanical valve present in the  mitral position. Procedure Date:  10/24/2013 Dr. Evelina Dun, [place]. MV mean diastolic gradient 4 mmHg at HR 100  bpm. PHT 79 msec, DVI 2.3, iEOA 1.17 cm2/m2, Vmax 1.6 m/s. Gradient is  slightly above normal range for size and valve type, at elevated HR. These  findings in combination suggest  likely normal mechanical prosthesis function.  6. Tricuspid valve regurgitation is moderate.  7. The aortic valve  is tricuspid. Aortic valve regurgitation is mild. No  aortic stenosis is present.  8. The inferior vena cava is normal in size with greater than 50%  respiratory variability, suggesting right atrial pressure of 3 mmHg.  Comparison(s): A prior study was performed on 08/02/2018. Prior images  reviewed side by side. LVEF has decreased. Consider reduced EF in setting  of arrhythmia.   EKG:  EKG is ordered today.  The ekg ordered today demonstrates atrial fibrillation, heart rate 94 bpm  Recent Labs: 06/09/2019: BNP 2,831.7 10/01/2019: Hemoglobin 10.1; Platelets 174 10/10/2019: BUN 24; Creatinine, Ser 1.78; Potassium 4.7; Sodium 144  Recent Lipid Panel    Component Value Date/Time   CHOL 125 12/02/2016 0236   TRIG 27 12/02/2016 0236   HDL 45 12/02/2016 0236   CHOLHDL 2.8 12/02/2016 0236   VLDL 5 12/02/2016 0236   LDLCALC 75 12/02/2016 0236    Physical Exam:    VS:  BP 132/76   Pulse 100   Temp (!) 97 F (36.1 C)   Ht 5\' 6"  (1.676 m)   Wt 133 lb (60.3 kg)   SpO2 98%   BMI 21.47 kg/m     Wt Readings from Last 3 Encounters:  10/29/19 133 lb (60.3 kg)  10/13/19 135 lb 12.8 oz (61.6 kg)  09/30/19 132 lb 3.2 oz (60 kg)     GEN:  Well nourished, well developed in no acute distress HEENT: Normal NECK: No JVD; No carotid bruits LYMPHATICS: No lymphadenopathy CARDIAC: Irregularly irregular, no murmurs, rubs, gallops RESPIRATORY: Right lung clear to auscultation, diminished breath sounds in the left base ABDOMEN: Soft, non-tender, non-distended MUSCULOSKELETAL: 1-2+ pitting edema in bilateral ankle; No deformity  SKIN: Warm and dry NEUROLOGIC:  Alert and oriented x 3 PSYCHIATRIC:  Normal affect   ASSESSMENT:    1. Paroxysmal atrial fibrillation (HCC)   2. H/O mitral valve replacement with mechanical valve   3. Rheumatic heart disease   4. Stage 3 chronic kidney disease, unspecified whether stage 3a or 3b CKD   5. Essential hypertension   6. Hyperlipidemia, unspecified  hyperlipidemia type   7. Prediabetes    PLAN:    In order of problems listed above:  1. Paroxysmal atrial fibrillation: This appears to be new since 09/30/2019.  Previous EKG showed sinus bradycardia with heart rate in the high 50s however since July, his heart rate has been in the high 90s to low 100 range.  Repeat EKG today confirms he is still in atrial fibrillation.  I think his A. fib likely has caused his EF to drop and recent volume overload.  I recommended consideration of DC cardioversion however patient wished to have this discussed between Dr. Debara Pickett and his brother who is a Energy manager.  Fortunately, he is already on Coumadin prior to occurrence of atrial fibrillation.  2. Acute on chronic systolic heart failure: Recent echocardiogram showed EF dropped from previous 50% down to 35%.  There was some discussion about cardiac catheterization, however there is high likelihood that recent drop in the ejection fraction is caused by the new onset of atrial fibrillation and tachycardia.  I would recommend control atrial fibrillation first then repeat echocardiogram after a few months.  Patient denies any recent chest pain  3. CAD: Previous cardiac catheterization in 2017 showed nonobstructive disease.  4. History of mechanical mitral valve replacement: Appears to be stable on recent echocardiogram.  Continue Coumadin.  SBE prophylaxis  5. History of rheumatic heart disease: Which led to mitral valve disease and mitral valve repair.  6. Hypertension: Blood pressure stable on current therapy.  Carvedilol was recently increased in attempt to rate control his A. fib, however heart rate has not changed.  7. Hyperlipidemia: Continue Lipitor  8. Prediabetes: Diet controlled  9. CKD stage III: Stable on recent lab work.  [name] for repeat basic metabolic panel around 5/92   Medication Adjustments/Labs and Tests Ordered: Current medicines are reviewed at length with the patient today.   Concerns regarding medicines are outlined above.  Orders Placed This Encounter  Procedures  . EKG 12-Lead   No orders of the defined types were placed in this encounter.   Patient Instructions  Lab Work: HAVE LAB WORK BEFORE APPOINTMENT 8-31 If you have labs (blood work) drawn today and your tests are completely normal, you will receive your results only by: Marland Kitchen MyChart Message (if you have MyChart) OR . A paper copy in the mail If you have any lab test that is abnormal or we need to change your treatment, we will call you to review the results.  Follow-Up: At [facility], you and your health needs are our priority.  As part of our continuing mission to provide you with exceptional heart care, we have created designated Provider Care Teams.  These Care Teams include your primary Cardiologist (physician) and Advanced Practice Providers (APPs -  Physician Assistants and Nurse Practitioners) who all work together to provide you with the care you need, when you need it.  Your next appointment:   11-11-2019 @ 345PM   The format for your next appointment:   In Person  Provider:   K. Mali Hilty, MD     Signed, Almyra Deforest, [place]  10/30/2019 8:57 AM    [place]

## 2019-10-30 ENCOUNTER — Telehealth: Payer: Self-pay | Admitting: Internal Medicine

## 2019-10-30 ENCOUNTER — Telehealth: Payer: Self-pay

## 2019-10-30 ENCOUNTER — Encounter: Payer: Self-pay | Admitting: Physician Assistant

## 2019-10-30 DIAGNOSIS — I480 Paroxysmal atrial fibrillation: Secondary | ICD-10-CM

## 2019-10-30 DIAGNOSIS — Z79899 Other long term (current) drug therapy: Secondary | ICD-10-CM

## 2019-10-30 DIAGNOSIS — E785 Hyperlipidemia, unspecified: Secondary | ICD-10-CM

## 2019-10-30 MED ORDER — AMIODARONE HCL 200 MG PO TABS
ORAL_TABLET | ORAL | 3 refills | Status: AC
Start: 2019-10-30 — End: 2020-11-12

## 2019-10-30 NOTE — Telephone Encounter (Signed)
Verbal order from MD - also needs baseline TSH and LFT with initiation of amiodarone therapy.   Patient would be due for cardioversion ~ week of 12/01/2019 - Dr. Debara Pickett is rounding this week and can do on Monday, Tuesday, Thursday or Friday of this week

## 2019-10-30 NOTE — Telephone Encounter (Signed)
Spoke with patient/wife and reviewed medication changes. Explained need for TSH/LFT lab test. He will come by tomorrow for this.   Patient would like to do procedure 12/05/2019  Spoke with Delilah Shan with scheduling to arrange cardioversion  12/05/2019 with Dr. Debara Pickett @ 10am (9am arrival) Case ID: [id number]

## 2019-10-30 NOTE — Telephone Encounter (Signed)
-----   Message from Pixie Casino, MD sent at 10/30/2019  1:27 PM EDT ----- Regarding: DCCV Please start amiodarone 200 mg BID for 2 weeks, then reduce to 200 mg daily. Will need weekly INR checks for the next 3 weeks. Try to arrange cardioversion in 1 month from now - also, has appt on 9/3 that needs to be cancelled - keep appt with me on 8/31.  Dr. Lemmie Evens

## 2019-10-30 NOTE — Telephone Encounter (Signed)
Called and scheduled all coumadin appts for dccv pt voiced understanding

## 2019-11-04 ENCOUNTER — Ambulatory Visit (INDEPENDENT_AMBULATORY_CARE_PROVIDER_SITE_OTHER): Payer: BC Managed Care – PPO | Admitting: Pharmacist

## 2019-11-04 ENCOUNTER — Other Ambulatory Visit: Payer: Self-pay

## 2019-11-04 DIAGNOSIS — Z952 Presence of prosthetic heart valve: Secondary | ICD-10-CM

## 2019-11-04 DIAGNOSIS — Z7901 Long term (current) use of anticoagulants: Secondary | ICD-10-CM | POA: Diagnosis not present

## 2019-11-04 LAB — POCT INR: INR: 3.6 — AB (ref 2.0–3.0)

## 2019-11-04 NOTE — Patient Instructions (Signed)
Description   Take 1 tablet daily except for 1.5 tablets on Friday.  Recheck INR in 1 week

## 2019-11-05 ENCOUNTER — Ambulatory Visit: Payer: BC Managed Care – PPO | Admitting: Internal Medicine

## 2019-11-11 ENCOUNTER — Other Ambulatory Visit: Payer: Self-pay

## 2019-11-11 ENCOUNTER — Ambulatory Visit (INDEPENDENT_AMBULATORY_CARE_PROVIDER_SITE_OTHER): Payer: BC Managed Care – PPO | Admitting: Internal Medicine

## 2019-11-11 ENCOUNTER — Encounter: Payer: Self-pay | Admitting: Internal Medicine

## 2019-11-11 ENCOUNTER — Ambulatory Visit (INDEPENDENT_AMBULATORY_CARE_PROVIDER_SITE_OTHER): Payer: BC Managed Care – PPO | Admitting: Pharmacist Clinician (PhC)/ Clinical Pharmacy Specialist

## 2019-11-11 VITALS — BP 171/96 | HR 73 | Ht 66.0 in | Wt 132.8 lb

## 2019-11-11 DIAGNOSIS — Z7901 Long term (current) use of anticoagulants: Secondary | ICD-10-CM

## 2019-11-11 DIAGNOSIS — I5023 Acute on chronic systolic (congestive) heart failure: Secondary | ICD-10-CM

## 2019-11-11 DIAGNOSIS — Z952 Presence of prosthetic heart valve: Secondary | ICD-10-CM | POA: Diagnosis not present

## 2019-11-11 DIAGNOSIS — I4819 Other persistent atrial fibrillation: Secondary | ICD-10-CM

## 2019-11-11 DIAGNOSIS — Z01812 Encounter for preprocedural laboratory examination: Secondary | ICD-10-CM | POA: Diagnosis not present

## 2019-11-11 LAB — POCT INR: INR: 6.6 — AB (ref 2.0–3.0)

## 2019-11-11 MED ORDER — FUROSEMIDE 20 MG PO TABS
40.0000 mg | ORAL_TABLET | Freq: Two times a day (BID) | ORAL | 3 refills | Status: DC
Start: 2019-11-11 — End: 2019-12-16

## 2019-11-11 MED ORDER — FUROSEMIDE 20 MG PO TABS
40.0000 mg | ORAL_TABLET | Freq: Two times a day (BID) | ORAL | 3 refills | Status: DC
Start: 2019-11-11 — End: 2019-11-11

## 2019-11-11 NOTE — Progress Notes (Signed)
OFFICE NOTE  Chief Complaint:  Follow-up afib  Primary Care Physician: Bonnita Nasuti, MD  HPI:  Robert [name] is a 79 y.o. 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 He has been followed by Dr. Geraldo Pitter at [facility] cardiology and was referred to Dr. Evelina Dun at [facility] in 2013 for evaluation of his mitral stenosis. He was followed clinically and underwent mechanical mitral valve replacement in 2015. Since then he's been compliant on warfarin and has had therapeutic to supratherapeutic INRs. LV function was normal prior to surgery however we cannot find echocardiographic results after his surgery. A heart catheterization performed prior to surgery demonstrated a 70% diagonal stenosis and 55% LAD stenosis, but he did not have any bypass surgery. He now presents with one week of progressive shortness of breath, orthopnea and leg edema concerning for heart failure. He seems to be responding to IV Lasix. Chest x-ray shows COPD changes, small bilateral pleural effusions and some basilar atelectasis. Labs indicate markedly elevated BNP of 1406 and troponin is negative 3. There appears to be a mild iron deficiency anemia with H&H of 9 and 29. INR initially was 4.37 that came down to 3.98. He was admitted and treated for heart failure with diuresis. He underwent right and left heart catheterization which demonstrated the following:   Prox RCA lesion, 20% stenosed.  Prox LAD to Mid LAD lesion, 30% stenosed.  Mid LAD lesion, 40% stenosed.  Ost 1st Diag to 1st Diag lesion, 50% stenosed.  Ost 2nd Diag lesion, 30% stenosed.  1. Mild to moderate non-obstructive CAD 2. Normal movement of mechanical mitral valve leaflets.  3. Elevated filling pressures suggesting residual volume overload.   He also had an echocardiogram which showed a newly reduced LVEF of 45-50%. At  discharge he was scheduled to be on Lasix 60 mg daily, and increase from his prior home dose of 40 mg daily. It was also recommended that he discontinue valsartan and spironolactone. After discharge he had routine lab work which demonstrated acute renal failure and creatinine had increased from 1.8-2.9. He was also markedly hyperkalemic at 5.7. This was brought to the attention of the [facility] DOD Dr. Marlou Porch, who recommended hydration and avoiding potassium with a repeat metabolic profile the next day. The repeat metabolic profile showed creatinine had decreased to 2.7 and potassium was 5.3. Since discharge Mr. Mccurdy had poor appetite, weight has remained stable and he is felt weak and had poor energy. Much worse than when he was discharged. I spent a good deal of time reviewing the patient's medications with his wife who had a 3 x 5 note card with his previous medications on the front and new medications on the rear. She tells me that he is actually taking all of these medications, therefore he is taking his old dose of Lasix 40 mg in addition to Lasix 60 mg daily as well as spironolactone and valsartan. Certainly explain his acute renal failure as well as hyperkalemia. I reviewed his discharge medication summary which clearly states to discontinue valsartan and Lasix and noted the dose changes of medications. He was seen in his primary care doctor's office after discharge however it appears that these medication changes were not identified. In addition, his INR is markedly elevated today at 6.2. This is up from 3.5 where it was 1 week ago. I suspect again this is related to renal failure, decreased appetite  and nutritional reasons. Medication noncompliance could be playing a role.  [name] returns today for follow-up. Since we sorted out his medications he's done much better. His creatinine has gone back down to baseline of 1.8. He is on iron and B12 and his hemoglobin and hematocrit of come up to 10.7 and  32. He reports marked improvement in his energy. He does say that he has problems with decreased energy and sleep although he feels like he gets a restful night sleep. He denies any snoring or apnea. He wakes up fairly early in the morning and does yoga and often takes a nap in the morning. Unfortunately his INR became subtherapeutic after holding his warfarin for coagulopathy. On the 20th it was 1.4. Today his INR is 1.8.  10/18/2015  [name] returns today for follow-up of 2 recent hospitalizations. He was seen at [facility] and subsequently at [facility] a couple weeks later for small bowel obstruction. He does have a history of gastric cancer and is status post subtotal gastrectomy in 2005. Unfortunately he has not been able to eat and has lost a significant amount of weight. He is currently 119 pounds with a BMI of 19 and does feel somewhat weak. Fortunately, however his LV function has improved back to 55-60% by echo. He was seen in consultation and no further cardiac workup was recommended as he did have some mildly elevated troponins. Systolic to be nonspecific. He's had no problems with his mechanical mitral valve and INR today is finally controlled at 3.5. He tells me he is interested in getting some dental work including having several teeth pulled and having a denture placed at [facility] in the near future.  04/07/2016  [name] presents today with some recent dyspnea on exertion. He says over the past 2 weeks she's become short of breath walking up stairs and now walking on a flat surface. EF had improved recently up to 55-60% by echo, however this was during hospitalization for a small bowel obstruction. At the time he did have elevated troponins but was felt due to a noncardiac etiology. He denies any chest pain however his progressive dyspnea and fatigue with exertion could be due to coronary ischemia. He was noted to have moderate nonobstructive coronary disease by cath more than a  year ago.  04/18/2016  Mr. Rakes was seen back today in follow-up. He reports some continued shortness of breath and now some lower strandy swelling. He underwent a Lexiscan Myoview which showed no ischemia however EF was back to 45%. He has had 7 pound weight gain since I last saw him consistent with acute systolic congestive heart failure. He is only on Lasix 20 mg every other day.  05/15/2016  Mr. Riva returns today for follow-up. Blood pressure is elevated 156/70. He reports that increasing his Lasix has helped with some of his shortness of breath and abdominal swelling. Unfortunately did not get blood work drawn as I had requested. He is due for repeat metabolic profile and BNP. We discussed briefly the addition of Entresto he may be a good candidate for that. I like to evaluate his labs today and make a determination based on his renal function. He also is reported recently a decrease in appetite and early satiety, particularly with breads and other foods that he cannot tolerate. This sounds like more of a GI issue and I've encouraged him to follow-up with his gastroenterologist in [place].  06/06/2016  Mr. Mayeux was seen today in follow-up.  He reports breathing improved significantly as well as the swelling reduced after adding a second dose of diuretics to his regimen. Currently he is on Lasix 40 mg twice a day. Weight is gone down from 131-127 pounds. He has no lower extremity edema. In fact he is close to underweight, more consistent with the fact that his diet is very picky and he continues to have problems with his GI system. His wife says that he eats very scant portions. The pressure is well-controlled today. He has not had repeat lab work which I recommended to reassess his creatinine. In the past his creatinine is been somewhat higher on increased dose of diuretics, but we may need to allow this. I'd also like to see if his chronic kidney disease would support the use of  Entresto.  12/07/2016  Mr. 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 It was thought this is related to a mechanical mitral valve and being subtherapeutic with his INR. He is confused as to recently as soon as 2 weeks prior to the incident his INR was well controlled. He had reported problems with eating and was being treated by gastroenterologist for what sounds like SIBO. It's possible that the combination of his antibiotics including Cipro and Flagyl, particularly the latter, may have affected his INR. His admission INR was 2.31 however subsequent INRs were 1.74 and 1.35. He is now on twice-daily Lovenox bridging and we are working to reestablish a therapeutic INR. Fortunately he seems to have some mild deficits with mild left facial droop and some dysarthria.  03/14/2017  Mr. Crum was seen today in follow-up.  He has had no further stroke or TIA symptoms.  He saw Dr. Erlinda Hong in the fall who felt like he was doing well.  We have managed to keep his INR in the 3-3.5 range.  He has been more compliant with his medication.  He now gets INR checks every 2 weeks.  Recently his blood pressure has been running higher.  His carvedilol was increased from 6.25 twice a day to 12.5 twice a day.  His wife reports no significant change in his blood pressure with this.  At times he gets a little dizzy which may be associated with his high blood pressure.  04/26/2017  Mr. Chalfin was seen today in follow-up.  We have been working to improve his blood pressures.  For some reason they have increased recently.  He has been placed on hydralazine 25 mg 3 times daily in addition to his other medications and pressure today was 124/60.  Interestingly number blood pressures at home have ranged between 885 and 027 systolic.  He does have a relatively new blood  pressure cuff which is only 33 months old.  He is also complaining of shortness of breath today.  He says over the past several weeks he has been more short of breath with exertion, particularly walking up inclines or stairs.  Weight seems to be increased about 10 pounds over the past 2-4 weeks.  He reports some pitting edema of his ankles.  06/07/2017  Mr. Goostree returns for follow-up.  His INR was checked today was slightly elevated at 4.0.  His weight has continued to trend down.  He is not eating as much is he has been recently.  He has been using increased dose Lasix 40 mg daily and reports  improvement in breathing and swelling.  This is at the cost of a slight increase in creatinine which were tolerating.  He is also been anemic, which appears to be related to chronic blood loss in the setting of anticoagulation.  He seen GI and is being seen by hematology within the [place] [facility].  Of note he is bradycardic today with heart rates in the 40s.  This is consistently trended down over the past month and seems to be associated with his weight loss which is down 7 pounds.  11/27/2017  Mr. Sawin is seen today in follow-up.  Overall he seems to be doing well.  He had a repeat INR today which was 2.7.  He recently went on Lovenox for a colonoscopy which was negative.  He does report lower extremity edema.  He says he has some discomfort he notes his legs after standing for several hours.  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  It tends to be worse at the end of the day.  He notes that after elevating his feet at night the symptoms improve.  Weight has actually been fairly stable.  06/16/2019  Mr. Murch returns today for follow-up.  Actually have seen him numerous times via telemedicine visit however recently he was noted to have a supratherapeutic INR.  This is after an infection for which he was receiving antibiotics and steroids.  His INR was over 8.  Subsequently warfarin was held and then he  became subtherapeutic with INR of 1.3.  His PCP checked at 1.2.  Today it was rechecked at 2.4.  Of note his labs a week ago though showed that he has a significant anemia.  Hemoglobin was 8.3 with hematocrit of 25.6.  This compared to labs we had 2 years ago which showed a hemoglobin of 12.4 and hematocrit 37.5.  I was able to review labs from his PCP in February 2021 which showed a hemoglobin of 10 indicating about a two-point drop.  He denies any melena or any bright red blood per rectum.  He has had no blood in the urine.  He reportedly had had a colonoscopy a couple years ago which did not show any significant disease.  He does have a mitral valve replacement which is mechanical.  His INR targets are between 3 and 3.5 due to his history of stroke as well.  I did as his normal), hemoglobin 8.6, RBC 2.88, hematocrit to 27.7  his PCP to evaluate him regarding this anemia which could be chronic as he has chronic kidney disease, could be related to iron deficiency or other etiologies.  Essentially they ordered lab work but have not heard back from the provider regarding these results.  We were able to obtain that today and I personally reviewed it.  This demonstrated an adequate B12 level over 1610, high folic acid level, PTT of 26, serum ferritin 147, hematocrit 27.7 with a normal MCV.  Iron studies are as follows: Iron saturation 24, serum iron 70, U IBC 227, TIBC 297 (all normal).  Reticulocyte count was 5.3 indicating adequate bone marrow response.  Haptoglobin was low at less than 10.  LDH was elevated 482.  Creatinine had improved somewhat at 1.67 (he had recent increase in his diuretics).  Despite this lab work, he reports continued fatigue and decrease in energy.  He says weakness is present in his legs.  He did not have a stool card because it was not available however they intend to get that  to see if there is any evidence of occult bleeding.  07/31/2019  Mr. Ridlon returns today for follow-up.  He says  he is feeling overall little better.  His hemoglobin has increased from 8-10 without any transfusion.  His INR has been better controlled although was elevated today at 4.4.  This will be further adjusted by her pharmacist.  He does have a follow-up with [facility], Dr. Lyndel Safe in [facility] for further evaluation and what sounds like a capsule endoscopy.  He has had some instability weakness in his legs.  His wife is requesting a prescription for a cane.  I have encouraged some more strengthening and physical activity.  In addition he should wear compression stockings for his persistent ankle/foot edema.  I he appears euvolemic today and weight has been stable.  He is currently taking Lasix 40 mg daily.  11/11/2019  Mr. Morath seen today in follow-up.  Recently he was noted to have atrial fibrillation which is new onset.  His recent decline in LVEF to 35 to 40% with moderate RV dysfunction and RVSP of 51 mmHg.  There is severe biatrial enlargement.  The mitral prosthesis appears to be working properly.  He reports increasing fatigue and some recent worsening swelling.  This is likely decompensated heart failure.  He is on warfarin with a therapeutic INR and we discussed the possibility of a cardioversion.  My feeling is that he has a low likelihood of conversion given severe biatrial enlargement without antiarrhythmic therapy.  Based on that I recommended starting amiodarone, initially 200 mg twice daily for 2 weeks then 200 mg daily.  In addition, we have been checking weekly INRs and if he remains therapeutic, will proceed with elective cardioversion on September 24.  EKG today shows persistent A. fib with controlled ventricular response.  PMHx:  Past Medical History:  Diagnosis Date  . BPH (benign prostatic hyperplasia)   . Chronic anticoagulation    coumadin for mechanical valves  . CKD (chronic kidney disease), stage III   . Diabetes mellitus type 2, diet-controlled ([place])   . Gastric cancer ([place]) 2005    s/p gastrectomy  . High cholesterol   . HOH (hard of hearing)   . Hypertension   . Rheumatic heart disease   . S/P MVR (mitral valve replacement) 2015   94mm StJude mechanical valve  . Stroke [facility])     Past Surgical History:  Procedure Laterality Date  . CARDIAC CATHETERIZATION N/A 05/10/2015   Procedure: Right/Left Heart Cath and Coronary Angiography;  Surgeon: Burnell Blanks, MD;  Location: [place] [facility];  Service: Cardiovascular;  Laterality: N/A;  . COLONOSCOPY  11/22/2017   [facility]    . IR ANGIO INTRA EXTRACRAN SEL COM CAROTID INNOMINATE BILAT MOD SED  12/01/2016  . IR ANGIO VERTEBRAL SEL SUBCLAVIAN INNOMINATE UNI R MOD SED  12/01/2016  . MITRAL VALVE REPLACEMENT  10/24/2013   33mm StJude mechanical valve  . PARTIAL GASTRECTOMY  2005  . SMALL BOWEL ENTEROSCOPY  11/22/2017   [facility]     FAMHx:  Family History  Problem Relation Age of Onset  . Hypertension Mother   . Diabetes Mother   . Cancer Brother   . Colon cancer Neg Hx   . Esophageal cancer Neg Hx     SOCHx:   reports that he has never smoked. He has never used smokeless tobacco. He reports that he does not drink alcohol and does not use drugs.  ALLERGIES:  Allergies  Allergen Reactions  . Penicillins Nausea And Vomiting    08/23/2018 pt has recently tolerated Amoxicillin per pt and wife Has patient had a PCN reaction causing immediate rash, facial/tongue/throat swelling, SOB or lightheadedness with hypotension: No Has patient had a PCN reaction causing severe rash involving mucus membranes or skin necrosis: No Has patient had a PCN reaction that required hospitalization: Unknown Has patient had a PCN reaction occurring within the last 10 years: No If all of the above answers are "NO", then may proceed with Cephalospor  . Vancomycin Other (See Comments)    Kidney problems    ROS: Pertinent items noted in HPI and remainder of comprehensive ROS otherwise  negative.  HOME MEDS: Current Outpatient Medications  Medication Sig Dispense Refill  . amiodarone (PACERONE) 200 MG tablet Take 1 tablet (200 mg total) by mouth 2 (two) times daily for 14 days, THEN 1 tablet (200 mg total) daily. 135 tablet 3  . aspirin EC 81 MG tablet Take 81 mg by mouth daily.    Marland Kitchen atorvastatin (LIPITOR) 20 MG tablet TAKE 1 TABLET BY MOUTH EVERYDAY AT BEDTIME 60 tablet 10  . calcium carbonate (OSCAL) 1500 (600 Ca) MG TABS tablet Take 1,500 mg by mouth daily with breakfast.    . carvedilol (COREG) 6.25 MG tablet Take 2 tablets (12.5 mg total) by mouth every morning AND 1 tablet (6.25 mg total) every evening. 12.5 mg in the morning and 6.25mg  in the evening) (2 tablets in the Morning and 1 Tablet in the Evening). 90 tablet 3  . ferrous sulfate 325 (65 FE) MG tablet Take 1 tablet (325 mg total) by mouth 2 (two) times daily with a meal. 30 tablet 3  . hydrALAZINE (APRESOLINE) 25 MG tablet Take 25 mg by mouth 2 (two) times daily.    . Multiple Vitamin (MULTIVITAMIN WITH MINERALS) TABS tablet Take 1 tablet by mouth daily.    . tamsulosin (FLOMAX) 0.4 MG CAPS capsule Take 0.8 mg by mouth daily.    . vitamin B-12 (CYANOCOBALAMIN) 1000 MCG tablet Take 1 tablet (1,000 mcg total) by mouth daily. 7 tablet 0  . warfarin (COUMADIN) 3 MG tablet TAKE 1 TO 1 & 1/2 TABLETS BY MOUTH DAILY AS DIRECTED BY COUMADIN CLINIC 120 tablet 0  . furosemide (LASIX) 20 MG tablet Take 2 tablets (40 mg total) by mouth 2 (two) times daily. 90 tablet 3   No current facility-administered medications for this visit.    LABS/IMAGING: No results found for this or any previous visit (from the past 48 hour(s)). No results found.  WEIGHTS: Wt Readings from Last 3 Encounters:  11/11/19 132 lb 12.8 oz (60.2 kg)  10/29/19 133 lb (60.3 kg)  10/13/19 135 lb 12.8 oz (61.6 kg)    VITALS: BP (!) 171/96 Comment: left arm (dinamap)  Pulse 73   Ht 5\' 6"  (1.676 m)   Wt 132 lb 12.8 oz (60.2 kg)   SpO2 99%   BMI  21.43 kg/m   EXAM: General appearance: alert, cachectic and no distress Neck: JVD - 3 cm above sternal notch, no carotid bruit and thyroid not enlarged, symmetric, no tenderness/mass/nodules Lungs: diminished breath sounds bilaterally Heart: irregularly irregular rhythm, S1, S2 normal and systolic murmur: early systolic 2/6, crescendo at 2nd right intercostal space Abdomen: soft, non-tender; bowel sounds normal; no masses,  no organomegaly Extremities: edema 1-2+ edema Pulses: 2+ and symmetric Skin: Skin color, texture, turgor normal. No rashes or lesions Neurologic: Grossly normal Psych: Pleasant  EKG: A.  fib with controlled ventricular response at 73, minimal voltage criteria for LVH, QTC 513 ms (previously 527 ms prior to starting amiodarone)-personally reviewed  ASSESSMENT: 1. 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 Symptomatic anemia/weakness 3. Supratherapeutic INR 4. Symptomatic bradycardia 5. Acute systolic congestive heart failure-EF reduced to 45%, up to 55-60% (2017) 6. Recent multi-infarct territory stroke-concerning for cardioembolic phenomenon  7. Mild to moderate nonobstructive coronary artery disease by cath 8. Normally functioning mechanical mitral valve 9. Warfarin coagulopathy 10. Acute on chronic kidney disease stage III 11. Medication noncompliance 12. Hypertension  PLAN: 1.   Mr. Lezcano has new systolic heart failure probably related to atrial fibrillation which is now rate controlled.  He has signs and symptoms of decompensated heart failure today and I have advised him to increase his Lasix to 40 mg twice daily.  INR was significantly elevated today at 6.6-  We have advised him as to how to adjust warfarin. We will repeat an INR and continue weekly INRs for the next few weeks with a plan for elective cardioversion provided he remains compensated on September 24.  At that  point he should be adequately loaded on amiodarone giving [place] the best possible chance for successful conversion.  We have discussed the procedure in detail clear the risk, benefits and alternatives and he is agreeable to proceed.  They wish for me to contact his brother who is a physician - I was able to reach him and we spoke this evening regarding my recommendations for his treatment.  Pixie Casino, MD, [facility], [place] Director of the [facility]id [facility]  Cardiovascular Risk Reduction [facility] Attending Cardiologist  Direct Dial: [phone]  Fax: [phone]  Website:  [email or web address] 11/11/2019, 7:15 PM

## 2019-11-11 NOTE — Patient Instructions (Addendum)
Medication Instructions:  Lasix Increased to 40mg  (2tablets) twice daily  *If you need a refill on your cardiac medications before your next appointment, please call your pharmacy*   Lab Work: BMET CBC Please have these done 3-7 days prior to Cardioversion 9/24 If you have labs (blood work) drawn today and your tests are completely normal, you will receive your results only by:  [place] (if you have MyChart) OR  A paper copy in the mail If you have any lab test that is abnormal or we need to change your treatment, we will call you to review the results.   Testing/Procedures: Dear Mr. Olive Motyka are scheduled for a Cardioversion on 9/24 with Dr.Hilty.  Please arrive at the [facility] (Main Entrance A) at [facility]: [address] [place], [place] [address] at 9 am. (1 hour prior to procedure unless lab work is needed; if lab work is needed arrive 1.5 hours ahead)  DIET: Nothing to eat or drink after midnight except a sip of water with medications (see medication instructions below)  Medication Instructions:  Continue your anticoagulant: Warfarin (Coumadin) You will need to continue your anticoagulant after your procedure until you  are told by your  Provider that it is safe to stop  Come to:  [facility] Here at [place] office, [place] must have a responsible person to drive you home and stay in the waiting area during your procedure. Failure to do so could result in cancellation.  Bring your insurance cards.  *Special Note: Every effort is made to have your procedure done on time. Occasionally there are emergencies that occur at the hospital that may cause delays. Please be patient if a delay does occur.   Follow-Up: At [facility], you and your health needs are our priority.  As part of our continuing mission to provide you with exceptional heart care, we have created designated Provider Care Teams.  These Care Teams include your primary  Cardiologist (physician) and Advanced Practice Providers (APPs -  Physician Assistants and Nurse Practitioners) who all work together to provide you with the care you need, when you need it.  Your next appointment:   2  week(s)  After cardioversion 9/24  The format for your next appointment:   In Person  Provider:   K. Mali Hilty, MD   Covid Tested needed 9/21 at 3:05pm at [place]. [place].

## 2019-11-12 ENCOUNTER — Telehealth: Payer: Self-pay | Admitting: Internal Medicine

## 2019-11-12 NOTE — Telephone Encounter (Signed)
Patient aware of follow up appointment (f/u cardioversion) scheduled for Wednesday 12/24/19 at 8:30 am with Dr. Debara Pickett.  Spoke with patient's wife to schedulel

## 2019-11-14 ENCOUNTER — Other Ambulatory Visit: Payer: Self-pay

## 2019-11-14 ENCOUNTER — Ambulatory Visit (INDEPENDENT_AMBULATORY_CARE_PROVIDER_SITE_OTHER): Payer: BC Managed Care – PPO

## 2019-11-14 ENCOUNTER — Ambulatory Visit: Payer: BC Managed Care – PPO | Admitting: Internal Medicine

## 2019-11-14 DIAGNOSIS — Z7901 Long term (current) use of anticoagulants: Secondary | ICD-10-CM

## 2019-11-14 DIAGNOSIS — Z952 Presence of prosthetic heart valve: Secondary | ICD-10-CM

## 2019-11-14 LAB — POCT INR: INR: 5.2 — AB (ref 2.0–3.0)

## 2019-11-14 NOTE — Patient Instructions (Signed)
Take 1 tablet daily except 0.5 tablet on Friday, Tuesday and Thursday.  Repeat INR next Friday.

## 2019-11-21 ENCOUNTER — Other Ambulatory Visit: Payer: Self-pay

## 2019-11-21 ENCOUNTER — Ambulatory Visit (INDEPENDENT_AMBULATORY_CARE_PROVIDER_SITE_OTHER): Payer: BC Managed Care – PPO

## 2019-11-21 DIAGNOSIS — Z952 Presence of prosthetic heart valve: Secondary | ICD-10-CM | POA: Diagnosis not present

## 2019-11-21 DIAGNOSIS — Z7901 Long term (current) use of anticoagulants: Secondary | ICD-10-CM

## 2019-11-21 LAB — POCT INR: INR: 7 — AB (ref 2.0–3.0)

## 2019-11-21 NOTE — Patient Instructions (Signed)
Hold today, tomorrow and Sunday and then continue 1 tablet daily except 0.5 tablet on Friday, Tuesday and Thursday.  Repeat INR next Monday.

## 2019-11-24 ENCOUNTER — Ambulatory Visit (INDEPENDENT_AMBULATORY_CARE_PROVIDER_SITE_OTHER): Payer: BC Managed Care – PPO

## 2019-11-24 ENCOUNTER — Other Ambulatory Visit: Payer: Self-pay

## 2019-11-24 DIAGNOSIS — Z7901 Long term (current) use of anticoagulants: Secondary | ICD-10-CM

## 2019-11-24 DIAGNOSIS — Z952 Presence of prosthetic heart valve: Secondary | ICD-10-CM | POA: Diagnosis not present

## 2019-11-24 LAB — POCT INR: INR: 3.2 — AB (ref 2.0–3.0)

## 2019-11-24 NOTE — Patient Instructions (Signed)
continue 1 tablet daily except 1.5 tablet on Friday.  Repeat INR Friday.

## 2019-11-26 ENCOUNTER — Other Ambulatory Visit: Payer: Self-pay | Admitting: Internal Medicine

## 2019-11-26 DIAGNOSIS — I4819 Other persistent atrial fibrillation: Secondary | ICD-10-CM

## 2019-11-28 ENCOUNTER — Other Ambulatory Visit: Payer: Self-pay

## 2019-11-28 ENCOUNTER — Ambulatory Visit (INDEPENDENT_AMBULATORY_CARE_PROVIDER_SITE_OTHER): Payer: BC Managed Care – PPO

## 2019-11-28 DIAGNOSIS — Z952 Presence of prosthetic heart valve: Secondary | ICD-10-CM | POA: Diagnosis not present

## 2019-11-28 DIAGNOSIS — Z7901 Long term (current) use of anticoagulants: Secondary | ICD-10-CM

## 2019-11-28 LAB — POCT INR: INR: 3.4 — AB (ref 2.0–3.0)

## 2019-11-28 NOTE — Patient Instructions (Addendum)
continue 1 tablet daily except 1.5 tablet on Friday.  Repeat INR 2 weeks. Come to office for Labs 9/21 (Tuesday) at 4.

## 2019-12-01 ENCOUNTER — Other Ambulatory Visit: Payer: Self-pay

## 2019-12-01 DIAGNOSIS — Z7901 Long term (current) use of anticoagulants: Secondary | ICD-10-CM

## 2019-12-02 ENCOUNTER — Other Ambulatory Visit: Payer: Self-pay | Admitting: Internal Medicine

## 2019-12-02 ENCOUNTER — Other Ambulatory Visit (HOSPITAL_COMMUNITY)
Admission: RE | Admit: 2019-12-02 | Discharge: 2019-12-02 | Disposition: A | Discharge status: Home / Self Care | Payer: BC Managed Care – PPO | Source: Ambulatory Visit | Attending: Internal Medicine | Admitting: Internal Medicine

## 2019-12-02 ENCOUNTER — Other Ambulatory Visit (HOSPITAL_COMMUNITY): Payer: BC Managed Care – PPO

## 2019-12-02 DIAGNOSIS — Z20822 Contact with and (suspected) exposure to covid-19: Secondary | ICD-10-CM | POA: Insufficient documentation

## 2019-12-02 DIAGNOSIS — Z01812 Encounter for preprocedural laboratory examination: Secondary | ICD-10-CM | POA: Diagnosis not present

## 2019-12-02 LAB — BASIC METABOLIC PANEL
BUN/Creatinine Ratio: 17 (ref 10–24)
BUN: 40 mg/dL — ABNORMAL HIGH (ref 8–27)
CO2: 28 mmol/L (ref 20–29)
Calcium: 8.4 mg/dL — ABNORMAL LOW (ref 8.6–10.2)
Chloride: 100 mmol/L (ref 96–106)
Creatinine, Ser: 2.41 mg/dL — ABNORMAL HIGH (ref 0.76–1.27)
GFR calc Af Amer: 28 mL/min/{1.73_m2} — ABNORMAL LOW (ref >59)
GFR calc non Af Amer: 24 mL/min/{1.73_m2} — ABNORMAL LOW (ref >59)
Glucose: 177 mg/dL — ABNORMAL HIGH (ref 65–99)
Potassium: 4.8 mmol/L (ref 3.5–5.2)
Sodium: 140 mmol/L (ref 134–144)

## 2019-12-02 LAB — SARS CORONAVIRUS 2 (TAT 6-24 HRS): SARS Coronavirus 2: NEGATIVE

## 2019-12-03 ENCOUNTER — Other Ambulatory Visit: Payer: Self-pay | Admitting: Internal Medicine

## 2019-12-03 NOTE — Telephone Encounter (Signed)
This encounter was created in error - please disregard.

## 2019-12-04 ENCOUNTER — Ambulatory Visit (INDEPENDENT_AMBULATORY_CARE_PROVIDER_SITE_OTHER): Payer: BC Managed Care – PPO | Admitting: Pharmacist

## 2019-12-04 ENCOUNTER — Other Ambulatory Visit: Payer: Self-pay

## 2019-12-04 DIAGNOSIS — Z7901 Long term (current) use of anticoagulants: Secondary | ICD-10-CM | POA: Diagnosis not present

## 2019-12-04 DIAGNOSIS — Z952 Presence of prosthetic heart valve: Secondary | ICD-10-CM

## 2019-12-04 LAB — POCT INR: INR: 8 — AB (ref 2.0–3.0)

## 2019-12-05 ENCOUNTER — Telehealth: Payer: Self-pay | Admitting: Physician Assistant

## 2019-12-05 ENCOUNTER — Ambulatory Visit (HOSPITAL_COMMUNITY)
Admission: RE | Admit: 2019-12-05 | Payer: BC Managed Care – PPO | Source: Home / Self Care | Admitting: Internal Medicine

## 2019-12-05 ENCOUNTER — Encounter (HOSPITAL_COMMUNITY): Admission: RE | Payer: BC Managed Care – PPO | Source: Home / Self Care

## 2019-12-05 LAB — PROTIME-INR
INR: 5.8 (ref 0.9–1.2)
Prothrombin Time: 57.3 s — ABNORMAL HIGH (ref 9.1–12.0)

## 2019-12-05 SURGERY — CARDIOVERSION
Anesthesia: General

## 2019-12-05 NOTE — Telephone Encounter (Signed)
Cardioversion was cancelled and patient received inductions with follow up on Monday.  Please see anticoagulation note.

## 2019-12-05 NOTE — Telephone Encounter (Signed)
   Received call to after hours answering line this morning. Lab called to notify us of INR 5.8. Per chart review, patient seen in Coumadin clinic yesterday with INR of 8.0 without any signs of bleeding so this was simply a f/u lab confirmatory value. Patient appears to actually be scheduled for cardioversion today so will route both to the Coumadin clinic as well as Dr. Debara Pickett for further review/action. Bertina Guthridge PA-C

## 2019-12-08 ENCOUNTER — Ambulatory Visit (INDEPENDENT_AMBULATORY_CARE_PROVIDER_SITE_OTHER): Payer: BC Managed Care – PPO | Admitting: Pharmacist

## 2019-12-08 ENCOUNTER — Other Ambulatory Visit: Payer: Self-pay

## 2019-12-08 DIAGNOSIS — Z7901 Long term (current) use of anticoagulants: Secondary | ICD-10-CM | POA: Diagnosis not present

## 2019-12-08 DIAGNOSIS — Z952 Presence of prosthetic heart valve: Secondary | ICD-10-CM

## 2019-12-08 LAB — POCT INR: INR: 4.3 — AB (ref 2.0–3.0)

## 2019-12-08 NOTE — Patient Instructions (Addendum)
Decrease warfarin dose to 3mg  daily except for 1.5mg  (1/2 tablet) every Monday and Friday.  Repeat INR 1 week.  * Resume furosemide 40mg  every day* INR, BMET and CBC in 1 week as venous draw.

## 2019-12-12 ENCOUNTER — Telehealth: Payer: Self-pay | Admitting: Internal Medicine

## 2019-12-12 NOTE — Telephone Encounter (Signed)
Pt Wife informed of providers result & recommendations. Pt verbalized understanding. No further questions . She will make sure to get lab and have f/u on the 13th

## 2019-12-12 NOTE — Telephone Encounter (Signed)
Pt c/o swelling: STAT is pt has developed SOB within 24 hours  1) How much weight have you gained and in what time span? N/A  2) If swelling, where is the swelling located? Both legs, feet, and ankles; face  3) Are you currently taking a fluid pill? Yes  4) Are you currently SOB? No  5) Do you have a log of your daily weights (if so, list)? Yes  6) Have you gained 3 pounds in a day or 5 pounds in a week? N/A  7) Have you traveled recently? No

## 2019-12-12 NOTE — Telephone Encounter (Signed)
Returned call s/w PRAKNA Frew (WIFE) pt's weight is up today is 130#.she states that he usually weighs between 128-130# but today his legs and ankles are very swollen. He denies any SOB, CP or any other sx. Pt takes lasix 40mg  in the am. When he was here it was decreased 2 weeks ago decreased to 40mg  in the am only. His last creat was high at 2.41 on 12-02-19.  Ok to increase lasix back to BID? Please advise

## 2019-12-12 NOTE — Telephone Encounter (Signed)
Ok to increase to 40 mg qam and 20 mg in the afternoon. Needs to get scheduled follow-up labs to recheck creatinine as advised.  Dr Lemmie Evens

## 2019-12-15 ENCOUNTER — Telehealth: Payer: Self-pay | Admitting: Physician Assistant

## 2019-12-15 LAB — CBC
Hematocrit: 31 % — ABNORMAL LOW (ref 37.5–51.0)
Hemoglobin: 10.1 g/dL — ABNORMAL LOW (ref 13.0–17.7)
MCH: 30.7 pg (ref 26.6–33.0)
MCHC: 32.6 g/dL (ref 31.5–35.7)
MCV: 94 fL (ref 79–97)
Platelets: 166 10*3/uL (ref 150–450)
RBC: 3.29 x10E6/uL — ABNORMAL LOW (ref 4.14–5.80)
RDW: 13.4 % (ref 11.6–15.4)
WBC: 6.3 10*3/uL (ref 3.4–10.8)

## 2019-12-15 LAB — COMPREHENSIVE METABOLIC PANEL
ALT: 36 IU/L (ref 0–44)
AST: 31 IU/L (ref 0–40)
Albumin/Globulin Ratio: 1.3 (ref 1.2–2.2)
Albumin: 3.6 g/dL — ABNORMAL LOW (ref 3.7–4.7)
Alkaline Phosphatase: 86 IU/L (ref 44–121)
BUN/Creatinine Ratio: 11 (ref 10–24)
BUN: 27 mg/dL (ref 8–27)
Bilirubin Total: 0.8 mg/dL (ref 0.0–1.2)
CO2: 25 mmol/L (ref 20–29)
Calcium: 8.4 mg/dL — ABNORMAL LOW (ref 8.6–10.2)
Chloride: 105 mmol/L (ref 96–106)
Creatinine, Ser: 2.35 mg/dL — ABNORMAL HIGH (ref 0.76–1.27)
GFR calc Af Amer: 29 mL/min/{1.73_m2} — ABNORMAL LOW (ref >59)
GFR calc non Af Amer: 25 mL/min/{1.73_m2} — ABNORMAL LOW (ref >59)
Globulin, Total: 2.7 g/dL (ref 1.5–4.5)
Glucose: 119 mg/dL — ABNORMAL HIGH (ref 65–99)
Potassium: 4.5 mmol/L (ref 3.5–5.2)
Sodium: 142 mmol/L (ref 134–144)
Total Protein: 6.3 g/dL (ref 6.0–8.5)

## 2019-12-15 LAB — POCT INR: INR: 6.2 — AB (ref 2.0–3.0)

## 2019-12-15 LAB — PROTIME-INR
INR: 6.2 (ref 0.9–1.2)
Prothrombin Time: 60.5 s — ABNORMAL HIGH (ref 9.1–12.0)

## 2019-12-15 NOTE — Telephone Encounter (Signed)
Paged by [facility] for INR of 6.2. patient does not have any bleeding issue. Advised to hold warfarin tonight and our pharmacy team will call tomorrow with further direction.

## 2019-12-16 ENCOUNTER — Other Ambulatory Visit: Payer: Self-pay | Admitting: *Deleted

## 2019-12-16 ENCOUNTER — Ambulatory Visit (INDEPENDENT_AMBULATORY_CARE_PROVIDER_SITE_OTHER): Payer: BC Managed Care – PPO | Admitting: Cardiovascular Disease

## 2019-12-16 DIAGNOSIS — Z7901 Long term (current) use of anticoagulants: Secondary | ICD-10-CM

## 2019-12-16 DIAGNOSIS — Z952 Presence of prosthetic heart valve: Secondary | ICD-10-CM | POA: Diagnosis not present

## 2019-12-16 MED ORDER — FUROSEMIDE 20 MG PO TABS: ORAL_TABLET | ORAL | 3 refills | Status: AC

## 2019-12-16 NOTE — Telephone Encounter (Signed)
Created a anticoagulation encounter for dosing

## 2019-12-19 ENCOUNTER — Other Ambulatory Visit: Payer: Self-pay

## 2019-12-19 ENCOUNTER — Telehealth: Payer: Self-pay | Admitting: Internal Medicine

## 2019-12-19 ENCOUNTER — Emergency Department (HOSPITAL_BASED_OUTPATIENT_CLINIC_OR_DEPARTMENT_OTHER)
Admission: EM | Admit: 2019-12-19 | Discharge: 2019-12-19 | Disposition: A | Discharge status: Home / Self Care | Payer: BC Managed Care – PPO | Attending: Emergency Medicine | Admitting: Emergency Medicine

## 2019-12-19 ENCOUNTER — Emergency Department (HOSPITAL_BASED_OUTPATIENT_CLINIC_OR_DEPARTMENT_OTHER): Payer: BC Managed Care – PPO

## 2019-12-19 ENCOUNTER — Encounter (HOSPITAL_BASED_OUTPATIENT_CLINIC_OR_DEPARTMENT_OTHER): Payer: Self-pay | Admitting: *Deleted

## 2019-12-19 DIAGNOSIS — S7001XA Contusion of right hip, initial encounter: Secondary | ICD-10-CM | POA: Insufficient documentation

## 2019-12-19 DIAGNOSIS — S79911A Unspecified injury of right hip, initial encounter: Secondary | ICD-10-CM | POA: Diagnosis present

## 2019-12-19 DIAGNOSIS — N183 Chronic kidney disease, stage 3 unspecified: Secondary | ICD-10-CM | POA: Diagnosis not present

## 2019-12-19 DIAGNOSIS — I5022 Chronic systolic (congestive) heart failure: Secondary | ICD-10-CM | POA: Diagnosis not present

## 2019-12-19 DIAGNOSIS — R791 Abnormal coagulation profile: Secondary | ICD-10-CM | POA: Insufficient documentation

## 2019-12-19 DIAGNOSIS — Y9389 Activity, other specified: Secondary | ICD-10-CM | POA: Insufficient documentation

## 2019-12-19 DIAGNOSIS — Y929 Unspecified place or not applicable: Secondary | ICD-10-CM | POA: Diagnosis not present

## 2019-12-19 DIAGNOSIS — Y999 Unspecified external cause status: Secondary | ICD-10-CM | POA: Insufficient documentation

## 2019-12-19 DIAGNOSIS — Z7982 Long term (current) use of aspirin: Secondary | ICD-10-CM | POA: Diagnosis not present

## 2019-12-19 DIAGNOSIS — R102 Pelvic and perineal pain: Secondary | ICD-10-CM | POA: Diagnosis not present

## 2019-12-19 DIAGNOSIS — W08XXXA Fall from other furniture, initial encounter: Secondary | ICD-10-CM | POA: Insufficient documentation

## 2019-12-19 DIAGNOSIS — I130 Hypertensive heart and chronic kidney disease with heart failure and stage 1 through stage 4 chronic kidney disease, or unspecified chronic kidney disease: Secondary | ICD-10-CM | POA: Diagnosis not present

## 2019-12-19 DIAGNOSIS — T148XXA Other injury of unspecified body region, initial encounter: Secondary | ICD-10-CM

## 2019-12-19 DIAGNOSIS — I2510 Atherosclerotic heart disease of native coronary artery without angina pectoris: Secondary | ICD-10-CM | POA: Diagnosis not present

## 2019-12-19 DIAGNOSIS — Z79899 Other long term (current) drug therapy: Secondary | ICD-10-CM | POA: Diagnosis not present

## 2019-12-19 DIAGNOSIS — Z7901 Long term (current) use of anticoagulants: Secondary | ICD-10-CM | POA: Insufficient documentation

## 2019-12-19 DIAGNOSIS — E1122 Type 2 diabetes mellitus with diabetic chronic kidney disease: Secondary | ICD-10-CM | POA: Diagnosis not present

## 2019-12-19 LAB — COMPREHENSIVE METABOLIC PANEL
ALT: 39 U/L (ref 0–44)
AST: 43 U/L — ABNORMAL HIGH (ref 15–41)
Albumin: 3.6 g/dL (ref 3.5–5.0)
Alkaline Phosphatase: 62 U/L (ref 38–126)
Anion gap: 10 (ref 5–15)
BUN: 32 mg/dL — ABNORMAL HIGH (ref 8–23)
CO2: 29 mmol/L (ref 22–32)
Calcium: 8.2 mg/dL — ABNORMAL LOW (ref 8.9–10.3)
Chloride: 102 mmol/L (ref 98–111)
Creatinine, Ser: 2.36 mg/dL — ABNORMAL HIGH (ref 0.61–1.24)
GFR, Estimated: 25 mL/min — ABNORMAL LOW (ref >60)
Glucose, Bld: 115 mg/dL — ABNORMAL HIGH (ref 70–99)
Potassium: 4.9 mmol/L (ref 3.5–5.1)
Sodium: 141 mmol/L (ref 135–145)
Total Bilirubin: 1.4 mg/dL — ABNORMAL HIGH (ref 0.3–1.2)
Total Protein: 6.6 g/dL (ref 6.5–8.1)

## 2019-12-19 LAB — CBC WITH DIFFERENTIAL/PLATELET
Abs Immature Granulocytes: 0.04 10*3/uL (ref 0.00–0.07)
Basophils Absolute: 0 10*3/uL (ref 0.0–0.1)
Basophils Relative: 0 %
Eosinophils Absolute: 0.2 10*3/uL (ref 0.0–0.5)
Eosinophils Relative: 3 %
HCT: 31.8 % — ABNORMAL LOW (ref 39.0–52.0)
Hemoglobin: 10 g/dL — ABNORMAL LOW (ref 13.0–17.0)
Immature Granulocytes: 1 %
Lymphocytes Relative: 7 %
Lymphs Abs: 0.5 10*3/uL — ABNORMAL LOW (ref 0.7–4.0)
MCH: 30.6 pg (ref 26.0–34.0)
MCHC: 31.4 g/dL (ref 30.0–36.0)
MCV: 97.2 fL (ref 80.0–100.0)
Monocytes Absolute: 0.5 10*3/uL (ref 0.1–1.0)
Monocytes Relative: 7 %
Neutro Abs: 5.9 10*3/uL (ref 1.7–7.7)
Neutrophils Relative %: 82 %
Platelets: 177 10*3/uL (ref 150–400)
RBC: 3.27 MIL/uL — ABNORMAL LOW (ref 4.22–5.81)
RDW: 15.8 % — ABNORMAL HIGH (ref 11.5–15.5)
WBC: 7.1 10*3/uL (ref 4.0–10.5)
nRBC: 0 % (ref 0.0–0.2)

## 2019-12-19 LAB — URINALYSIS, ROUTINE W REFLEX MICROSCOPIC
Bilirubin Urine: NEGATIVE
Glucose, UA: NEGATIVE mg/dL
Hgb urine dipstick: NEGATIVE
Ketones, ur: NEGATIVE mg/dL
Leukocytes,Ua: NEGATIVE
Nitrite: NEGATIVE
Protein, ur: NEGATIVE mg/dL
Specific Gravity, Urine: 1.015 (ref 1.005–1.030)
pH: 7 (ref 5.0–8.0)

## 2019-12-19 LAB — PROTIME-INR
INR: 5.9 (ref 0.8–1.2)
Prothrombin Time: 51.5 seconds — ABNORMAL HIGH (ref 11.4–15.2)

## 2019-12-19 MED ORDER — FENTANYL CITRATE (PF) 100 MCG/2ML IJ SOLN
25.0000 ug | Freq: Once | INTRAMUSCULAR | Status: AC
  Administered 2019-12-19: 25 ug via INTRAVENOUS
  Filled 2019-12-19: qty 2

## 2019-12-19 MED ORDER — LIDOCAINE 5 % EX PTCH: 1.0000 | MEDICATED_PATCH | CUTANEOUS | 0 refills | Status: AC

## 2019-12-19 MED ORDER — LIDOCAINE 5 % EX PTCH
1.0000 | MEDICATED_PATCH | CUTANEOUS | Status: DC
  Administered 2019-12-19: 1 via TRANSDERMAL
  Filled 2019-12-19: qty 1

## 2019-12-19 MED ORDER — FLEET ENEMA 7-19 GM/118ML RE ENEM
1.0000 | ENEMA | Freq: Once | RECTAL | Status: AC
  Administered 2019-12-19: 1 via RECTAL
  Filled 2019-12-19: qty 1

## 2019-12-19 NOTE — Telephone Encounter (Signed)
Spoke with pt's wife and pt stumbled on steps 3 days ago  and as a result the following days notes some bruising and this has been growing in size right hip area and pain noted as well Per wife last week pt's INR was 6.1 Encouraged pt go to ED or urgent care and get repeat INR and possible Xray Pt's wife is going to take pt to the [facility] facility on [address] in [facility] Will forward to Dr Debara Pickett for review and recommendations ./cy

## 2019-12-19 NOTE — ED Triage Notes (Signed)
C/o  Fall x 3 days ago with sacral pain. Also c/o constipation , denies head injury , pt is on coumadin

## 2019-12-19 NOTE — ED Provider Notes (Signed)
[facility] EMERGENCY DEPARTMENT Provider Note   CSN: [id number] Arrival date & time: 12/19/19  1621     History Chief Complaint  Patient presents with  . Fall    QUE MENEELY is a 79 y.o. male.  The history is provided by the patient and medical records. No language interpreter was used.  Fall   JONH MCQUEARY is a 79 y.o. male who presents to the Emergency Department complaining of fall. He presents the emergency department for evaluation of injuries following a fall that occurred three days ago. He takes Coumadin for history of mechanical valve replacement. On Monday his Coumadin level was checked and found to be elevated at six. His dosing was adjusted but his INR has not been rechecked. Three days ago he was getting off the couch and when he turned he lost his balance and fell, striking his right hip. He experienced mild pain and bruising at that time. Yesterday the pain significantly worsened and the bruising extended down his leg. Now he is having difficulty with standing and walking secondary to pain in the hip. He denies any fevers, chest pain, shortness of breath, abdominal pain, nausea, vomiting, diarrhea, dysuria. He does have constipation, last BM was three days ago. He has intermittent lower extremity edema and does have mild edema today.    Past Medical History:  Diagnosis Date  . BPH (benign prostatic hyperplasia)   . Chronic anticoagulation    coumadin for mechanical valves  . CKD (chronic kidney disease), stage III ([place])   . Diabetes mellitus type 2, diet-controlled ([place])   . Gastric cancer ([place]) 2005   s/p gastrectomy  . High cholesterol   . HOH (hard of hearing)   . Hypertension   . Rheumatic heart disease   . S/P MVR (mitral valve replacement) 2015   40mm StJude mechanical valve  . Stroke [facility])     Patient Active Problem List   Diagnosis Date Noted  . Stroke ([place])   . Essential hypertension 02/06/2017  . Cerebrovascular accident (CVA)  due to embolism of left middle cerebral artery ([place]) 12/01/2016  . Acute ischemic stroke ([place]) 12/01/2016  . Acute systolic congestive heart failure, NYHA class 3 ([place]) 04/18/2016  . DOE (dyspnea on exertion) 04/07/2016  . Abnormal EKG 10/18/2015  . AKI (acute kidney injury) ([place])   . BPH (benign prostatic hyperplasia) 08/29/2015  . Elevated troponin 08/29/2015  . SBO (small bowel obstruction) ([place]) 08/29/2015  . Pancreatitis 08/29/2015  . Protein-calorie malnutrition, severe ([place]) 08/29/2015  . Hyperlipidemia 08/29/2015  . Noncompliance with medications 05/26/2015  . Long term (current) use of anticoagulants 05/18/2015  . Coronary artery disease involving native coronary artery of native heart without angina pectoris   . Chronic systolic (congestive) heart failure ([place]) 05/06/2015  . Hypertensive urgency 05/06/2015  . CKD (chronic kidney disease) stage 3, GFR 30-59 ml/min (H[facility]) 05/06/2015  . Chronic anemia 05/06/2015  . H/O mitral valve replacement with mechanical valve 05/06/2015  . CHF (congestive heart failure) ([place]) 05/06/2015    Past Surgical History:  Procedure Laterality Date  . CARDIAC CATHETERIZATION N/A 05/10/2015   Procedure: Right/Left Heart Cath and Coronary Angiography;  Surgeon: Burnell Blanks, MD;  Location: [facility];  Service: Cardiovascular;  Laterality: N/A;  . COLONOSCOPY  11/22/2017   [facility]    . IR ANGIO INTRA EXTRACRAN SEL COM CAROTID INNOMINATE BILAT MOD SED  12/01/2016  . IR ANGIO VERTEBRAL SEL SUBCLAVIAN INNOMINATE UNI R  MOD SED  12/01/2016  . MITRAL VALVE REPLACEMENT  10/24/2013   60mm StJude mechanical valve  . PARTIAL GASTRECTOMY  2005  . SMALL BOWEL ENTEROSCOPY  11/22/2017   [facility]        Family History  Problem Relation Age of Onset  . Hypertension Mother   . Diabetes Mother   . Cancer Brother   . Colon cancer Neg Hx   . Esophageal cancer Neg Hx     Social History   Tobacco Use  . Smoking  status: Never Smoker  . Smokeless tobacco: Never Used  Substance Use Topics  . Alcohol use: No    Alcohol/week: 0.0 standard drinks  . Drug use: No    Home Medications Prior to Admission medications   Medication Sig Start Date End Date Taking? Authorizing Provider  amiodarone (PACERONE) 200 MG tablet Take 1 tablet (200 mg total) by mouth 2 (two) times daily for 14 days, THEN 1 tablet (200 mg total) daily. Patient taking differently: Take 200 mg by mouth daily. 10/30/19 11/12/20  Pixie Casino, MD  aspirin EC 81 MG tablet Take 81 mg by mouth daily.    [provider]  atorvastatin (LIPITOR) 20 MG tablet TAKE 1 TABLET BY MOUTH EVERYDAY AT BEDTIME Patient taking differently: Take 20 mg by mouth at bedtime.  12/02/18   Hilty, Nadean Corwin, MD  calcium carbonate (OSCAL) 1500 (600 Ca) MG TABS tablet Take 1,500 mg by mouth daily with breakfast.    [provider]  carvedilol (COREG) 6.25 MG tablet Take 2 tablets (12.5 mg total) by mouth every morning AND 1 tablet (6.25 mg total) every evening. 12.5 mg in the morning and 6.25mg  in the evening) (2 tablets in the Morning and 1 Tablet in the Evening). 10/21/19   Deberah Pelton, NP  ferrous sulfate 325 (65 FE) MG tablet Take 1 tablet (325 mg total) by mouth 2 (two) times daily with a meal. 05/13/15   Regalado, Belkys A, MD  furosemide (LASIX) 20 MG tablet Take 2 tablets (40mg ) by mouth in the morning and 1 tablet (20mg ) in the afternoon 12/16/19   Hilty, Nadean Corwin, MD  hydrALAZINE (APRESOLINE) 25 MG tablet Take 25 mg by mouth 2 (two) times daily. 09/20/19   [provider]  lidocaine (LIDODERM) 5 % Place 1 patch onto the skin daily. Remove & Discard patch within 12 hours or as directed by MD 12/19/19   Quintella Reichert, MD  Multiple Vitamin (MULTIVITAMIN WITH MINERALS) TABS tablet Take 1 tablet by mouth daily.    [provider]  tamsulosin (FLOMAX) 0.4 MG CAPS capsule Take 0.8 mg by mouth daily.    [provider]   vitamin B-12 (CYANOCOBALAMIN) 1000 MCG tablet Take 1 tablet (1,000 mcg total) by mouth daily. 05/13/15   Regalado, Belkys A, MD  warfarin (COUMADIN) 3 MG tablet TAKE 1 TO 1 & 1/2 TABLETS BY MOUTH DAILY AS DIRECTED BY COUMADIN CLINIC Patient taking differently: Take 3 mg by mouth daily.  10/13/19   Hilty, Nadean Corwin, MD    Allergies    Penicillins and Vancomycin  Review of Systems   Review of Systems  All other systems reviewed and are negative.   Physical Exam Updated Vital Signs BP (!) 147/96 (BP Location: Right Arm)   Pulse 79   Temp 98 F (36.7 C) (Oral)   Resp 16   Ht 5\' 6"  (1.676 m)   Wt 59 kg   SpO2 98%   BMI 20.98 kg/m  Physical Exam Vitals and nursing note reviewed.  Constitutional:      Appearance: He is well-developed.  HENT:     Head: Normocephalic and atraumatic.     Comments: PERRL, right subconjunctival hemorrhage Cardiovascular:     Rate and Rhythm: Normal rate and regular rhythm.     Comments: Mechanical click Pulmonary:     Effort: Pulmonary effort is normal. No respiratory distress.     Breath sounds: Normal breath sounds.  Abdominal:     Palpations: Abdomen is soft.     Tenderness: There is no abdominal tenderness. There is no guarding or rebound.  Musculoskeletal:     Comments: One plus pitting edema to bilateral lower extremities. 2+ femoral and DP pulses bilaterally. There is ecchymosis to the right gluteal region that extends down the posterior right thigh to the back of the right knee. There is local tenderness to palpation over the right gluteal region.  Skin:    General: Skin is warm and dry.  Neurological:     Mental Status: He is alert and oriented to person, place, and time.     Comments: Five out of five strength in all four extremities  Psychiatric:        Behavior: Behavior normal.     ED Results / Procedures / Treatments   Labs (all labs ordered are listed, but only abnormal results are displayed) Labs Reviewed  COMPREHENSIVE  METABOLIC PANEL - Abnormal; Notable for the following components:      Result Value   Glucose, Bld 115 (*)    BUN 32 (*)    Creatinine, Ser 2.36 (*)    Calcium 8.2 (*)    AST 43 (*)    Total Bilirubin 1.4 (*)    GFR, Estimated 25 (*)    All other components within normal limits  CBC WITH DIFFERENTIAL/PLATELET - Abnormal; Notable for the following components:   RBC 3.27 (*)    Hemoglobin 10.0 (*)    HCT 31.8 (*)    RDW 15.8 (*)    Lymphs Abs 0.5 (*)    All other components within normal limits  PROTIME-INR - Abnormal; Notable for the following components:   Prothrombin Time 51.5 (*)    INR 5.9 (*)    All other components within normal limits  URINALYSIS, ROUTINE W REFLEX MICROSCOPIC    EKG None  Radiology CT Abdomen Pelvis Wo Contrast  Result Date: 12/19/2019 CLINICAL DATA:  Progressive right pelvic pain and bruising since falling 3 days ago. Difficulty ambulating and bearing weight. EXAM: CT ABDOMEN AND PELVIS WITHOUT CONTRAST TECHNIQUE: Multidetector CT imaging of the abdomen and pelvis was performed following the standard protocol without IV contrast. COMPARISON:  Prior CT 08/26/2019. FINDINGS: Lower chest: Prominent interstitial markings at both lung bases and a trace right pleural effusion are stable. There is stable cardiomegaly post mitral valve replacement. Aortic and coronary artery atherosclerosis are noted. There is mild distal esophageal wall thickening which is similar to the previous study. Hepatobiliary: No hepatic abnormalities are identified on noncontrast imaging. There is dependent material in the gallbladder lumen which may reflect sludge or small stones. No evidence of gallbladder wall thickening or biliary dilatation. Pancreas: Atrophied without focal abnormality or surrounding inflammation. Spleen: Normal in size without focal abnormality. Adrenals/Urinary Tract: Both adrenal glands appear normal. Stable bilateral renal cortical thinning and small exophytic lesions  bilaterally. No evidence of urinary tract calculus or hydronephrosis. Bladder wall thickening and right-sided diverticulum are again noted. Stomach/Bowel: No evidence of bowel wall  thickening, distention or surrounding inflammatory change. The appendix appears normal. Moderate colonic stool burden and scattered diverticula are noted. Vascular/Lymphatic: There are no enlarged abdominal or pelvic lymph nodes. Aortic and branch vessel atherosclerosis. Reproductive: The prostate gland and seminal vesicles appear normal. Other: A bilobed right periumbilical hernia has mildly enlarged, measuring up to 5.4 cm on image 7/5. There is a small amount of fluid within this hernia, but no herniated bowel. Stable small left inguinal hernia containing fat. Musculoskeletal: Subcutaneous edema anteriorly in the right lower abdominal wall and laterally in the right pelvis without focal hematoma. There is a probable 4.7 cm hematoma inferiorly in the right gluteus maximus muscle adjacent to the ischial tuberosity (image 82/2). No evidence of acute pelvic, proximal femoral or spinal fracture. IMPRESSION: 1. Probable inferior right buttocks hematoma with additional edema in the right lower abdominal wall and lateral right pelvis. No evidence of acute fracture. 2. Mildly enlarged bilobed right periumbilical hernia containing a small amount of fluid, but no herniated bowel. 3. Stable chronic bladder wall thickening and right-sided diverticulum. 4. Aortic Atherosclerosis (ICD10-I70.0). Electronically Signed   By: Richardean Sale M.D.   On: 12/19/2019 18:12    Procedures Procedures (including critical care time)  Medications Ordered in ED Medications  lidocaine (LIDODERM) 5 % 1 patch (1 patch Transdermal Patch Applied 12/19/19 1929)  fentaNYL (SUBLIMAZE) injection 25 mcg (25 mcg Intravenous Given 12/19/19 1813)  sodium phosphate (FLEET) 7-19 GM/118ML enema 1 enema (1 enema Rectal Given 12/19/19 1928)  fentaNYL (SUBLIMAZE) injection 25  mcg (25 mcg Intravenous Given 12/19/19 1949)    ED Course  I have reviewed the triage vital signs and the nursing notes.  Pertinent labs & imaging results that were available during my care of the patient were reviewed by me and considered in my medical decision making (see chart for details).    MDM Rules/Calculators/A&P                         patient here for evaluation of right hip pain following a fall three days ago. He is anticoagulated on Coumadin for history of mechanical valve replacement. He also complains of constipation. On evaluation patient is non-toxic appearing. He does have a large hematoma to the right gluteal region extending down his posterior right leg to the posterior knee. No evidence of cellulitis or deep tissue space infection. INR is elevated at 5.9 today. Labs with stable renal insufficiency and stable anemia. CT scan is negative for acute fracture but does demonstrate findings of hematoma. Discussed with patient home care for contusion. Given his mechanical valve will hold Coumadin for the next two doses with close follow-up with Coumadin clinic for recheck of his INR. Also will treat constipation with enema discussed home care for constipation. Return precautions discussed.  Final Clinical Impression(s) / ED Diagnoses Final diagnoses:  Elevated INR  Hematoma    Rx / DC Orders ED Discharge Orders         Ordered    lidocaine (LIDODERM) 5 %  Every 24 hours        12/19/19 1915           Quintella Reichert, MD 12/19/19 2008

## 2019-12-19 NOTE — ED Notes (Signed)
Due to discomfort and constipation pt is requesting enema before discharge.  Will discharge after he has results from enema.

## 2019-12-19 NOTE — ED Notes (Signed)
Pt was able to have large result from enema.  Helped pt get cleaned up and dressed and taken out in [facility]

## 2019-12-19 NOTE — ED Notes (Signed)
Urinal given, pt advised that urine sample is needed

## 2019-12-19 NOTE — Telephone Encounter (Signed)
Patient's wife states the patient fell 3 days ago and his bruise has been expanding and it hurts. She states the patient is on coumadin.

## 2019-12-19 NOTE — Telephone Encounter (Signed)
Agree with recommendations for urgent care/ER visit - he probably needs a CT  Dr Lemmie Evens

## 2019-12-19 NOTE — Discharge Instructions (Addendum)
Your INR was high today.  Do not take your warfarin today, Friday, or tomorrow, Saturday.  You may take half a tablet of warfarin on Sunday (1.5mg ).  See your doctor to have your INR rechecked on Monday.    You may take colace, available over the counter as needed for constipation.

## 2019-12-19 NOTE — Telephone Encounter (Signed)
Pt is in ED at this time ./cy

## 2019-12-21 ENCOUNTER — Other Ambulatory Visit: Payer: Self-pay

## 2019-12-21 ENCOUNTER — Encounter (HOSPITAL_COMMUNITY): Payer: Self-pay

## 2019-12-21 ENCOUNTER — Emergency Department (HOSPITAL_COMMUNITY): Payer: BC Managed Care – PPO

## 2019-12-21 ENCOUNTER — Emergency Department (HOSPITAL_COMMUNITY)
Admission: EM | Admit: 2019-12-21 | Discharge: 2019-12-21 | Disposition: A | Discharge status: Home / Self Care | Payer: BC Managed Care – PPO | Attending: Emergency Medicine | Admitting: Emergency Medicine

## 2019-12-21 DIAGNOSIS — N183 Chronic kidney disease, stage 3 unspecified: Secondary | ICD-10-CM | POA: Insufficient documentation

## 2019-12-21 DIAGNOSIS — E119 Type 2 diabetes mellitus without complications: Secondary | ICD-10-CM | POA: Diagnosis not present

## 2019-12-21 DIAGNOSIS — W19XXXA Unspecified fall, initial encounter: Secondary | ICD-10-CM | POA: Diagnosis not present

## 2019-12-21 DIAGNOSIS — Z7901 Long term (current) use of anticoagulants: Secondary | ICD-10-CM | POA: Insufficient documentation

## 2019-12-21 DIAGNOSIS — S7001XA Contusion of right hip, initial encounter: Secondary | ICD-10-CM | POA: Diagnosis not present

## 2019-12-21 DIAGNOSIS — I5022 Chronic systolic (congestive) heart failure: Secondary | ICD-10-CM | POA: Insufficient documentation

## 2019-12-21 DIAGNOSIS — Z79899 Other long term (current) drug therapy: Secondary | ICD-10-CM | POA: Diagnosis not present

## 2019-12-21 DIAGNOSIS — M25551 Pain in right hip: Secondary | ICD-10-CM

## 2019-12-21 DIAGNOSIS — I130 Hypertensive heart and chronic kidney disease with heart failure and stage 1 through stage 4 chronic kidney disease, or unspecified chronic kidney disease: Secondary | ICD-10-CM | POA: Diagnosis not present

## 2019-12-21 DIAGNOSIS — I2510 Atherosclerotic heart disease of native coronary artery without angina pectoris: Secondary | ICD-10-CM | POA: Diagnosis not present

## 2019-12-21 DIAGNOSIS — Z7982 Long term (current) use of aspirin: Secondary | ICD-10-CM | POA: Diagnosis not present

## 2019-12-21 DIAGNOSIS — Z85028 Personal history of other malignant neoplasm of stomach: Secondary | ICD-10-CM | POA: Diagnosis not present

## 2019-12-21 DIAGNOSIS — S79911A Unspecified injury of right hip, initial encounter: Secondary | ICD-10-CM | POA: Diagnosis present

## 2019-12-21 MED ORDER — LIDOCAINE 5 % EX PTCH
1.0000 | MEDICATED_PATCH | CUTANEOUS | Status: DC
  Administered 2019-12-21: 1 via TRANSDERMAL
  Filled 2019-12-21 (×2): qty 1

## 2019-12-21 MED ORDER — OXYCODONE HCL 5 MG PO TABS
5.0000 mg | ORAL_TABLET | Freq: Once | ORAL | Status: AC
  Administered 2019-12-21: 5 mg via ORAL
  Filled 2019-12-21: qty 1

## 2019-12-21 MED ORDER — OXYCODONE HCL 5 MG PO TABS
5.0000 mg | ORAL_TABLET | Freq: Four times a day (QID) | ORAL | 0 refills | Status: DC | PRN
Start: 2019-12-21 — End: 2020-01-09

## 2019-12-21 NOTE — ED Triage Notes (Signed)
Patient presented to Ed from home with c/o fall four days ago. Patient was seen at the urgent care yesterday and return home. Came to ed today due left and right hip pain with pain radiating to his feet.

## 2019-12-21 NOTE — ED Provider Notes (Addendum)
[facility] DEPT Provider Note   CSN: [id number] Arrival date & time: 12/21/19  1638     History Chief Complaint  Patient presents with  . Hip Pain    Robert [name] is a 79 y.o. male.  The history is provided by the patient.  Hip Pain This is a new problem. The current episode started more than 2 days ago. The problem occurs daily. The problem has not changed since onset.Pertinent negatives include no chest pain, no abdominal pain, no headaches and no shortness of breath. The symptoms are aggravated by walking. The symptoms are relieved by acetaminophen. He has tried acetaminophen for the symptoms. The treatment provided mild relief.       Past Medical History:  Diagnosis Date  . BPH (benign prostatic hyperplasia)   . Chronic anticoagulation    coumadin for mechanical valves  . CKD (chronic kidney disease), stage III ([place])   . Diabetes mellitus type 2, diet-controlled ([place])   . Gastric cancer ([place]) 2005   s/p gastrectomy  . High cholesterol   . HOH (hard of hearing)   . Hypertension   . Rheumatic heart disease   . S/P MVR (mitral valve replacement) 2015   59mm StJude mechanical valve  . Stroke [facility])     Patient Active Problem List   Diagnosis Date Noted  . Stroke ([place])   . Essential hypertension 02/06/2017  . Cerebrovascular accident (CVA) due to embolism of left middle cerebral artery ([place]) 12/01/2016  . Acute ischemic stroke ([place]) 12/01/2016  . Acute systolic congestive heart failure, NYHA class 3 ([place]) 04/18/2016  . DOE (dyspnea on exertion) 04/07/2016  . Abnormal EKG 10/18/2015  . AKI (acute kidney injury) ([place])   . BPH (benign prostatic hyperplasia) 08/29/2015  . Elevated troponin 08/29/2015  . SBO (small bowel obstruction) ([place]) 08/29/2015  . Pancreatitis 08/29/2015  . Protein-calorie malnutrition, severe ([place]) 08/29/2015  . Hyperlipidemia 08/29/2015  . Noncompliance with medications 05/26/2015  . Long term (current)  use of anticoagulants 05/18/2015  . Coronary artery disease involving native coronary artery of native heart without angina pectoris   . Chronic systolic (congestive) heart failure ([place]) 05/06/2015  . Hypertensive urgency 05/06/2015  . CKD (chronic kidney disease) stage 3, GFR 30-59 ml/min (HCC) 05/06/2015  . Chronic anemia 05/06/2015  . H/O mitral valve replacement with mechanical valve 05/06/2015  . CHF (congestive heart failure) ([place]) 05/06/2015    Past Surgical History:  Procedure Laterality Date  . CARDIAC CATHETERIZATION N/A 05/10/2015   Procedure: Right/Left Heart Cath and Coronary Angiography;  Surgeon: Burnell Blanks, MD;  Location: [facility];  Service: Cardiovascular;  Laterality: N/A;  . COLONOSCOPY  11/22/2017   [place]    . IR ANGIO INTRA EXTRACRAN SEL COM CAROTID INNOMINATE BILAT MOD SED  12/01/2016  . IR ANGIO VERTEBRAL SEL SUBCLAVIAN INNOMINATE UNI R MOD SED  12/01/2016  . MITRAL VALVE REPLACEMENT  10/24/2013   73mm StJude mechanical valve  . PARTIAL GASTRECTOMY  2005  . SMALL BOWEL ENTEROSCOPY  11/22/2017   [facility]        Family History  Problem Relation Age of Onset  . Hypertension Mother   . Diabetes Mother   . Cancer Brother   . Colon cancer Neg Hx   . Esophageal cancer Neg Hx     Social History   Tobacco Use  . Smoking status: Never Smoker  . Smokeless tobacco: Never Used  Substance Use Topics  .  Alcohol use: No    Alcohol/week: 0.0 standard drinks  . Drug use: No    Home Medications Prior to Admission medications   Medication Sig Start Date End Date Taking? Authorizing Provider  amiodarone (PACERONE) 200 MG tablet Take 1 tablet (200 mg total) by mouth 2 (two) times daily for 14 days, THEN 1 tablet (200 mg total) daily. Patient taking differently: Take 200 mg by mouth daily. 10/30/19 11/12/20  Pixie Casino, MD  aspirin EC 81 MG tablet Take 81 mg by mouth daily.    [provider]  atorvastatin  (LIPITOR) 20 MG tablet TAKE 1 TABLET BY MOUTH EVERYDAY AT BEDTIME Patient taking differently: Take 20 mg by mouth at bedtime.  12/02/18   Hilty, Nadean Corwin, MD  calcium carbonate (OSCAL) 1500 (600 Ca) MG TABS tablet Take 1,500 mg by mouth daily with breakfast.    [provider]  carvedilol (COREG) 6.25 MG tablet Take 2 tablets (12.5 mg total) by mouth every morning AND 1 tablet (6.25 mg total) every evening. 12.5 mg in the morning and 6.25mg  in the evening) (2 tablets in the Morning and 1 Tablet in the Evening). 10/21/19   Deberah Pelton, NP  ferrous sulfate 325 (65 FE) MG tablet Take 1 tablet (325 mg total) by mouth 2 (two) times daily with a meal. 05/13/15   Regalado, Belkys A, MD  furosemide (LASIX) 20 MG tablet Take 2 tablets (40mg ) by mouth in the morning and 1 tablet (20mg ) in the afternoon 12/16/19   Hilty, Nadean Corwin, MD  hydrALAZINE (APRESOLINE) 25 MG tablet Take 25 mg by mouth 2 (two) times daily. 09/20/19   [provider]  lidocaine (LIDODERM) 5 % Place 1 patch onto the skin daily. Remove & Discard patch within 12 hours or as directed by MD 12/19/19   Quintella Reichert, MD  Multiple Vitamin (MULTIVITAMIN WITH MINERALS) TABS tablet Take 1 tablet by mouth daily.    [provider]  oxyCODONE (ROXICODONE) 5 MG immediate release tablet Take 1 tablet (5 mg total) by mouth every 6 (six) hours as needed for up to 12 doses for severe pain. 12/21/19   Nikiya Starn, DO  tamsulosin (FLOMAX) 0.4 MG CAPS capsule Take 0.8 mg by mouth daily.    [provider]  vitamin B-12 (CYANOCOBALAMIN) 1000 MCG tablet Take 1 tablet (1,000 mcg total) by mouth daily. 05/13/15   Regalado, Belkys A, MD  warfarin (COUMADIN) 3 MG tablet TAKE 1 TO 1 & 1/2 TABLETS BY MOUTH DAILY AS DIRECTED BY COUMADIN CLINIC Patient taking differently: Take 3 mg by mouth daily.  10/13/19   Hilty, Nadean Corwin, MD    Allergies    Penicillins and Vancomycin  Review of Systems   Review of Systems  Constitutional:  Negative for chills and fever.  HENT: Negative for ear pain and sore throat.   Eyes: Negative for pain and visual disturbance.  Respiratory: Negative for cough and shortness of breath.   Cardiovascular: Negative for chest pain and palpitations.  Gastrointestinal: Negative for abdominal pain and vomiting.  Genitourinary: Negative for dysuria and hematuria.  Musculoskeletal: Positive for arthralgias and back pain.  Skin: Positive for color change (right hip bruise). Negative for rash.  Neurological: Negative for seizures, syncope and headaches.  All other systems reviewed and are negative.   Physical Exam Updated Vital Signs BP 126/78 (BP Location: Left Arm)   Pulse 70   Temp 98 F (36.7 C) (Oral)   Resp 18   Ht 5\' 6"  (  1.676 m)   Wt 59 kg   SpO2 98%   BMI 20.98 kg/m   Physical Exam Vitals and nursing note reviewed.  Constitutional:      Appearance: He is well-developed.  HENT:     Head: Normocephalic and atraumatic.  Eyes:     Conjunctiva/sclera: Conjunctivae normal.  Cardiovascular:     Rate and Rhythm: Normal rate and regular rhythm.     Pulses: Normal pulses.     Heart sounds: No murmur heard.   Pulmonary:     Effort: Pulmonary effort is normal. No respiratory distress.     Breath sounds: Normal breath sounds.  Abdominal:     Palpations: Abdomen is soft.     Tenderness: There is no abdominal tenderness.  Musculoskeletal:        General: Tenderness (TTP to bilateral hips) present. Normal range of motion.     Cervical back: Neck supple.  Skin:    General: Skin is warm and dry.     Findings: Bruising (to right/left hip/thigh area) present.  Neurological:     General: No focal deficit present.     Mental Status: He is alert and oriented to person, place, and time.     Sensory: No sensory deficit.     Motor: No weakness.     Comments: 5+ out of 5 strength throughout, normal sensation     ED Results / Procedures / Treatments   Labs (all labs ordered are  listed, but only abnormal results are displayed) Labs Reviewed - No data to display  EKG None  Radiology DG Pelvis 1-2 Views  Result Date: 12/21/2019 CLINICAL DATA:  Bilateral hip pain after fall 4 days prior EXAM: PELVIS - 1-2 VIEW COMPARISON:  CT abdomen pelvis 12/19/2018 FINDINGS: The osseous structures appear diffusely demineralized which may limit detection of small or nondisplaced fractures. Bones of the pelvis appear intact and congruent. Sacral arcs appear contiguous. Proximal femora are intact and normally located within the acetabula. Degenerative changes in the lower lumbar spine, hips and pelvis are mild-to-moderate. Mild soft tissue thickening is noted in the gluteal soft tissues, nonspecific. Vascular calcium noted throughout the pelvis and proximal thighs. Bowel gas pattern is unremarkable. IMPRESSION: 1. No acute fracture or dislocation of the pelvis. 2. Mild soft tissue thickening noted within the bilateral gluteal tissues and hips with gluteal hematoma seen on the comparison CT on the right and more contusive changes in the left on prior cross-sectional. If there is concern for increase in size or other complication, consider cross-sectional imaging. 3. Mild to moderate degenerative changes of the hips and pelvis. 4. Vascular calcium throughout the pelvis and proximal thighs. Electronically Signed   By: Lovena Le M.D.   On: 12/21/2019 18:49    Procedures Procedures (including critical care time)  Medications Ordered in ED Medications  lidocaine (LIDODERM) 5 % 1 patch (1 patch Transdermal Patch Applied 12/21/19 1832)  oxyCODONE (Oxy IR/ROXICODONE) immediate release tablet 5 mg (5 mg Oral Given 12/21/19 1733)    ED Course  I have reviewed the triage vital signs and the nursing notes.  Pertinent labs & imaging results that were available during my care of the patient were reviewed by me and considered in my medical decision making (see chart for details).    MDM  Rules/Calculators/A&P                          ILYAAS MUSTO is an 79 year old male who presents  to the ED with bilateral hip pain after fall 4 days ago.  Normal vitals.  No fever.  States that he was unable to fill the lidocaine patch prescription as pharmacy did not have it.  He has been using Tylenol with some relief.  Patient did have a CT scan 2 days after his original fall that was unremarkable except for right thigh hematoma.  He does have extensive bruising to the right thigh area.  He has been able to ambulate with his cane but has increased discomfort at times.  No new falls.  Is on blood thinners but has not had any other falls or injuries.  We will get a repeat pelvic x-ray to ensure no fracture but overall will give him a dose of Roxicodone for pain control here and a small prescription for outpatient use as well.  Recommend follow-up with primary care doctor for longer-term pain management.  Overall exam is unremarkable.  Neuro logically neurovascularly intact.  Has some tenderness to palpation his hips but good range of motion without any issues to bilateral hips.  Xray unremarkable. Patient feeling better after pain meds here. D/c in good condition. Recommend follow up with PCP for further care.  Placed order for social work to get in touch with family about home health and walker.  This chart was dictated using voice recognition software.  Despite best efforts to proofread,  errors can occur which can change the documentation meaning.    Final Clinical Impression(s) / ED Diagnoses Final diagnoses:  Acute pain of both hips    Rx / DC Orders ED Discharge Orders         Ordered    oxyCODONE (ROXICODONE) 5 MG immediate release tablet  Every 6 hours PRN        12/21/19 1843           Lennice Sites, DO 12/21/19 [place], Lind, DO 12/21/19 1901

## 2019-12-21 NOTE — Discharge Instructions (Addendum)
Recommend looking for over the counter lidocaine patch for pain control.

## 2019-12-22 ENCOUNTER — Encounter (HOSPITAL_COMMUNITY): Payer: Self-pay | Admitting: Emergency Medicine

## 2019-12-22 ENCOUNTER — Telehealth: Payer: Self-pay

## 2019-12-22 NOTE — Telephone Encounter (Signed)
Pt's wife called in stating that they were in hospital at [facility] long and they told him to hold coumadin. They would like to know if they need to hold until they come in on Wednesday. Been holding for 3 days. Or do they need a appt sooner

## 2019-12-22 NOTE — Telephone Encounter (Signed)
Called and spoke to pt's wife and they stated that the pt is unable to walk but they fully intend to come Wednesday to their dr appt w/hilty but Mrs. Bubolz wants to know what dose to give as he has been holding for 3 days already. They do wish to have home health as well. Will route to [facility] pharmd for further consideration for dosing and home health order

## 2019-12-22 NOTE — Progress Notes (Signed)
Patient will need wheelchair and hospital bed for safety and care at home after further assessment.

## 2019-12-22 NOTE — TOC Initial Note (Signed)
Transition of Care [facility]) - Initial/Assessment Note    Patient Details  Name: Robert [name] MRN: [id number] Date of Birth: 11-Feb-1941  Transition of Care [facility]) CM/SW Contact:    Erenest Rasher, RN Phone Number: [phone] 12/22/2019, 3:35 PM  Clinical Narrative:                 TOC CM contacted pt and gave permission to speak to wife. Offered choice for [facility]. Wife agreeable to [facility]. Wife states pt has cane. States he will need RW, wheelchair and hospital bed. Order completed and faxed to [place], rep. Zach for delivery of DME today. Contacted [facility], rep Ronalee Belts with new referral for start of care on 12/23/2019.   Expected Discharge Plan: [place] Barriers to Discharge: No Barriers Identified   Patient Goals and CMS Choice Patient states their goals for this hospitalization and ongoing recovery are:: patient wants to be home CMS Medicare.gov Compare Post Acute Care list provided to:: Patient Represenative (must comment) (wife-Robert [name]) Choice offered to / list presented to : Spouse  Expected Discharge Plan and Services Expected Discharge Plan: [place] In-house Referral: Clinical Social Work Discharge Planning Services: CM Consult Post Acute Care Choice: Home Health Living arrangements for the past 2 months: [place]                 DME Arranged: 3-N-1, Youth worker wheelchair with seat cushion, Walker rolling with seat, Trapeze DME Agency: [employer] Date DME Agency Contacted: 12/22/19 Time DME Agency Contacted: 82 Representative spoke with at DME Agency: Dawayne Patricia HH Arranged: PT, OT, Nurse's Aide, Social Work [employer] Agency: [facility] [employer] [facility] [employer] Date [place]: 12/22/19 Time [place]: 1528 Representative spoke with at [place]: Burlene Arnt  Prior Living Arrangements/Services Living arrangements for the past 2 months: [place]  with:: Spouse   Do you feel safe going back to the place where you live?: Yes          Current home services: DME (cane)    Activities of Daily Living      Permission Sought/Granted Permission sought to share information with : Case Manager, PCP, Family Supports Permission granted to share information with : Yes, Verbal Permission Granted  Share Information with NAME: Robert [name]  Permission granted to share info w AGENCY: [facility], DME agency  Permission granted to share info w Relationship: husband     Emotional Assessment       Orientation: : Oriented to Self, Oriented to Place, Oriented to  Time, Oriented to Situation   Psych Involvement: No (comment)  Admission diagnosis:  Fall, Hip pain Patient Active Problem List   Diagnosis Date Noted   Stroke [facility])    Essential hypertension 02/06/2017   Cerebrovascular accident (CVA) due to embolism of left middle cerebral artery ([place]) 12/01/2016   Acute ischemic stroke ([place]) 24/58/0998   Acute systolic congestive heart failure, NYHA class 3 ([place]) 04/18/2016   DOE (dyspnea on exertion) 04/07/2016   Abnormal EKG 10/18/2015   AKI (acute kidney injury) ([place])    BPH (benign prostatic hyperplasia) 08/29/2015   Elevated troponin 08/29/2015   SBO (small bowel obstruction) ([place]) 08/29/2015   Pancreatitis 08/29/2015   Protein-calorie malnutrition, severe ([place]) 08/29/2015   Hyperlipidemia 08/29/2015   Noncompliance with medications 05/26/2015   Long term (current) use of anticoagulants 05/18/2015   Coronary artery disease involving native coronary artery of  native heart without angina pectoris    Chronic systolic (congestive) heart failure ([place]) 05/06/2015   Hypertensive urgency 05/06/2015   CKD (chronic kidney disease) stage 3, GFR 30-59 ml/min (HCC) 05/06/2015   Chronic anemia 05/06/2015   H/O mitral valve replacement with mechanical valve 05/06/2015   CHF (congestive heart failure) ([place]) 05/06/2015    PCP:  Bonnita Nasuti, MD Pharmacy:   [id number] [place], [address] [place] [address] Phone: [phone] Fax: [phone]  [facility] [place] [address] [facility] to Registered [employer] Sites [place] [address] Phone: [phone] Fax: [phone]     Social Determinants of Health (SDOH) Interventions    Readmission Risk Interventions No flowsheet data found.

## 2019-12-22 NOTE — Telephone Encounter (Signed)
Patient's pain not improved from Thursday to Sunday. Had to go to Emergency room yesterday and he received Rx for oxycodone.  Patient was instructed to HOLD atorvastatin until follow up with Dr Debara Pickett, and resume warfarin at 1.5mg  daily.  Next INR in 2 days

## 2019-12-24 ENCOUNTER — Ambulatory Visit (INDEPENDENT_AMBULATORY_CARE_PROVIDER_SITE_OTHER): Payer: BC Managed Care – PPO | Admitting: Pharmacist Clinician (PhC)/ Clinical Pharmacy Specialist

## 2019-12-24 ENCOUNTER — Other Ambulatory Visit: Payer: Self-pay

## 2019-12-24 ENCOUNTER — Ambulatory Visit (INDEPENDENT_AMBULATORY_CARE_PROVIDER_SITE_OTHER): Payer: BC Managed Care – PPO | Admitting: Internal Medicine

## 2019-12-24 ENCOUNTER — Encounter: Payer: Self-pay | Admitting: Internal Medicine

## 2019-12-24 VITALS — BP 104/56 | HR 88 | Ht 66.0 in | Wt 128.0 lb

## 2019-12-24 DIAGNOSIS — N183 Chronic kidney disease, stage 3 unspecified: Secondary | ICD-10-CM | POA: Diagnosis not present

## 2019-12-24 DIAGNOSIS — I4819 Other persistent atrial fibrillation: Secondary | ICD-10-CM | POA: Diagnosis not present

## 2019-12-24 DIAGNOSIS — Z952 Presence of prosthetic heart valve: Secondary | ICD-10-CM | POA: Diagnosis not present

## 2019-12-24 DIAGNOSIS — Z7901 Long term (current) use of anticoagulants: Secondary | ICD-10-CM | POA: Diagnosis not present

## 2019-12-24 DIAGNOSIS — R296 Repeated falls: Secondary | ICD-10-CM

## 2019-12-24 LAB — POCT INR: INR: 2.7 (ref 2.0–3.0)

## 2019-12-24 NOTE — Patient Instructions (Signed)
Medication Instructions:  Your physician recommends that you continue on your current medications as directed. Please refer to the Current Medication list given to you today.  *If you need a refill on your cardiac medications before your next appointment, please call your pharmacy*   Follow-Up: At [facility], you and your health needs are our priority.  As part of our continuing mission to provide you with exceptional heart care, we have created designated Provider Care Teams.  These Care Teams include your primary Cardiologist (physician) and Advanced Practice Providers (APPs -  Physician Assistants and Nurse Practitioners) who all work together to provide you with the care you need, when you need it.  We recommend signing up for the patient portal called "MyChart".  Sign up information is provided on this After Visit Summary.  MyChart is used to connect with patients for Virtual Visits (Telemedicine).  Patients are able to view lab/test results, encounter notes, upcoming appointments, etc.  Non-urgent messages can be sent to your provider as well.   To learn more about what you can do with MyChart, go to [email or web address].    Your next appointment:   3 month(s)  The format for your next appointment:   In Person  Provider:   You may see Pixie Casino, MD or one of the following Advanced Practice Providers on your designated Care Team:    Almyra Deforest, PA-C  Fabian Sharp, PA-C or   Roby Lofts, [place]    Other Instructions  Dr. Debara Pickett advised to follow up with your primary care provider to discuss home health services

## 2019-12-24 NOTE — Progress Notes (Signed)
OFFICE NOTE  Chief Complaint:  Follow-up afib  Primary Care Physician: Bonnita Nasuti, MD  HPI:  Robert [name] is a 79 y.o. 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 He has been followed by Dr. Geraldo Pitter at [facility] cardiology and was referred to Dr. Evelina Dun at [facility] in 2013 for evaluation of his mitral stenosis. He was followed clinically and underwent mechanical mitral valve replacement in 2015. Since then he's been compliant on warfarin and has had therapeutic to supratherapeutic INRs. LV function was normal prior to surgery however we cannot find echocardiographic results after his surgery. A heart catheterization performed prior to surgery demonstrated a 70% diagonal stenosis and 55% LAD stenosis, but he did not have any bypass surgery. He now presents with one week of progressive shortness of breath, orthopnea and leg edema concerning for heart failure. He seems to be responding to IV Lasix. Chest x-ray shows COPD changes, small bilateral pleural effusions and some basilar atelectasis. Labs indicate markedly elevated BNP of 1406 and troponin is negative 3. There appears to be a mild iron deficiency anemia with H&H of 9 and 29. INR initially was 4.37 that came down to 3.98. He was admitted and treated for heart failure with diuresis. He underwent right and left heart catheterization which demonstrated the following:   Prox RCA lesion, 20% stenosed.  Prox LAD to Mid LAD lesion, 30% stenosed.  Mid LAD lesion, 40% stenosed.  Ost 1st Diag to 1st Diag lesion, 50% stenosed.  Ost 2nd Diag lesion, 30% stenosed.  1. Mild to moderate non-obstructive CAD 2. Normal movement of mechanical mitral valve leaflets.  3. Elevated filling pressures suggesting residual volume overload.   He also had an echocardiogram which showed a newly reduced LVEF of 45-50%. At  discharge he was scheduled to be on Lasix 60 mg daily, and increase from his prior home dose of 40 mg daily. It was also recommended that he discontinue valsartan and spironolactone. After discharge he had routine lab work which demonstrated acute renal failure and creatinine had increased from 1.8-2.9. He was also markedly hyperkalemic at 5.7. This was brought to the attention of the [facility] DOD Dr. Marlou Porch, who recommended hydration and avoiding potassium with a repeat metabolic profile the next day. The repeat metabolic profile showed creatinine had decreased to 2.7 and potassium was 5.3. Since discharge Robert [name] had poor appetite, weight has remained stable and he is felt weak and had poor energy. Much worse than when he was discharged. I spent a good deal of time reviewing the patient's medications with his wife who had a 3 x 5 note card with his previous medications on the front and new medications on the rear. She tells me that he is actually taking all of these medications, therefore he is taking his old dose of Lasix 40 mg in addition to Lasix 60 mg daily as well as spironolactone and valsartan. Certainly explain his acute renal failure as well as hyperkalemia. I reviewed his discharge medication summary which clearly states to discontinue valsartan and Lasix and noted the dose changes of medications. He was seen in his primary care doctor's office after discharge however it appears that these medication changes were not identified. In addition, his INR is markedly elevated today at 6.2. This is up from 3.5 where it was 1 week ago. I suspect again this is related to renal failure, decreased appetite  and nutritional reasons. Medication noncompliance could be playing a role.  Robert [name] returns today for follow-up. Since we sorted out his medications he's done much better. His creatinine has gone back down to baseline of 1.8. He is on iron and B12 and his hemoglobin and hematocrit of come up to 10.7 and  32. He reports marked improvement in his energy. He does say that he has problems with decreased energy and sleep although he feels like he gets a restful night sleep. He denies any snoring or apnea. He wakes up fairly early in the morning and does yoga and often takes a nap in the morning. Unfortunately his INR became subtherapeutic after holding his warfarin for coagulopathy. On the 20th it was 1.4. Today his INR is 1.8.  10/18/2015  Robert [name] returns today for follow-up of 2 recent hospitalizations. He was seen at [facility] and subsequently at [facility] a couple weeks later for small bowel obstruction. He does have a history of gastric cancer and is status post subtotal gastrectomy in 2005. Unfortunately he has not been able to eat and has lost a significant amount of weight. He is currently 119 pounds with a BMI of 19 and does feel somewhat weak. Fortunately, however his LV function has improved back to 55-60% by echo. He was seen in consultation and no further cardiac workup was recommended as he did have some mildly elevated troponins. Systolic to be nonspecific. He's had no problems with his mechanical mitral valve and INR today is finally controlled at 3.5. He tells me he is interested in getting some dental work including having several teeth pulled and having a denture placed at [facility] in the near future.  04/07/2016  Robert [name] presents today with some recent dyspnea on exertion. He says over the past 2 weeks she's become short of breath walking up stairs and now walking on a flat surface. EF had improved recently up to 55-60% by echo, however this was during hospitalization for a small bowel obstruction. At the time he did have elevated troponins but was felt due to a noncardiac etiology. He denies any chest pain however his progressive dyspnea and fatigue with exertion could be due to coronary ischemia. He was noted to have moderate nonobstructive coronary disease by cath more than a  year ago.  04/18/2016  Robert [name] was seen back today in follow-up. He reports some continued shortness of breath and now some lower strandy swelling. He underwent a Lexiscan Myoview which showed no ischemia however EF was back to 45%. He has had 7 pound weight gain since I last saw him consistent with acute systolic congestive heart failure. He is only on Lasix 20 mg every other day.  05/15/2016  Robert [name] returns today for follow-up. Blood pressure is elevated 156/70. He reports that increasing his Lasix has helped with some of his shortness of breath and abdominal swelling. Unfortunately did not get blood work drawn as I had requested. He is due for repeat metabolic profile and BNP. We discussed briefly the addition of Entresto he may be a good candidate for that. I like to evaluate his labs today and make a determination based on his renal function. He also is reported recently a decrease in appetite and early satiety, particularly with breads and other foods that he cannot tolerate. This sounds like more of a GI issue and I've encouraged him to follow-up with his gastroenterologist in [place].  06/06/2016  Robert [name] was seen today in follow-up.  He reports breathing improved significantly as well as the swelling reduced after adding a second dose of diuretics to his regimen. Currently he is on Lasix 40 mg twice a day. Weight is gone down from 131-127 pounds. He has no lower extremity edema. In fact he is close to underweight, more consistent with the fact that his diet is very picky and he continues to have problems with his GI system. His wife says that he eats very scant portions. The pressure is well-controlled today. He has not had repeat lab work which I recommended to reassess his creatinine. In the past his creatinine is been somewhat higher on increased dose of diuretics, but we may need to allow this. I'd also like to see if his chronic kidney disease would support the use of  Entresto.  12/07/2016  Robert [name] was unfortunately recently hospitalized with aphasia and weakness and was found to have multiple vessel infarcts including the left MCA, right MCA/PCA, right MCA/ACA territory concerning for a cardioembolic phenomenon. It was thought this is related to a mechanical mitral valve and being subtherapeutic with his INR. He is confused as to recently as soon as 2 weeks prior to the incident his INR was well controlled. He had reported problems with eating and was being treated by gastroenterologist for what sounds like SIBO. It's possible that the combination of his antibiotics including Cipro and Flagyl, particularly the latter, may have affected his INR. His admission INR was 2.31 however subsequent INRs were 1.74 and 1.35. He is now on twice-daily Lovenox bridging and we are working to reestablish a therapeutic INR. Fortunately he seems to have some mild deficits with mild left facial droop and some dysarthria.  03/14/2017  Robert [name] was seen today in follow-up.  He has had no further stroke or TIA symptoms.  He saw Dr. Erlinda Hong in the fall who felt like he was doing well.  We have managed to keep his INR in the 3-3.5 range.  He has been more compliant with his medication.  He now gets INR checks every 2 weeks.  Recently his blood pressure has been running higher.  His carvedilol was increased from 6.25 twice a day to 12.5 twice a day.  His wife reports no significant change in his blood pressure with this.  At times he gets a little dizzy which may be associated with his high blood pressure.  04/26/2017  Robert [name] was seen today in follow-up.  We have been working to improve his blood pressures.  For some reason they have increased recently.  He has been placed on hydralazine 25 mg 3 times daily in addition to his other medications and pressure today was 124/60.  Interestingly number blood pressures at home have ranged between 086 and 761 systolic.  He does have a relatively new blood  pressure cuff which is only 42 months old.  He is also complaining of shortness of breath today.  He says over the past several weeks he has been more short of breath with exertion, particularly walking up inclines or stairs.  Weight seems to be increased about 10 pounds over the past 2-4 weeks.  He reports some pitting edema of his ankles.  06/07/2017  Robert [name] returns for follow-up.  His INR was checked today was slightly elevated at 4.0.  His weight has continued to trend down.  He is not eating as much is he has been recently.  He has been using increased dose Lasix 40 mg daily and reports  improvement in breathing and swelling.  This is at the cost of a slight increase in creatinine which were tolerating.  He is also been anemic, which appears to be related to chronic blood loss in the setting of anticoagulation.  He seen GI and is being seen by hematology within the [place] [facility].  Of note he is bradycardic today with heart rates in the 40s.  This is consistently trended down over the past month and seems to be associated with his weight loss which is down 7 pounds.  11/27/2017  Robert [name] is seen today in follow-up.  Overall he seems to be doing well.  He had a repeat INR today which was 2.7.  He recently went on Lovenox for a colonoscopy which was negative.  He does report lower extremity edema.  He says he has some discomfort he notes his legs after standing for several hours.  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  It tends to be worse at the end of the day.  He notes that after elevating his feet at night the symptoms improve.  Weight has actually been fairly stable.  06/16/2019  Robert [name] returns today for follow-up.  Actually have seen him numerous times via telemedicine visit however recently he was noted to have a supratherapeutic INR.  This is after an infection for which he was receiving antibiotics and steroids.  His INR was over 8.  Subsequently warfarin was held and then he  became subtherapeutic with INR of 1.3.  His PCP checked at 1.2.  Today it was rechecked at 2.4.  Of note his labs a week ago though showed that he has a significant anemia.  Hemoglobin was 8.3 with hematocrit of 25.6.  This compared to labs we had 2 years ago which showed a hemoglobin of 12.4 and hematocrit 37.5.  I was able to review labs from his PCP in February 2021 which showed a hemoglobin of 10 indicating about a two-point drop.  He denies any melena or any bright red blood per rectum.  He has had no blood in the urine.  He reportedly had had a colonoscopy a couple years ago which did not show any significant disease.  He does have a mitral valve replacement which is mechanical.  His INR targets are between 3 and 3.5 due to his history of stroke as well.  I did as his normal), hemoglobin 8.6, RBC 2.88, hematocrit to 27.7  his PCP to evaluate him regarding this anemia which could be chronic as he has chronic kidney disease, could be related to iron deficiency or other etiologies.  Essentially they ordered lab work but have not heard back from the provider regarding these results.  We were able to obtain that today and I personally reviewed it.  This demonstrated an adequate B12 level over 8786, high folic acid level, PTT of 26, serum ferritin 147, hematocrit 27.7 with a normal MCV.  Iron studies are as follows: Iron saturation 24, serum iron 70, U IBC 227, TIBC 297 (all normal).  Reticulocyte count was 5.3 indicating adequate bone marrow response.  Haptoglobin was low at less than 10.  LDH was elevated 482.  Creatinine had improved somewhat at 1.67 (he had recent increase in his diuretics).  Despite this lab work, he reports continued fatigue and decrease in energy.  He says weakness is present in his legs.  He did not have a stool card because it was not available however they intend to get that  to see if there is any evidence of occult bleeding.  07/31/2019  Robert [name] returns today for follow-up.  He says  he is feeling overall little better.  His hemoglobin has increased from 8-10 without any transfusion.  His INR has been better controlled although was elevated today at 4.4.  This will be further adjusted by her pharmacist.  He does have a follow-up with [facility], Dr. Lyndel Safe in [facility] for further evaluation and what sounds like a capsule endoscopy.  He has had some instability weakness in his legs.  His wife is requesting a prescription for a cane.  I have encouraged some more strengthening and physical activity.  In addition he should wear compression stockings for his persistent ankle/foot edema.  I he appears euvolemic today and weight has been stable.  He is currently taking Lasix 40 mg daily.  11/11/2019  Robert [name] seen today in follow-up.  Recently he was noted to have atrial fibrillation which is new onset.  His recent decline in LVEF to 35 to 40% with moderate RV dysfunction and RVSP of 51 mmHg.  There is severe biatrial enlargement.  The mitral prosthesis appears to be working properly.  He reports increasing fatigue and some recent worsening swelling.  This is likely decompensated heart failure.  He is on warfarin with a therapeutic INR and we discussed the possibility of a cardioversion.  My feeling is that he has a low likelihood of conversion given severe biatrial enlargement without antiarrhythmic therapy.  Based on that I recommended starting amiodarone, initially 200 mg twice daily for 2 weeks then 200 mg daily.  In addition, we have been checking weekly INRs and if he remains therapeutic, will proceed with elective cardioversion on September 24.  EKG today shows persistent A. fib with controlled ventricular response.  12/24/2019  Robert [name] is seen today in follow-up.  Unfortunately since I last saw him we were trying to get therapeutic INRs but that is been challenging.  His INR has been supratherapeutic and I suspect is been related to decrease in dietary intake.  He is also been unstable  on his feet and I received a phone note which indicated he had a fall at home.  This causes significant hematoma and pain in the right buttocks and right thigh area.  He was then directed to the emergency department for which he underwent a CT scan that showed a large hematoma but no evidence of any hip fracture.  Subsequently he has had difficult to control pain related to this.  His INRs have still been labile and and was rechecked today at 2.7.  Because of the lability, recent fall and concern for ongoing falls, at this point I do not think it would be reasonable to proceed with a cardioversion.  I discussed this with the patient, his wife and son who were present and his brother-in-law who is a physician via telephone.  EKG today shows persistent A. fib at 88.  Blood pressure was low at 104/56.  His creatinine has been elevated around 2.3-2.4 but he appears to be euvolemic.  This is currently on 40 mg of Lasix in the morning and 20 in the afternoon.  Bilirubin was also noted to be elevated and I suspect this is related to breakdown products of his hematoma.  Exam is notable for some scleral icterus.  PMHx:  Past Medical History:  Diagnosis Date  . BPH (benign prostatic hyperplasia)   . Chronic anticoagulation    coumadin for  mechanical valves  . CKD (chronic kidney disease), stage III ([place])   . Diabetes mellitus type 2, diet-controlled ([place])   . Gastric cancer ([place]) 2005   s/p gastrectomy  . High cholesterol   . HOH (hard of hearing)   . Hypertension   . Rheumatic heart disease   . S/P MVR (mitral valve replacement) 2015   31mm StJude mechanical valve  . Stroke [facility])     Past Surgical History:  Procedure Laterality Date  . CARDIAC CATHETERIZATION N/A 05/10/2015   Procedure: Right/Left Heart Cath and Coronary Angiography;  Surgeon: Burnell Blanks, MD;  Location: [place] [facility];  Service: Cardiovascular;  Laterality: N/A;  . COLONOSCOPY  11/22/2017   [place]    . IR ANGIO INTRA EXTRACRAN SEL COM CAROTID INNOMINATE BILAT MOD SED  12/01/2016  . IR ANGIO VERTEBRAL SEL SUBCLAVIAN INNOMINATE UNI R MOD SED  12/01/2016  . MITRAL VALVE REPLACEMENT  10/24/2013   26mm StJude mechanical valve  . PARTIAL GASTRECTOMY  2005  . SMALL BOWEL ENTEROSCOPY  11/22/2017   [facility]     FAMHx:  Family History  Problem Relation Age of Onset  . Hypertension Mother   . Diabetes Mother   . Cancer Brother   . Colon cancer Neg Hx   . Esophageal cancer Neg Hx     SOCHx:   reports that he has never smoked. He has never used smokeless tobacco. He reports that he does not drink alcohol and does not use drugs.  ALLERGIES:  Allergies  Allergen Reactions  . Penicillins Nausea And Vomiting    08/23/2018 pt has recently tolerated Amoxicillin per pt and wife Has patient had a PCN reaction causing immediate rash, facial/tongue/throat swelling, SOB or lightheadedness with hypotension: No Has patient had a PCN reaction causing severe rash involving mucus membranes or skin necrosis: No Has patient had a PCN reaction that required hospitalization: Unknown Has patient had a PCN reaction occurring within the last 10 years: No If all of the above answers are "NO", then may proceed with Cephalospor  . Vancomycin Other (See Comments)    Kidney problems    ROS: Pertinent items noted in HPI and remainder of comprehensive ROS otherwise negative.  HOME MEDS: Current Outpatient Medications  Medication Sig Dispense Refill  . amiodarone (PACERONE) 200 MG tablet Take 1 tablet (200 mg total) by mouth 2 (two) times daily for 14 days, THEN 1 tablet (200 mg total) daily. (Patient taking differently: Take 200 mg by mouth daily.) 135 tablet 3  . aspirin EC 81 MG tablet Take 81 mg by mouth daily.    Marland Kitchen atorvastatin (LIPITOR) 20 MG tablet TAKE 1 TABLET BY MOUTH EVERYDAY AT BEDTIME (Patient taking differently: Take 20 mg by mouth at bedtime. ) 60 tablet 10  . calcium carbonate (OSCAL)  1500 (600 Ca) MG TABS tablet Take 1,500 mg by mouth daily with breakfast.    . carvedilol (COREG) 6.25 MG tablet Take 2 tablets (12.5 mg total) by mouth every morning AND 1 tablet (6.25 mg total) every evening. 12.5 mg in the morning and 6.25mg  in the evening) (2 tablets in the Morning and 1 Tablet in the Evening). 90 tablet 3  . ferrous sulfate 325 (65 FE) MG tablet Take 1 tablet (325 mg total) by mouth 2 (two) times daily with a meal. 30 tablet 3  . furosemide (LASIX) 20 MG tablet Take 2 tablets (40mg ) by mouth in the morning and 1 tablet (20mg )  in the afternoon 90 tablet 3  . hydrALAZINE (APRESOLINE) 25 MG tablet Take 25 mg by mouth 2 (two) times daily.    Marland Kitchen lidocaine (LIDODERM) 5 % Place 1 patch onto the skin daily. Remove & Discard patch within 12 hours or as directed by MD 15 patch 0  . Multiple Vitamin (MULTIVITAMIN WITH MINERALS) TABS tablet Take 1 tablet by mouth daily.    Marland Kitchen oxyCODONE (ROXICODONE) 5 MG immediate release tablet Take 1 tablet (5 mg total) by mouth every 6 (six) hours as needed for up to 12 doses for severe pain. 12 tablet 0  . tamsulosin (FLOMAX) 0.4 MG CAPS capsule Take 0.8 mg by mouth daily.    . vitamin B-12 (CYANOCOBALAMIN) 1000 MCG tablet Take 1 tablet (1,000 mcg total) by mouth daily. 7 tablet 0  . warfarin (COUMADIN) 3 MG tablet TAKE 1 TO 1 & 1/2 TABLETS BY MOUTH DAILY AS DIRECTED BY COUMADIN CLINIC (Patient taking differently: Take 3 mg by mouth daily. ) 120 tablet 0   No current facility-administered medications for this visit.    LABS/IMAGING: No results found for this or any previous visit (from the past 48 hour(s)). No results found.  WEIGHTS: Wt Readings from Last 3 Encounters:  12/24/19 128 lb (58.1 kg)  12/21/19 130 lb (59 kg)  12/19/19 130 lb (59 kg)    VITALS: BP (!) 104/56 (BP Location: Left Arm, Patient Position: Sitting, Cuff Size: Normal)   Pulse 88   Ht 5\' 6"  (1.676 m)   Wt 128 lb (58.1 kg)   BMI 20.66 kg/m   EXAM: General appearance:  alert, cachectic and no distress Neck: JVD - 3 cm above sternal notch, no carotid bruit and thyroid not enlarged, symmetric, no tenderness/mass/nodules Lungs: diminished breath sounds bilaterally Heart: irregularly irregular rhythm, S1, S2 normal and systolic murmur: early systolic 2/6, crescendo at 2nd right intercostal space Abdomen: soft, non-tender; bowel sounds normal; no masses,  no organomegaly Extremities: edema 1-2+ edema Pulses: 2+ and symmetric Skin: Skin color, texture, turgor normal. No rashes or lesions Neurologic: Grossly normal Psych: Pleasant  EKG: A. fib at 88, ST and T wave changes laterally, prolonged QTC at 525 ms-personally reviewed  ASSESSMENT: 1. 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 Symptomatic anemia/weakness 3. Supratherapeutic INR 4. Symptomatic bradycardia 5. Acute systolic congestive heart failure-EF reduced to 45%, up to 55-60% (2017) 6. Recent multi-infarct territory stroke-concerning for cardioembolic phenomenon  7. Mild to moderate nonobstructive coronary artery disease by cath 8. Normally functioning mechanical mitral valve 9. Warfarin coagulopathy 10. Acute on chronic kidney disease stage III 11. Medication noncompliance 12. Hypertension  PLAN: 1.   Mr. Grider has recently been struggling with fatigue, falls, weakness and supratherapeutic INR.  Blood pressure has been elevated but is low normal today.  I am hesitant to proceed with a cardioversion due to his labile INRs and the fact that we would commit him to therapeutic warfarin after his cardioversion.  He has had some ongoing heart failure issues but it seems to be euvolemic more today.  Despite this he has had some elevation in his creatinine.  We will continue to adjust his warfarin.  I have encouraged him to reach out to his PCP to arrange for home health services and PT to help with stability and reduce  the risk of falls.  We will plan to see him back in 3 months or sooner as necessary.  Pixie Casino, MD,  FACC, [place] Director of the [facility] Attending Cardiologist  Direct Dial: [phone]  Fax: [phone]  Website:  [email or web address] 12/24/2019, 1:41 PM

## 2019-12-25 ENCOUNTER — Other Ambulatory Visit: Payer: Self-pay | Admitting: Internal Medicine

## 2019-12-26 ENCOUNTER — Other Ambulatory Visit: Payer: Self-pay | Admitting: Internal Medicine

## 2019-12-30 ENCOUNTER — Other Ambulatory Visit: Payer: Self-pay | Admitting: Internal Medicine

## 2019-12-30 NOTE — Telephone Encounter (Signed)
Rx has been sent to the pharmacy electronically. ° °

## 2020-01-02 ENCOUNTER — Ambulatory Visit (INDEPENDENT_AMBULATORY_CARE_PROVIDER_SITE_OTHER): Payer: BC Managed Care – PPO | Admitting: Pharmacist Clinician (PhC)/ Clinical Pharmacy Specialist

## 2020-01-02 ENCOUNTER — Other Ambulatory Visit: Payer: Self-pay

## 2020-01-02 DIAGNOSIS — Z952 Presence of prosthetic heart valve: Secondary | ICD-10-CM | POA: Diagnosis not present

## 2020-01-02 DIAGNOSIS — Z7901 Long term (current) use of anticoagulants: Secondary | ICD-10-CM

## 2020-01-02 LAB — POCT INR: INR: 2.6 (ref 2.0–3.0)

## 2020-01-03 ENCOUNTER — Emergency Department (HOSPITAL_BASED_OUTPATIENT_CLINIC_OR_DEPARTMENT_OTHER)
Admission: EM | Admit: 2020-01-03 | Discharge: 2020-01-03 | Disposition: A | Discharge status: Home / Self Care | Payer: BC Managed Care – PPO | Attending: Emergency Medicine | Admitting: Emergency Medicine

## 2020-01-03 ENCOUNTER — Encounter (HOSPITAL_BASED_OUTPATIENT_CLINIC_OR_DEPARTMENT_OTHER): Payer: Self-pay | Admitting: *Deleted

## 2020-01-03 ENCOUNTER — Emergency Department (HOSPITAL_BASED_OUTPATIENT_CLINIC_OR_DEPARTMENT_OTHER): Payer: BC Managed Care – PPO

## 2020-01-03 ENCOUNTER — Other Ambulatory Visit: Payer: Self-pay

## 2020-01-03 DIAGNOSIS — M545 Low back pain, unspecified: Secondary | ICD-10-CM

## 2020-01-03 DIAGNOSIS — I2510 Atherosclerotic heart disease of native coronary artery without angina pectoris: Secondary | ICD-10-CM | POA: Insufficient documentation

## 2020-01-03 DIAGNOSIS — I5021 Acute systolic (congestive) heart failure: Secondary | ICD-10-CM | POA: Diagnosis not present

## 2020-01-03 DIAGNOSIS — S32020A Wedge compression fracture of second lumbar vertebra, initial encounter for closed fracture: Secondary | ICD-10-CM | POA: Insufficient documentation

## 2020-01-03 DIAGNOSIS — I130 Hypertensive heart and chronic kidney disease with heart failure and stage 1 through stage 4 chronic kidney disease, or unspecified chronic kidney disease: Secondary | ICD-10-CM | POA: Insufficient documentation

## 2020-01-03 DIAGNOSIS — D649 Anemia, unspecified: Secondary | ICD-10-CM | POA: Diagnosis not present

## 2020-01-03 DIAGNOSIS — K5903 Drug induced constipation: Secondary | ICD-10-CM | POA: Insufficient documentation

## 2020-01-03 DIAGNOSIS — W19XXXA Unspecified fall, initial encounter: Secondary | ICD-10-CM | POA: Insufficient documentation

## 2020-01-03 DIAGNOSIS — S3992XA Unspecified injury of lower back, initial encounter: Secondary | ICD-10-CM | POA: Diagnosis present

## 2020-01-03 DIAGNOSIS — E1122 Type 2 diabetes mellitus with diabetic chronic kidney disease: Secondary | ICD-10-CM | POA: Insufficient documentation

## 2020-01-03 DIAGNOSIS — N183 Chronic kidney disease, stage 3 unspecified: Secondary | ICD-10-CM | POA: Diagnosis not present

## 2020-01-03 DIAGNOSIS — Z85028 Personal history of other malignant neoplasm of stomach: Secondary | ICD-10-CM | POA: Diagnosis not present

## 2020-01-03 LAB — COMPREHENSIVE METABOLIC PANEL
ALT: 25 IU/L (ref 0–44)
AST: 44 IU/L — ABNORMAL HIGH (ref 0–40)
Albumin/Globulin Ratio: 1.3 (ref 1.2–2.2)
Albumin: 3.6 g/dL — ABNORMAL LOW (ref 3.7–4.7)
Alkaline Phosphatase: 79 IU/L (ref 44–121)
BUN/Creatinine Ratio: 16 (ref 10–24)
BUN: 43 mg/dL — ABNORMAL HIGH (ref 8–27)
Bilirubin Total: 1.8 mg/dL — ABNORMAL HIGH (ref 0.0–1.2)
CO2: 27 mmol/L (ref 20–29)
Calcium: 8.3 mg/dL — ABNORMAL LOW (ref 8.6–10.2)
Chloride: 103 mmol/L (ref 96–106)
Creatinine, Ser: 2.64 mg/dL — ABNORMAL HIGH (ref 0.76–1.27)
GFR calc Af Amer: 25 mL/min/{1.73_m2} — ABNORMAL LOW (ref >59)
GFR calc non Af Amer: 22 mL/min/{1.73_m2} — ABNORMAL LOW (ref >59)
Globulin, Total: 2.8 g/dL (ref 1.5–4.5)
Glucose: 170 mg/dL — ABNORMAL HIGH (ref 65–99)
Potassium: 4.2 mmol/L (ref 3.5–5.2)
Sodium: 142 mmol/L (ref 134–144)
Total Protein: 6.4 g/dL (ref 6.0–8.5)

## 2020-01-03 LAB — CBC WITH DIFFERENTIAL/PLATELET
Abs Immature Granulocytes: 0.03 10*3/uL (ref 0.00–0.07)
Basophils Absolute: 0 10*3/uL (ref 0.0–0.1)
Basophils Relative: 1 %
Eosinophils Absolute: 0.2 10*3/uL (ref 0.0–0.5)
Eosinophils Relative: 4 %
HCT: 26.9 % — ABNORMAL LOW (ref 39.0–52.0)
Hemoglobin: 8.4 g/dL — ABNORMAL LOW (ref 13.0–17.0)
Immature Granulocytes: 1 %
Lymphocytes Relative: 7 %
Lymphs Abs: 0.4 10*3/uL — ABNORMAL LOW (ref 0.7–4.0)
MCH: 32.7 pg (ref 26.0–34.0)
MCHC: 31.2 g/dL (ref 30.0–36.0)
MCV: 104.7 fL — ABNORMAL HIGH (ref 80.0–100.0)
Monocytes Absolute: 0.5 10*3/uL (ref 0.1–1.0)
Monocytes Relative: 8 %
Neutro Abs: 4.9 10*3/uL (ref 1.7–7.7)
Neutrophils Relative %: 79 %
Platelets: 194 10*3/uL (ref 150–400)
RBC: 2.57 MIL/uL — ABNORMAL LOW (ref 4.22–5.81)
RDW: 21.8 % — ABNORMAL HIGH (ref 11.5–15.5)
Smear Review: NORMAL
WBC: 6 10*3/uL (ref 4.0–10.5)
nRBC: 0 % (ref 0.0–0.2)

## 2020-01-03 MED ORDER — SENNOSIDES-DOCUSATE SODIUM 8.6-50 MG PO TABS: 1.0000 | ORAL_TABLET | Freq: Every evening | ORAL | 0 refills | Status: AC | PRN

## 2020-01-03 MED ORDER — FLEET ENEMA 7-19 GM/118ML RE ENEM
1.0000 | ENEMA | Freq: Once | RECTAL | Status: AC
  Administered 2020-01-03: 1 via RECTAL
  Filled 2020-01-03: qty 1

## 2020-01-03 MED ORDER — LIDOCAINE VISCOUS HCL 2 % MT SOLN
15.0000 mL | Freq: Once | OROMUCOSAL | Status: AC
  Administered 2020-01-03: 15 mL via OROMUCOSAL
  Filled 2020-01-03: qty 15

## 2020-01-03 MED ORDER — LINACLOTIDE 290 MCG PO CAPS: 290.0000 ug | ORAL_CAPSULE | Freq: Every day | ORAL | 0 refills | Status: DC

## 2020-01-03 MED ORDER — DICLOFENAC SODIUM 1 % EX GEL: 2.0000 g | Freq: Four times a day (QID) | CUTANEOUS | 0 refills | Status: AC | PRN

## 2020-01-03 NOTE — Discharge Instructions (Addendum)
You were seen in the emergency department today with lower back pain and constipation.  You have a compression fracture of your L2 vertebra. I have sent a referral to Interventional Radiology for consideration of a procedure called Kyphoplasty to help with the pain from that fracture but you will need to discuss the risks of stopping your coumadi with your Heart Doctor. I have called in several constipation medications to your pharmacy.  Your blood work today did show red blood cell counts which were lower than from October 8.  This is likely from the fall and bleeding into the area of bruising on your buttocks.  Your primary care doctor will need to repeat these blood tests next week to ensure your blood counts are not dropping further. Please call to make them aware on Monday morning.

## 2020-01-03 NOTE — ED Triage Notes (Addendum)
Pt reports 2 falls in October. Reports after the falls he began having back pain. He had been taking oxycodone for pain without relief. He saw his PCP 2 days ago and they d/c the oxycodone and started him on Linzess and tramadol. Reports constipation x 5 days

## 2020-01-03 NOTE — ED Provider Notes (Signed)
Emergency Department Provider Note   I have reviewed the triage vital signs and the nursing notes.   HISTORY  Chief Complaint Back Pain   HPI Robert [name] is a 79 y.o. male presents to the ED with continued back pain in the setting of recent falls with known right gluteal hematoma on Coumadin with associated constipation. He has been to the ED twice this month after fall with low back and hip pain. He has had CT A/P and plain films with no fracture. The CT scan from the 8th shows a hematoma and patient notes continued bruising but no worsening swelling. He is relatively pain free when lying flat but has worsening pain with movement. He has followed with his PCP and Cardiology team this month as well. Blood work was ordered yesterday including INR which was 2.6 but no CBC has resulted. He has started seeing home health and has been taking Oxycodone but subsequently developed constipation. He has tried Miralax as well as stool softener and Linzess w/o relief. He denies any new falls. No fever or chills. No weakness/numbness in the groin or legs.   Past Medical History:  Diagnosis Date  . BPH (benign prostatic hyperplasia)   . Chronic anticoagulation    coumadin for mechanical valves  . CKD (chronic kidney disease), stage III ([place])   . Diabetes mellitus type 2, diet-controlled ([place])   . Gastric cancer ([place]) 2005   s/p gastrectomy  . High cholesterol   . HOH (hard of hearing)   . Hypertension   . Rheumatic heart disease   . S/P MVR (mitral valve replacement) 2015   82mm StJude mechanical valve  . Stroke [facility])     Patient Active Problem List   Diagnosis Date Noted  . Stroke ([place])   . Essential hypertension 02/06/2017  . Cerebrovascular accident (CVA) due to embolism of left middle cerebral artery ([place]) 12/01/2016  . Acute ischemic stroke ([place]) 12/01/2016  . Acute systolic congestive heart failure, NYHA class 3 ([place]) 04/18/2016  . DOE (dyspnea on exertion) 04/07/2016  .  Abnormal EKG 10/18/2015  . AKI (acute kidney injury) ([place])   . BPH (benign prostatic hyperplasia) 08/29/2015  . Elevated troponin 08/29/2015  . SBO (small bowel obstruction) ([place]) 08/29/2015  . Pancreatitis 08/29/2015  . Protein-calorie malnutrition, severe ([place]) 08/29/2015  . Hyperlipidemia 08/29/2015  . Noncompliance with medications 05/26/2015  . Zamyah Wiesman term (current) use of anticoagulants 05/18/2015  . Coronary artery disease involving native coronary artery of native heart without angina pectoris   . Chronic systolic (congestive) heart failure ([place]) 05/06/2015  . Hypertensive urgency 05/06/2015  . CKD (chronic kidney disease) stage 3, GFR 30-59 ml/min ([facility]) 05/06/2015  . Chronic anemia 05/06/2015  . H/O mitral valve replacement with mechanical valve 05/06/2015  . CHF (congestive heart failure) ([place]) 05/06/2015    Past Surgical History:  Procedure Laterality Date  . CARDIAC CATHETERIZATION N/A 05/10/2015   Procedure: Right/Left Heart Cath and Coronary Angiography;  Surgeon: Burnell Blanks, MD;  Location: [place] CV LAB;  Service: Cardiovascular;  Laterality: N/A;  . COLONOSCOPY  11/22/2017   [place]    . IR ANGIO INTRA EXTRACRAN SEL COM CAROTID INNOMINATE BILAT MOD SED  12/01/2016  . IR ANGIO VERTEBRAL SEL SUBCLAVIAN INNOMINATE UNI R MOD SED  12/01/2016  . MITRAL VALVE REPLACEMENT  10/24/2013   58mm StJude mechanical valve  . PARTIAL GASTRECTOMY  2005  . SMALL BOWEL ENTEROSCOPY  11/22/2017   [facility]  Allergies Penicillins and Vancomycin  Family History  Problem Relation Age of Onset  . Hypertension Mother   . Diabetes Mother   . Cancer Brother   . Colon cancer Neg Hx   . Esophageal cancer Neg Hx     Social History Social History   Tobacco Use  . Smoking status: Never Smoker  . Smokeless tobacco: Never Used  Substance Use Topics  . Alcohol use: No    Alcohol/week: 0.0 standard drinks  . Drug use: No    Review of  Systems  Constitutional: No fever/chills Eyes: No visual changes. ENT: No sore throat. Cardiovascular: Denies chest pain. Respiratory: Denies shortness of breath. Gastrointestinal: No abdominal pain.  No nausea, no vomiting.  No diarrhea. Positive constipation. Genitourinary: Negative for dysuria. Musculoskeletal: Positive for back pain. Skin: Negative for rash. Neurological: Negative for headaches, focal weakness or numbness.  10-point ROS otherwise negative.  ____________________________________________   PHYSICAL EXAM:  VITAL SIGNS: ED Triage Vitals  Enc Vitals Group     BP 01/03/20 1324 127/80     Pulse Rate 01/03/20 1324 67     Resp 01/03/20 1324 16     Temp 01/03/20 1324 97.6 F (36.4 C)     Temp Source 01/03/20 1324 Oral     SpO2 01/03/20 1324 100 %     Weight 01/03/20 1320 128 lb (58.1 kg)     Height 01/03/20 1320 5\' 6"  (1.676 m)   Constitutional: Alert and oriented. Well appearing and in no acute distress. Eyes: Conjunctivae are normal.  Head: Atraumatic. Nose: No congestion/rhinnorhea. Mouth/Throat: Mucous membranes are moist.   Neck: No stridor.  Cardiovascular: Normal rate, regular rhythm. Good peripheral circulation. Grossly normal heart sounds.   Respiratory: Normal respiratory effort.  No retractions. Lungs CTAB. Gastrointestinal: Soft and nontender. No distention. Rectal exam performed with nurse chaperone and patient's verbal consent. No impaction but hard stool just at the tip of the finger on DRE. No hemorrhoids or bleeding. No masses.  Musculoskeletal: No lower extremity tenderness nor edema. No gross deformities of extremities. Neurologic:  Normal speech and language. No gross focal neurologic deficits are appreciated.  Skin:  Skin is warm, dry and intact. Bruising over the lower back and right buttock appears old. No hard mass/hematoma.    ____________________________________________   LABS (all labs ordered are listed, but only abnormal results  are displayed)  Labs Reviewed  CBC WITH DIFFERENTIAL/PLATELET - Abnormal; Notable for the following components:      Result Value   RBC 2.57 (*)    Hemoglobin 8.4 (*)    HCT 26.9 (*)    MCV 104.7 (*)    RDW 21.8 (*)    Lymphs Abs 0.4 (*)    All other components within normal limits  PATHOLOGIST SMEAR REVIEW   ____________________________________________  RADIOLOGY  CT ABDOMEN PELVIS WO CONTRAST  Result Date: 01/03/2020 CLINICAL DATA:  Abdominal pain and back pain after fall. Known right gluteal hematoma EXAM: CT ABDOMEN AND PELVIS WITHOUT CONTRAST TECHNIQUE: Multidetector CT imaging of the abdomen and pelvis was performed following the standard protocol without IV contrast. COMPARISON:  CT 12/19/2019 FINDINGS: Lower chest: Trace bilateral pleural effusions, right greater than left. Stable cardiomegaly. Similar appearance of circumferential thickening of the distal esophagus. Hepatobiliary: Unremarkable unenhanced appearance of the liver. No focal liver lesion identified. Gallbladder within normal limits. No hyperdense gallstone. No biliary dilatation. Pancreas: Pancreatic atrophy without evidence of focal abnormality or inflammation. Spleen: Normal in size without focal abnormality. Adrenals/Urinary Tract: Unremarkable adrenal glands.  Stable appearance of the bilateral kidneys. No renal stone or hydronephrosis. Right-sided bladder wall diverticulum an additional small left bladder wall diverticulum (series 2, image 66). Bladder is moderately distended. Stomach/Bowel: Stomach is within normal limits. Appendix appears normal (series 2, image 45). Scattered diverticulosis. Moderate to large volume of stool within the colon including inspissated appearing stool in the rectosigmoid colon. No evidence of bowel wall thickening, distention, or inflammatory changes. Vascular/Lymphatic: Aortic atherosclerosis. No enlarged abdominal or pelvic lymph nodes. Reproductive: Prostate is unremarkable. Other:  Supraumbilical fat containing hernias are again seen both containing a small amount of fluid, which has slightly increased within the larger more centrally located hernia sac (series 2, image 42). No bowel extends into either hernia. No ascites. No pneumoperitoneum. Musculoskeletal: Soft tissue hematoma inferior to the right gluteal musculature has slightly decreased in size from prior measuring approximately 3.4 x 1.7 cm (series 5, image 4), previously measured 4.7 x 2.8 cm. New L2 vertebral body compression fracture with approximately 20% vertebral body height loss (series 7, image 67). No significant bony retropulsion. No evidence of fracture extension into the posterior elements. Remaining osseous structures appear unchanged. IMPRESSION: 1. New L2 vertebral body compression fracture with approximately 20% vertebral body height loss. No significant bony retropulsion. 2. Soft tissue hematoma inferior to the right gluteal musculature has slightly decreased in size from prior study. 3. Trace bilateral pleural effusions, right greater than left. 4. Moderate to large volume of stool within the colon including inspissated appearing stool in the rectosigmoid colon. Correlate for constipation. 5. Similar appearance of circumferential thickening of the distal esophagus, which may reflect esophagitis. 6. Supraumbilical fat containing hernias both containing a small amount of fluid, which has slightly increased within the larger more centrally located hernia sac. No bowel extends into either hernia. 7. Aortic atherosclerosis. (ICD10-I70.0). Electronically Signed   By: Davina Poke D.O.   On: 01/03/2020 15:41    ____________________________________________   PROCEDURES  Procedure(s) performed:   Procedures  None  ____________________________________________   INITIAL IMPRESSION / ASSESSMENT AND PLAN / ED COURSE  Pertinent labs & imaging results that were available during my care of the patient were  reviewed by me and considered in my medical decision making (see chart for details).   Patient presents to the emergency department for evaluation of back/buttock pain in the setting of falls and developing constipation. No fever. No abdominal tenderness. No fecal impaction on exam but suspect that constipation may be contributing to his discomfort. Plan for enema here and reassess. Refilled Linzess and other constipation as well as topical pain medications. PCP has switched patient to Tramadol for pain and he is getting home health/rehab evaluation currently. No red flag signs/symptoms to prompt emergent MRI of the spine. CT imaging for 10/8 and plain films from the following week were reviewed with no fracture. No new falls since. Labs from yesterday reviewed showing continued CKD and INR of 2.6. Will not repeat this but have added CBC here today.   CBC with down-trending Hb. No gross blood or melena on exam. CT ordered to f/u on hematoma size as a possible source of anemia. Care transferred to Dr. Karle Starch.  ____________________________________________  FINAL CLINICAL IMPRESSION(S) / ED DIAGNOSES  Final diagnoses:  Acute bilateral low back pain without sciatica  Drug-induced constipation  Anemia, unspecified type  Closed compression fracture of L2 vertebra, initial encounter ([place])     MEDICATIONS GIVEN DURING THIS VISIT:  Medications  sodium phosphate (FLEET) 7-19 GM/118ML enema 1 enema (1 enema  Rectal Given 01/03/20 1443)  lidocaine (XYLOCAINE) 2 % viscous mouth solution 15 mL (15 mLs Mouth/Throat Given 01/03/20 1633)     NEW OUTPATIENT MEDICATIONS STARTED DURING THIS VISIT:  Discharge Medication List as of 01/03/2020  6:33 PM    START taking these medications   Details  diclofenac Sodium (VOLTAREN) 1 % GEL Apply 2 g topically 4 (four) times daily as needed., Starting Sat 01/03/2020, Normal    !! linaclotide (LINZESS) 290 MCG CAPS capsule Take 1 capsule (290 mcg total) by mouth  daily before breakfast., Starting Sat 01/03/2020, Normal    senna-docusate (SENOKOT-S) 8.6-50 MG tablet Take 1 tablet by mouth at bedtime as needed for mild constipation or moderate constipation., Starting Sat 01/03/2020, Normal     !! - Potential duplicate medications found. Please discuss with provider.      Note:  This document was prepared using Dragon voice recognition software and may include unintentional dictation errors.  Nanda Quinton, MD, [facility] Emergency Medicine    Kara Melching, [facility], MD 01/04/20 [phone]

## 2020-01-03 NOTE — ED Provider Notes (Signed)
Care of the patient assumed at the change of shift. Recently had a fall, has a known buttock hematoma and has been very constipated from pain medications. HGB has dropped some from previous. Awaiting CT scan Physical Exam  BP 127/80 (BP Location: Right Arm)   Pulse 67   Temp 97.6 F (36.4 C) (Oral)   Resp 16   Ht 5\' 6"  (1.676 m)   Wt 58.1 kg   SpO2 100%   BMI 20.66 kg/m   Physical Exam  Patient in pain following enema.  ED Course/Procedures     Procedures  MDM  Patient with worsening rectal pain following enema. Also continues to have back pain. Discussed CT findings with patient and wife at bedside, including constipation, improving hematoma and new finding of compression fracture. Will give viscous lidocaine in rectum for pain. If able to control his symptoms may be able to go home pending outpatient kyphoplasty. I spoke with Dr. Vernard Gambles, IR, who recommends the patient be off coumadin for 5 days prior.   5:01 PM No improvement in rectal pain after lidocaine. Recheck rectal exam, large hard stool ball at rectal vault. He was disimpacted and residual stool was broken into smaller pieces which he should be able to void.   6:30 PM Patient feeling better after passing additional stool. He and wife would like to go home. IR Referral for kyphoplasty, however patient is on Coumadin for an artificial valve so wife advised to discuss risk/benefits of coming off Coumadin with his cardiologist. Otherwise he is stable for discharge.      Truddie Hidden, MD 01/03/20 [phone]

## 2020-01-03 NOTE — ED Notes (Signed)
ED Provider at bedside. 

## 2020-01-05 ENCOUNTER — Other Ambulatory Visit: Payer: Self-pay | Admitting: *Deleted

## 2020-01-05 ENCOUNTER — Other Ambulatory Visit: Payer: Self-pay | Admitting: Interventional Radiology

## 2020-01-05 DIAGNOSIS — S32000S Wedge compression fracture of unspecified lumbar vertebra, sequela: Secondary | ICD-10-CM

## 2020-01-06 ENCOUNTER — Other Ambulatory Visit: Payer: Self-pay | Admitting: Internal Medicine

## 2020-01-06 DIAGNOSIS — Z7901 Long term (current) use of anticoagulants: Secondary | ICD-10-CM

## 2020-01-06 DIAGNOSIS — Z952 Presence of prosthetic heart valve: Secondary | ICD-10-CM

## 2020-01-06 LAB — PATHOLOGIST SMEAR REVIEW

## 2020-01-09 ENCOUNTER — Ambulatory Visit (INDEPENDENT_AMBULATORY_CARE_PROVIDER_SITE_OTHER): Payer: BC Managed Care – PPO | Admitting: Pharmacist Clinician (PhC)/ Clinical Pharmacy Specialist

## 2020-01-09 ENCOUNTER — Other Ambulatory Visit: Payer: Self-pay

## 2020-01-09 ENCOUNTER — Ambulatory Visit: Payer: BC Managed Care – PPO | Admitting: Gastroenterology

## 2020-01-09 ENCOUNTER — Encounter: Payer: Self-pay | Admitting: Gastroenterology

## 2020-01-09 VITALS — BP 132/76 | HR 82 | Ht 66.0 in | Wt 121.1 lb

## 2020-01-09 DIAGNOSIS — D649 Anemia, unspecified: Secondary | ICD-10-CM

## 2020-01-09 DIAGNOSIS — Z7901 Long term (current) use of anticoagulants: Secondary | ICD-10-CM | POA: Diagnosis not present

## 2020-01-09 DIAGNOSIS — K5900 Constipation, unspecified: Secondary | ICD-10-CM

## 2020-01-09 DIAGNOSIS — Z952 Presence of prosthetic heart valve: Secondary | ICD-10-CM

## 2020-01-09 LAB — POCT INR: INR: 2.8 (ref 2.0–3.0)

## 2020-01-09 MED ORDER — TRULANCE 3 MG PO TABS: 1.0000 | ORAL_TABLET | Freq: Every day | ORAL | 6 refills | Status: AC

## 2020-01-09 NOTE — Patient Instructions (Addendum)
If you are age 81 or older, your body mass index should be between 23-30. Your Body mass index is 19.55 kg/m. If this is out of the aforementioned range listed, please consider follow up with your Primary Care Provider.  If you are age 11 or younger, your body mass index should be between 19-25. Your Body mass index is 19.55 kg/m. If this is out of the aformentioned range listed, please consider follow up with your Primary Care Provider.   Take Miralax 17 grams 2 times a day until a large BM and then go to 17 grams daily  Take Trulance 1 tablet daily   Fleet enema-one tonight and tomorrow morning and if no response then repeat again  Minimize pain medications   Please call in 12 weeks to schedule follow up appointment

## 2020-01-09 NOTE — Progress Notes (Signed)
Chief Complaint: FU constipation  Referring Provider:  Bonnita Nasuti, MD      ASSESSMENT AND PLAN;   #1.  Constipation-exacerbated by pain meds after vertebral fracture d/t fall. CT AP showing significant stool in the colon including rectum without obstruction.  Failed Linzess.  #2. Anemia-multifactorial. 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  Also had large hematoma after fall.  #2. +FOBT, neg enteroscopy and colon 11/2017 at [facility]  #3.  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  Plan: - Fleets enema 1 tonight and 1 AM, if no response rpt x 2. - Miralax 17g po bid until a large BM, then QD - Trulance 3 mg po qd (samples given) - Small but frequent meals. - Minimize pain meds - Copy of CT Abdo/pelvis given to the patient.  I also reviewed the CT in detail with patient and patient's wife. - FU in 12 weeks. - Discussed extensively with the patient, patient's wife.   HPI:    Robert [name] is a 79 y.o. 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  Malignant gastric ulcer s/p B2 gastrectomy 2005, CVA, HTN, HLD with heme positive stools who had a recent fall. With decreasing hemoglobin from 12.8 (2018) to 8.3, s/p oral iron supplements to Hb 10.8 to 8.4 d/t gluteal hematoma.  No overt GI bleeding.  [place] 12/16/2019 with resultant large right gluteal hematoma with decreased hemoglobin as above, fracture of L2 vertebra requiring oxycodone.   Main complaint is that of significant constipation despite Linzess.  He denies having any significant abdominal bloating but does not feel good.  He has not been eating very well.   No melena or hematochezia.  Had undergone extensive GI evaluation as below  No nausea, vomiting, heartburn, regurgitation, odynophagia or dysphagia. No melena or hematochezia. No abdominal pain.  Does have decreased appetite.  He also has longstanding H/O vitamin B12 deficiency and currently takes oral B12 supplements.  He has not noticed any blood in his stool.  His brother is a Energy manager  Previous GI evaluation: at [facility] [place] enteroscopy by Dr. Aviva Signs 11/22/2017 to mid jejunum. -Neg screening colonoscopy 11/2017 except for mild right colonic diverticulosis. CT AP without contrast 08/2019 No colonic wall thickening or mass is evident on CT. Mildly thick-walled bladder with right anterolateral bladder diverticulum, correlate for chronic bladder outlet obstruction.  Most recent CT Abdo/pelvis without contrast 01/03/2020 -Abdo pain and back pain after fall. 1. New L2 vertebral body compression fracture with approximately 20% vertebral body height loss. No significant bony retropulsion. 2. Soft tissue hematoma inferior to the right gluteal musculature has slightly decreased in size from prior study. 3. Trace bilateral pleural effusions, right greater than left. 4. Moderate to large volume of stool within the colon including inspissated appearing stool in the rectosigmoid colon. Correlate for constipation. 5. Similar appearance of circumferential thickening of the distal esophagus, which may reflect esophagitis. 6. Supraumbilical fat containing hernias both containing a small amount of fluid, which has slightly increased within the larger more centrally located hernia sac. No bowel extends into either hernia. 7. Aortic atherosclerosis. (ICD10-I70.0).  Wt Readings from Last  3 Encounters:  01/09/20 121 lb 2 oz (54.9 kg)  01/03/20 128 lb (58.1 kg)  12/24/19 128 lb (58.1 kg)    Past Medical History:  Diagnosis Date  .  BPH (benign prostatic hyperplasia)   . Chronic anticoagulation    coumadin for mechanical valves  . CKD (chronic kidney disease), stage III ([place])   . Diabetes mellitus type 2, diet-controlled ([place])   . 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 High cholesterol   . HOH (hard of hearing)   . Hypertension   . Rheumatic heart disease   . S/P MVR (mitral valve replacement) 2015   42mm StJude mechanical valve  . Stroke [facility])     Past Surgical History:  Procedure Laterality Date  . CARDIAC CATHETERIZATION N/A 05/10/2015   Procedure: Right/Left Heart Cath and Coronary Angiography;  Surgeon: Burnell Blanks, MD;  Location: [facility];  Service: Cardiovascular;  Laterality: N/A;  . COLONOSCOPY  11/22/2017   [facility]    . IR ANGIO INTRA EXTRACRAN SEL COM CAROTID INNOMINATE BILAT MOD SED  12/01/2016  . IR ANGIO VERTEBRAL SEL SUBCLAVIAN INNOMINATE UNI R MOD SED  12/01/2016  . MITRAL VALVE REPLACEMENT  10/24/2013   60mm StJude mechanical valve  . PARTIAL GASTRECTOMY  2005  . SMALL BOWEL ENTEROSCOPY  11/22/2017   [facility]     Family History  Problem Relation Age of Onset  . Hypertension Mother   . Diabetes Mother   . Cancer Brother   . Colon cancer Neg Hx   . Esophageal cancer Neg Hx     Social History   Tobacco Use  . Smoking status: Never Smoker  . Smokeless tobacco: Never Used  Substance Use Topics  . Alcohol use: No    Alcohol/week: 0.0 standard drinks  . Drug use: No    Current Outpatient Medications  Medication Sig Dispense Refill  . amiodarone (PACERONE) 200 MG tablet Take 1 tablet (200 mg total) by mouth 2 (two) times daily for 14 days, THEN 1 tablet (200 mg total) daily. (Patient taking differently: Take 200 mg by mouth daily.) 135 tablet 3  . aspirin EC 81 MG tablet Take 81 mg by mouth daily.    Marland Kitchen atorvastatin (LIPITOR) 20 MG tablet TAKE 1 TABLET BY MOUTH EVERYDAY AT BEDTIME 90 tablet 1  . calcium carbonate (OSCAL) 1500 (600  Ca) MG TABS tablet Take 1,500 mg by mouth daily with breakfast.    . carvedilol (COREG) 6.25 MG tablet Take 2 tablets (12.5 mg total) by mouth every morning AND 1 tablet (6.25 mg total) every evening. 12.5 mg in the morning and 6.25mg  in the evening) (2 tablets in the Morning and 1 Tablet in the Evening). 90 tablet 3  . diclofenac Sodium (VOLTAREN) 1 % GEL Apply 2 g topically 4 (four) times daily as needed. 50 g 0  . ferrous sulfate 325 (65 FE) MG tablet Take 1 tablet (325 mg total) by mouth 2 (two) times daily with a meal. 30 tablet 3  . furosemide (LASIX) 20 MG tablet Take 2 tablets (40mg ) by mouth in the morning and 1 tablet (20mg ) in the afternoon 90 tablet 3  . hydrALAZINE (APRESOLINE) 25 MG tablet TAKE 1 TABLET BY MOUTH TWICE A DAY 180 tablet 3  . lidocaine (LIDODERM) 5 % Place 1 patch onto the skin daily. Remove & Discard patch within 12 hours or as directed by MD 15 patch 0  . Multiple Vitamin (  MULTIVITAMIN WITH MINERALS) TABS tablet Take 1 tablet by mouth daily.    Marland Kitchen senna-docusate (SENOKOT-S) 8.6-50 MG tablet Take 1 tablet by mouth at bedtime as needed for mild constipation or moderate constipation. 30 tablet 0  . tamsulosin (FLOMAX) 0.4 MG CAPS capsule Take 0.8 mg by mouth daily.    . traMADol (ULTRAM) 50 MG tablet Take 50 mg by mouth 3 (three) times daily.    . vitamin B-12 (CYANOCOBALAMIN) 1000 MCG tablet Take 1 tablet (1,000 mcg total) by mouth daily. 7 tablet 0  . warfarin (COUMADIN) 3 MG tablet TAKE 1 TO 1 & 1/2 TABLETS BY MOUTH DAILY AS DIRECTED BY COUMADIN CLINIC (Patient taking differently: Take 1.5 mg by mouth daily. TAKE 1 TO 1 & 1/2 TABLETS BY MOUTH DAILY AS DIRECTED BY COUMADIN CLINIC) 120 tablet 0  . linaclotide (LINZESS) 290 MCG CAPS capsule Take 1 capsule (290 mcg total) by mouth daily before breakfast. (Patient not taking: Reported on 01/09/2020) 30 capsule 0   No current facility-administered medications for this visit.    Allergies  Allergen Reactions  . Penicillins  Nausea And Vomiting    08/23/2018 pt has recently tolerated Amoxicillin per pt and wife Has patient had a PCN reaction causing immediate rash, facial/tongue/throat swelling, SOB or lightheadedness with hypotension: No Has patient had a PCN reaction causing severe rash involving mucus membranes or skin necrosis: No Has patient had a PCN reaction that required hospitalization: Unknown Has patient had a PCN reaction occurring within the last 10 years: No If all of the above answers are "NO", then may proceed with Cephalospor  . Vancomycin Other (See Comments)    Kidney problems    Review of Systems:      Physical Exam:    BP 132/76   Pulse 82   Ht 5\' 6"  (1.676 m)   Wt 121 lb 2 oz (54.9 kg)   BMI 19.55 kg/m  Wt Readings from Last 3 Encounters:  01/09/20 121 lb 2 oz (54.9 kg)  01/03/20 128 lb (58.1 kg)  12/24/19 128 lb (58.1 kg)   Constitutional: Mild cachexia, in no acute distress. Psychiatric: Normal mood and affect. Behavior is normal. Pulmonary/chest: Effort normal and breath sounds normal. No wheezing, rales or rhonchi. Abdominal: Soft, nondistended. Nontender. Bowel sounds active throughout. There are no masses palpable. No hepatomegaly. Rectal:  defered Neurological: Alert and oriented to person place and time. Skin: Skin is warm and dry. No rashes noted.  Data Reviewed: I have personally reviewed following labs and imaging studies  CBC: CBC Latest Ref Rng & Units 01/03/2020 12/19/2019 12/15/2019  WBC 4.0 - 10.5 K/uL 6.0 7.1 6.3  Hemoglobin 13.0 - 17.0 g/dL 8.4(L) 10.0(L) 10.1(L)  Hematocrit 39 - 52 % 26.9(L) 31.8(L) 31.0(L)  Platelets 150 - 400 K/uL 194 177 166    CMP: CMP Latest Ref Rng & Units 01/02/2020 12/19/2019 12/15/2019  Glucose 65 - 99 mg/dL 170(H) 115(H) 119(H)  BUN 8 - 27 mg/dL 43(H) 32(H) 27  Creatinine 0.76 - 1.27 mg/dL 2.64(H) 2.36(H) 2.35(H)  Sodium 134 - 144 mmol/L 142 141 142  Potassium 3.5 - 5.2 mmol/L 4.2 4.9 4.5  Chloride 96 - 106 mmol/L 103 102  105  CO2 20 - 29 mmol/L 27 29 25   Calcium 8.6 - 10.2 mg/dL 8.3(L) 8.2(L) 8.4(L)  Total Protein 6.0 - 8.5 g/dL 6.4 6.6 6.3  Total Bilirubin 0.0 - 1.2 mg/dL 1.8(H) 1.4(H) 0.8  Alkaline Phos 44 - 121 IU/L 79 62 86  AST 0 - 40  IU/L 44(H) 43(H) 31  ALT 0 - 44 IU/L 25 39 36     Carmell Austria, MD 01/09/2020, 4:22 PM  Cc: Bonnita Nasuti, MD

## 2020-01-13 ENCOUNTER — Emergency Department (HOSPITAL_COMMUNITY): Payer: BC Managed Care – PPO

## 2020-01-13 ENCOUNTER — Encounter (HOSPITAL_COMMUNITY): Payer: Self-pay | Admitting: Internal Medicine

## 2020-01-13 ENCOUNTER — Inpatient Hospital Stay (HOSPITAL_COMMUNITY)
Admission: EM | Admit: 2020-01-13 | Discharge: 2020-02-11 | DRG: 543 | Disposition: E | Payer: BC Managed Care – PPO | Attending: Internal Medicine | Admitting: Internal Medicine

## 2020-01-13 ENCOUNTER — Other Ambulatory Visit: Payer: Self-pay | Admitting: *Deleted

## 2020-01-13 ENCOUNTER — Telehealth: Payer: Self-pay

## 2020-01-13 ENCOUNTER — Other Ambulatory Visit: Payer: Self-pay

## 2020-01-13 DIAGNOSIS — D631 Anemia in chronic kidney disease: Secondary | ICD-10-CM | POA: Diagnosis present

## 2020-01-13 DIAGNOSIS — N323 Diverticulum of bladder: Secondary | ICD-10-CM | POA: Diagnosis present

## 2020-01-13 DIAGNOSIS — Z952 Presence of prosthetic heart valve: Secondary | ICD-10-CM | POA: Diagnosis not present

## 2020-01-13 DIAGNOSIS — E875 Hyperkalemia: Secondary | ICD-10-CM | POA: Diagnosis not present

## 2020-01-13 DIAGNOSIS — Z809 Family history of malignant neoplasm, unspecified: Secondary | ICD-10-CM

## 2020-01-13 DIAGNOSIS — Z903 Acquired absence of stomach [part of]: Secondary | ICD-10-CM | POA: Diagnosis not present

## 2020-01-13 DIAGNOSIS — W08XXXA Fall from other furniture, initial encounter: Secondary | ICD-10-CM | POA: Diagnosis present

## 2020-01-13 DIAGNOSIS — I482 Chronic atrial fibrillation, unspecified: Secondary | ICD-10-CM

## 2020-01-13 DIAGNOSIS — K5909 Other constipation: Secondary | ICD-10-CM | POA: Diagnosis present

## 2020-01-13 DIAGNOSIS — Z978 Presence of other specified devices: Secondary | ICD-10-CM

## 2020-01-13 DIAGNOSIS — E785 Hyperlipidemia, unspecified: Secondary | ICD-10-CM | POA: Diagnosis present

## 2020-01-13 DIAGNOSIS — T45515A Adverse effect of anticoagulants, initial encounter: Secondary | ICD-10-CM | POA: Diagnosis present

## 2020-01-13 DIAGNOSIS — M79601 Pain in right arm: Secondary | ICD-10-CM | POA: Diagnosis present

## 2020-01-13 DIAGNOSIS — I130 Hypertensive heart and chronic kidney disease with heart failure and stage 1 through stage 4 chronic kidney disease, or unspecified chronic kidney disease: Secondary | ICD-10-CM | POA: Diagnosis present

## 2020-01-13 DIAGNOSIS — N138 Other obstructive and reflux uropathy: Secondary | ICD-10-CM | POA: Diagnosis not present

## 2020-01-13 DIAGNOSIS — D689 Coagulation defect, unspecified: Secondary | ICD-10-CM | POA: Diagnosis present

## 2020-01-13 DIAGNOSIS — M4856XA Collapsed vertebra, not elsewhere classified, lumbar region, initial encounter for fracture: Secondary | ICD-10-CM | POA: Diagnosis present

## 2020-01-13 DIAGNOSIS — N179 Acute kidney failure, unspecified: Secondary | ICD-10-CM | POA: Diagnosis present

## 2020-01-13 DIAGNOSIS — I469 Cardiac arrest, cause unspecified: Secondary | ICD-10-CM | POA: Diagnosis not present

## 2020-01-13 DIAGNOSIS — S300XXA Contusion of lower back and pelvis, initial encounter: Secondary | ICD-10-CM | POA: Diagnosis present

## 2020-01-13 DIAGNOSIS — E876 Hypokalemia: Secondary | ICD-10-CM | POA: Diagnosis present

## 2020-01-13 DIAGNOSIS — I4821 Permanent atrial fibrillation: Secondary | ICD-10-CM | POA: Diagnosis present

## 2020-01-13 DIAGNOSIS — I5022 Chronic systolic (congestive) heart failure: Secondary | ICD-10-CM | POA: Diagnosis present

## 2020-01-13 DIAGNOSIS — N1832 Chronic kidney disease, stage 3b: Secondary | ICD-10-CM | POA: Diagnosis present

## 2020-01-13 DIAGNOSIS — Z20822 Contact with and (suspected) exposure to covid-19: Secondary | ICD-10-CM | POA: Diagnosis present

## 2020-01-13 DIAGNOSIS — Z7901 Long term (current) use of anticoagulants: Secondary | ICD-10-CM | POA: Diagnosis not present

## 2020-01-13 DIAGNOSIS — I10 Essential (primary) hypertension: Secondary | ICD-10-CM | POA: Diagnosis not present

## 2020-01-13 DIAGNOSIS — Z881 Allergy status to other antibiotic agents status: Secondary | ICD-10-CM

## 2020-01-13 DIAGNOSIS — Z85028 Personal history of other malignant neoplasm of stomach: Secondary | ICD-10-CM

## 2020-01-13 DIAGNOSIS — R310 Gross hematuria: Secondary | ICD-10-CM | POA: Diagnosis not present

## 2020-01-13 DIAGNOSIS — G450 Vertebro-basilar artery syndrome: Secondary | ICD-10-CM | POA: Diagnosis not present

## 2020-01-13 DIAGNOSIS — N401 Enlarged prostate with lower urinary tract symptoms: Secondary | ICD-10-CM | POA: Diagnosis present

## 2020-01-13 DIAGNOSIS — Z79899 Other long term (current) drug therapy: Secondary | ICD-10-CM

## 2020-01-13 DIAGNOSIS — E1122 Type 2 diabetes mellitus with diabetic chronic kidney disease: Secondary | ICD-10-CM | POA: Diagnosis present

## 2020-01-13 DIAGNOSIS — R29890 Loss of height: Secondary | ICD-10-CM | POA: Diagnosis present

## 2020-01-13 DIAGNOSIS — M549 Dorsalgia, unspecified: Secondary | ICD-10-CM | POA: Diagnosis present

## 2020-01-13 DIAGNOSIS — R54 Age-related physical debility: Secondary | ICD-10-CM | POA: Diagnosis present

## 2020-01-13 DIAGNOSIS — R338 Other retention of urine: Secondary | ICD-10-CM | POA: Diagnosis present

## 2020-01-13 DIAGNOSIS — Z880 Allergy status to penicillin: Secondary | ICD-10-CM

## 2020-01-13 DIAGNOSIS — D62 Acute posthemorrhagic anemia: Secondary | ICD-10-CM | POA: Diagnosis not present

## 2020-01-13 DIAGNOSIS — S32020S Wedge compression fracture of second lumbar vertebra, sequela: Secondary | ICD-10-CM | POA: Diagnosis not present

## 2020-01-13 DIAGNOSIS — Z8673 Personal history of transient ischemic attack (TIA), and cerebral infarction without residual deficits: Secondary | ICD-10-CM | POA: Diagnosis not present

## 2020-01-13 DIAGNOSIS — R791 Abnormal coagulation profile: Secondary | ICD-10-CM | POA: Diagnosis not present

## 2020-01-13 DIAGNOSIS — S32000A Wedge compression fracture of unspecified lumbar vertebra, initial encounter for closed fracture: Secondary | ICD-10-CM

## 2020-01-13 DIAGNOSIS — E11649 Type 2 diabetes mellitus with hypoglycemia without coma: Secondary | ICD-10-CM | POA: Diagnosis not present

## 2020-01-13 DIAGNOSIS — T83098A Other mechanical complication of other indwelling urethral catheter, initial encounter: Secondary | ICD-10-CM | POA: Diagnosis not present

## 2020-01-13 DIAGNOSIS — N183 Chronic kidney disease, stage 3 unspecified: Secondary | ICD-10-CM | POA: Diagnosis present

## 2020-01-13 DIAGNOSIS — H9190 Unspecified hearing loss, unspecified ear: Secondary | ICD-10-CM | POA: Diagnosis present

## 2020-01-13 DIAGNOSIS — D539 Nutritional anemia, unspecified: Secondary | ICD-10-CM | POA: Diagnosis present

## 2020-01-13 DIAGNOSIS — Z8249 Family history of ischemic heart disease and other diseases of the circulatory system: Secondary | ICD-10-CM

## 2020-01-13 DIAGNOSIS — Z833 Family history of diabetes mellitus: Secondary | ICD-10-CM

## 2020-01-13 DIAGNOSIS — Z7982 Long term (current) use of aspirin: Secondary | ICD-10-CM

## 2020-01-13 LAB — COMPREHENSIVE METABOLIC PANEL
ALT: 21 U/L (ref 0–44)
AST: 37 U/L (ref 15–41)
Albumin: 3.5 g/dL (ref 3.5–5.0)
Alkaline Phosphatase: 68 U/L (ref 38–126)
Anion gap: 12 (ref 5–15)
BUN: 35 mg/dL — ABNORMAL HIGH (ref 8–23)
CO2: 29 mmol/L (ref 22–32)
Calcium: 8.7 mg/dL — ABNORMAL LOW (ref 8.9–10.3)
Chloride: 103 mmol/L (ref 98–111)
Creatinine, Ser: 2.88 mg/dL — ABNORMAL HIGH (ref 0.61–1.24)
GFR, Estimated: 21 mL/min — ABNORMAL LOW (ref >60)
Glucose, Bld: 81 mg/dL (ref 70–99)
Potassium: 3.3 mmol/L — ABNORMAL LOW (ref 3.5–5.1)
Sodium: 144 mmol/L (ref 135–145)
Total Bilirubin: 2.2 mg/dL — ABNORMAL HIGH (ref 0.3–1.2)
Total Protein: 7.2 g/dL (ref 6.5–8.1)

## 2020-01-13 LAB — CBC
HCT: 34 % — ABNORMAL LOW (ref 39.0–52.0)
Hemoglobin: 10.8 g/dL — ABNORMAL LOW (ref 13.0–17.0)
MCH: 33.6 pg (ref 26.0–34.0)
MCHC: 31.8 g/dL (ref 30.0–36.0)
MCV: 105.9 fL — ABNORMAL HIGH (ref 80.0–100.0)
Platelets: 198 10*3/uL (ref 150–400)
RBC: 3.21 MIL/uL — ABNORMAL LOW (ref 4.22–5.81)
RDW: 19.5 % — ABNORMAL HIGH (ref 11.5–15.5)
WBC: 6.5 10*3/uL (ref 4.0–10.5)
nRBC: 0 % (ref 0.0–0.2)

## 2020-01-13 LAB — RESPIRATORY PANEL BY RT PCR (FLU A&B, COVID)
Influenza A by PCR: NEGATIVE
Influenza B by PCR: NEGATIVE
SARS Coronavirus 2 by RT PCR: NEGATIVE

## 2020-01-13 LAB — PROTIME-INR
INR: 3.9 — ABNORMAL HIGH (ref 0.8–1.2)
Prothrombin Time: 37 seconds — ABNORMAL HIGH (ref 11.4–15.2)

## 2020-01-13 LAB — MAGNESIUM: Magnesium: 2.6 mg/dL — ABNORMAL HIGH (ref 1.7–2.4)

## 2020-01-13 MED ORDER — OXYCODONE HCL 5 MG PO TABS
5.0000 mg | ORAL_TABLET | Freq: Once | ORAL | Status: AC
  Administered 2020-01-13: 5 mg via ORAL
  Filled 2020-01-13: qty 1

## 2020-01-13 MED ORDER — POTASSIUM CHLORIDE CRYS ER 20 MEQ PO TBCR
20.0000 meq | EXTENDED_RELEASE_TABLET | Freq: Once | ORAL | Status: AC
  Administered 2020-01-13: 20 meq via ORAL
  Filled 2020-01-13: qty 1

## 2020-01-13 MED ORDER — CARVEDILOL 6.25 MG PO TABS
6.2500 mg | ORAL_TABLET | Freq: Two times a day (BID) | ORAL | Status: DC
  Administered 2020-01-13 – 2020-01-17 (×9): 6.25 mg via ORAL
  Administered 2020-01-18: 3.125 mg via ORAL
  Administered 2020-01-18 – 2020-01-19 (×3): 6.25 mg via ORAL
  Filled 2020-01-13 (×13): qty 2

## 2020-01-13 MED ORDER — FUROSEMIDE 40 MG PO TABS
20.0000 mg | ORAL_TABLET | Freq: Two times a day (BID) | ORAL | Status: DC
  Administered 2020-01-13 – 2020-01-14 (×2): 20 mg via ORAL
  Filled 2020-01-13 (×2): qty 1

## 2020-01-13 MED ORDER — VITAMIN B-12 1000 MCG PO TABS
1000.0000 ug | ORAL_TABLET | Freq: Every day | ORAL | Status: DC
  Administered 2020-01-14 – 2020-01-19 (×7): 1000 ug via ORAL
  Filled 2020-01-13 (×7): qty 1

## 2020-01-13 MED ORDER — SENNOSIDES-DOCUSATE SODIUM 8.6-50 MG PO TABS
1.0000 | ORAL_TABLET | Freq: Every evening | ORAL | Status: DC | PRN
  Administered 2020-01-14: 1 via ORAL
  Filled 2020-01-13: qty 1

## 2020-01-13 MED ORDER — ATORVASTATIN CALCIUM 10 MG PO TABS
20.0000 mg | ORAL_TABLET | Freq: Every day | ORAL | Status: DC
  Administered 2020-01-13 – 2020-01-14 (×2): 20 mg via ORAL
  Filled 2020-01-13 (×2): qty 2

## 2020-01-13 MED ORDER — PLECANATIDE 3 MG PO TABS
1.0000 | ORAL_TABLET | Freq: Every day | ORAL | Status: DC
  Administered 2020-01-16 – 2020-01-19 (×4): 1 via ORAL

## 2020-01-13 MED ORDER — WARFARIN - PHARMACIST DOSING INPATIENT: Freq: Every day | Status: DC

## 2020-01-13 MED ORDER — HYDRALAZINE HCL 25 MG PO TABS
25.0000 mg | ORAL_TABLET | Freq: Two times a day (BID) | ORAL | Status: DC
  Administered 2020-01-13 – 2020-01-19 (×12): 25 mg via ORAL
  Filled 2020-01-13 (×12): qty 1

## 2020-01-13 MED ORDER — TAMSULOSIN HCL 0.4 MG PO CAPS
0.8000 mg | ORAL_CAPSULE | Freq: Every day | ORAL | Status: DC
  Administered 2020-01-14 – 2020-01-18 (×6): 0.8 mg via ORAL
  Filled 2020-01-13 (×7): qty 2

## 2020-01-13 MED ORDER — AMIODARONE HCL 200 MG PO TABS
200.0000 mg | ORAL_TABLET | Freq: Every day | ORAL | Status: DC
  Administered 2020-01-13 – 2020-01-19 (×7): 200 mg via ORAL
  Filled 2020-01-13 (×7): qty 1

## 2020-01-13 MED ORDER — LIDOCAINE 5 % EX PTCH
1.0000 | MEDICATED_PATCH | CUTANEOUS | Status: DC
  Administered 2020-01-13 – 2020-01-18 (×6): 1 via TRANSDERMAL
  Filled 2020-01-13 (×7): qty 1

## 2020-01-13 MED ORDER — ASPIRIN EC 81 MG PO TBEC
81.0000 mg | DELAYED_RELEASE_TABLET | Freq: Every day | ORAL | Status: DC
  Administered 2020-01-13 – 2020-01-19 (×7): 81 mg via ORAL
  Filled 2020-01-13 (×7): qty 1

## 2020-01-13 NOTE — H&P (Signed)
History and Physical        Hospital Admission Note Date: 01/18/2020  Patient name: Robert [name] Medical record number: [id number] Date of birth: March 09, 1941 Age: 79 y.o. Gender: male  PCP: Bonnita Nasuti, MD  Patient coming from: home Lives with: wife At baseline, ambulates: independently   Chief Complaint    Chief Complaint  Patient presents with  . 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  Patient recently stood up from the couch and became unbalanced and fell about 15 days ago and fell onto his back. No LOC or head injury. He was evaluated here in the ED and was diagnosed with an L2 compression fracture with 20% vertebral height loss and right gluteal hematoma.  He was discharged from the ED but has not been responding to pain medications at home and has had worsening pain over the past 24 hours. Denies any urinary incontinence or retention and denies any worsened constipation from baseline or fecal incontinence. No focal weakness or neurologic complaints.  ED Course: Afebrile, tachycardic and hypertensive on room air.  Notable labs: K3.3, BUN 35, creatinine 2.88 (creatinine was 1.78 in July and has been increasing), Hb 10.8, MCV 106 which has been increasing, INR 3.9.  CT L-spine: 35% loss of height and mild retropulsion of the posterior superior endplate. CT abdomen pelvis:  Increased bilateral perinephric inflammatory stranding suspicious for acute renal failure with distended bladder and bladder diverticula, bilateral groundglass nodules in both lungs likely infectious or inflammatory, aortic atherosclerosis and negative for retroperitoneal hematoma.  He was given oxycodone 5 mg x 1.  Vitals:   02/09/2020 1621 02/08/2020 1649  BP: (!) 137/91 (!) 159/96  Pulse: 84 93  Resp: 15 16  Temp:  97.8 F (36.6 C)  SpO2: 98% 96%     Review of Systems:  Review of Systems  Respiratory: Negative for shortness of breath and wheezing.   Cardiovascular: Positive for leg swelling. Negative for chest pain and palpitations.  Gastrointestinal: Negative for nausea and vomiting.  Genitourinary: Negative.   Musculoskeletal: Positive for back pain and falls.  Neurological: Negative for focal weakness, loss of consciousness and weakness.  All other systems reviewed and are negative.   Medical/Social/Family History   Past Medical History: Past Medical History:  Diagnosis Date  . BPH (benign prostatic hyperplasia)   . Chronic anticoagulation    coumadin for mechanical valves  . CKD (chronic kidney disease), stage III ([place])   . Diabetes mellitus type 2, diet-controlled ([place])   . Gastric cancer ([place]) 2005   s/p gastrectomy  . High cholesterol   . HOH (hard of hearing)   . Hypertension   . Rheumatic heart disease   . S/P MVR (mitral valve replacement)  2015   10mm StJude mechanical valve  . Stroke [facility])     Past Surgical History:  Procedure Laterality Date  . CARDIAC CATHETERIZATION N/A 05/10/2015   Procedure: Right/Left Heart Cath and Coronary Angiography;  Surgeon: Burnell Blanks, MD;  Location: [facility];  Service: Cardiovascular;  Laterality: N/A;  . COLONOSCOPY  11/22/2017   [place]    . IR ANGIO INTRA EXTRACRAN SEL COM CAROTID INNOMINATE BILAT MOD SED  12/01/2016  . IR ANGIO VERTEBRAL SEL SUBCLAVIAN INNOMINATE UNI R MOD SED   12/01/2016  . MITRAL VALVE REPLACEMENT  10/24/2013   68mm StJude mechanical valve  . PARTIAL GASTRECTOMY  2005  . SMALL BOWEL ENTEROSCOPY  11/22/2017   [facility]     Medications: Prior to Admission medications   Medication Sig Start Date End Date Taking? Authorizing Provider  amiodarone (PACERONE) 200 MG tablet Take 1 tablet (200 mg total) by mouth 2 (two) times daily for 14 days, THEN 1 tablet (200 mg total) daily. Patient taking differently: Take 200 mg by mouth daily. 10/30/19 11/12/20  Pixie Casino, MD  aspirin EC 81 MG tablet Take 81 mg by mouth daily.    [provider]  atorvastatin (LIPITOR) 20 MG tablet TAKE 1 TABLET BY MOUTH EVERYDAY AT BEDTIME 12/30/19   Hilty, Nadean Corwin, MD  calcium carbonate (OSCAL) 1500 (600 Ca) MG TABS tablet Take 1,500 mg by mouth daily with breakfast.    [provider]  carvedilol (COREG) 6.25 MG tablet Take 2 tablets (12.5 mg total) by mouth every morning AND 1 tablet (6.25 mg total) every evening. 12.5 mg in the morning and 6.25mg  in the evening) (2 tablets in the Morning and 1 Tablet in the Evening). 10/21/19   Deberah Pelton, NP  diclofenac Sodium (VOLTAREN) 1 % GEL Apply 2 g topically 4 (four) times daily as needed. 01/03/20   Long, Wonda Olds, MD  ferrous sulfate 325 (65 FE) MG tablet Take 1 tablet (325 mg total) by mouth 2 (two) times daily with a meal. 05/13/15   Regalado, Belkys A, MD  furosemide (LASIX) 20 MG tablet Take 2 tablets (40mg ) by mouth in the morning and 1 tablet (20mg ) in the afternoon 12/16/19   Hilty, Nadean Corwin, MD  hydrALAZINE (APRESOLINE) 25 MG tablet TAKE 1 TABLET BY MOUTH TWICE A DAY 12/25/19   Hilty, Nadean Corwin, MD  lidocaine (LIDODERM) 5 % Place 1 patch onto the skin daily. Remove & Discard patch within 12 hours or as directed by MD 12/19/19   Quintella Reichert, MD  Multiple Vitamin (MULTIVITAMIN WITH MINERALS) TABS tablet Take 1 tablet by mouth daily.    [provider]  Plecanatide (TRULANCE) 3 MG TABS Take  1 tablet by mouth daily. 01/09/20   Jackquline Denmark, MD  senna-docusate (SENOKOT-S) 8.6-50 MG tablet Take 1 tablet by mouth at bedtime as needed for mild constipation or moderate constipation. 01/03/20   Long, Wonda Olds, MD  tamsulosin (FLOMAX) 0.4 MG CAPS capsule Take 0.8 mg by mouth daily.    [provider]  traMADol (ULTRAM) 50 MG tablet Take 50 mg by mouth 3 (three) times daily.    [provider]  vitamin B-12 (CYANOCOBALAMIN) 1000 MCG tablet Take 1 tablet (1,000 mcg total) by mouth daily. 05/13/15   Regalado, Belkys A, MD  warfarin (COUMADIN) 3 MG tablet TAKE 1 TO 1 & 1/2 TABLETS BY MOUTH DAILY AS DIRECTED BY COUMADIN CLINIC Patient taking differently: Take 1.5 mg  by mouth daily. TAKE 1 TO 1 & 1/2 TABLETS BY MOUTH DAILY AS DIRECTED BY COUMADIN CLINIC 01/06/20   Hilty, Nadean Corwin, MD    Allergies:   Allergies  Allergen Reactions  . Penicillins Nausea And Vomiting    08/23/2018 pt has recently tolerated Amoxicillin per pt and wife Has patient had a PCN reaction causing immediate rash, facial/tongue/throat swelling, SOB or lightheadedness with hypotension: No Has patient had a PCN reaction causing severe rash involving mucus membranes or skin necrosis: No Has patient had a PCN reaction that required hospitalization: Unknown Has patient had a PCN reaction occurring within the last 10 years: No If all of the above answers are "NO", then may proceed with Cephalospor  . Vancomycin Other (See Comments)    Kidney problems    Social History:  reports that he has never smoked. He has never used smokeless tobacco. He reports that he does not drink alcohol and does not use drugs.  Family History: Family History  Problem Relation Age of Onset  . Hypertension Mother   . Diabetes Mother   . Cancer Brother   . Colon cancer Neg Hx   . Esophageal cancer Neg Hx      Objective   Physical Exam: Blood pressure (!) 159/96, pulse 93, temperature 97.8 F (36.6 C), temperature source  Oral, resp. rate 16, SpO2 96 %.  Physical Exam Vitals and nursing note reviewed.  Constitutional:      Appearance: Normal appearance.  HENT:     Head: Normocephalic and atraumatic.  Eyes:     Conjunctiva/sclera: Conjunctivae normal.  Cardiovascular:     Rate and Rhythm: Normal rate. Rhythm irregular.     Heart sounds: Murmur heard.      Comments: 3/6 systolic murmur in mitral region Pulmonary:     Effort: Pulmonary effort is normal.     Breath sounds: Normal breath sounds.  Abdominal:     General: Abdomen is flat.     Palpations: Abdomen is soft.  Musculoskeletal:     Comments: Reproducible low back pain with raising bilateral legs Negative straight leg raise test 3+ bilateral lower extremity pitting edema Bruises on his right toes from his previous fall  Skin:    Coloration: Skin is not jaundiced or pale.  Neurological:     General: No focal deficit present.     Mental Status: He is alert. Mental status is at baseline.  Psychiatric:        Mood and Affect: Mood normal.        Behavior: Behavior normal.     LABS on Admission: I have personally reviewed all the labs and imaging below    Basic Metabolic Panel: Recent Labs  Lab 02/01/2020 1319 01/12/2020 1600  NA 144  --   K 3.3*  --   CL 103  --   CO2 29  --   GLUCOSE 81  --   BUN 35*  --   CREATININE 2.88*  --   CALCIUM 8.7*  --   MG  --  2.6*   Liver Function Tests: Recent Labs  Lab 01/23/2020 1319  AST 37  ALT 21  ALKPHOS 68  BILITOT 2.2*  PROT 7.2  ALBUMIN 3.5   No results for input(s): LIPASE, AMYLASE in the last 168 hours. No results for input(s): AMMONIA in the last 168 hours. CBC: Recent Labs  Lab 01/24/2020 1319  WBC 6.5  HGB 10.8*  HCT 34.0*  MCV 105.9*  PLT 198   Cardiac  Enzymes: No results for input(s): CKTOTAL, CKMB, CKMBINDEX, TROPONINI in the last 168 hours. BNP: Invalid input(s): POCBNP CBG: No results for input(s): GLUCAP in the last 168 hours.  Radiological Exams on  Admission:  CT ABDOMEN PELVIS WO CONTRAST  Result Date: 01/16/2020 CLINICAL DATA:  80 year old male with recent fall, worsening back pain, anticoagulated. Query retroperitoneal bleeding. EXAM: CT ABDOMEN AND PELVIS WITHOUT CONTRAST TECHNIQUE: Multidetector CT imaging of the abdomen and pelvis was performed following the standard protocol without IV contrast. COMPARISON:  Lumbar spine CT today reported separately. Noncontrast CT Abdomen and Pelvis 01/03/2020. FINDINGS: Lower chest: Stable cardiomegaly. No pericardial effusion. Prosthetic mitral valve. Calcified aortic atherosclerosis. Small/trace layering right pleural effusion is stable. But there are diffuse centrilobular ground-glass pulmonary nodules at both lung bases, new since June although not significantly changed from last month. Some associated pulmonary septal thickening. No consolidation. Hepatobiliary: Negative noncontrast liver. Small volume vicarious contrast excretion to the gallbladder which otherwise appears normal. Pancreas: Mostly atrophied.  Otherwise negative. Spleen: Trace perisplenic free fluid is stable with simple fluid density. Otherwise negative. Adrenals/Urinary Tract: Adrenal glands remain normal. Nonobstructed kidneys, but increased bilateral perinephric inflammatory stranding since last month. No perinephric hematoma. No nephrolithiasis. Ureters are decompressed. Similar distension of the urinary bladder today (estimated bladder volume 280 mL) with stable right greater than left bladder diverticula (the larger measuring 3.3 cm on the right series 2, image 68). No perivesical stranding. Stomach/Bowel: There is a trace amount of simple density free fluid in the both lower quadrants (coronal images 60 and 61) adjacent to normal appearing bowel loops. The appendix remains normal, and also terminates in the right lower quadrant area (containing some oral contrast on coronal image 74). Decreased but not resolved retained stool in  redundant large bowel since last month. No large bowel inflammation. Diverticulosis of the ascending colon and hepatic flexure. No dilated small bowel. Stable fat containing umbilical hernia. Stomach and duodenum are decompressed. No free air. No mesenteric stranding. Vascular/Lymphatic: Vascular patency is not evaluated in the absence of IV contrast. Aortoiliac calcified atherosclerosis. Normal caliber abdominal aorta. Reproductive: Negative. Other: No pelvic free fluid.  No retroperitoneal hematoma. Musculoskeletal: Lumbar Spine details reported separately today. No other acute osseous abnormality identified. IMPRESSION: 1. Negative for retroperitoneal hematoma. Trace simple appearing free fluid in both lower quadrants, not significantly changed from last month. Lumbar Spine details reported separately. 2. Increased bilateral perinephric inflammatory stranding since last month is nonspecific but suspicious for acute renal failure. Distended bladder (estimated volume 280 mL) with stable bladder diverticula. 3. Nonspecific centrilobular ground-glass nodules at both lung bases, new since June. Some superimposed septal thickening and stable trace pleural fluid. But the lung appearance would be atypical for pulmonary edema. More likely is infectious (RSV, adenovirus, mycoplasma) or inflammatory (respiratory bronchiolitis) etiology. 4. Aortic Atherosclerosis (ICD10-I70.0). Electronically Signed   By: Genevie Ann M.D.   On: 01/25/2020 14:17   CT L-SPINE NO CHARGE  Result Date: 01/21/2020 CLINICAL DATA:  79 year old male with worsening back pain. EXAM: CT LUMBAR SPINE WITHOUT CONTRAST TECHNIQUE: Technique: Multiplanar CT images of the lumbar spine were reconstructed from contemporary CT of the Abdomen and Pelvis. CONTRAST:  None COMPARISON:  CT Abdomen and Pelvis today are reported separately. [facility]  CT Abdomen and Pelvis 01/03/2020. FINDINGS: Segmentation: Normal. Alignment: Stable straightening  of lumbar lordosis. No spondylolisthesis. Vertebrae: Compression fracture of the L2 superior endplate with up to 42% loss of vertebral body height has not significantly changed since 01/03/2020. Mild retropulsion  of the posterosuperior endplate is stable with no significant spinal stenosis. The L2 pedicles and posterior elements remain intact. Underlying osteopenia. Other lumbar and visible lower thoracic vertebrae appear intact. Visible sacrum, SI joints, and pelvis appears stable and intact. Paraspinal and other soft tissues: Abdominal and pelvic viscera are reported separately today. Lumbar paraspinal soft tissues remain within normal limits. Disc levels: Minimal lumbar spine degeneration for age. No CT evidence of lumbar spinal stenosis. IMPRESSION: 1. No significant change in the subacute L2 superior endplate compression fracture since 01/03/2020. 35% loss of height and mild retropulsion of the posterosuperior endplate without associated spinal stenosis. 2. Osteopenia. No new osseous abnormality in the lumbar spine. Mild for age lumbar spine degeneration. 3. CT Abdomen and Pelvis today reported separately. Electronically Signed   By: Genevie Ann M.D.   On: 01/25/2020 13:55      EKG: Not done in the ED   A & P   Principal Problem:   Intractable back pain Active Problems:   Chronic systolic (congestive) heart failure (HCC)   CKD (chronic kidney disease) stage 3, GFR 30-59 ml/min (HCC)   H/O mitral valve replacement with mechanical valve   Hyperlipidemia   Essential hypertension   Closed compression fracture of L2 lumbar vertebra, sequela   Supratherapeutic INR   Atrial fibrillation, chronic ([place])   1. Intractable low back pain s/p fall with resultant L2 compression fracture a. Pain management with bowel regimen b. IR consulted, discussed with Dr. Earleen Newport, for possible kyphoplasty  2. Progressively worsening renal function in the setting of CKD 3b a. Creatinine 2.88, slowly worsening since  March when it was 1.8 b. Increased bilateral perinephric inflammatory stranding on CT suspicious for acute renal failure with distended bladder c. Check bladder scans every 4 hours and as needed straight cath. Consider Foley cath placement if he needs >1 straight cath d. Consider nephrology consult in a.m. if no improvement  3. Persistent valvular atrial fibrillation  History of rheumatic heart disease with severe mitral stenosis s/p MVR a. On Coumadin, amiodarone and carvedilol b. Holding Coumadin in the setting of supratherapeutic INR and possible IR intervention c. No need to reverse INR for now but may need heparin bridge prior to procedure d. Continue rate and rhythm control e. Telemetry  4. Supratherapeutic INR a. INR 3.9 b. Outpatient INR targets are between 3 and 3.5 due to his history of stroke per Dr. Lysbeth Penner recent note c. No need for reversal right now, will just hold his coumadin for today d. He may need a heparin bridge if he is going for a procedure  5. HFrEF, not in exacerbation a. Continue home medications b. Monitor daily weights and intake/output  6. Chronic constipation a. Follows with GI b. Continue home laxatives  7. Hypokalemia a. Replete  8. Macrocytic anemia a. Hb is at baseline but MCV is trending up b. Follow up in AM  9. Hypertension  Hyperlipidemia a. Continue home meds      DVT prophylaxis: SCD   Code Status: Prior  Diet: heart healthy Family Communication: Admission, patients condition and plan of care including tests being ordered have been discussed with the patient who indicates understanding and agrees with the plan and Code Status. Patient's wife was updated  Disposition Plan: The appropriate patient status for this patient is INPATIENT. Inpatient status is judged to be reasonable and necessary in order to provide the required intensity of service to ensure the patient's safety. The patient's presenting symptoms, physical exam findings,  and  initial radiographic and laboratory data in the context of their chronic comorbidities is felt to place them at high risk for further clinical deterioration. Furthermore, it is not anticipated that the patient will be medically stable for discharge from the hospital within 2 midnights of admission. The following factors support the patient status of inpatient.   " The patient's presenting symptoms include intractable back pain. " The worrisome physical exam findings include back pain. " The initial radiographic and laboratory data are worrisome because of renal function, worsened L2 fracture. " The chronic co-morbidities include see above (multiple).   * I certify that at the point of admission it is my clinical judgment that the patient will require inpatient hospital care spanning beyond 2 midnights from the point of admission due to high intensity of service, high risk for further deterioration and high frequency of surveillance required.*   The medical decision making on this patient was of high complexity and the patient is at high risk for clinical deterioration, therefore this is a level 3  admission.  Consultants  . IR  Procedures  . none  Time Spent on Admission: 75 minutes    Harold Hedge, DO [facility]  01/16/2020, 7:01 PM

## 2020-01-13 NOTE — ED Triage Notes (Signed)
Pt BIBA from home-  Per EMS- Pt c/o back pain that's worsening as of this AM, reports recent hx of back surgery due to spinal fracture.   AOx4, ambulatory with assistance.

## 2020-01-13 NOTE — ED Provider Notes (Signed)
[facility] Emergency Department Provider Note MRN:  [id number]  Arrival date & time: 02/08/2020     Chief Complaint   Back Pain   History of Present Illness   Robert [name] is a 79 y.o. year-old male with a history of diabetes, CKD presenting to the ED with chief complaint of back pain.  Patient slipped and fell off of the couch and fell onto his back 15 days ago.  Has been evaluated here in the emergency department and diagnosed with a compression fracture.  Was told that with rest he should be feeling better at this point but continues to have severe pain not responding to home pain medications.  Having trouble getting out of bed for the past few days due to the severe pain when trying to move or twist.  Denies dizziness or diaphoresis, no headache or vision change, no chest pain or shortness of breath, no abdominal pain, no numbness or weakness to the arms or legs, no bowel or bladder dysfunction.  Review of Systems  A complete 10 system review of systems was obtained and all systems are negative except as noted in the HPI and PMH.   Patient's Health History    Past Medical History:  Diagnosis Date  . BPH (benign prostatic hyperplasia)   . Chronic anticoagulation    coumadin for mechanical valves  . CKD (chronic kidney disease), stage III ([place])   . Diabetes mellitus type 2, diet-controlled ([place])   . Gastric cancer ([place]) 2005   s/p gastrectomy  . High cholesterol   . HOH (hard of hearing)   . Hypertension   . Rheumatic heart disease   . S/P MVR (mitral valve replacement) 2015   57mm StJude mechanical valve  . Stroke [facility])     Past Surgical History:  Procedure Laterality Date  . CARDIAC CATHETERIZATION N/A 05/10/2015   Procedure: Right/Left Heart Cath and Coronary Angiography;  Surgeon: Burnell Blanks, MD;  Location: [facility];  Service: Cardiovascular;  Laterality: N/A;  . COLONOSCOPY  11/22/2017   [facility]     . IR ANGIO INTRA EXTRACRAN SEL COM CAROTID INNOMINATE BILAT MOD SED  12/01/2016  . IR ANGIO VERTEBRAL SEL SUBCLAVIAN INNOMINATE UNI R MOD SED  12/01/2016  . MITRAL VALVE REPLACEMENT  10/24/2013   66mm StJude mechanical valve  . PARTIAL GASTRECTOMY  2005  . SMALL BOWEL ENTEROSCOPY  11/22/2017   [facility]     Family History  Problem Relation Age of Onset  . Hypertension Mother   . Diabetes Mother   . Cancer Brother   . Colon cancer Neg Hx   . Esophageal cancer Neg Hx     Social History   Socioeconomic History  . Marital status: Married    Spouse name: Not on file  . Number of children: Not on file  . Years of education: Not on file  . Highest education level: Not on file  Occupational History  . Occupation: Astronomer professor     Employer: [employer]  Tobacco Use  . Smoking status: Never Smoker  . Smokeless tobacco: Never Used  Substance and Sexual Activity  . Alcohol use: No    Alcohol/week: 0.0 standard drinks  . Drug use: No  . Sexual activity: Not Currently  Other Topics Concern  . Not on file  Social History Narrative   Lives in [place] with his wife.   Social Determinants of Radio broadcast assistant  Strain:   . Difficulty of Paying Living Expenses: Not on file  Food Insecurity:   . Worried About Charity fundraiser in the Last Year: Not on file  . Ran Out of Food in the Last Year: Not on file  Transportation Needs:   . Lack of Transportation (Medical): Not on file  . Lack of Transportation (Non-Medical): Not on file  Physical Activity:   . Days of Exercise per Week: Not on file  . Minutes of Exercise per Session: Not on file  Stress:   . Feeling of Stress : Not on file  Social Connections:   . Frequency of Communication with Friends and Family: Not on file  . Frequency of Social Gatherings with Friends and Family: Not on file  . Attends Religious Services: Not on file  . Active Member of Clubs or Organizations: Not on file  .  Attends Archivist Meetings: Not on file  . Marital Status: Not on file  Intimate Partner Violence:   . Fear of Current or Ex-Partner: Not on file  . Emotionally Abused: Not on file  . Physically Abused: Not on file  . Sexually Abused: Not on file     Physical Exam   Vitals:   01/12/2020 1139 02/10/2020 1400  BP:  (!) 154/94  Pulse:  74  Resp:  17  Temp:    SpO2: 99% 100%    CONSTITUTIONAL: Well-appearing, NAD NEURO:  Alert and oriented x 3, no focal deficits EYES:  eyes equal and reactive ENT/NECK:  no LAD, no JVD CARDIO: Regular rate, well-perfused, normal S1 and S2 PULM:  CTAB no wheezing or rhonchi GI/GU:  normal bowel sounds, non-distended, non-tender MSK/SPINE:  No gross deformities, no edema SKIN:  no rash, atraumatic PSYCH:  Appropriate speech and behavior  *Additional and/or pertinent findings included in MDM below  Diagnostic and Interventional Summary    EKG Interpretation  Date/Time:    Ventricular Rate:    PR Interval:    QRS Duration:   QT Interval:    QTC Calculation:   R Axis:     Text Interpretation:        Labs Reviewed  CBC - Abnormal; Notable for the following components:      Result Value   RBC 3.21 (*)    Hemoglobin 10.8 (*)    HCT 34.0 (*)    MCV 105.9 (*)    RDW 19.5 (*)    All other components within normal limits  COMPREHENSIVE METABOLIC PANEL - Abnormal; Notable for the following components:   Potassium 3.3 (*)    BUN 35 (*)    Creatinine, Ser 2.88 (*)    Calcium 8.7 (*)    Total Bilirubin 2.2 (*)    GFR, Estimated 21 (*)    All other components within normal limits  PROTIME-INR - Abnormal; Notable for the following components:   Prothrombin Time 37.0 (*)    INR 3.9 (*)    All other components within normal limits  RESPIRATORY PANEL BY RT PCR (FLU A&B, COVID)    CT L-SPINE NO CHARGE  Final Result    CT ABDOMEN PELVIS WO CONTRAST  Final Result      Medications  oxyCODONE (Oxy IR/ROXICODONE) immediate release  tablet 5 mg (has no administration in time range)     Procedures  /  Critical Care Procedures  ED Course and Medical Decision Making  I have reviewed the triage vital signs, the nursing notes, and pertinent available records from  the EMR.  Listed above are laboratory and imaging tests that I personally ordered, reviewed, and interpreted and then considered in my medical decision making (see below for details).  Persistent low back pain after fall, known L2 compression fracture but pain is worsening.  Will repeat image to evaluate for worsening fracture or retroperitoneal bleeding given that he was supratherapeutic during the fall.     Imaging reveals fairly stable appearance of compression fracture.  CT abdomen without bleeding but does note increased inflammatory changes around the kidneys suspicious for acute renal failure.  This may correlate with patient's rising creatinine.  History of prostate issues, seems to have a distended bladder on the CT scan and so considering an obstructive AKI.  Given these renal findings and his continued pain and inability to move or ambulate, will admit to medicine.  Barth Kirks. Sedonia Small, MD [place] [email or web address]  Final Clinical Impressions(s) / ED Diagnoses     ICD-10-CM   1. AKI (acute kidney injury) ([place])  N17.9   2. Back pain  M54.9 CT L-SPINE NO CHARGE    CT L-SPINE NO CHARGE    ED Discharge Orders    None       Discharge Instructions Discussed with and Provided to Patient:   Discharge Instructions   None       Maudie Flakes, MD 02/03/2020 1454

## 2020-01-13 NOTE — ED Notes (Signed)
Wife at bedside.

## 2020-01-13 NOTE — Telephone Encounter (Signed)
PA approved for [facility] daily from 01/12/2020 to 01/12/2023

## 2020-01-13 NOTE — Progress Notes (Signed)
ANTICOAGULATION CONSULT NOTE - Initial Consult  Pharmacy Consult for Warfarin Indication: history of mitral valve replacement  Allergies  Allergen Reactions  . Penicillins Nausea And Vomiting    08/23/2018 pt has recently tolerated Amoxicillin per pt and wife Has patient had a PCN reaction causing immediate rash, facial/tongue/throat swelling, SOB or lightheadedness with hypotension: No Has patient had a PCN reaction causing severe rash involving mucus membranes or skin necrosis: No Has patient had a PCN reaction that required hospitalization: Unknown Has patient had a PCN reaction occurring within the last 10 years: No If all of the above answers are "NO", then may proceed with Cephalospor  . Vancomycin Other (See Comments)    Kidney problems    Patient Measurements: Height: 5\' 6"  Weight: 54.9 kg  Vital Signs: Temp: 97.8 F (36.6 C) (11/02 1649) Temp Source: Oral (11/02 1649) BP: 159/96 (11/02 1649) Pulse Rate: 93 (11/02 1649)  Labs: Recent Labs    01/21/2020 1319 01/31/2020 1346  HGB 10.8*  --   HCT 34.0*  --   PLT 198  --   LABPROT  --  37.0*  INR  --  3.9*  CREATININE 2.88*  --     Estimated Creatinine Clearance: 15.9 mL/min (A) (by C-G formula based on SCr of 2.88 mg/dL (H)).   Medical History: Past Medical History:  Diagnosis Date  . BPH (benign prostatic hyperplasia)   . Chronic anticoagulation    coumadin for mechanical valves  . CKD (chronic kidney disease), stage III ([place])   . Diabetes mellitus type 2, diet-controlled ([place])   . Gastric cancer ([place]) 2005   s/p gastrectomy  . High cholesterol   . HOH (hard of hearing)   . Hypertension   . Rheumatic heart disease   . S/P MVR (mitral valve replacement) 2015   82mm StJude mechanical valve  . Stroke [facility])     Medications:  PTA Warfarin 1.5mg  PO daily-last dose 01/12/2020 at 2200  Assessment: 50 y/oM with PMH of rheumatic heart disease s/p mitral valve replacement, stroke on chronic warfarin  anticoagulation presented to [facility] ED on 01/21/2020 with worsening back pain after a recent fall. CT abdomen pelvis negative for retroperitoneal hematoma. Pharmacy consulted for warfarin dosing while patient admitted to the hospital. CBC: Hgb 10.8, Pltc 198K. INR supratherapeutic at 3.9.   Goal of Therapy:  INR 3-3.5 (per Anticoagulation Clinic notes) Monitor platelets by anticoagulation protocol: Yes   Plan:  Hold warfarin today Daily PT/INR Monitor closely for s/sx of bleeding   Lindell Spar, PharmD, BCPS Clinical Pharmacist  01/21/2020,6:34 PM

## 2020-01-14 DIAGNOSIS — M549 Dorsalgia, unspecified: Secondary | ICD-10-CM | POA: Diagnosis not present

## 2020-01-14 LAB — PROTIME-INR
INR: 4.1 (ref 0.8–1.2)
Prothrombin Time: 38.2 seconds — ABNORMAL HIGH (ref 11.4–15.2)

## 2020-01-14 MED ORDER — TRAMADOL HCL 50 MG PO TABS
50.0000 mg | ORAL_TABLET | Freq: Two times a day (BID) | ORAL | Status: DC | PRN
  Administered 2020-01-14 – 2020-01-15 (×2): 50 mg via ORAL
  Filled 2020-01-14 (×2): qty 1

## 2020-01-14 MED ORDER — OXYCODONE HCL 5 MG PO TABS
5.0000 mg | ORAL_TABLET | ORAL | Status: DC | PRN
  Administered 2020-01-15 – 2020-01-19 (×9): 10 mg via ORAL
  Filled 2020-01-14 (×9): qty 2

## 2020-01-14 MED ORDER — SENNOSIDES-DOCUSATE SODIUM 8.6-50 MG PO TABS
1.0000 | ORAL_TABLET | Freq: Two times a day (BID) | ORAL | Status: DC
  Administered 2020-01-14 – 2020-01-19 (×9): 1 via ORAL
  Filled 2020-01-14 (×9): qty 1

## 2020-01-14 MED ORDER — POLYETHYLENE GLYCOL 3350 17 G PO PACK
17.0000 g | PACK | Freq: Every day | ORAL | Status: DC | PRN
  Filled 2020-01-14 (×2): qty 1

## 2020-01-14 MED ORDER — MAGNESIUM CITRATE PO SOLN: 1.0000 | Freq: Every day | ORAL | Status: DC | PRN

## 2020-01-14 MED ORDER — ATORVASTATIN CALCIUM 10 MG PO TABS
20.0000 mg | ORAL_TABLET | Freq: Every day | ORAL | Status: DC
  Administered 2020-01-15 – 2020-01-18 (×4): 20 mg via ORAL
  Filled 2020-01-14 (×4): qty 1

## 2020-01-14 MED ORDER — BISACODYL 10 MG RE SUPP: 10.0000 mg | Freq: Every day | RECTAL | Status: DC | PRN

## 2020-01-14 MED ORDER — ACETAMINOPHEN 325 MG PO TABS: 650.0000 mg | ORAL_TABLET | Freq: Four times a day (QID) | ORAL | Status: DC | PRN

## 2020-01-14 NOTE — Progress Notes (Addendum)
KHIEM GARGIS  HCW:[id number] DOB: [birth year] DOA: 01/25/2020 PCP: Bonnita Nasuti, MD    Brief Narrative:  79 year old with a history of DM2, chronic voiding difficulty followed at [facility] urology, CVA, gastric cancer status post gastrectomy, rheumatic heart disease/severe mitral stenosis status post MVR 2015 on chronic warfarin, persistent atrial fibrillation, and systolic CHF with EF 17-61% who presented to the ED with worsening back pain after a recent mechanical fall.  At the time of his fall he was evaluated in the ED and found to have an L2 compression fracture.  He was able to be sent home but his pain has not been controlled with pain medications.  Significant Events:  11/2 admit via ED  Antimicrobials:  None  DVT prophylaxis: Warfarin  Subjective: Somnolent due to pain medications but presently comfortable in bed.  Denies chest pain nausea vomiting or abdominal pain.  Reports ongoing severe low back pain which is reasonably well controlled but requires consistent dosing of pain medications which lead to nausea, constipation, and somnolence.  Assessment & Plan:  Subacute L2 compression fracture status post mechanical fall -intractable low back pain IR to evaluate today for possible kyphoplasty  Acute kidney injury on CKD stage IIIb Baseline creatinine 1.19 May 2019 -creatinine 2.88 at presentation -CT noted increased bilateral perinephric inflammatory stranding and distended bladder -bladder scans on schedule to assure patient is not experiencing retention -monitor creatinine closely -suspect this is prerenal with poor oral intake in setting of narcotic use and uncontrolled pain -hydrate and follow trend  Persistent valvular atrial fibrillation Continue amiodarone and Coreg -holding Coumadin with supratherapeutic INR and possible need for procedure -establish therapeutic INR goal of 3-3.5 -allowed to drift down into therapeutic range at this time and then will transition  to heparin when drops below therapeutic range in anticipation of intervention early next week  Coumadin induced coagulopathy INR supratherapeutic -holding Coumadin for now -no acute bleeding therefore will not reverse for now but may require reversal depending upon timing of possible IR intervention  Chronic systolic CHF No evidence of an acute exacerbation  Chronic constipation Due to narcotic use and low back injury -significantly increased bowel regimen  Hypokalemia Likely a consequence of poor oral intake -supplement and follow -magnesium is not low  Macrocytic anemia Check anemia panel  HTN Blood pressure reasonably controlled at this time  HLD Continue usual home medication  DM2  Code Status: FULL CODE Family Communication: Spoke with wife at bedside Status is: Inpatient  Remains inpatient appropriate because:Ongoing active pain requiring inpatient pain management   Dispo: The patient is from: Home              Anticipated d/c is to: Home              Anticipated d/c date is: > 3 days              Patient currently is not medically stable to d/c.   Consultants:  IR  Objective: Blood pressure (!) 147/74, pulse 63, temperature 98 F (36.7 C), temperature source Oral, resp. rate 15, weight 54.9 kg, SpO2 100 %. No intake or output data in the 24 hours ending 01/14/20 0911 Filed Weights   01/14/20 0515  Weight: 54.9 kg    Examination: General: No acute respiratory distress Lungs: Clear to auscultation bilaterally without wheezes or crackles Cardiovascular: Regular rate and rhythm without murmur gallop or rub normal S1 and S2 Abdomen: Nontender, nondistended, soft, bowel sounds positive, no rebound, no ascites, no  appreciable mass Extremities: No significant cyanosis, clubbing, or edema bilateral lower extremities  CBC: Recent Labs  Lab 01/14/2020 1319  WBC 6.5  HGB 10.8*  HCT 34.0*  MCV 105.9*  PLT 329   Basic Metabolic Panel: Recent Labs  Lab  01/30/2020 1319 01/25/2020 1600  NA 144  --   K 3.3*  --   CL 103  --   CO2 29  --   GLUCOSE 81  --   BUN 35*  --   CREATININE 2.88*  --   CALCIUM 8.7*  --   MG  --  2.6*   GFR: Estimated Creatinine Clearance: 15.9 mL/min (A) (by C-G formula based on SCr of 2.88 mg/dL (H)).  Liver Function Tests: Recent Labs  Lab 02/03/2020 1319  AST 37  ALT 21  ALKPHOS 68  BILITOT 2.2*  PROT 7.2  ALBUMIN 3.5    Coagulation Profile: Recent Labs  Lab 01/09/20 0851 01/31/2020 1346 01/14/20 0412  INR 2.8 3.9* 4.1*    HbA1C: Hgb A1c MFr Bld  Date/Time Value Ref Range Status  12/02/2016 02:36 AM 6.0 (H) 4.8 - 5.6 % Final    Comment:    (NOTE) Pre diabetes:          5.7%-6.4% Diabetes:              >6.4% Glycemic control for   <7.0% adults with diabetes   08/29/2015 04:14 AM 6.3 (H) 4.8 - 5.6 % Final    Comment:    (NOTE)         Pre-diabetes: 5.7 - 6.4         Diabetes: >6.4         Glycemic control for adults with diabetes: <7.0     Recent Results (from the past 240 hour(s))  Respiratory Panel by RT PCR (Flu A&B, Covid) - Nasopharyngeal Swab     Status: None   Collection Time: 01/17/2020  3:08 PM   Specimen: Nasopharyngeal Swab  Result Value Ref Range Status   SARS Coronavirus 2 by RT PCR NEGATIVE NEGATIVE Final    Comment: (NOTE) SARS-CoV-2 target nucleic acids are NOT DETECTED.  The SARS-CoV-2 RNA is generally detectable in upper respiratoy specimens during the acute phase of infection. The lowest concentration of SARS-CoV-2 viral copies this assay can detect is 131 copies/mL. A negative result does not preclude SARS-Cov-2 infection and should not be used as the sole basis for treatment or other patient management decisions. A negative result may occur with  improper specimen collection/handling, submission of specimen other than nasopharyngeal swab, presence of viral mutation(s) within the areas targeted by this assay, and inadequate number of viral copies (<131  copies/mL). A negative result must be combined with clinical observations, patient history, and epidemiological information. The expected result is Negative.  Fact Sheet for Patients:  PinkCheek.be  Fact Sheet for Healthcare Providers:  GravelBags.it  This test is no t yet approved or cleared by the [place] FDA and  has been authorized for detection and/or diagnosis of SARS-CoV-2 by FDA under an Emergency Use Authorization (EUA). This EUA will remain  in effect (meaning this test can be used) for the duration of the COVID-19 declaration under Section 564(b)(1) of the Act, 21 U.S.C. section 360bbb-3(b)(1), unless the authorization is terminated or revoked sooner.     Influenza A by PCR NEGATIVE NEGATIVE Final   Influenza B by PCR NEGATIVE NEGATIVE Final    Comment: (NOTE) The Xpert Xpress SARS-CoV-2/FLU/RSV assay is intended as an aid  in  the diagnosis of influenza from Nasopharyngeal swab specimens and  should not be used as a sole basis for treatment. Nasal washings and  aspirates are unacceptable for Xpert Xpress SARS-CoV-2/FLU/RSV  testing.  Fact Sheet for Patients: PinkCheek.be  Fact Sheet for Healthcare Providers: GravelBags.it  This test is not yet approved or cleared by the [place] FDA and  has been authorized for detection and/or diagnosis of SARS-CoV-2 by  FDA under an Emergency Use Authorization (EUA). This EUA will remain  in effect (meaning this test can be used) for the duration of the  Covid-19 declaration under Section 564(b)(1) of the Act, 21  U.S.C. section 360bbb-3(b)(1), unless the authorization is  terminated or revoked. Performed at [facility], [place] [address] [place], [place] [address]      Scheduled Meds: . amiodarone  200 mg Oral Daily  . aspirin EC  81 mg Oral Daily  . atorvastatin  20 mg Oral Daily   . carvedilol  6.25 mg Oral BID WC  . furosemide  20 mg Oral BID  . hydrALAZINE  25 mg Oral BID  . lidocaine  1 patch Transdermal Q24H  . Plecanatide  1 tablet Oral Daily  . tamsulosin  0.8 mg Oral Daily  . vitamin B-12  1,000 mcg Oral Daily  . Warfarin - Pharmacist Dosing Inpatient   Does not apply q1600     LOS: 1 day   Cherene Altes, MD [facility] Office  [phone] Pager - Text Page per Amion  If 7PM-7AM, please contact night-coverage per Amion 01/14/2020, 9:11 AM

## 2020-01-14 NOTE — Progress Notes (Signed)
ANTICOAGULATION CONSULT NOTE - Initial Consult  Pharmacy Consult for Warfarin Indication: history of mitral valve replacement  Allergies  Allergen Reactions  . Penicillins Nausea And Vomiting    08/23/2018 pt has recently tolerated Amoxicillin per pt and wife Has patient had a PCN reaction causing immediate rash, facial/tongue/throat swelling, SOB or lightheadedness with hypotension: No Has patient had a PCN reaction causing severe rash involving mucus membranes or skin necrosis: No Has patient had a PCN reaction that required hospitalization: Unknown Has patient had a PCN reaction occurring within the last 10 years: No If all of the above answers are "NO", then may proceed with Cephalospor  . Vancomycin Other (See Comments)    Kidney problems    Patient Measurements: Height: 5\' 6"  Weight: 54.9 kg  Vital Signs: Temp: 98 F (36.7 C) (11/03 0515) Temp Source: Oral (11/03 0515) BP: 147/74 (11/03 0515) Pulse Rate: 63 (11/03 0515)  Labs: Recent Labs    01/22/2020 1319 01/21/2020 1346 01/14/20 0412  HGB 10.8*  --   --   HCT 34.0*  --   --   PLT 198  --   --   LABPROT  --  37.0* 38.2*  INR  --  3.9* 4.1*  CREATININE 2.88*  --   --     Estimated Creatinine Clearance: 15.9 mL/min (A) (by C-G formula based on SCr of 2.88 mg/dL (H)).   Medical History: Past Medical History:  Diagnosis Date  . BPH (benign prostatic hyperplasia)   . Chronic anticoagulation    coumadin for mechanical valves  . CKD (chronic kidney disease), stage III ([place])   . Diabetes mellitus type 2, diet-controlled ([place])   . Gastric cancer ([place]) 2005   s/p gastrectomy  . High cholesterol   . HOH (hard of hearing)   . Hypertension   . Rheumatic heart disease   . S/P MVR (mitral valve replacement) 2015   76mm StJude mechanical valve  . Stroke [facility])     Medications: Warfarin PTA for mechanical mitral valve Home dose: Warfarin 1.5 mg PO daily Last dose PTA: 01/12/2020 at 2200  Assessment: 57 y/oM with  PMH of rheumatic heart disease s/p mitral valve replacement, stroke on chronic warfarin anticoagulation presented to [facility] ED on 01/14/2020 with worsening back pain after a recent fall. CT abdomen pelvis negative for retroperitoneal hematoma. Pharmacy consulted for warfarin dosing while patient admitted to the hospital.   Baseline labs:   CBC: Hgb 10.8, Pltc 198K. INR supratherapeutic at 3.9.   Today, 01/14/20  INR 4.1 is slightly supratherapeutic today  No significant DDI. Pt on amiodarone + warfarin PTA  H&P mentions possibility of invasive procedures including kyphoplasty.  Goal of Therapy:  INR 3-3.5 (per Anticoagulation Clinic notes) Monitor platelets by anticoagulation protocol: Yes   Plan:   Will hold warfarin again today given elevated INR and potential for invasive procedures.   Given history of mechanical valve and CVA, recommend anticoagulation bridge if INR becomes subtherapeutic (INR < 3)    Daily INR  Lenis Noon, PharmD 01/14/20 10:28 AM

## 2020-01-14 NOTE — TOC Initial Note (Addendum)
Transition of Care [facility]) - Initial/Assessment Note    Patient Details  Name: Robert [name] MRN: [id number] Date of Birth: 10-22-1940  [facility] CM/SW Contact:    Trish Mage, LCSW Phone Number: 01/14/2020, 2:18 PM  Clinical Narrative:  Patient seen in follow up to consult re: home needs.  Wife was at bedside and patient gave me permission to include her in the conversation.  Because of on-going debilitating pain, patient is in need of DME rollator, 3 in 1 and hospital bed.  Orders from MD seen and appreciated.  Contacted Zach with [employer] who will follow up to make sure insurance will cover these needs.  MD has also ordered PT consult to assess extent of patient's needs.  Wife states he was seen by [facility] agency [facility], but was in too much pain to benefit.  They are not able to tell me name of agency. Patient's plan is to return home with wife at d/c. TOC will continue to follow during the course of hospitalization.  Addendum: Patient is open to [facility] services.  Wife is wanting in-home services at night.  I explained that insurance will not cover that, and gave her information on some home care agencies and encouraged her to call.                  Expected Discharge Plan: [place] Barriers to Discharge: No Barriers Identified   Patient Goals and CMS Choice     Choice offered to / list presented to : Patient, Spouse  Expected Discharge Plan and Services Expected Discharge Plan: [place] Acute Care Choice: Durable Medical Equipment Living arrangements for the past 2 months: [place]                 DME Arranged: 3-N-1, Hospital bed, Walker rolling with seat                    Prior Living Arrangements/Services Living arrangements for the past 2 months: Single Family Home Lives with:: Spouse Patient language and need for interpreter reviewed:: Yes Do you feel safe going back to the place where  you live?: Yes      Need for Family Participation in Patient Care: Yes (Comment) Care giver support system in place?: Yes (comment) Current home services: DME, Home PT Criminal Activity/Legal Involvement Pertinent to Current Situation/Hospitalization: No - Comment as needed  Activities of Daily Living Home Assistive Devices/Equipment: Cane (specify quad or straight), Eyeglasses ADL Screening (condition at time of admission) Patient's cognitive ability adequate to safely complete daily activities?: Yes Is the patient deaf or have difficulty hearing?: No Does the patient have difficulty seeing, even when wearing glasses/contacts?: No Does the patient have difficulty concentrating, remembering, or making decisions?: No Patient able to express need for assistance with ADLs?: Yes Does the patient have difficulty dressing or bathing?: Yes Independently performs ADLs?: No Communication: Independent Dressing (OT): Needs assistance Is this a change from baseline?: Change from baseline, expected to last >3 days Grooming: Independent Feeding: Independent Bathing: Needs assistance Is this a change from baseline?: Change from baseline, expected to last >3 days Toileting: Needs assistance Is this a change from baseline?: Change from baseline, expected to last >3days In/Out Bed: Needs assistance Is this a change from baseline?: Change from baseline, expected to last >3 days Walks in Home: Needs assistance Is this a change from baseline?: Change from  baseline, expected to last >3 days Does the patient have difficulty walking or climbing stairs?: Yes Weakness of Legs: Both Weakness of Arms/Hands: Both  Permission Sought/Granted Permission sought to share information with : Family Supports Permission granted to share information with : Yes, Verbal Permission Granted  Share Information with NAME: Carbone,Pragna U (Spouse) [phone]           Emotional Assessment Appearance:: Appears stated  age Attitude/Demeanor/Rapport: Engaged Affect (typically observed): Appropriate Orientation: : Oriented to Self, Oriented to Place, Oriented to Situation Alcohol / Substance Use: Not Applicable Psych Involvement: No (comment)  Admission diagnosis:  Back pain [M54.9] AKI (acute kidney injury) ([place]) [N17.9] Intractable back pain [M54.9] Patient Active Problem List   Diagnosis Date Noted  . Intractable back pain 01/12/2020  . Closed compression fracture of L2 lumbar vertebra, sequela 01/20/2020  . Supratherapeutic INR 01/20/2020  . Atrial fibrillation, chronic ([place]) 01/26/2020  . Stroke ([place])   . Essential hypertension 02/06/2017  . Cerebrovascular accident (CVA) due to embolism of left middle cerebral artery ([place]) 12/01/2016  . Acute ischemic stroke ([place]) 12/01/2016  . Acute systolic congestive heart failure, NYHA class 3 ([place]) 04/18/2016  . DOE (dyspnea on exertion) 04/07/2016  . Abnormal EKG 10/18/2015  . AKI (acute kidney injury) ([place])   . BPH (benign prostatic hyperplasia) 08/29/2015  . Elevated troponin 08/29/2015  . SBO (small bowel obstruction) ([place]) 08/29/2015  . Pancreatitis 08/29/2015  . Protein-calorie malnutrition, severe ([place]) 08/29/2015  . Hyperlipidemia 08/29/2015  . Noncompliance with medications 05/26/2015  . Long term (current) use of anticoagulants 05/18/2015  . Coronary artery disease involving native coronary artery of native heart without angina pectoris   . Chronic systolic (congestive) heart failure ([place]) 05/06/2015  . Hypertensive urgency 05/06/2015  . CKD (chronic kidney disease) stage 3, GFR 30-59 ml/min (HCC) 05/06/2015  . Chronic anemia 05/06/2015  . H/O mitral valve replacement with mechanical valve 05/06/2015  . CHF (congestive heart failure) ([place]) 05/06/2015   PCP:  Bonnita Nasuti, MD Pharmacy:   [id number] - [place], [place] [place] [address] Phone: [phone] Fax:  [phone]     Social Determinants of Health (SDOH) Interventions    Readmission Risk Interventions No flowsheet data found.

## 2020-01-14 NOTE — Clinical SW OB High Risk (Cosign Needed)
    Durable Medical Equipment  (From admission, onward)         Start     Ordered   01/14/20 1040  For home use only DME 3 n 1  Once        01/14/20 1039   01/14/20 1040  For home use only DME Hospital bed  Once       Question Answer Comment  Length of Need 12 Months   The above medical condition requires: Patient requires the ability to reposition frequently   Bed type Semi-electric      01/14/20 1039   01/14/20 1039  For home use only DME 4 wheeled rolling walker with seat  Once       Question:  Patient needs a walker to treat with the following condition  Answer:  Compression fracture of L5 vertebra, sequela   01/14/20 1039

## 2020-01-15 ENCOUNTER — Inpatient Hospital Stay (HOSPITAL_COMMUNITY): Payer: BC Managed Care – PPO

## 2020-01-15 DIAGNOSIS — M549 Dorsalgia, unspecified: Secondary | ICD-10-CM | POA: Diagnosis not present

## 2020-01-15 LAB — CBC
HCT: 31.3 % — ABNORMAL LOW (ref 39.0–52.0)
HCT: 31.3 % — ABNORMAL LOW (ref 39.0–52.0)
Hemoglobin: 10.1 g/dL — ABNORMAL LOW (ref 13.0–17.0)
Hemoglobin: 10.1 g/dL — ABNORMAL LOW (ref 13.0–17.0)
MCH: 34 pg (ref 26.0–34.0)
MCH: 33.2 pg (ref 26.0–34.0)
MCHC: 32.3 g/dL (ref 30.0–36.0)
MCHC: 32.3 g/dL (ref 30.0–36.0)
MCV: 105.4 fL — ABNORMAL HIGH (ref 80.0–100.0)
MCV: 103 fL — ABNORMAL HIGH (ref 80.0–100.0)
Platelets: 167 10*3/uL (ref 150–400)
Platelets: 170 10*3/uL (ref 150–400)
RBC: 2.97 MIL/uL — ABNORMAL LOW (ref 4.22–5.81)
RBC: 3.04 MIL/uL — ABNORMAL LOW (ref 4.22–5.81)
RDW: 19.2 % — ABNORMAL HIGH (ref 11.5–15.5)
RDW: 19 % — ABNORMAL HIGH (ref 11.5–15.5)
WBC: 5 10*3/uL (ref 4.0–10.5)
WBC: 5.4 10*3/uL (ref 4.0–10.5)
nRBC: 0 % (ref 0.0–0.2)
nRBC: 0 % (ref 0.0–0.2)

## 2020-01-15 LAB — VITAMIN B12: Vitamin B-12: 2375 pg/mL — ABNORMAL HIGH (ref 180–914)

## 2020-01-15 LAB — COMPREHENSIVE METABOLIC PANEL
ALT: 20 U/L (ref 0–44)
AST: 35 U/L (ref 15–41)
Albumin: 3 g/dL — ABNORMAL LOW (ref 3.5–5.0)
Alkaline Phosphatase: 62 U/L (ref 38–126)
Anion gap: 11 (ref 5–15)
BUN: 38 mg/dL — ABNORMAL HIGH (ref 8–23)
CO2: 28 mmol/L (ref 22–32)
Calcium: 8.2 mg/dL — ABNORMAL LOW (ref 8.9–10.3)
Chloride: 100 mmol/L (ref 98–111)
Creatinine, Ser: 2.95 mg/dL — ABNORMAL HIGH (ref 0.61–1.24)
GFR, Estimated: 21 mL/min — ABNORMAL LOW (ref >60)
Glucose, Bld: 91 mg/dL (ref 70–99)
Potassium: 2.9 mmol/L — ABNORMAL LOW (ref 3.5–5.1)
Sodium: 139 mmol/L (ref 135–145)
Total Bilirubin: 1.9 mg/dL — ABNORMAL HIGH (ref 0.3–1.2)
Total Protein: 6.4 g/dL — ABNORMAL LOW (ref 6.5–8.1)

## 2020-01-15 LAB — RETICULOCYTES
Immature Retic Fract: 9.6 % (ref 2.3–15.9)
RBC.: 2.98 MIL/uL — ABNORMAL LOW (ref 4.22–5.81)
Retic Count, Absolute: 51.9 10*3/uL (ref 19.0–186.0)
Retic Ct Pct: 1.7 % (ref 0.4–3.1)

## 2020-01-15 LAB — FERRITIN: Ferritin: 277 ng/mL (ref 24–336)

## 2020-01-15 LAB — IRON AND TIBC
Iron: 58 ug/dL (ref 45–182)
Saturation Ratios: 26 % (ref 17.9–39.5)
TIBC: 223 ug/dL — ABNORMAL LOW (ref 250–450)
UIBC: 165 ug/dL

## 2020-01-15 LAB — GLUCOSE, CAPILLARY
Glucose-Capillary: 73 mg/dL (ref 70–99)
Glucose-Capillary: 77 mg/dL (ref 70–99)

## 2020-01-15 LAB — HEMOGLOBIN A1C
Hgb A1c MFr Bld: 5.1 % (ref 4.8–5.6)
Mean Plasma Glucose: 99.67 mg/dL

## 2020-01-15 LAB — PROTIME-INR
INR: 4.7 (ref 0.8–1.2)
Prothrombin Time: 43 seconds — ABNORMAL HIGH (ref 11.4–15.2)

## 2020-01-15 LAB — FOLATE: Folate: 60.3 ng/mL (ref >5.9)

## 2020-01-15 MED ORDER — MORPHINE SULFATE (PF) 2 MG/ML IV SOLN: 2.0000 mg | INTRAVENOUS | Status: DC | PRN

## 2020-01-15 MED ORDER — POTASSIUM CHLORIDE CRYS ER 20 MEQ PO TBCR
20.0000 meq | EXTENDED_RELEASE_TABLET | Freq: Two times a day (BID) | ORAL | Status: DC
  Filled 2020-01-15: qty 1

## 2020-01-15 MED ORDER — ACETAMINOPHEN 325 MG PO TABS: 650.0000 mg | ORAL_TABLET | Freq: Four times a day (QID) | ORAL | Status: DC | PRN

## 2020-01-15 MED ORDER — ACETAMINOPHEN 650 MG RE SUPP: 650.0000 mg | Freq: Four times a day (QID) | RECTAL | Status: DC | PRN

## 2020-01-15 MED ORDER — SODIUM CHLORIDE 0.9% FLUSH
3.0000 mL | Freq: Two times a day (BID) | INTRAVENOUS | Status: DC
  Administered 2020-01-15 – 2020-01-18 (×6): 3 mL via INTRAVENOUS

## 2020-01-15 MED ORDER — POTASSIUM CHLORIDE CRYS ER 20 MEQ PO TBCR
40.0000 meq | EXTENDED_RELEASE_TABLET | Freq: Two times a day (BID) | ORAL | Status: AC
  Administered 2020-01-15 (×2): 40 meq via ORAL
  Filled 2020-01-15: qty 2

## 2020-01-15 MED ORDER — MORPHINE SULFATE (PF) 2 MG/ML IV SOLN
2.0000 mg | INTRAVENOUS | Status: DC | PRN
  Administered 2020-01-19 (×2): 2 mg via INTRAVENOUS
  Filled 2020-01-15 (×2): qty 1

## 2020-01-15 MED ORDER — POTASSIUM CHLORIDE 10 MEQ/100ML IV SOLN
10.0000 meq | INTRAVENOUS | Status: DC
  Administered 2020-01-15 (×3): 10 meq via INTRAVENOUS
  Filled 2020-01-15 (×3): qty 100

## 2020-01-15 MED ORDER — POTASSIUM CHLORIDE 10 MEQ/100ML IV SOLN
INTRAVENOUS | Status: AC
  Administered 2020-01-15: 10 meq
  Filled 2020-01-15: qty 100

## 2020-01-15 MED ORDER — HYDROCODONE-ACETAMINOPHEN 5-325 MG PO TABS: 1.0000 | ORAL_TABLET | ORAL | Status: DC | PRN

## 2020-01-15 NOTE — Progress Notes (Addendum)
CRITICAL VALUE ALERT  Critical Value:  INR 4.7 by telephone from [facility]. (lab)  Date & Time Notied:  01/15/20 at 0549  Provider Notified: M. Sharlet Salina via Shea Evans at [phone]  Orders Received/Actions taken: Administer KCl 10 mEq x4 starting at 0715, Potassium 20 mEq by mouth starting at 1000.

## 2020-01-15 NOTE — Progress Notes (Signed)
ANTICOAGULATION CONSULT NOTE - Initial Consult  Pharmacy Consult for: Heparin bridge one INR <3 (warfarin on hold) Indication: history of mitral valve replacement  Allergies  Allergen Reactions  . Penicillins Nausea And Vomiting    08/23/2018 pt has recently tolerated Amoxicillin per pt and wife Has patient had a PCN reaction causing immediate rash, facial/tongue/throat swelling, SOB or lightheadedness with hypotension: No Has patient had a PCN reaction causing severe rash involving mucus membranes or skin necrosis: No Has patient had a PCN reaction that required hospitalization: Unknown Has patient had a PCN reaction occurring within the last 10 years: No If all of the above answers are "NO", then may proceed with Cephalospor  . Vancomycin Other (See Comments)    Kidney problems    Patient Measurements: Height: 5\' 6"  Weight: 54.9 kg  Vital Signs: Temp: 97.6 F (36.4 C) (11/04 0328) Temp Source: Oral (11/04 0328) BP: 146/81 (11/04 0328) Pulse Rate: 81 (11/04 0328)  Labs: Recent Labs    02/02/2020 1319 02/07/2020 1346 01/14/20 0412 01/15/20 0330  HGB 10.8*  --   --  10.1*  HCT 34.0*  --   --  31.3*  PLT 198  --   --  167  LABPROT  --  37.0* 38.2* 43.0*  INR  --  3.9* 4.1* 4.7*  CREATININE 2.88*  --   --  2.95*    Estimated Creatinine Clearance: 15.5 mL/min (A) (by C-G formula based on SCr of 2.95 mg/dL (H)).   Medical History: Past Medical History:  Diagnosis Date  . BPH (benign prostatic hyperplasia)   . Chronic anticoagulation    coumadin for mechanical valves  . CKD (chronic kidney disease), stage III ([place])   . Diabetes mellitus type 2, diet-controlled ([place])   . Gastric cancer ([place]) 2005   s/p gastrectomy  . High cholesterol   . HOH (hard of hearing)   . Hypertension   . Rheumatic heart disease   . S/P MVR (mitral valve replacement) 2015   22mm StJude mechanical valve  . Stroke [facility])     Medications: Warfarin PTA for mechanical mitral valve Home dose:  Warfarin 1.5 mg PO daily Last dose PTA: 01/12/2020 at 2200  Assessment: 56 y/oM with PMH of rheumatic heart disease s/p mitral valve replacement, stroke on chronic warfarin anticoagulation presented to [facility] ED on 01/22/2020 with worsening back pain after a recent fall. CT abdomen pelvis negative for retroperitoneal hematoma. Planning for possible kyphoplasty - tentatively scheduled for 11/8.   Anticoagulation will be transitioned to heparin drip once INR becomes subtherapeutic in anticipation for procedure.  Baseline labs:   CBC: Hgb 10.8, Pltc 198K. INR supratherapeutic at 3.9.   Today, 01/15/20  INR 4.7 remains supratherapeutic today with slight increase  No significant new DDI. Pt on amiodarone + warfarin PTA  No bleeding issues documented  Goal of Therapy:  INR 3-3.5 (per Anticoagulation Clinic notes) Monitor platelets by anticoagulation protocol: Yes   Plan:   Warfarin on HOLD until after invasive procedures  INR supratherapeutic. No heparin drip at this time.  INR daily. Will initiate heparin drip once INR <3  Lenis Noon, PharmD 01/15/20 9:35 AM

## 2020-01-15 NOTE — Progress Notes (Signed)
Robert [name]  ZDG:[id number] DOB: 04/19/1940 DOA: 01/27/2020 PCP: Bonnita Nasuti, MD    Brief Narrative:  79yo with a history of DM2, chronic voiding difficulty followed at [facility], CVA, gastric cancer status post gastrectomy, rheumatic heart disease/severe mitral stenosis status post MVR 2015 on chronic warfarin, persistent atrial fibrillation, and systolic CHF with EF 59-56% who presented to the ED with worsening back pain after a recent mechanical fall.  At the time of his fall he was evaluated in the ED and found to have an L2 compression fracture.  He was able to be sent home but his pain has not been controlled with pain medications.  Significant Events:  11/2 admit via ED  Antimicrobials:  None  DVT prophylaxis: Warfarin  Subjective: Afebrile.  Blood pressure mildly elevated but otherwise vital signs stable.  Significant hypokalemia noted on labs.  Creatinine continues to climb.  The patient tells me he resting more comfortably today and has no new complaints.  He tells me he is urinating without any difficulty though it "takes some time" as it chronically does.  Assessment & Plan:  Subacute L2 compression fracture status post mechanical fall -intractable low back pain IR following for possible kyphoplasty likely early next week  Acute kidney injury on CKD stage IIIb Baseline creatinine 1.19 May 2019 -creatinine 2.88 at presentation -CT noted increased bilateral perinephric inflammatory stranding and distended bladder but no evidence of hydronephrosis - suspect this is prerenal with poor oral intake in setting of narcotic use and uncontrolled pain -continue to hydrate and follow trend - avoid NSAIDs and IV contrast   Persistent valvular atrial fibrillation Continue amiodarone and Coreg -holding Coumadin with supratherapeutic INR and possible need for procedure -establish therapeutic INR goal of 3-3.5 -allow to drift down into therapeutic range at this time and  then will transition to heparin when drops below therapeutic range in anticipation of intervention early next week  Coumadin induced coagulopathy INR supratherapeutic -holding Coumadin for now -no acute bleeding therefore will not reverse for now but may require reversal depending upon timing of possible IR intervention  Chronic systolic CHF No evidence of an acute exacerbation  Chronic constipation Due to narcotic use and low back injury -significantly increased bowel regimen - follow   Hypokalemia Likely a consequence of poor oral intake - cont to supplement and follow -magnesium is not low  Macrocytic anemia B12 and folate are not low -iron is normal  HTN Blood pressure reasonably controlled at this time  HLD Continue usual home medication  DM2 - diet controlled CBG well controlled  Code Status: FULL CODE Family Communication:  Status is: Inpatient  Remains inpatient appropriate because:Ongoing active pain requiring inpatient pain management   Dispo: The patient is from: Home              Anticipated d/c is to: Home              Anticipated d/c date is: > 3 days              Patient currently is not medically stable to d/c.   Consultants:  IR  Objective: Blood pressure (!) 146/81, pulse 81, temperature 97.6 F (36.4 C), temperature source Oral, resp. rate 15, weight 54.9 kg, SpO2 95 %. No intake or output data in the 24 hours ending 01/15/20 0905 Filed Weights   01/14/20 0515  Weight: 54.9 kg    Examination: General: No acute respiratory distress Lungs: CTA B - no wheezing  Cardiovascular: RRR - no M or rub  Abdomen: NT/ND, soft, bs+, no mass Extremities: No C/C/E B LE   CBC: Recent Labs  Lab 02/08/2020 1319 01/15/20 0330  WBC 6.5 5.0  HGB 10.8* 10.1*  HCT 34.0* 31.3*  MCV 105.9* 105.4*  PLT 198 194   Basic Metabolic Panel: Recent Labs  Lab 02/03/2020 1319 01/17/2020 1600 01/15/20 0330  NA 144  --  139  K 3.3*  --  2.9*  CL 103  --  100  CO2  29  --  28  GLUCOSE 81  --  91  BUN 35*  --  38*  CREATININE 2.88*  --  2.95*  CALCIUM 8.7*  --  8.2*  MG  --  2.6*  --    GFR: Estimated Creatinine Clearance: 15.5 mL/min (A) (by C-G formula based on SCr of 2.95 mg/dL (H)).  Liver Function Tests: Recent Labs  Lab 01/31/2020 1319 01/15/20 0330  AST 37 35  ALT 21 20  ALKPHOS 68 62  BILITOT 2.2* 1.9*  PROT 7.2 6.4*  ALBUMIN 3.5 3.0*    Coagulation Profile: Recent Labs  Lab 01/09/20 0851 01/21/2020 1346 01/14/20 0412 01/15/20 0330  INR 2.8 3.9* 4.1* 4.7*    HbA1C: Hgb A1c MFr Bld  Date/Time Value Ref Range Status  01/15/2020 03:33 AM 5.1 4.8 - 5.6 % Final    Comment:    (NOTE) Pre diabetes:          5.7%-6.4%  Diabetes:              >6.4%  Glycemic control for   <7.0% adults with diabetes   12/02/2016 02:36 AM 6.0 (H) 4.8 - 5.6 % Final    Comment:    (NOTE) Pre diabetes:          5.7%-6.4% Diabetes:              >6.4% Glycemic control for   <7.0% adults with diabetes     Recent Results (from the past 240 hour(s))  Respiratory Panel by RT PCR (Flu A&B, Covid) - Nasopharyngeal Swab     Status: None   Collection Time: 02/10/2020  3:08 PM   Specimen: Nasopharyngeal Swab  Result Value Ref Range Status   SARS Coronavirus 2 by RT PCR NEGATIVE NEGATIVE Final    Comment: (NOTE) SARS-CoV-2 target nucleic acids are NOT DETECTED.  The SARS-CoV-2 RNA is generally detectable in upper respiratoy specimens during the acute phase of infection. The lowest concentration of SARS-CoV-2 viral copies this assay can detect is 131 copies/mL. A negative result does not preclude SARS-Cov-2 infection and should not be used as the sole basis for treatment or other patient management decisions. A negative result may occur with  improper specimen collection/handling, submission of specimen other than nasopharyngeal swab, presence of viral mutation(s) within the areas targeted by this assay, and inadequate number of viral  copies (<131 copies/mL). A negative result must be combined with clinical observations, patient history, and epidemiological information. The expected result is Negative.  Fact Sheet for Patients:  PinkCheek.be  Fact Sheet for Healthcare Providers:  GravelBags.it  This test is no t yet approved or cleared by the [place] FDA and  has been authorized for detection and/or diagnosis of SARS-CoV-2 by FDA under an Emergency Use Authorization (EUA). This EUA will remain  in effect (meaning this test can be used) for the duration of the COVID-19 declaration under Section 564(b)(1) of the Act, 21 U.S.C. section 360bbb-3(b)(1), unless the authorization is  terminated or revoked sooner.     Influenza A by PCR NEGATIVE NEGATIVE Final   Influenza B by PCR NEGATIVE NEGATIVE Final    Comment: (NOTE) The Xpert Xpress SARS-CoV-2/FLU/RSV assay is intended as an aid in  the diagnosis of influenza from Nasopharyngeal swab specimens and  should not be used as a sole basis for treatment. Nasal washings and  aspirates are unacceptable for Xpert Xpress SARS-CoV-2/FLU/RSV  testing.  Fact Sheet for Patients: PinkCheek.be  Fact Sheet for Healthcare Providers: GravelBags.it  This test is not yet approved or cleared by the [place] FDA and  has been authorized for detection and/or diagnosis of SARS-CoV-2 by  FDA under an Emergency Use Authorization (EUA). This EUA will remain  in effect (meaning this test can be used) for the duration of the  Covid-19 declaration under Section 564(b)(1) of the Act, 21  U.S.C. section 360bbb-3(b)(1), unless the authorization is  terminated or revoked. Performed at [facility], [place] [address] [place], [place] [address]      Scheduled Meds:  amiodarone  200 mg Oral Daily   aspirin EC  81 mg Oral Daily   atorvastatin  20  mg Oral QHS   carvedilol  6.25 mg Oral BID WC   hydrALAZINE  25 mg Oral BID   lidocaine  1 patch Transdermal Q24H   Plecanatide  1 tablet Oral Daily   potassium chloride  20 mEq Oral BID   senna-docusate  1 tablet Oral BID   tamsulosin  0.8 mg Oral Daily   vitamin B-12  1,000 mcg Oral Daily   Warfarin - Pharmacist Dosing Inpatient   Does not apply q1600     LOS: 2 days   Cherene Altes, MD [facility] Office  [phone] Pager - Text Page per Shea Evans  If 7PM-7AM, please contact night-coverage per Amion 01/15/2020, 9:05 AM

## 2020-01-15 NOTE — Consult Note (Signed)
*   Late entry *  I went to the patient's room yesterday afternoon around 1645 to briefly introduce myself, explain the role of IR and what the kyphoplasty procedure involves. The procedure was explained in detail to the patient and his wife. I also spoke by telephone to the patient's brother who is a Energy manager.   The patient and his wife understand that there is more work up to be done including an MRI lumbar spine and insurance approval. The patient has also been on Coumadin due to a mechanical heart valve. He will need to be transitioned to a shorter acting anticoagulant and have an INR well within normal limits before we can proceed. INR this morning is 4.7. This information was also shared with Dr. Thereasa Solo, the ordering provider.   IR tentatively planning for Monday 02/09/2020. I will plan to assess/evaluate the patient Friday 01/16/20 and obtain consent.  Please call IR with any questions.  Soyla Dryer, [place] [phone] 01/15/2020, 9:15 AM

## 2020-01-16 ENCOUNTER — Inpatient Hospital Stay (HOSPITAL_COMMUNITY): Payer: BC Managed Care – PPO

## 2020-01-16 DIAGNOSIS — M549 Dorsalgia, unspecified: Secondary | ICD-10-CM | POA: Diagnosis not present

## 2020-01-16 LAB — URINALYSIS, ROUTINE W REFLEX MICROSCOPIC
Bilirubin Urine: NEGATIVE
Glucose, UA: NEGATIVE mg/dL
Hgb urine dipstick: NEGATIVE
Ketones, ur: NEGATIVE mg/dL
Nitrite: NEGATIVE
Protein, ur: NEGATIVE mg/dL
Specific Gravity, Urine: 1.011 (ref 1.005–1.030)
pH: 5 (ref 5.0–8.0)

## 2020-01-16 LAB — BASIC METABOLIC PANEL
Anion gap: 10 (ref 5–15)
BUN: 38 mg/dL — ABNORMAL HIGH (ref 8–23)
CO2: 25 mmol/L (ref 22–32)
Calcium: 8.2 mg/dL — ABNORMAL LOW (ref 8.9–10.3)
Chloride: 106 mmol/L (ref 98–111)
Creatinine, Ser: 3.16 mg/dL — ABNORMAL HIGH (ref 0.61–1.24)
GFR, Estimated: 19 mL/min — ABNORMAL LOW (ref >60)
Glucose, Bld: 86 mg/dL (ref 70–99)
Potassium: 5 mmol/L (ref 3.5–5.1)
Sodium: 141 mmol/L (ref 135–145)

## 2020-01-16 LAB — CREATININE, URINE, RANDOM: Creatinine, Urine: 88.68 mg/dL

## 2020-01-16 LAB — GLUCOSE, CAPILLARY
Glucose-Capillary: 74 mg/dL (ref 70–99)
Glucose-Capillary: 151 mg/dL — ABNORMAL HIGH (ref 70–99)
Glucose-Capillary: 147 mg/dL — ABNORMAL HIGH (ref 70–99)
Glucose-Capillary: 97 mg/dL (ref 70–99)

## 2020-01-16 LAB — PROTIME-INR
INR: 5.2 (ref 0.8–1.2)
INR: 2.4 — ABNORMAL HIGH (ref 0.8–1.2)
Prothrombin Time: 46.3 seconds — ABNORMAL HIGH (ref 11.4–15.2)
Prothrombin Time: 25 seconds — ABNORMAL HIGH (ref 11.4–15.2)

## 2020-01-16 LAB — SODIUM, URINE, RANDOM: Sodium, Ur: 28 mmol/L

## 2020-01-16 MED ORDER — VITAMIN K1 10 MG/ML IJ SOLN
5.0000 mg | Freq: Once | INTRAVENOUS | Status: AC
  Administered 2020-01-16: 5 mg via INTRAVENOUS
  Filled 2020-01-16: qty 0.5

## 2020-01-16 MED ORDER — HEPARIN (PORCINE) 25000 UT/250ML-% IV SOLN
800.0000 [IU]/h | INTRAVENOUS | Status: DC
  Administered 2020-01-16: 800 [IU]/h via INTRAVENOUS
  Filled 2020-01-16: qty 250

## 2020-01-16 NOTE — Consult Note (Signed)
Chief Complaint: Patient was seen in consultation today for  Chief Complaint  Patient presents with  . Back Pain    Referring Physician(s): Dr. Thereasa Solo  Supervising Physician: Sandi Mariscal  Patient Status: [name] - In-pt  History of Present Illness: Robert [name] is a 79 y.o. male with a medical history significant for DM2, lower urinary tract symptoms and voiding difficulty (follows with [facility]), CVA, gastric cancer s/p gastrectomy, rheumatic heart disease and severe mitral stenosis with moderate pulmonary hypertension s/p MVR (2015, on coumadin), persistent atrial fibrillation, poor balance, HFrEF (last EF 35 to 40% in August). He fell in early October and has presented to the ED numerous times with back pain. Imaging revealed a gluteal hematoma and an L2 compression fracture. He failed conservative therapy at home with pain medicine and he presented to the ED again on 02/03/2020 with worsening back pain.   MR lumbar spine 01/15/20 IMPRESSION: 1. Acute/subacute compression fracture of the superior endplate of L2 with approximately 30% loss of vertebral body height. No retropulsion. 2. Mild lumbar degenerative changes without significant spinal canal or neural foraminal stenosis. 3. Edema in the paraspinal musculature in the lower lumbar spine.  Interventional  Radiology has been asked to evaluate this patient for an image-guided L2 kyphoplasty/vertebroplasty. This case has been evaluated by and procedure approved by Dr. Pascal Lux.   Past Medical History:  Diagnosis Date  . BPH (benign prostatic hyperplasia)   . Chronic anticoagulation    coumadin for mechanical valves  . CKD (chronic kidney disease), stage III ([place])   . Diabetes mellitus type 2, diet-controlled ([place])   . Gastric cancer ([place]) 2005   s/p gastrectomy  . High cholesterol   . HOH (hard of hearing)   . Hypertension   . Rheumatic heart disease   . S/P MVR (mitral valve replacement) 2015   72mm StJude mechanical  valve  . Stroke [facility])     Past Surgical History:  Procedure Laterality Date  . CARDIAC CATHETERIZATION N/A 05/10/2015   Procedure: Right/Left Heart Cath and Coronary Angiography;  Surgeon: Burnell Blanks, MD;  Location: [place] [facility];  Service: Cardiovascular;  Laterality: N/A;  . COLONOSCOPY  11/22/2017   [facility]    . IR ANGIO INTRA EXTRACRAN SEL COM CAROTID INNOMINATE BILAT MOD SED  12/01/2016  . IR ANGIO VERTEBRAL SEL SUBCLAVIAN INNOMINATE UNI R MOD SED  12/01/2016  . MITRAL VALVE REPLACEMENT  10/24/2013   39mm StJude mechanical valve  . PARTIAL GASTRECTOMY  2005  . SMALL BOWEL ENTEROSCOPY  11/22/2017   [facility]     Allergies: Penicillins and Vancomycin  Medications: Prior to Admission medications   Medication Sig Start Date End Date Taking? Authorizing Provider  amiodarone (PACERONE) 200 MG tablet Take 1 tablet (200 mg total) by mouth 2 (two) times daily for 14 days, THEN 1 tablet (200 mg total) daily. Patient taking differently: Take 200 mg by mouth daily. 10/30/19 11/12/20 Yes HiltyNadean Corwin, MD  aspirin EC 81 MG tablet Take 81 mg by mouth daily.   Yes [provider]  atorvastatin (LIPITOR) 20 MG tablet TAKE 1 TABLET BY MOUTH EVERYDAY AT BEDTIME Patient taking differently: Take 20 mg by mouth daily.  12/30/19  Yes Hilty, Nadean Corwin, MD  calcium carbonate (OSCAL) 1500 (600 Ca) MG TABS tablet Take 1,500 mg by mouth daily with breakfast.   Yes [provider]  carvedilol (COREG) 6.25 MG tablet Take 2 tablets (12.5 mg total) by  mouth every morning AND 1 tablet (6.25 mg total) every evening. 12.5 mg in the morning and 6.25mg  in the evening) (2 tablets in the Morning and 1 Tablet in the Evening). Patient taking differently: Takes 2 tablets (12.5 mg) in the morning and 1 tablet (6.25 mg) in the evening. 10/21/19  Yes Deberah Pelton, NP  ferrous sulfate 325 (65 FE) MG tablet Take 1 tablet (325 mg total) by mouth 2 (two) times daily  with a meal. 05/13/15  Yes Regalado, Belkys A, MD  furosemide (LASIX) 20 MG tablet Take 2 tablets (40mg ) by mouth in the morning and 1 tablet (20mg ) in the afternoon Patient taking differently: Take 20-40 mg by mouth See admin instructions. Take 2 tablets (40mg ) by mouth in the morning and 1 tablet (20mg ) in the afternoon 12/16/19  Yes Hilty, Nadean Corwin, MD  hydrALAZINE (APRESOLINE) 25 MG tablet TAKE 1 TABLET BY MOUTH TWICE A DAY Patient taking differently: Take 25 mg by mouth in the morning and at bedtime.  12/25/19  Yes Hilty, Nadean Corwin, MD  lidocaine (LIDODERM) 5 % Place 1 patch onto the skin daily. Remove & Discard patch within 12 hours or as directed by MD Patient taking differently: Place 1 patch onto the skin daily.  12/19/19  Yes Quintella Reichert, MD  Multiple Vitamin (MULTIVITAMIN WITH MINERALS) TABS tablet Take 1 tablet by mouth daily.   Yes [provider]  Plecanatide (TRULANCE) 3 MG TABS Take 1 tablet by mouth daily. 01/09/20  Yes Jackquline Denmark, MD  senna-docusate (SENOKOT-S) 8.6-50 MG tablet Take 1 tablet by mouth at bedtime as needed for mild constipation or moderate constipation. 01/03/20  Yes Long, Wonda Olds, MD  tamsulosin (FLOMAX) 0.4 MG CAPS capsule Take 0.8 mg by mouth daily.   Yes [provider]  traMADol (ULTRAM) 50 MG tablet Take 50 mg by mouth in the morning, at noon, and at bedtime.    Yes [provider]  vitamin B-12 (CYANOCOBALAMIN) 1000 MCG tablet Take 1 tablet (1,000 mcg total) by mouth daily. 05/13/15  Yes Regalado, Belkys A, MD  warfarin (COUMADIN) 3 MG tablet TAKE 1 TO 1 & 1/2 TABLETS BY MOUTH DAILY AS DIRECTED BY COUMADIN CLINIC Patient taking differently: Take 1.5 mg by mouth daily.  01/06/20  Yes Hilty, Nadean Corwin, MD  diclofenac Sodium (VOLTAREN) 1 % GEL Apply 2 g topically 4 (four) times daily as needed. Patient not taking: Reported on 01/20/2020 01/03/20   Long, Wonda Olds, MD     Family History  Problem Relation Age of Onset  . Hypertension  Mother   . Diabetes Mother   . Cancer Brother   . Colon cancer Neg Hx   . Esophageal cancer Neg Hx     Social History   Socioeconomic History  . Marital status: Married    Spouse name: Not on file  . Number of children: Not on file  . Years of education: Not on file  . Highest education level: Not on file  Occupational History  . Occupation: Astronomer professor     Employer: [employer]  Tobacco Use  . Smoking status: Never Smoker  . Smokeless tobacco: Never Used  Vaping Use  . Vaping Use: Never used  Substance and Sexual Activity  . Alcohol use: No    Alcohol/week: 0.0 standard drinks  . Drug use: No  . Sexual activity: Not Currently  Other Topics Concern  . Not on file  Social History Narrative   Lives in [place] with  his wife.   Social Determinants of Health   Financial Resource Strain:   . Difficulty of Paying Living Expenses: Not on file  Food Insecurity:   . Worried About Charity fundraiser in the Last Year: Not on file  . Ran Out of Food in the Last Year: Not on file  Transportation Needs:   . Lack of Transportation (Medical): Not on file  . Lack of Transportation (Non-Medical): Not on file  Physical Activity:   . Days of Exercise per Week: Not on file  . Minutes of Exercise per Session: Not on file  Stress:   . Feeling of Stress : Not on file  Social Connections:   . Frequency of Communication with Friends and Family: Not on file  . Frequency of Social Gatherings with Friends and Family: Not on file  . Attends Religious Services: Not on file  . Active Member of Clubs or Organizations: Not on file  . Attends Archivist Meetings: Not on file  . Marital Status: Not on file    Review of Systems: A 12 point ROS discussed and pertinent positives are indicated in the HPI above.  All other systems are negative.  Review of Systems  Constitutional: Positive for appetite change and fatigue.  Respiratory: Negative for cough and  shortness of breath.   Cardiovascular: Negative for chest pain and leg swelling.  Gastrointestinal: Positive for constipation. Negative for abdominal pain, nausea and vomiting.  Genitourinary: Positive for difficulty urinating.  Musculoskeletal: Positive for back pain.  Neurological: Negative for headaches.    Vital Signs: BP (!) 140/96 (BP Location: Left Arm)   Pulse 88   Temp 97.7 F (36.5 C) (Oral)   Resp 12   Ht 5\' 6"  (1.676 m)   Wt 121 lb 0.5 oz (54.9 kg)   SpO2 98%   BMI 19.54 kg/m   Physical Exam Constitutional:      General: He is not in acute distress. HENT:     Mouth/Throat:     Mouth: Mucous membranes are moist.     Pharynx: Oropharynx is clear.  Cardiovascular:     Rate and Rhythm: Rhythm irregular.     Comments: Atrial fibrillation. Mechanical heart valve click.  Pulmonary:     Effort: Pulmonary effort is normal.     Breath sounds: Normal breath sounds.  Abdominal:     General: Bowel sounds are normal.     Palpations: Abdomen is soft.  Skin:    General: Skin is warm and dry.  Neurological:     Mental Status: He is alert and oriented to person, place, and time.  Psychiatric:        Mood and Affect: Mood normal.        Behavior: Behavior normal.        Thought Content: Thought content normal.        Judgment: Judgment normal.     Imaging: CT ABDOMEN PELVIS WO CONTRAST  Result Date: 01/15/2020 CLINICAL DATA:  79 year old male with recent fall, worsening back pain, anticoagulated. Query retroperitoneal bleeding. EXAM: CT ABDOMEN AND PELVIS WITHOUT CONTRAST TECHNIQUE: Multidetector CT imaging of the abdomen and pelvis was performed following the standard protocol without IV contrast. COMPARISON:  Lumbar spine CT today reported separately. Noncontrast CT Abdomen and Pelvis 01/03/2020. FINDINGS: Lower chest: Stable cardiomegaly. No pericardial effusion. Prosthetic mitral valve. Calcified aortic atherosclerosis. Small/trace layering right pleural effusion is  stable. But there are diffuse centrilobular ground-glass pulmonary nodules at both lung bases, new since June  although not significantly changed from last month. Some associated pulmonary septal thickening. No consolidation. Hepatobiliary: Negative noncontrast liver. Small volume vicarious contrast excretion to the gallbladder which otherwise appears normal. Pancreas: Mostly atrophied.  Otherwise negative. Spleen: Trace perisplenic free fluid is stable with simple fluid density. Otherwise negative. Adrenals/Urinary Tract: Adrenal glands remain normal. Nonobstructed kidneys, but increased bilateral perinephric inflammatory stranding since last month. No perinephric hematoma. No nephrolithiasis. Ureters are decompressed. Similar distension of the urinary bladder today (estimated bladder volume 280 mL) with stable right greater than left bladder diverticula (the larger measuring 3.3 cm on the right series 2, image 68). No perivesical stranding. Stomach/Bowel: There is a trace amount of simple density free fluid in the both lower quadrants (coronal images 60 and 61) adjacent to normal appearing bowel loops. The appendix remains normal, and also terminates in the right lower quadrant area (containing some oral contrast on coronal image 74). Decreased but not resolved retained stool in redundant large bowel since last month. No large bowel inflammation. Diverticulosis of the ascending colon and hepatic flexure. No dilated small bowel. Stable fat containing umbilical hernia. Stomach and duodenum are decompressed. No free air. No mesenteric stranding. Vascular/Lymphatic: Vascular patency is not evaluated in the absence of IV contrast. Aortoiliac calcified atherosclerosis. Normal caliber abdominal aorta. Reproductive: Negative. Other: No pelvic free fluid.  No retroperitoneal hematoma. Musculoskeletal: Lumbar Spine details reported separately today. No other acute osseous abnormality identified. IMPRESSION: 1. Negative for  retroperitoneal hematoma. Trace simple appearing free fluid in both lower quadrants, not significantly changed from last month. Lumbar Spine details reported separately. 2. Increased bilateral perinephric inflammatory stranding since last month is nonspecific but suspicious for acute renal failure. Distended bladder (estimated volume 280 mL) with stable bladder diverticula. 3. Nonspecific centrilobular ground-glass nodules at both lung bases, new since June. Some superimposed septal thickening and stable trace pleural fluid. But the lung appearance would be atypical for pulmonary edema. More likely is infectious (RSV, adenovirus, mycoplasma) or inflammatory (respiratory bronchiolitis) etiology. 4. Aortic Atherosclerosis (ICD10-I70.0). Electronically Signed   By: Genevie Ann M.D.   On: 01/12/2020 14:17   CT ABDOMEN PELVIS WO CONTRAST  Result Date: 01/03/2020 CLINICAL DATA:  Abdominal pain and back pain after fall. Known right gluteal hematoma EXAM: CT ABDOMEN AND PELVIS WITHOUT CONTRAST TECHNIQUE: Multidetector CT imaging of the abdomen and pelvis was performed following the standard protocol without IV contrast. COMPARISON:  CT 12/19/2019 FINDINGS: Lower chest: Trace bilateral pleural effusions, right greater than left. Stable cardiomegaly. Similar appearance of circumferential thickening of the distal esophagus. Hepatobiliary: Unremarkable unenhanced appearance of the liver. No focal liver lesion identified. Gallbladder within normal limits. No hyperdense gallstone. No biliary dilatation. Pancreas: Pancreatic atrophy without evidence of focal abnormality or inflammation. Spleen: Normal in size without focal abnormality. Adrenals/Urinary Tract: Unremarkable adrenal glands. Stable appearance of the bilateral kidneys. No renal stone or hydronephrosis. Right-sided bladder wall diverticulum an additional small left bladder wall diverticulum (series 2, image 66). Bladder is moderately distended. Stomach/Bowel: Stomach  is within normal limits. Appendix appears normal (series 2, image 45). Scattered diverticulosis. Moderate to large volume of stool within the colon including inspissated appearing stool in the rectosigmoid colon. No evidence of bowel wall thickening, distention, or inflammatory changes. Vascular/Lymphatic: Aortic atherosclerosis. No enlarged abdominal or pelvic lymph nodes. Reproductive: Prostate is unremarkable. Other: Supraumbilical fat containing hernias are again seen both containing a small amount of fluid, which has slightly increased within the larger more centrally located hernia sac (series 2, image 42). No bowel  extends into either hernia. No ascites. No pneumoperitoneum. Musculoskeletal: Soft tissue hematoma inferior to the right gluteal musculature has slightly decreased in size from prior measuring approximately 3.4 x 1.7 cm (series 5, image 4), previously measured 4.7 x 2.8 cm. New L2 vertebral body compression fracture with approximately 20% vertebral body height loss (series 7, image 67). No significant bony retropulsion. No evidence of fracture extension into the posterior elements. Remaining osseous structures appear unchanged. IMPRESSION: 1. New L2 vertebral body compression fracture with approximately 20% vertebral body height loss. No significant bony retropulsion. 2. Soft tissue hematoma inferior to the right gluteal musculature has slightly decreased in size from prior study. 3. Trace bilateral pleural effusions, right greater than left. 4. Moderate to large volume of stool within the colon including inspissated appearing stool in the rectosigmoid colon. Correlate for constipation. 5. Similar appearance of circumferential thickening of the distal esophagus, which may reflect esophagitis. 6. Supraumbilical fat containing hernias both containing a small amount of fluid, which has slightly increased within the larger more centrally located hernia sac. No bowel extends into either hernia. 7.  Aortic atherosclerosis. (ICD10-I70.0). Electronically Signed   By: Davina Poke D.O.   On: 01/03/2020 15:41   CT Abdomen Pelvis Wo Contrast  Result Date: 12/19/2019 CLINICAL DATA:  Progressive right pelvic pain and bruising since falling 3 days ago. Difficulty ambulating and bearing weight. EXAM: CT ABDOMEN AND PELVIS WITHOUT CONTRAST TECHNIQUE: Multidetector CT imaging of the abdomen and pelvis was performed following the standard protocol without IV contrast. COMPARISON:  Prior CT 08/26/2019. FINDINGS: Lower chest: Prominent interstitial markings at both lung bases and a trace right pleural effusion are stable. There is stable cardiomegaly post mitral valve replacement. Aortic and coronary artery atherosclerosis are noted. There is mild distal esophageal wall thickening which is similar to the previous study. Hepatobiliary: No hepatic abnormalities are identified on noncontrast imaging. There is dependent material in the gallbladder lumen which may reflect sludge or small stones. No evidence of gallbladder wall thickening or biliary dilatation. Pancreas: Atrophied without focal abnormality or surrounding inflammation. Spleen: Normal in size without focal abnormality. Adrenals/Urinary Tract: Both adrenal glands appear normal. Stable bilateral renal cortical thinning and small exophytic lesions bilaterally. No evidence of urinary tract calculus or hydronephrosis. Bladder wall thickening and right-sided diverticulum are again noted. Stomach/Bowel: No evidence of bowel wall thickening, distention or surrounding inflammatory change. The appendix appears normal. Moderate colonic stool burden and scattered diverticula are noted. Vascular/Lymphatic: There are no enlarged abdominal or pelvic lymph nodes. Aortic and branch vessel atherosclerosis. Reproductive: The prostate gland and seminal vesicles appear normal. Other: A bilobed right periumbilical hernia has mildly enlarged, measuring up to 5.4 cm on image 7/5.  There is a small amount of fluid within this hernia, but no herniated bowel. Stable small left inguinal hernia containing fat. Musculoskeletal: Subcutaneous edema anteriorly in the right lower abdominal wall and laterally in the right pelvis without focal hematoma. There is a probable 4.7 cm hematoma inferiorly in the right gluteus maximus muscle adjacent to the ischial tuberosity (image 82/2). No evidence of acute pelvic, proximal femoral or spinal fracture. IMPRESSION: 1. Probable inferior right buttocks hematoma with additional edema in the right lower abdominal wall and lateral right pelvis. No evidence of acute fracture. 2. Mildly enlarged bilobed right periumbilical hernia containing a small amount of fluid, but no herniated bowel. 3. Stable chronic bladder wall thickening and right-sided diverticulum. 4. Aortic Atherosclerosis (ICD10-I70.0). Electronically Signed   By: Richardean Sale M.D.   On:  12/19/2019 18:12   DG Pelvis 1-2 Views  Result Date: 12/21/2019 CLINICAL DATA:  Bilateral hip pain after fall 4 days prior EXAM: PELVIS - 1-2 VIEW COMPARISON:  CT abdomen pelvis 12/19/2018 FINDINGS: The osseous structures appear diffusely demineralized which may limit detection of small or nondisplaced fractures. Bones of the pelvis appear intact and congruent. Sacral arcs appear contiguous. Proximal femora are intact and normally located within the acetabula. Degenerative changes in the lower lumbar spine, hips and pelvis are mild-to-moderate. Mild soft tissue thickening is noted in the gluteal soft tissues, nonspecific. Vascular calcium noted throughout the pelvis and proximal thighs. Bowel gas pattern is unremarkable. IMPRESSION: 1. No acute fracture or dislocation of the pelvis. 2. Mild soft tissue thickening noted within the bilateral gluteal tissues and hips with gluteal hematoma seen on the comparison CT on the right and more contusive changes in the left on prior cross-sectional. If there is concern for  increase in size or other complication, consider cross-sectional imaging. 3. Mild to moderate degenerative changes of the hips and pelvis. 4. Vascular calcium throughout the pelvis and proximal thighs. Electronically Signed   By: Lovena Le M.D.   On: 12/21/2019 18:49   MR LUMBAR SPINE WO CONTRAST  Result Date: 01/15/2020 CLINICAL DATA:  L2 compression fracture. EXAM: MRI LUMBAR SPINE WITHOUT CONTRAST TECHNIQUE: Multiplanar, multisequence MR imaging of the lumbar spine was performed. No intravenous contrast was administered. COMPARISON:  CT of the lumbar spine January 13, 2020. FINDINGS: Segmentation:  Standard. Alignment:  Physiologic. Vertebrae: Compression fracture of the superior endplate of L2 with associated marrow edema, suggesting acute/subacute fracture. There is approximately 30% loss of vertebral body height with no retropulsion. No discitis or aggressive bone lesion Conus medullaris and cauda equina: Conus extends to the L1 level. Conus and cauda equina appear normal. Paraspinal and other soft tissues: Edema in the paraspinal musculature in the lower lumbar spine. T1 and T2 hyperintense lesion with hypointense rim within the left gluteus median, measuring at least 1.9 cm, likely a small hematoma. Disc levels: L1-2: No spinal canal or neural foraminal stenosis. L2-3: No spinal canal or neural foraminal stenosis. L3-4: Shallow left asymmetric disc bulge and mild facet degenerative changes resulting in minimal narrowing of the left neural foramen. No spinal canal stenosis. L4-5: Disc bulge with left foraminal/far lateral disc protrusion and annular tear, mild facet degenerative changes resulting in minimal narrowing of the left neural foramen. No spinal canal stenosis. L5-S1: No spinal canal or neural foraminal stenosis. IMPRESSION: 1. Acute/subacute compression fracture of the superior endplate of L2 with approximately 30% loss of vertebral body height. No retropulsion. 2. Mild lumbar degenerative  changes without significant spinal canal or neural foraminal stenosis. 3. Edema in the paraspinal musculature in the lower lumbar spine. Electronically Signed   By: Pedro Earls M.D.   On: 01/15/2020 17:04   CT L-SPINE NO CHARGE  Result Date: 02/03/2020 CLINICAL DATA:  79 year old male with worsening back pain. EXAM: CT LUMBAR SPINE WITHOUT CONTRAST TECHNIQUE: Technique: Multiplanar CT images of the lumbar spine were reconstructed from contemporary CT of the Abdomen and Pelvis. CONTRAST:  None COMPARISON:  CT Abdomen and Pelvis today are reported separately. [facility]  CT Abdomen and Pelvis 01/03/2020. FINDINGS: Segmentation: Normal. Alignment: Stable straightening of lumbar lordosis. No spondylolisthesis. Vertebrae: Compression fracture of the L2 superior endplate with up to 32% loss of vertebral body height has not significantly changed since 01/03/2020. Mild retropulsion of the posterosuperior endplate is stable with no  significant spinal stenosis. The L2 pedicles and posterior elements remain intact. Underlying osteopenia. Other lumbar and visible lower thoracic vertebrae appear intact. Visible sacrum, SI joints, and pelvis appears stable and intact. Paraspinal and other soft tissues: Abdominal and pelvic viscera are reported separately today. Lumbar paraspinal soft tissues remain within normal limits. Disc levels: Minimal lumbar spine degeneration for age. No CT evidence of lumbar spinal stenosis. IMPRESSION: 1. No significant change in the subacute L2 superior endplate compression fracture since 01/03/2020. 35% loss of height and mild retropulsion of the posterosuperior endplate without associated spinal stenosis. 2. Osteopenia. No new osseous abnormality in the lumbar spine. Mild for age lumbar spine degeneration. 3. CT Abdomen and Pelvis today reported separately. Electronically Signed   By: Genevie Ann M.D.   On: 02/09/2020 13:55   DG CHEST PORT 1 VIEW  Result  Date: 01/16/2020 CLINICAL DATA:  Back pain EXAM: PORTABLE CHEST 1 VIEW COMPARISON:  12/01/2016 FINDINGS: Cardiomegaly which is mild. Postoperative right chest and mitral valve replacement. Mild atelectatic opacity at the bases. Borderline vascular congestion. No pneumothorax or pleural effusion. IMPRESSION: Mild atelectatic opacity and borderline vascular congestion. Electronically Signed   By: Monte Fantasia M.D.   On: 01/16/2020 12:40    Labs:  CBC: Recent Labs    01/03/20 1419 02/04/2020 1319 01/15/20 0330 01/15/20 0934  WBC 6.0 6.5 5.0 5.4  HGB 8.4* 10.8* 10.1* 10.1*  HCT 26.9* 34.0* 31.3* 31.3*  PLT 194 198 167 170    COAGS: Recent Labs    01/21/2020 1346 01/14/20 0412 01/15/20 0330 01/16/20 0339  INR 3.9* 4.1* 4.7* 5.2*    BMP: Recent Labs    10/10/19 1630 10/10/19 1630 12/02/19 1051 12/02/19 1051 12/15/19 1030 12/19/19 1739 01/02/20 1632 01/18/2020 1319 01/15/20 0330 01/16/20 0339  NA 144   < > 140   < > 142   < > 142 144 139 141  K 4.7   < > 4.8   < > 4.5   < > 4.2 3.3* 2.9* 5.0  CL 108*   < > 100   < > 105   < > 103 103 100 106  CO2 25   < > 28   < > 25   < > 27 29 28 25   GLUCOSE 103*   < > 177*   < > 119*   < > 170* 81 91 86  BUN 24   < > 40*   < > 27   < > 43* 35* 38* 38*  CALCIUM 8.4*   < > 8.4*   < > 8.4*   < > 8.3* 8.7* 8.2* 8.2*  CREATININE 1.78*   < > 2.41*   < > 2.35*   < > 2.64* 2.88* 2.95* 3.16*  GFRNONAA 35*   < > 24*   < > 25*   < > 22* 21* 21* 19*  GFRAA 41*  --  28*  --  29*  --  25*  --   --   --    < > = values in this interval not displayed.    LIVER FUNCTION TESTS: Recent Labs    12/19/19 1739 01/02/20 1632 02/10/2020 1319 01/15/20 0330  BILITOT 1.4* 1.8* 2.2* 1.9*  AST 43* 44* 37 35  ALT 39 25 21 20   ALKPHOS 62 79 68 62  PROT 6.6 6.4 7.2 6.4*  ALBUMIN 3.6 3.6* 3.5 3.0*    TUMOR MARKERS: No results for input(s): AFPTM, CEA, CA199, CHROMGRNA in the last 8760  hours.  Assessment and Plan:  L2 compression fracture: Hobie D.  Keyworth, 2040/06/18, is tentatively scheduled for an L2 kyphoplasty/vertebroplasty 11/9 or 11/10. Insurance authorization is still pending, patient's INR is not within an acceptable limit for this procedure. INR 5.2 this morning, patient did receive Vitamin K infusion. The patient and his wife are aware of the reasons behind the uncertainty of the timing for this procedure.   Risks and benefits of an L2 kyphoplasty/vertebroplasty were discussed with the patient and his wife including, but not limited to education regarding the natural healing process of compression fractures without intervention, bleeding, infection, cement migration which may cause spinal cord damage, paralysis, pulmonary embolism or even death.  This interventional procedure involves the use of X-rays and because of the nature of the planned procedure, it is possible that we will have prolonged use of X-ray fluoroscopy.  Potential radiation risks to you include (but are not limited to) the following: - A slightly elevated risk for cancer  several years later in life. This risk is typically less than 0.5% percent. This risk is low in comparison to the normal incidence of human cancer, which is 33% for women and 50% for men according to the [employer]. - Radiation induced injury can include skin redness, resembling a rash, tissue breakdown / ulcers and hair loss (which can be temporary or permanent).  The likelihood of either of these occurring depends on the difficulty of the procedure and whether you are sensitive to radiation due to previous procedures, disease, or genetic conditions.   IF your procedure requires a prolonged use of radiation, you will be notified and given written instructions for further action.  It is your responsibility to monitor the irradiated area for the 2 weeks following the procedure and to notify your physician if you are concerned that you have suffered a radiation induced injury.    All of the  patient's questions were answered, patient is agreeable to proceed.  When the timing of the procedure has been decided the patient will need to be NPO at midnight, AM labs will need to be ordered. The patient has not taken coumadin since prior to admission on 02/06/2020.  The consent has been signed and is in the [facility] office.   Thank you for this interesting consult.  I greatly enjoyed meeting Chon D Kmetz and look forward to participating in their care.  A copy of this report was sent to the requesting provider on this date.  Electronically Signed: Soyla Dryer, AGACNP-BC [phone] 01/16/2020, 1:04 PM   I spent a total of 55 Miinutes    in face to face in clinical consultation, greater than 50% of which was counseling/coordinating care for L2 kyphoplasty/vertebroplasty

## 2020-01-16 NOTE — Progress Notes (Signed)
CRITICAL VALUE ALERT  Critical Value:  INR 5.2  Date & Time Notied:  01/16/20 at 0430  Provider Notified: Kennon Holter  Orders Received/Actions taken: MD notified of value, but INR has been elevated.

## 2020-01-16 NOTE — Progress Notes (Signed)
Robert [name]  ZOX:[id number] DOB: Dec 08, 1940 DOA: 01/30/2020 PCP: Bonnita Nasuti, MD    Brief Narrative:  79yo with a history of DM2, chronic voiding difficulty followed at [facility], CVA, gastric cancer status post gastrectomy, rheumatic heart disease/severe mitral stenosis status post MVR 2015 on chronic warfarin, persistent atrial fibrillation, and systolic CHF with EF 81-19% who presented to the ED with worsening back pain after a recent mechanical fall.  At the time of his fall he was evaluated in the ED and found to have an L2 compression fracture.  He was able to be sent home but his pain has not been controlled with pain medications.  Significant Events:  11/2 admit via ED  Antimicrobials:  None  DVT prophylaxis: Warfarin  Subjective: Vital signs stable.  Blood pressure mildly elevated.  Creatinine continues to climb.  INR continues to climb and is at 5.2 today.  No new complaints today.  The patient is wife and I had a very lengthy discussion concerning his treatment course, with the majority of time focused around the need for a Foley catheter.  He has now consented to having a Foley placed.  Assessment & Plan:  Subacute L2 compression fracture status post mechanical fall -intractable low back pain IR following for possible kyphoplasty likely 11/9 or 11/10 - waiting to reverse coumadin/transition to heparin until closer to time of surgery - this should not be a limiting factor as I anticipate his INR will be easy to correct w/ tx   Acute kidney injury on CKD stage IIIb Baseline creatinine 1.19 May 2019 -creatinine 2.88 at presentation -CT noted increased bilateral perinephric inflammatory stranding and distended bladder but no evidence of hydronephrosis - suspect this is prerenal with poor oral intake in setting of narcotic use and uncontrolled pain -continue to hydrate and follow trend - avoid NSAIDs and IV contrast - with crt continuing to trend upward will ask  Nephrology to consult - place foley to assure bladder is fully decompressed   Persistent valvular atrial fibrillation Continue amiodarone and Coreg -holding Coumadin with supratherapeutic INR and possible need for procedure -establish therapeutic INR goal of 3-3.5   Coumadin induced coagulopathy INR supratherapeutic -holding Coumadin for now - goal was to allow to drift down into therapeutic range then transition to heparin when drops below therapeutic range but with INR climbing and possible gluetal hematoma on MRI will begin dosing with Vitamin K and close INR monitoring   Chronic systolic CHF No evidence of an acute exacerbation  Chronic constipation Due to narcotic use and low back injury - cont bowel regimen - allow pt to use Trulance from his own home supply   Hypokalemia > Hyperkalemia  Likely a consequence of poor oral intake - corrected w/ supplementation - follow trend w/ worsening renal fxn   Macrocytic anemia B12 and folate are not low -iron is normal  HTN Blood pressure reasonably controlled at this time  HLD Continue usual home medication  DM2 - diet controlled CBG well controlled  Code Status: FULL CODE Family Communication: Spoke for an extended period of time with patient and his wife Status is: Inpatient  Remains inpatient appropriate because:Ongoing active pain requiring inpatient pain management   Dispo: The patient is from: Home              Anticipated d/c is to: Home              Anticipated d/c date is: > 3 days  Patient currently is not medically stable to d/c.   Consultants:  IR  Objective: Blood pressure (!) 140/96, pulse 88, temperature 97.7 F (36.5 C), temperature source Oral, resp. rate 12, height 5\' 6"  (1.676 m), weight 54.9 kg, SpO2 98 %.  Intake/Output Summary (Last 24 hours) at 01/16/2020 0832 Last data filed at 01/16/2020 0226 Gross per 24 hour  Intake 946 ml  Output 1025 ml  Net -79 ml   Filed Weights   01/14/20  0515  Weight: 54.9 kg    Examination: General: No acute respiratory distress Lungs: CTA B without wheezing Cardiovascular: RRR without murmur or rub Abdomen: NT/ND, soft, bs+, no mass Extremities: No C/C/E B lower extremities  CBC: Recent Labs  Lab 01/24/2020 1319 01/15/20 0330 01/15/20 0934  WBC 6.5 5.0 5.4  HGB 10.8* 10.1* 10.1*  HCT 34.0* 31.3* 31.3*  MCV 105.9* 105.4* 103.0*  PLT 198 167 062   Basic Metabolic Panel: Recent Labs  Lab 01/20/2020 1319 01/31/2020 1600 01/15/20 0330 01/16/20 0339  NA 144  --  139 141  K 3.3*  --  2.9* 5.0  CL 103  --  100 106  CO2 29  --  28 25  GLUCOSE 81  --  91 86  BUN 35*  --  38* 38*  CREATININE 2.88*  --  2.95* 3.16*  CALCIUM 8.7*  --  8.2* 8.2*  MG  --  2.6*  --   --    GFR: Estimated Creatinine Clearance: 14.5 mL/min (A) (by C-G formula based on SCr of 3.16 mg/dL (H)).  Liver Function Tests: Recent Labs  Lab 02/01/2020 1319 01/15/20 0330  AST 37 35  ALT 21 20  ALKPHOS 68 62  BILITOT 2.2* 1.9*  PROT 7.2 6.4*  ALBUMIN 3.5 3.0*    Coagulation Profile: Recent Labs  Lab 01/09/20 0851 01/23/2020 1346 01/14/20 0412 01/15/20 0330 01/16/20 0339  INR 2.8 3.9* 4.1* 4.7* 5.2*    HbA1C: Hgb A1c MFr Bld  Date/Time Value Ref Range Status  01/15/2020 03:33 AM 5.1 4.8 - 5.6 % Final    Comment:    (NOTE) Pre diabetes:          5.7%-6.4%  Diabetes:              >6.4%  Glycemic control for   <7.0% adults with diabetes   12/02/2016 02:36 AM 6.0 (H) 4.8 - 5.6 % Final    Comment:    (NOTE) Pre diabetes:          5.7%-6.4% Diabetes:              >6.4% Glycemic control for   <7.0% adults with diabetes     Recent Results (from the past 240 hour(s))  Respiratory Panel by RT PCR (Flu A&B, Covid) - Nasopharyngeal Swab     Status: None   Collection Time: 02/01/2020  3:08 PM   Specimen: Nasopharyngeal Swab  Result Value Ref Range Status   SARS Coronavirus 2 by RT PCR NEGATIVE NEGATIVE Final    Comment: (NOTE) SARS-CoV-2  target nucleic acids are NOT DETECTED.  The SARS-CoV-2 RNA is generally detectable in upper respiratoy specimens during the acute phase of infection. The lowest concentration of SARS-CoV-2 viral copies this assay can detect is 131 copies/mL. A negative result does not preclude SARS-Cov-2 infection and should not be used as the sole basis for treatment or other patient management decisions. A negative result may occur with  improper specimen collection/handling, submission of specimen other than nasopharyngeal swab,  presence of viral mutation(s) within the areas targeted by this assay, and inadequate number of viral copies (<131 copies/mL). A negative result must be combined with clinical observations, patient history, and epidemiological information. The expected result is Negative.  Fact Sheet for Patients:  PinkCheek.be  Fact Sheet for Healthcare Providers:  GravelBags.it  This test is no t yet approved or cleared by the [place] FDA and  has been authorized for detection and/or diagnosis of SARS-CoV-2 by FDA under an Emergency Use Authorization (EUA). This EUA will remain  in effect (meaning this test can be used) for the duration of the COVID-19 declaration under Section 564(b)(1) of the Act, 21 U.S.C. section 360bbb-3(b)(1), unless the authorization is terminated or revoked sooner.     Influenza A by PCR NEGATIVE NEGATIVE Final   Influenza B by PCR NEGATIVE NEGATIVE Final    Comment: (NOTE) The Xpert Xpress SARS-CoV-2/FLU/RSV assay is intended as an aid in  the diagnosis of influenza from Nasopharyngeal swab specimens and  should not be used as a sole basis for treatment. Nasal washings and  aspirates are unacceptable for Xpert Xpress SARS-CoV-2/FLU/RSV  testing.  Fact Sheet for Patients: PinkCheek.be  Fact Sheet for Healthcare  Providers: GravelBags.it  This test is not yet approved or cleared by the [place] FDA and  has been authorized for detection and/or diagnosis of SARS-CoV-2 by  FDA under an Emergency Use Authorization (EUA). This EUA will remain  in effect (meaning this test can be used) for the duration of the  Covid-19 declaration under Section 564(b)(1) of the Act, 21  U.S.C. section 360bbb-3(b)(1), unless the authorization is  terminated or revoked. Performed at [facility], [place] [address] [place], [place] [address]      Scheduled Meds: . amiodarone  200 mg Oral Daily  . aspirin EC  81 mg Oral Daily  . atorvastatin  20 mg Oral QHS  . carvedilol  6.25 mg Oral BID WC  . hydrALAZINE  25 mg Oral BID  . lidocaine  1 patch Transdermal Q24H  . Plecanatide  1 tablet Oral Daily  . senna-docusate  1 tablet Oral BID  . sodium chloride flush  3 mL Intravenous Q12H  . tamsulosin  0.8 mg Oral Daily  . vitamin B-12  1,000 mcg Oral Daily     LOS: 3 days   Cherene Altes, MD [facility] Office  [phone] Pager - Text Page per Amion  If 7PM-7AM, please contact night-coverage per Amion 01/16/2020, 8:32 AM

## 2020-01-16 NOTE — Progress Notes (Signed)
Attempt to place foley catheter with NT and Jarrett Soho RN was unsuccessful, pt. refused to try reinsertion using a coude foley. MD was notified. Pt.keept warm and dry.

## 2020-01-16 NOTE — Progress Notes (Signed)
ANTICOAGULATION CONSULT NOTE - Initial Consult  Pharmacy Consult for: Heparin bridge one INR <3 (warfarin on hold) Indication: history of mitral valve replacement  Allergies  Allergen Reactions  . Penicillins Nausea And Vomiting    08/23/2018 pt has recently tolerated Amoxicillin per pt and wife Has patient had a PCN reaction causing immediate rash, facial/tongue/throat swelling, SOB or lightheadedness with hypotension: No Has patient had a PCN reaction causing severe rash involving mucus membranes or skin necrosis: No Has patient had a PCN reaction that required hospitalization: Unknown Has patient had a PCN reaction occurring within the last 10 years: No If all of the above answers are "NO", then may proceed with Cephalospor  . Vancomycin Other (See Comments)    Kidney problems    Patient Measurements: Height: 5\' 6"  Weight: 54.9 kg  Vital Signs: Temp: 97.7 F (36.5 C) (11/05 0410) Temp Source: Oral (11/05 0410) BP: 140/96 (11/05 0410) Pulse Rate: 88 (11/05 0410)  Labs: Recent Labs    01/27/2020 1319 01/27/2020 1319 01/17/2020 1346 01/14/20 0412 01/15/20 0330 01/15/20 0934 01/16/20 0339  HGB 10.8*   < >  --   --  10.1* 10.1*  --   HCT 34.0*  --   --   --  31.3* 31.3*  --   PLT 198  --   --   --  167 170  --   LABPROT  --   --    < > 38.2* 43.0*  --  46.3*  INR  --   --    < > 4.1* 4.7*  --  5.2*  CREATININE 2.88*  --   --   --  2.95*  --  3.16*   < > = values in this interval not displayed.    Estimated Creatinine Clearance: 14.5 mL/min (A) (by C-G formula based on SCr of 3.16 mg/dL (H)).   Medical History: Past Medical History:  Diagnosis Date  . BPH (benign prostatic hyperplasia)   . Chronic anticoagulation    coumadin for mechanical valves  . CKD (chronic kidney disease), stage III ([place])   . Diabetes mellitus type 2, diet-controlled ([place])   . Gastric cancer ([place]) 2005   s/p gastrectomy  . High cholesterol   . HOH (hard of hearing)   . Hypertension   .  Rheumatic heart disease   . S/P MVR (mitral valve replacement) 2015   86mm StJude mechanical valve  . Stroke [facility])     Medications: Warfarin PTA for mechanical mitral valve Home dose: Warfarin 1.5 mg PO daily Last dose PTA: 01/12/2020 at 2200  Assessment: 11 y/oM with PMH of rheumatic heart disease s/p mitral valve replacement, stroke on chronic warfarin anticoagulation presented to [facility] ED on 01/25/2020 with worsening back pain after a recent fall. CT abdomen pelvis negative for retroperitoneal hematoma. Planning for possible kyphoplasty - tentatively scheduled for 11/8.   Anticoagulation will be transitioned to heparin drip once INR becomes subtherapeutic in anticipation for procedure.  Baseline labs:   CBC: Hgb 10.8, Pltc 198K. INR supratherapeutic at 3.9.   Significant Events:  Last dose of warfarin: 11/1  Phytonadione 5 mg IV once on 11/5  Today, 01/16/20  INR = 5.2 remains supratherapeutic today. Increased despite warfarin being held for several days  No bleeding issues documented.   Phytonadione 5 mg IV once given today by MD for INR reversal  CBC: Hgb low but stable, Plt WNL. Last labs 11/5  No significant new DDI. Pt on amiodarone + warfarin PTA  Goal  of Therapy:  INR 3-3.5 (per Anticoagulation Clinic notes) Monitor platelets by anticoagulation protocol: Yes   Plan:   Continue to hold warfarin until cleared to resume after invasive procedures  INR supratherapeutic. No heparin drip at this time. Will recheck INR this evening s/p vitamin K reversal  Initiate heparin drip once INR <3  INR, CBC daily  Lenis Noon, PharmD 01/16/20 12:25 PM

## 2020-01-16 NOTE — Progress Notes (Signed)
MD, Patient reports that his GI doctor gave him a sample of a medication called Trulance. Patient would like to take this medication while in the hospital to help with constipation.Robert [name]

## 2020-01-16 NOTE — Consult Note (Signed)
Renal Service Consult Note [facility]  Robert [name] 01/16/2020 Robert Blazing, MD Requesting Physician:  Dr Thereasa Solo  Reason for Consult:  Renal failure HPI: The patient is a 79 y.o. 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 He had recently been seen in ED for L2 comp fracture. In ED CT abd showed bilat perinephric inflammatory changes, distended bladder w/ diverticula, bilat GG nodules in the lungs. Creat was up to 2.8 , baseline 1.5- 1.9.  Pt admitted.  Since admit creat worse up to 3.16, asked to see for renal failure.    Pt seen in room.  He recently saw a urologist who told him that his and enlarge bladder and that his  "bladder doesn't work", and that normally they would fix it but "he is too sick for that".  He says he was constipated recently because of pain meds for injuries from falling and lost 10 lbs from 1230 > 120 lbs. He says the amount of swelling in his legs is about normal for him.  He has been eating but not as much as usual.  No SOB , orthopnea. No cough or f/c/s.  Voiding is slow, not delayed bladder empyting.    BP's here have been normal to high, lowest 120's.  CT and MRI done w/o contrast. He did get 47m po lasix on 11/2 and once on 11/3, then dc'd. Getting home BP meds, no acei/ ARB. Has been getting IV and po K replacements. No nsaids. Getting oxy po and tramadol.  I/O showed 1025 cc UOP yesterday.   He got I/O cath here yesterday, not sure the results will ask RN.      ROS  denies CP  no joint pain   no HA  no blurry vision  no rash  no diarrhea  no nausea/ vomiting     Past Medical History  Past Medical History:  Diagnosis Date  . BPH (benign prostatic hyperplasia)   . Chronic anticoagulation    coumadin for mechanical valves  . CKD (chronic kidney disease), stage III ([place]   . Diabetes mellitus type 2, diet-controlled  ([place]   . Gastric cancer ([place] 2005   s/p gastrectomy  . High cholesterol   . HOH (hard of hearing)   . Hypertension   . Rheumatic heart disease   . S/P MVR (mitral valve replacement) 2015   [id number] mechanical valve  . [facility]   Past Surgical History  Past Surgical History:  Procedure Laterality Date  . CARDIAC CATHETERIZATION N/A 05/10/2015   Procedure: Right/Left Heart Cath and Coronary Angiography;  Surgeon: ChBurnell BlanksMD;  [facility];  Service: Cardiovascular;  Laterality: N/A;  . [place]  . IR ANGIO INTRA EXTRACRAN SEL COM CAROTID INNOMINATE BILAT MOD SED  12/01/2016  . IR ANGIO VERTEBRAL SEL SUBCLAVIAN INNOMINATE UNI R MOD SED  12/01/2016  . MITRAL VALVE REPLACEMENT  10/24/2013   [id number] mechanical valve  . PARTIAL GASTRECTOMY  2005  . SMALL BOWEL [facility]OS[facility]  [facility] Family History  Family History  Problem Relation Age of Onset  . Hypertension Mother   . Diabetes Mother   . Cancer Brother   . Colon cancer Neg Hx   . Esophageal cancer  Neg Hx    Social History  reports that he has never smoked. He has never used smokeless tobacco. He reports that he does not drink alcohol and does not use drugs. Allergies  Allergies  Allergen Reactions  . Penicillins Nausea And Vomiting    08/23/2018 pt has recently tolerated Amoxicillin per pt and wife Has patient had a PCN reaction causing immediate rash, facial/tongue/throat swelling, SOB or lightheadedness with hypotension: No Has patient had a PCN reaction causing severe rash involving mucus membranes or skin necrosis: No Has patient had a PCN reaction that required hospitalization: Unknown Has patient had a PCN reaction occurring within the last 10 years: No If all of the above answers are "NO", then may proceed with Cephalospor  . Vancomycin Other (See Comments)    Kidney problems   Home medications Prior to  Admission medications   Medication Sig Start Date End Date Taking? Authorizing Provider  amiodarone (PACERONE) 200 MG tablet Take 1 tablet (200 mg total) by mouth 2 (two) times daily for 14 days, THEN 1 tablet (200 mg total) daily. Patient taking differently: Take 200 mg by mouth daily. 10/30/19 11/12/20 Yes HiltyNadean Corwin, MD  aspirin EC 81 MG tablet Take 81 mg by mouth daily.   Yes [provider]  atorvastatin (LIPITOR) 20 MG tablet TAKE 1 TABLET BY MOUTH EVERYDAY AT BEDTIME Patient taking differently: Take 20 mg by mouth daily.  12/30/19  Yes Hilty, Nadean Corwin, MD  calcium carbonate (OSCAL) 1500 (600 Ca) MG TABS tablet Take 1,500 mg by mouth daily with breakfast.   Yes [provider]  carvedilol (COREG) 6.25 MG tablet Take 2 tablets (12.5 mg total) by mouth every morning AND 1 tablet (6.25 mg total) every evening. 12.5 mg in the morning and 6.80m in the evening) (2 tablets in the Morning and 1 Tablet in the Evening). Patient taking differently: Takes 2 tablets (12.5 mg) in the morning and 1 tablet (6.25 mg) in the evening. 10/21/19  Yes CDeberah Pelton NP  ferrous sulfate 325 (65 FE) MG tablet Take 1 tablet (325 mg total) by mouth 2 (two) times daily with a meal. 05/13/15  Yes Regalado, Belkys A, MD  furosemide (LASIX) 20 MG tablet Take 2 tablets (45m by mouth in the morning and 1 tablet (2057min the afternoon Patient taking differently: Take 20-40 mg by mouth See admin instructions. Take 2 tablets (100m70my mouth in the morning and 1 tablet (20mg75m the afternoon 12/16/19  Yes Hilty, KenneNadean Corwin hydrALAZINE (APRESOLINE) 25 MG tablet TAKE 1 TABLET BY MOUTH TWICE A DAY Patient taking differently: Take 25 mg by mouth in the morning and at bedtime.  12/25/19  Yes Hilty, KenneNadean Corwin lidocaine (LIDODERM) 5 % Place 1 patch onto the skin daily. Remove & Discard patch within 12 hours or as directed by MD Patient taking differently: Place 1 patch onto the skin daily.  12/19/19   Yes Rees,Quintella Reichert Multiple Vitamin (MULTIVITAMIN WITH MINERALS) TABS tablet Take 1 tablet by mouth daily.   Yes [provider]  Plecanatide (TRULANCE) 3 MG TABS Take 1 tablet by mouth daily. 01/09/20  Yes GuptaJackquline Denmark senna-docusate (SENOKOT-S) 8.6-50 MG tablet Take 1 tablet by mouth at bedtime as needed for mild constipation or moderate constipation. 01/03/20  Yes Long, JoshuWonda Olds tamsulosin (FLOMAX) 0.4 MG CAPS capsule Take 0.8 mg by mouth daily.   Yes [provider]  traMADol (ULTRAM) 50 MG  tablet Take 50 mg by mouth in the morning, at noon, and at bedtime.    Yes [provider]  vitamin B-12 (CYANOCOBALAMIN) 1000 MCG tablet Take 1 tablet (1,000 mcg total) by mouth daily. 05/13/15  Yes Regalado, Belkys A, MD  warfarin (COUMADIN) 3 MG tablet TAKE 1 TO 1 & 1/2 TABLETS BY MOUTH DAILY AS DIRECTED BY COUMADIN CLINIC Patient taking differently: Take 1.5 mg by mouth daily.  01/06/20  Yes Hilty, Nadean Corwin, MD  diclofenac Sodium (VOLTAREN) 1 % GEL Apply 2 g topically 4 (four) times daily as needed. Patient not taking: Reported on 02/10/2020 01/03/20   Margette Fast, MD     Vitals:   01/15/20 1427 01/15/20 1951 01/16/20 0200 01/16/20 0410  BP: 139/79 (!) 150/91  (!) 140/96  Pulse: (!) 101 63  88  Resp: 16 15  12   Temp: 98.1 F (36.7 C) 98 F (36.7 C)  97.7 F (36.5 C)  TempSrc: Oral   Oral  SpO2: 98% 96%  98%  Weight:      Height:   5' 6"  (1.676 m)    Exam Gen alert, no distress, frail elderly male No rash, cyanosis or gangrene Sclera anicteric, throat clear  No jvd or bruits Chest clear on R, mild rales L base Cor irreg no RG Abd soft ntnd no mass or ascites +bs, no tenderness over bladder GU normal male MS no joint effusions or deformity Ext 1+ bilat ankle edema, no other edema Neuro is alert, Ox 3 , nf, gen weakness mild    Home meds:  - amiodarone/ asa / lipitor qd/ coreg bid/ lasix 40am+ 20pm/ hydralazine 25 bid  - warfarin qd  -  flomax qd/ ultram 50 tid/ trulance qd  - prn's/ vitamins/ supplements       Date   Creat  eGFR (ml/min)    2017 - July 2021 1.5- 1.9 39- 50    Sept 2021  2.41     Oct 2021  2.35- 2.64    Nov 2  2.88    Nov 4  2.95    Jan 16, 2020  3.16  19     UA 10/8 - negative   UA here - pend   Renal US - pend    UNa, UCr - pend     Assessment/ Plan: 1. AoCKD 3a - b/l creat 1.5- 1.9 from mid 2021, eGFR 39- 50.  Creat rising the last 2-3 mos. Has sig problems w/ CHF but is followed closely by Dr Debara Pickett and looks euvolemic today w/ mild ankle edema. He reportedly has a bladder that "doesn't work" per recent urology visit. Not sure cause of AoCKD at this time, suggest bladder scans, renal US, consider foley placement. Get CXR. Po lasix on hold, agree, no sig vol excess. BP's controlled. Get UA, urine lytes as well. Will follow.  2. Fall/ L2 compression fracture - on pain meds per pmd 3. CHF - chronic syst HF, last EF 35-40% in August. F/b Dr Debara Pickett, on lasix, coreg at home.  4. HTN - cont meds coreg /hydralazine, BP's good 5. SP MVR - on coumadin 6. Permanent atrial fib - on a/c, amio, BB      Rob Frankee Gritz  MD 01/16/2020, 10:53 AM  Recent Labs  Lab 01/15/20 0330 01/15/20 0934  WBC 5.0 5.4  HGB 10.1* 10.1*   Recent Labs  Lab 01/15/20 0330 01/16/20 0339  K 2.9* 5.0  BUN 38* 38*  CREATININE 2.95* 3.16*  CALCIUM  8.2* 8.2*

## 2020-01-16 NOTE — Progress Notes (Signed)
Martin for: Heparin bridge one INR <3 (warfarin on hold) Indication: history of mitral valve replacement  Allergies  Allergen Reactions  . Penicillins Nausea And Vomiting    08/23/2018 pt has recently tolerated Amoxicillin per pt and wife Has patient had a PCN reaction causing immediate rash, facial/tongue/throat swelling, SOB or lightheadedness with hypotension: No Has patient had a PCN reaction causing severe rash involving mucus membranes or skin necrosis: No Has patient had a PCN reaction that required hospitalization: Unknown Has patient had a PCN reaction occurring within the last 10 years: No If all of the above answers are "NO", then may proceed with Cephalospor  . Vancomycin Other (See Comments)    Kidney problems    Patient Measurements: Height: 5\' 6"  Weight: 54.9 kg  Vital Signs: Temp: 97.3 F (36.3 C) (11/05 2053) Temp Source: Oral (11/05 2053) BP: 150/80 (11/05 2053) Pulse Rate: 63 (11/05 2053)  Labs: Recent Labs    01/15/20 0330 01/15/20 0934 01/16/20 0339 01/16/20 1954  HGB 10.1* 10.1*  --   --   HCT 31.3* 31.3*  --   --   PLT 167 170  --   --   LABPROT 43.0*  --  46.3* 25.0*  INR 4.7*  --  5.2* 2.4*  CREATININE 2.95*  --  3.16*  --     Estimated Creatinine Clearance: 14.5 mL/min (A) (by C-G formula based on SCr of 3.16 mg/dL (H)).   Medical History: Past Medical History:  Diagnosis Date  . BPH (benign prostatic hyperplasia)   . Chronic anticoagulation    coumadin for mechanical valves  . CKD (chronic kidney disease), stage III ([place])   . Diabetes mellitus type 2, diet-controlled ([place])   . Gastric cancer ([place]) 2005   s/p gastrectomy  . High cholesterol   . HOH (hard of hearing)   . Hypertension   . Rheumatic heart disease   . S/P MVR (mitral valve replacement) 2015   64mm StJude mechanical valve  . Stroke [facility])     Medications: Warfarin PTA for mechanical mitral valve Home dose: Warfarin 1.5 mg PO  daily Last dose PTA: 01/12/2020 at 2200  Assessment: 76 y/oM with PMH of rheumatic heart disease s/p mitral valve replacement, stroke on chronic warfarin anticoagulation presented to [facility] ED on 01/21/2020 with worsening back pain after a recent fall. CT abdomen pelvis negative for retroperitoneal hematoma. Planning for kyphoplasty - tentatively scheduled for 11/9 or 11/10.   Anticoagulation will be transitioned to heparin drip once INR becomes subtherapeutic in anticipation for procedure.  Baseline labs:   CBC: Hgb 10.8, Pltc 198K. INR supratherapeutic at 3.9.   Significant Events:  Last dose of warfarin: 11/1  Vitamin K 5 mg IV once on 11/5 at 1014  Today, 01/16/20  PM INR = 2.4, down from 5.2 after Vitamin K administration earlier today, now lower than goal range   CBC: Hgb low but stable, Plt WNL. Last labs 11/4  No significant new DDI. Pt on amiodarone + warfarin PTA.  MRI revealed possible small gluteal hematoma   Goal of Therapy:  INR 3-3.5 (per Anticoagulation Clinic notes)  Heparin level 0.3-0.5 units/ml Monitor platelets by anticoagulation protocol: Yes   Plan:   Continue to hold warfarin until cleared to resume after invasive procedures  Per discussion with Lovey Newcomer, NP, okay to start heparin infusion with targeting lower end of goal range and monitoring closely for worsening of hematoma  Start heparin infusion at 800 units/hr  Heparin  level 8 hours after initiation   Daily CBC, heparin level, INR  Monitor for worsening of hematoma or other bleeding issues    Lindell Spar, PharmD, BCPS Clinical Pharmacist  01/16/2020 10:03 PM

## 2020-01-17 ENCOUNTER — Other Ambulatory Visit: Payer: Self-pay | Admitting: General Practice

## 2020-01-17 DIAGNOSIS — M549 Dorsalgia, unspecified: Secondary | ICD-10-CM | POA: Diagnosis not present

## 2020-01-17 LAB — HEPARIN LEVEL (UNFRACTIONATED)
Heparin Unfractionated: 0.77 IU/mL — ABNORMAL HIGH (ref 0.30–0.70)
Heparin Unfractionated: 0.66 IU/mL (ref 0.30–0.70)

## 2020-01-17 LAB — GLUCOSE, CAPILLARY
Glucose-Capillary: 143 mg/dL — ABNORMAL HIGH (ref 70–99)
Glucose-Capillary: 91 mg/dL (ref 70–99)
Glucose-Capillary: 97 mg/dL (ref 70–99)
Glucose-Capillary: 96 mg/dL (ref 70–99)

## 2020-01-17 LAB — RENAL FUNCTION PANEL
Albumin: 3.1 g/dL — ABNORMAL LOW (ref 3.5–5.0)
Anion gap: 7 (ref 5–15)
BUN: 40 mg/dL — ABNORMAL HIGH (ref 8–23)
CO2: 28 mmol/L (ref 22–32)
Calcium: 8.1 mg/dL — ABNORMAL LOW (ref 8.9–10.3)
Chloride: 105 mmol/L (ref 98–111)
Creatinine, Ser: 3.17 mg/dL — ABNORMAL HIGH (ref 0.61–1.24)
GFR, Estimated: 19 mL/min — ABNORMAL LOW (ref >60)
Glucose, Bld: 115 mg/dL — ABNORMAL HIGH (ref 70–99)
Phosphorus: 3.5 mg/dL (ref 2.5–4.6)
Potassium: 4.6 mmol/L (ref 3.5–5.1)
Sodium: 140 mmol/L (ref 135–145)

## 2020-01-17 LAB — CBC
HCT: 33.4 % — ABNORMAL LOW (ref 39.0–52.0)
Hemoglobin: 10.2 g/dL — ABNORMAL LOW (ref 13.0–17.0)
MCH: 32.7 pg (ref 26.0–34.0)
MCHC: 30.5 g/dL (ref 30.0–36.0)
MCV: 107.1 fL — ABNORMAL HIGH (ref 80.0–100.0)
Platelets: 162 10*3/uL (ref 150–400)
RBC: 3.12 MIL/uL — ABNORMAL LOW (ref 4.22–5.81)
RDW: 19.2 % — ABNORMAL HIGH (ref 11.5–15.5)
WBC: 6.9 10*3/uL (ref 4.0–10.5)
nRBC: 0 % (ref 0.0–0.2)

## 2020-01-17 LAB — PROTIME-INR
INR: 1.8 — ABNORMAL HIGH (ref 0.8–1.2)
Prothrombin Time: 20.5 seconds — ABNORMAL HIGH (ref 11.4–15.2)

## 2020-01-17 MED ORDER — LIP MEDEX EX OINT
TOPICAL_OINTMENT | CUTANEOUS | Status: AC
  Filled 2020-01-17: qty 7

## 2020-01-17 MED ORDER — HEPARIN (PORCINE) 25000 UT/250ML-% IV SOLN: 700.0000 [IU]/h | INTRAVENOUS | Status: DC

## 2020-01-17 MED ORDER — HEPARIN (PORCINE) 25000 UT/250ML-% IV SOLN
600.0000 [IU]/h | INTRAVENOUS | Status: DC
  Filled 2020-01-17: qty 250

## 2020-01-17 MED ORDER — SODIUM CHLORIDE 0.9 % IV SOLN: INTRAVENOUS | Status: AC

## 2020-01-17 NOTE — Progress Notes (Signed)
Have alerted MD to pt having another small void with bright red blood. Smaller amount than the one time with morning void. Pt alert and states he feels fine. Heparin drip remains at 7. MD returned chat, and he is aware. Close monitoring continues.

## 2020-01-17 NOTE — Progress Notes (Signed)
Robert [name]  PRF:[id number] DOB: [birth year] DOA: 01/16/2020 PCP: Bonnita Nasuti, MD    Brief Narrative:  79yo with a history of DM2, chronic voiding difficulty followed at [facility]rology, CVA, gastric cancer status post gastrectomy, rheumatic heart disease/severe mitral stenosis status post MVR 2015 on chronic warfarin, persistent atrial fibrillation, and systolic CHF with EF 93-57% who presented to the ED with worsening back pain after a recent mechanical fall.  At the time of his fall he was evaluated in the ED and found to have an L2 compression fracture.  He was able to be sent home but his pain has not been controlled with pain medications.  Significant Events:  11/2 admit via ED  Antimicrobials:  None  DVT prophylaxis: Warfarin  Subjective: Afebrile.  Blood pressure modestly elevated.  Saturations 98% on room air.  When attempting to place a Foley yesterday the nurses met significant resistance.  The patient refused repeat attempt using a coud catheter.  This morning he did have an episode of hematuria/passing significant blood clots.  He has not had any symptoms to suggest retention.  Serial bladder scans are being accomplished.  Patient has no new complaints today.  He states his pain is reasonably well controlled.  He denies chest pain or shortness of breath.  The patient tells me he is due for a follow-up carotid Doppler evaluation from a prior stroke and ask if we can accomplish that while he is here.  Assessment & Plan:  Subacute L2 compression fracture status post mechanical fall -intractable low back pain IR following for L2 kyphoplasty likely 11/9 or 11/10 - INR will be 1.4 or < in time for the procedure  Acute kidney injury on CKD stage IIIb Baseline creatinine 1.19 May 2019 - creatinine 2.88 at presentation -CT noted increased bilateral perinephric inflammatory stranding and distended bladder but no evidence of hydronephrosis - suspect this is prerenal with  poor oral intake in setting of narcotic use and uncontrolled pain -continue to hydrate and follow trend - avoid NSAIDs and IV contrast - Nephrology following - Foley placement was attempted 11/5 but resistance was met and patient refused reattempt with coud catheter  Recent Labs  Lab 01/18/2020 1319 01/15/20 0330 01/16/20 0339 01/17/20 1035  CREATININE 2.88* 2.95* 3.16* 3.17*     Persistent valvular atrial fibrillation Continue amiodarone and Coreg -holding Coumadin with supratherapeutic INR and possible need for procedure -establish therapeutic INR goal of 3-3.5 -rate controlled  Recent Labs  Lab 01/14/20 0412 01/15/20 0330 01/16/20 0339 01/16/20 1954 01/17/20 1035  INR 4.1* 4.7* 5.2* 2.4* 1.8*    Coumadin induced coagulopathy holding Coumadin for now - goal was to allow to drift down into therapeutic range then transition to heparin when drops below therapeutic range but with INR climbing and possible gluetal hematoma on MRI was dosed with Vitamin K 5 mg x 1 11/6 with INR improved significantly - initiate heparin now that patient below therapeutic range and monitor INR  Chronic systolic CHF Appears euvolemic at present  [employer]   01/14/20 0515  Weight: 54.9 kg    Chronic constipation Due to narcotic use and low back injury - cont bowel regimen - allow pt to use Trulance from his own home supply   Hypokalemia > Hyperkalemia  Likely a consequence of poor oral intake - corrected w/ supplementation - K+ stable at this time   Macrocytic anemia B12 and folate are not low - iron is normal  HTN Blood pressure trending upward -  follow trend for now without change in treatment  HLD Continue usual home medication  DM2 - diet controlled CBG well controlled  Code Status: FULL CODE Family Communication: Spoke for an extended period of time with patient and his wife Status is: Inpatient  Remains inpatient appropriate because:Ongoing active pain requiring inpatient  pain management   Dispo: The patient is from: Home              Anticipated d/c is to: Home              Anticipated d/c date is: > 3 days              Patient currently is not medically stable to d/c.   Consultants:  IR  Objective: Blood pressure 131/76, pulse 62, temperature 97.7 F (36.5 C), temperature source Oral, resp. rate 16, height 5' 6"  (1.676 m), weight 54.9 kg, SpO2 97 %.  Intake/Output Summary (Last 24 hours) at 01/17/2020 1603 Last data filed at 01/17/2020 0600 Gross per 24 hour  Intake 120 ml  Output 350 ml  Net -230 ml   Filed Weights   01/14/20 0515  Weight: 54.9 kg    Examination: General: No acute respiratory distress - a&ox4 Lungs: CTA B - no wheezing  Cardiovascular: RRR - no M or rub  Abdomen: NT/ND, soft, bs+, no mass Extremities: No C/C/E B LE  CBC: Recent Labs  Lab 01/15/20 0330 01/15/20 0934 01/17/20 1035  WBC 5.0 5.4 6.9  HGB 10.1* 10.1* 10.2*  HCT 31.3* 31.3* 33.4*  MCV 105.4* 103.0* 107.1*  PLT 167 170 973   Basic Metabolic Panel: Recent Labs  Lab 01/12/2020 1319 02/04/2020 1600 01/15/20 0330 01/16/20 0339 01/17/20 1035  NA   < >  --  139 141 140  K   < >  --  2.9* 5.0 4.6  CL   < >  --  100 106 105  CO2   < >  --  28 25 28   GLUCOSE   < >  --  91 86 115*  BUN   < >  --  38* 38* 40*  CREATININE   < >  --  2.95* 3.16* 3.17*  CALCIUM   < >  --  8.2* 8.2* 8.1*  MG  --  2.6*  --   --   --   PHOS  --   --   --   --  3.5   < > = values in this interval not displayed.   GFR: Estimated Creatinine Clearance: 14.4 mL/min (A) (by C-G formula based on SCr of 3.17 mg/dL (H)).  Liver Function Tests: Recent Labs  Lab 01/14/2020 1319 01/15/20 0330 01/17/20 1035  AST 37 35  --   ALT 21 20  --   ALKPHOS 68 62  --   BILITOT 2.2* 1.9*  --   PROT 7.2 6.4*  --   ALBUMIN 3.5 3.0* 3.1*    Coagulation Profile: Recent Labs  Lab 01/14/20 0412 01/15/20 0330 01/16/20 0339 01/16/20 1954 01/17/20 1035  INR 4.1* 4.7* 5.2* 2.4* 1.8*     HbA1C: Hgb A1c MFr Bld  Date/Time Value Ref Range Status  01/15/2020 03:33 AM 5.1 4.8 - 5.6 % Final    Comment:    (NOTE) Pre diabetes:          5.7%-6.4%  Diabetes:              >6.4%  Glycemic control for   <7.0% adults with diabetes  12/02/2016 02:36 AM 6.0 (H) 4.8 - 5.6 % Final    Comment:    (NOTE) Pre diabetes:          5.7%-6.4% Diabetes:              >6.4% Glycemic control for   <7.0% adults with diabetes     Recent Results (from the past 240 hour(s))  Respiratory Panel by RT PCR (Flu A&B, Covid) - Nasopharyngeal Swab     Status: None   Collection Time: 02/10/2020  3:08 PM   Specimen: Nasopharyngeal Swab  Result Value Ref Range Status   SARS Coronavirus 2 by RT PCR NEGATIVE NEGATIVE Final    Comment: (NOTE) SARS-CoV-2 target nucleic acids are NOT DETECTED.  The SARS-CoV-2 RNA is generally detectable in upper respiratoy specimens during the acute phase of infection. The lowest concentration of SARS-CoV-2 viral copies this assay can detect is 131 copies/mL. A negative result does not preclude SARS-Cov-2 infection and should not be used as the sole basis for treatment or other patient management decisions. A negative result may occur with  improper specimen collection/handling, submission of specimen other than nasopharyngeal swab, presence of viral mutation(s) within the areas targeted by this assay, and inadequate number of viral copies (<131 copies/mL). A negative result must be combined with clinical observations, patient history, and epidemiological information. The expected result is Negative.  Fact Sheet for Patients:  PinkCheek.be  Fact Sheet for Healthcare Providers:  GravelBags.it  This test is no t yet approved or cleared by the [place] FDA and  has been authorized for detection and/or diagnosis of SARS-CoV-2 by FDA under an Emergency Use Authorization (EUA). This EUA will remain   in effect (meaning this test can be used) for the duration of the COVID-19 declaration under Section 564(b)(1) of the Act, 21 U.S.C. section 360bbb-3(b)(1), unless the authorization is terminated or revoked sooner.     Influenza A by PCR NEGATIVE NEGATIVE Final   Influenza B by PCR NEGATIVE NEGATIVE Final    Comment: (NOTE) The Xpert Xpress SARS-CoV-2/FLU/RSV assay is intended as an aid in  the diagnosis of influenza from Nasopharyngeal swab specimens and  should not be used as a sole basis for treatment. Nasal washings and  aspirates are unacceptable for Xpert Xpress SARS-CoV-2/FLU/RSV  testing.  Fact Sheet for Patients: PinkCheek.be  Fact Sheet for Healthcare Providers: GravelBags.it  This test is not yet approved or cleared by the [place] FDA and  has been authorized for detection and/or diagnosis of SARS-CoV-2 by  FDA under an Emergency Use Authorization (EUA). This EUA will remain  in effect (meaning this test can be used) for the duration of the  Covid-19 declaration under Section 564(b)(1) of the Act, 21  U.S.C. section 360bbb-3(b)(1), unless the authorization is  terminated or revoked. Performed at [facility], [place] [address], [place], [place] [address]      Scheduled Meds: . amiodarone  200 mg Oral Daily  . aspirin EC  81 mg Oral Daily  . atorvastatin  20 mg Oral QHS  . carvedilol  6.25 mg Oral BID WC  . hydrALAZINE  25 mg Oral BID  . lidocaine  1 patch Transdermal Q24H  . Plecanatide  1 tablet Oral Daily  . senna-docusate  1 tablet Oral BID  . sodium chloride flush  3 mL Intravenous Q12H  . tamsulosin  0.8 mg Oral Daily  . vitamin B-12  1,000 mcg Oral Daily     LOS: 4 days  Cherene Altes, MD [facility] Office  [phone] Pager - Text Page per Amion  If 7PM-7AM, please contact night-coverage per Amion 01/17/2020, 4:03 PM

## 2020-01-17 NOTE — Progress Notes (Signed)
Pt had bright red blood on the lid of the urinal, on the bedpad, and around penis and hands, when he voided this morning. Dr. Jonnie Finner in room and made aware. Bladder scanner done and showed 167 to 238ml of urine in bladder. Will watch for any blood next time pt voids, and in between. A Foley catheter was reported to be attempted last evening and was unable to advance. Pt continues on the Heparin drip. Will alert Dr. Thereasa Solo on rounds.

## 2020-01-17 NOTE — Progress Notes (Signed)
[facility] Progress Note  Subjective: UOP 400 cc yest, BP's high-normal. Creat stable 3.1 today.  Staff tried foley placement yest but couldn't get it in. Today has passed some bloody urine. Had 160 cc voided this am, bladder scan now is ~200.   Vitals:   01/16/20 0200 01/16/20 0410 01/16/20 1330 01/16/20 2053  BP:  (!) 140/96 109/77 (!) 150/80  Pulse:  88 96 63  Resp:  12 20 14   Temp:  97.7 F (36.5 C) 97.9 F (36.6 C) (!) 97.3 F (36.3 C)  TempSrc:  Oral  Oral  SpO2:  98% 97% 98%  Weight:      Height: 5' 6"  (1.676 m)       Exam: Gen alert, frail elderly male No jvd or bruits Chest occ rales R base, L basilar rales 1/3 up Cor irreg no RG Abd soft ntnd no mass or ascites +bs Ext 1+ bilat ankle edema Neuro is alert, Ox 3 , nf    Home meds:  - amiodarone/ asa / lipitor qd/ coreg bid/ lasix 40am+ 20pm/ hydralazine 25 bid  - warfarin qd  - flomax qd/ ultram 50 tid/ trulance qd  - prn's/ vitamins/ supplements       Date                          Creat               eGFR (ml/min)    2017 - July 2021      1.5- 1.9            39- 50    Sept 2021                 2.41         Oct 2021                  2.35- 2.64    Nov 2                        2.88    Nov 4                        2.95    Jan 16, 2020              3.16                 19     UA 10/8 - negative   UA 11/05 - 0-5 rbc/ wbc, neg protein, clear   Renal US - 9.2/ 9.6 cm kidneys w/o hydro, ^echo bilat    UNa 28, UCr 89   CXR 11/05 - mild atx and mild vasc congestion   Assessment/ Plan: 1. AoCKD 3a - b/l creat 1.5- 1.9 from mid 2021, eGFR 39- 50.  Creat rising over the last 2-3 mos. Has sig problems w/ CHF f/b Dr Debara Pickett. CXR no sig CHF, home lasix on hold. Creat stable 3.1 today, RUS shows chronic renal disease. UA negative. Possible bladder retention, do serial bladder scans. Will give gentle IVF"s. F/u lab in am.  2. Bladder retention - pt seen by urology , family says they were told he has  "bladder problems", not sure of details. Do serial bladder scans, this could be contributing to #1 above.  3. Fall/ L2 compression fracture - on pain meds per pmd 4. CHF - chronic syst HF, last  EF 35-40% in August. F/b Dr Debara Pickett, on lasix, coreg at home.  5. HTN - cont meds coreg /hydralazine, BP's good 6. 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 Permanent atrial fib - on a/c, amio, BB        Rob Kayvon Mo 01/17/2020, 9:16 AM   Recent Labs  Lab 01/15/20 0330 01/15/20 0934 01/16/20 0339  K 2.9*  --  5.0  BUN 38*  --  38*  CREATININE 2.95*  --  3.16*  CALCIUM 8.2*  --  8.2*  HGB 10.1* 10.1*  --    Inpatient medications: . amiodarone  200 mg Oral Daily  . aspirin EC  81 mg Oral Daily  . atorvastatin  20 mg Oral QHS  . carvedilol  6.25 mg Oral BID WC  . hydrALAZINE  25 mg Oral BID  . lidocaine  1 patch Transdermal Q24H  . Plecanatide  1 tablet Oral Daily  . senna-docusate  1 tablet Oral BID  . sodium chloride flush  3 mL Intravenous Q12H  . tamsulosin  0.8 mg Oral Daily  . vitamin B-12  1,000 mcg Oral Daily   . heparin 800 Units/hr (01/16/20 2310)   acetaminophen, bisacodyl, magnesium citrate, morphine injection, oxyCODONE, polyethylene glycol, traMADol

## 2020-01-17 NOTE — Progress Notes (Signed)
Pharmacy: [facility]  Pt's an 79 y.o currently on heparin drip for hx MVR and stroke while warfarin is on hold in anticipation for kyphoplasty procedure.    - heparin level is supra-therapeutic at 0.66 with current rate of 700 units/hr - He continues to have hematuria.   Goal of Therapy:  INR 3-3.5 (per Anticoagulation Clinic notes)  Heparin level 0.3-0.5 units/ml Monitor platelets by anticoagulation protocol: Yes  Plan: - decrease heparin drip to 600 units/hr - check 8 hr heparin level - monitor for severity of hematuria  Dia Sitter, PharmD, BCPS 01/17/2020 8:58 PM

## 2020-01-17 NOTE — Progress Notes (Signed)
[place] for: Heparin bridge one INR <3 (warfarin on hold) Indication: history of mitral valve replacement  Allergies  Allergen Reactions  . Penicillins Nausea And Vomiting    08/23/2018 pt has recently tolerated Amoxicillin per pt and wife Has patient had a PCN reaction causing immediate rash, facial/tongue/throat swelling, SOB or lightheadedness with hypotension: No Has patient had a PCN reaction causing severe rash involving mucus membranes or skin necrosis: No Has patient had a PCN reaction that required hospitalization: Unknown Has patient had a PCN reaction occurring within the last 10 years: No If all of the above answers are "NO", then may proceed with Cephalospor  . Vancomycin Other (See Comments)    Kidney problems    Patient Measurements: Height: 5\' 6"  Weight: 54.9 kg  Vital Signs:    Labs: Recent Labs    01/15/20 0330 01/15/20 0330 01/15/20 0934 01/16/20 0339 01/16/20 1954 01/17/20 1035  HGB 10.1*   < > 10.1*  --   --  10.2*  HCT 31.3*  --  31.3*  --   --  33.4*  PLT 167  --  170  --   --  162  LABPROT 43.0*   < >  --  46.3* 25.0* 20.5*  INR 4.7*   < >  --  5.2* 2.4* 1.8*  HEPARINUNFRC  --   --   --   --   --  0.77*  CREATININE 2.95*  --   --  3.16*  --  3.17*   < > = values in this interval not displayed.    Estimated Creatinine Clearance: 14.4 mL/min (A) (by C-G formula based on SCr of 3.17 mg/dL (H)).  Medications: Warfarin PTA for mechanical mitral valve Home dose: Warfarin 1.5 mg PO daily Last dose PTA: 01/12/2020 at 2200  Assessment: 61 y/oM with PMH of rheumatic heart disease s/p mitral valve replacement, stroke on chronic warfarin anticoagulation presented to [facility] ED on 01/20/2020 with worsening back pain after a recent fall. CT abdomen pelvis negative for retroperitoneal hematoma. Planning for kyphoplasty - tentatively scheduled for 11/9 or 11/10.   Anticoagulation will be transitioned to heparin drip once INR  becomes subtherapeutic in anticipation for procedure.  Significant Events:  Last dose of warfarin: 11/1  Vitamin K 5 mg IV once on 11/5 at 1014  Today, 01/17/20   Heparin level = 0.77 on heparin 800 units/hr  INR won 1,8 after Vitamin K yesterday   CBC: Hgb low but stable, Plt WNL.  No significant new DDI. Pt on amiodarone + warfarin PTA.  MRI revealed possible small gluteal hematoma   RN noted bright red blood after patient voided this AM - MD notified  Goal of Therapy:  INR 3-3.5 (per Anticoagulation Clinic notes)  Heparin level 0.3-0.5 units/ml Monitor platelets by anticoagulation protocol: Yes   Plan:   Continue to hold warfarin until cleared to resume after invasive procedures  Decrease heparin infusion to 700 units/hr  Heparin level in 8hr  Daily CBC, heparin level, INR  Monitor for worsening of hematoma or other bleeding issues   Peggyann Juba, PharmD, BCPS Pharmacy: [phone] 01/16/2020 10:03 PM

## 2020-01-18 ENCOUNTER — Inpatient Hospital Stay (HOSPITAL_COMMUNITY): Payer: BC Managed Care – PPO

## 2020-01-18 DIAGNOSIS — M549 Dorsalgia, unspecified: Secondary | ICD-10-CM | POA: Diagnosis not present

## 2020-01-18 DIAGNOSIS — G450 Vertebro-basilar artery syndrome: Secondary | ICD-10-CM | POA: Diagnosis not present

## 2020-01-18 LAB — CBC
HCT: 24.5 % — ABNORMAL LOW (ref 39.0–52.0)
HCT: 26.3 % — ABNORMAL LOW (ref 39.0–52.0)
Hemoglobin: 7.7 g/dL — ABNORMAL LOW (ref 13.0–17.0)
Hemoglobin: 8.1 g/dL — ABNORMAL LOW (ref 13.0–17.0)
MCH: 33.9 pg (ref 26.0–34.0)
MCH: 33.5 pg (ref 26.0–34.0)
MCHC: 31.4 g/dL (ref 30.0–36.0)
MCHC: 30.8 g/dL (ref 30.0–36.0)
MCV: 107.9 fL — ABNORMAL HIGH (ref 80.0–100.0)
MCV: 108.7 fL — ABNORMAL HIGH (ref 80.0–100.0)
Platelets: 169 10*3/uL (ref 150–400)
Platelets: 146 10*3/uL — ABNORMAL LOW (ref 150–400)
RBC: 2.27 MIL/uL — ABNORMAL LOW (ref 4.22–5.81)
RBC: 2.42 MIL/uL — ABNORMAL LOW (ref 4.22–5.81)
RDW: 18.7 % — ABNORMAL HIGH (ref 11.5–15.5)
RDW: 18.8 % — ABNORMAL HIGH (ref 11.5–15.5)
WBC: 6 10*3/uL (ref 4.0–10.5)
WBC: 5.8 10*3/uL (ref 4.0–10.5)
nRBC: 0 % (ref 0.0–0.2)
nRBC: 0 % (ref 0.0–0.2)

## 2020-01-18 LAB — GLUCOSE, CAPILLARY
Glucose-Capillary: 84 mg/dL (ref 70–99)
Glucose-Capillary: 83 mg/dL (ref 70–99)
Glucose-Capillary: 109 mg/dL — ABNORMAL HIGH (ref 70–99)
Glucose-Capillary: 91 mg/dL (ref 70–99)

## 2020-01-18 LAB — RENAL FUNCTION PANEL
Albumin: 3 g/dL — ABNORMAL LOW (ref 3.5–5.0)
Anion gap: 8 (ref 5–15)
BUN: 41 mg/dL — ABNORMAL HIGH (ref 8–23)
CO2: 24 mmol/L (ref 22–32)
Calcium: 7.9 mg/dL — ABNORMAL LOW (ref 8.9–10.3)
Chloride: 108 mmol/L (ref 98–111)
Creatinine, Ser: 3.11 mg/dL — ABNORMAL HIGH (ref 0.61–1.24)
GFR, Estimated: 19 mL/min — ABNORMAL LOW (ref >60)
Glucose, Bld: 100 mg/dL — ABNORMAL HIGH (ref 70–99)
Phosphorus: 3.4 mg/dL (ref 2.5–4.6)
Potassium: 4.2 mmol/L (ref 3.5–5.1)
Sodium: 140 mmol/L (ref 135–145)

## 2020-01-18 LAB — PROTIME-INR
INR: 1.6 — ABNORMAL HIGH (ref 0.8–1.2)
Prothrombin Time: 18.5 seconds — ABNORMAL HIGH (ref 11.4–15.2)

## 2020-01-18 LAB — HEPARIN LEVEL (UNFRACTIONATED): Heparin Unfractionated: 0.62 IU/mL (ref 0.30–0.70)

## 2020-01-18 MED ORDER — SODIUM CHLORIDE 0.9 % IV SOLN: INTRAVENOUS | Status: DC

## 2020-01-18 MED ORDER — HEPARIN (PORCINE) 25000 UT/250ML-% IV SOLN
650.0000 [IU]/h | INTRAVENOUS | Status: DC
  Administered 2020-01-18: 500 [IU]/h via INTRAVENOUS
  Filled 2020-01-18: qty 250

## 2020-01-18 NOTE — Progress Notes (Signed)
Robert [name]  UJW:[id number] DOB: 29-Aug-1940 DOA: 01/27/2020 PCP: Bonnita Nasuti, MD    Brief Narrative:  79yo with a history of DM2, chronic voiding difficulty followed at [facility], CVA, gastric cancer status post gastrectomy, rheumatic heart disease/severe mitral stenosis status post MVR 2015 on chronic warfarin, persistent atrial fibrillation, and systolic CHF with EF 56-21% who presented to the ED with worsening back pain after a recent mechanical fall.  At the time of his fall he was evaluated in the ED and found to have an L2 compression fracture.  He was able to be sent home but his pain has not been controlled with pain medications.  Significant Events:  11/2 admit via ED  Antimicrobials:  None  DVT prophylaxis: Warfarin  Subjective: Afebrile.  Vital signs stable.  Hemoglobin has dropped significantly overnight from 10.2-7.7.  Only clear source of blood loss is hematuria.  Heparin was placed on hold as a result.  Over the course of the day the patient's hemoglobin has proven to be stable.  His hematuria has significantly improved.  After lengthy discussion with the patient and his wife we will resume heparin and monitor closely for recurrent hematuria.  The patient denies chest pain abdominal pain nausea or vomiting.  He continues to void spontaneously with no evidence of urinary obstruction.  Assessment & Plan:  Subacute L2 compression fracture status post mechanical fall -intractable low back pain IR following for L2 kyphoplasty likely 11/9 or 11/10 - INR will be 1.4 or < in time for the procedure  Acute kidney injury on CKD stage IIIb Baseline creatinine 1.19 May 2019 - creatinine 2.88 at presentation - CT noted increased bilateral perinephric inflammatory stranding and distended bladder but no evidence of hydronephrosis - suspect this is prerenal with poor oral intake in setting of narcotic use and uncontrolled pain -continue to gently hydrate and follow trend -  avoid NSAIDs and IV contrast - Nephrology following - Foley placement was attempted 11/5 but resistance was met and patient refused reattempt with coud catheter  Recent Labs  Lab 02/04/2020 1319 01/15/20 0330 01/16/20 0339 01/17/20 1035 01/18/20 0508  CREATININE 2.88* 2.95* 3.16* 3.17* 3.11*    Gross hematuria A consequence of failed attempt at placing Foley catheter in setting of heparin drip -monitor hemoglobin closely -watch for signs of obstruction caused by clotting -routine bladder scans being obtained  Recent Labs  Lab 01/15/20 0330 01/15/20 0934 01/17/20 1035 01/18/20 0508 01/18/20 1308  HGB 10.1* 10.1* 10.2* 7.7* 8.1*     Acute blood loss anemia Due to gross hematuria with heparin drip -hemoglobin has stabilized on recheck this afternoon -resume heparin and monitor closely  Persistent valvular atrial fibrillation Continue amiodarone and Coreg -holding Coumadin with need for procedure - establish therapeutic INR goal of 3-3.5 -rate controlled  Recent Labs  Lab 01/15/20 0330 01/16/20 0339 01/16/20 1954 01/17/20 1035 01/18/20 0508  INR 4.7* 5.2* 2.4* 1.8* 1.6*    Coumadin induced coagulopathy holding Coumadin for now - goal was to allow to drift down into therapeutic range then transition to heparin when drops below therapeutic range but with INR climbing and possible gluetal hematoma on MRI was dosed with Vitamin K 5 mg x 1 11/6 with INR improved significantly   Chronic systolic CHF Appears euvolemic versus mildly dehydrated at present  [employer]   01/14/20 0515 01/17/20 0700 01/18/20 0632  Weight: 54.9 kg 53.2 kg 53 kg    Chronic constipation Due to narcotic use and low back  injury - cont bowel regimen - allow pt to use Trulance from his own home supply   Hypokalemia > Hyperkalemia  Likely a consequence of poor oral intake - corrected w/ supplementation - K+ stable   Macrocytic anemia B12 and folate are not low - iron is normal  HTN Blood  pressure trending upward - follow trend for now without change in treatment  HLD Continue usual home medication  DM2 - diet controlled CBG well controlled  Code Status: FULL CODE Family Communication: Spoke with wife at length at bedside Status is: Inpatient  Remains inpatient appropriate because:Ongoing active pain requiring inpatient pain management   Dispo: The patient is from: Home              Anticipated d/c is to: Home              Anticipated d/c date is: > 3 days              Patient currently is not medically stable to d/c.   Consultants:  IR  Objective: Blood pressure 133/77, pulse 72, temperature 97.8 F (36.6 C), temperature source Oral, resp. rate 14, height 5' 6" (1.676 m), weight 53 kg, SpO2 100 %.  Intake/Output Summary (Last 24 hours) at 01/18/2020 1606 Last data filed at 01/18/2020 1406 Gross per 24 hour  Intake 1137.11 ml  Output 837 ml  Net 300.11 ml   Filed Weights   01/14/20 0515 01/17/20 0700 01/18/20 2536  Weight: 54.9 kg 53.2 kg 53 kg    Examination: General: No acute respiratory distress  Lungs: CTA B without wheezing Cardiovascular: R regular rate without murmur or rub Abdomen: NT/ND, soft, bs+, no mass Extremities: No C/C/E B lower extremities  CBC: Recent Labs  Lab 01/17/20 1035 01/18/20 0508 01/18/20 1308  WBC 6.9 6.0 5.8  HGB 10.2* 7.7* 8.1*  HCT 33.4* 24.5* 26.3*  MCV 107.1* 107.9* 108.7*  PLT 162 169 644*   Basic Metabolic Panel: Recent Labs  Lab 01/20/2020 1600 01/15/20 0330 01/16/20 0339 01/17/20 1035 01/18/20 0508  NA  --    < > 141 140 140  K  --    < > 5.0 4.6 4.2  CL  --    < > 106 105 108  CO2  --    < > _0 GLUCOSE  --    < > 86 115* 100*  BUN  --    < > 38* 40* 41*  CREATININE  --    < > 3.16* 3.17* 3.11*  CALCIUM  --    < > 8.2* 8.1* 7.9*  MG 2.6*  --   --   --   --   PHOS  --   --   --  3.5 3.4   < > = values in this interval not displayed.   GFR: Estimated Creatinine Clearance: 14.2 mL/min  (A) (by C-G formula based on SCr of 3.11 mg/dL (H)).  Liver Function Tests: Recent Labs  Lab 02/01/2020 1319 01/15/20 0330 01/17/20 1035 01/18/20 0508  AST 37 35  --   --   ALT 21 20  --   --   ALKPHOS 68 62  --   --   BILITOT 2.2* 1.9*  --   --   PROT 7.2 6.4*  --   --   ALBUMIN 3.5 3.0* 3.1* 3.0*    Coagulation Profile: Recent Labs  Lab 01/15/20 0330 01/16/20 0339 01/16/20 1954 01/17/20 1035 01/18/20 0508  INR 4.7*  5.2* 2.4* 1.8* 1.6*    HbA1C: Hgb A1c MFr Bld  Date/Time Value Ref Range Status  01/15/2020 03:33 AM 5.1 4.8 - 5.6 % Final    Comment:    (NOTE) Pre diabetes:          5.7%-6.4%  Diabetes:              >6.4%  Glycemic control for   <7.0% adults with diabetes   12/02/2016 02:36 AM 6.0 (H) 4.8 - 5.6 % Final    Comment:    (NOTE) Pre diabetes:          5.7%-6.4% Diabetes:              >6.4% Glycemic control for   <7.0% adults with diabetes     Recent Results (from the past 240 hour(s))  Respiratory Panel by RT PCR (Flu A&B, Covid) - Nasopharyngeal Swab     Status: None   Collection Time: 01/12/2020  3:08 PM   Specimen: Nasopharyngeal Swab  Result Value Ref Range Status   SARS Coronavirus 2 by RT PCR NEGATIVE NEGATIVE Final    Comment: (NOTE) SARS-CoV-2 target nucleic acids are NOT DETECTED.  The SARS-CoV-2 RNA is generally detectable in upper respiratoy specimens during the acute phase of infection. The lowest concentration of SARS-CoV-2 viral copies this assay can detect is 131 copies/mL. A negative result does not preclude SARS-Cov-2 infection and should not be used as the sole basis for treatment or other patient management decisions. A negative result may occur with  improper specimen collection/handling, submission of specimen other than nasopharyngeal swab, presence of viral mutation(s) within the areas targeted by this assay, and inadequate number of viral copies (<131 copies/mL). A negative result must be combined with  clinical observations, patient history, and epidemiological information. The expected result is Negative.  Fact Sheet for Patients:  PinkCheek.be  Fact Sheet for Healthcare Providers:  GravelBags.it  This test is no t yet approved or cleared by the [place] FDA and  has been authorized for detection and/or diagnosis of SARS-CoV-2 by FDA under an Emergency Use Authorization (EUA). This EUA will remain  in effect (meaning this test can be used) for the duration of the COVID-19 declaration under Section 564(b)(1) of the Act, 21 U.S.C. section 360bbb-3(b)(1), unless the authorization is terminated or revoked sooner.     Influenza A by PCR NEGATIVE NEGATIVE Final   Influenza B by PCR NEGATIVE NEGATIVE Final    Comment: (NOTE) The Xpert Xpress SARS-CoV-2/FLU/RSV assay is intended as an aid in  the diagnosis of influenza from Nasopharyngeal swab specimens and  should not be used as a sole basis for treatment. Nasal washings and  aspirates are unacceptable for Xpert Xpress SARS-CoV-2/FLU/RSV  testing.  Fact Sheet for Patients: PinkCheek.be  Fact Sheet for Healthcare Providers: GravelBags.it  This test is not yet approved or cleared by the [place] FDA and  has been authorized for detection and/or diagnosis of SARS-CoV-2 by  FDA under an Emergency Use Authorization (EUA). This EUA will remain  in effect (meaning this test can be used) for the duration of the  Covid-19 declaration under Section 564(b)(1) of the Act, 21  U.S.C. section 360bbb-3(b)(1), unless the authorization is  terminated or revoked. Performed at [facility], [place] [address], [place], [place] [address]      Scheduled Meds: . amiodarone  200 mg Oral Daily  . aspirin EC  81 mg Oral Daily  . atorvastatin  20 mg Oral QHS  .  carvedilol  6.25 mg Oral BID WC  . hydrALAZINE   25 mg Oral BID  . lidocaine  1 patch Transdermal Q24H  . Plecanatide  1 tablet Oral Daily  . senna-docusate  1 tablet Oral BID  . sodium chloride flush  3 mL Intravenous Q12H  . tamsulosin  0.8 mg Oral Daily  . vitamin B-12  1,000 mcg Oral Daily     LOS: 5 days   Cherene Altes, MD [facility] Office  [phone] Pager - Text Page per Amion  If 7PM-7AM, please contact night-coverage per Amion 01/18/2020, 4:06 PM

## 2020-01-18 NOTE — Progress Notes (Addendum)
[facility] Progress Note  Subjective: UOP 450 cc yest, some mild hematuria overnight, nothing dramatic per the RN. Hb down mid 7's, I put IV heparin on hold at 6 am  Vitals:   01/16/20 2053 01/17/20 1320 01/17/20 2116 01/18/20 0424  BP: (!) 150/80 131/76 138/81 133/77  Pulse: 63 62 71 72  Resp: 14 16 15 14   Temp: (!) 97.3 F (36.3 C) 97.7 F (36.5 C) (!) 97.5 F (36.4 C) 97.8 F (36.6 C)  TempSrc: Oral Oral Oral Oral  SpO2: 98% 97% 99% 100%  Weight:      Height:        Exam: Gen alert, frail elderly male No jvd or bruits Chest off rales L base, R clear Cor irreg no RG Abd soft ntnd no mass or ascites +bs Ext 1+ ankle edema, no other edema Neuro is alert, Ox 3 , nf    Home meds:  - amiodarone/ asa / lipitor qd/ coreg bid/ lasix 40am+ 20pm/ hydralazine 25 bid  - warfarin qd  - flomax qd/ ultram 50 tid/ trulance qd  - prn's/ vitamins/ supplements       Date                          Creat               eGFR (ml/min)    2017 - July 2021      1.5- 1.9            39- 50    Sept 2021                 2.41         Oct 2021                  2.35- 2.64    Nov 2                        2.88    Nov 4                        2.95    Jan 16, 2020              3.16                 19   ECHO sept 2021 - LVEF 35-40%, afib, global LVH, mod reduced RV fxn, replaced MV, severe LAE/ RAE  UA 10/8 - negative   UA 11/05 - 0-5 rbc/ wbc, neg protein, clear   Renal US - 9.2/ 9.6 cm kidneys w/o hydro, ^echo bilat    UNa 28, UCr 89   CXR 11/05 - mild atx and mild vasc congestion      Assessment/ Plan: 1. AoCKD 3a - b/l creat 1.5- 1.9 from mid 2021, eGFR 39- 50.  Creat rising over the last 2-3 mos. Has sig problems w/ CHF f/b Dr Debara Pickett. CXR no CHF here, home lasix on hold. RUS shows chronic renal disease no obstruction. UA negative. Possible cardiorenal and/or bladder retention. Regular foley cath was unable to be placed, recommend urology consult if bladder retention  occurs. If cardiorenal this may be new baseline given slow rise over last few mos and no gross vol overload. Started gentle IVF 11/6, creat stable 3.1 today. Will continue IVF, ^ slightly.  Get daily wt. No signs of uremia. I don't think pt  would do well on dialysis. Will follow.  2. Bladder retention - pt seen by urology [facility] in August for voiding difficulty and bladder diverticulum. Urology felt that cystoscopy would be too risky due to pt's comorbidities, urology/ family decided on a conservative path.  3. Fall/ L2 compression fracture - on pain meds per pmd 4. CHF - chronic syst HF, last EF 35-40% in August. F/b Dr Debara Pickett, on lasix and coreg at home. Lasix on hold here. I/O about even.  5. HTN - cont meds coreg /hydralazine, BP's stable 6. 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 Permanent atrial fib - on a/c, amio, BB        Robert [name] 01/18/2020, 6:09 AM   Recent Labs  Lab 01/17/20 1035 01/18/20 0508  K 4.6 4.2  BUN 40* 41*  CREATININE 3.17* 3.11*  CALCIUM 8.1* 7.9*  PHOS 3.5 3.4  HGB 10.2* 7.7*   Inpatient medications: . amiodarone  200 mg Oral Daily  . aspirin EC  81 mg Oral Daily  . atorvastatin  20 mg Oral QHS  . carvedilol  6.25 mg Oral BID WC  . hydrALAZINE  25 mg Oral BID  . lidocaine  1 patch Transdermal Q24H  . Plecanatide  1 tablet Oral Daily  . senna-docusate  1 tablet Oral BID  . sodium chloride flush  3 mL Intravenous Q12H  . tamsulosin  0.8 mg Oral Daily  . vitamin B-12  1,000 mcg Oral Daily   . sodium chloride 65 mL/hr at 01/18/20 0359   acetaminophen, bisacodyl, magnesium citrate, morphine injection, oxyCODONE, polyethylene glycol, traMADol

## 2020-01-18 NOTE — Progress Notes (Signed)
Carotid artery duplex completed. Refer to "CV Proc" under chart review to view preliminary results.  01/18/2020 11:36 AM Kelby Aline., MHA, RVT, RDCS, RDMS

## 2020-01-18 NOTE — Progress Notes (Signed)
Post void residual, after voiding 125cc of dark amber urine containing a small blood clot, was 254 ml. Bleeding from penis substantially reduced from yesterday.

## 2020-01-18 NOTE — Progress Notes (Signed)
[place] for: Heparin bridge one INR <3 (warfarin on hold) Indication: history of mitral valve replacement  Allergies  Allergen Reactions  . Penicillins Nausea And Vomiting    08/23/2018 pt has recently tolerated Amoxicillin per pt and wife Has patient had a PCN reaction causing immediate rash, facial/tongue/throat swelling, SOB or lightheadedness with hypotension: No Has patient had a PCN reaction causing severe rash involving mucus membranes or skin necrosis: No Has patient had a PCN reaction that required hospitalization: Unknown Has patient had a PCN reaction occurring within the last 10 years: No If all of the above answers are "NO", then may proceed with Cephalospor  . Vancomycin Other (See Comments)    Kidney problems    Patient Measurements: Height: 5\' 6"  Weight: 54.9 kg  Vital Signs: Temp: 97.8 F (36.6 C) (11/07 0424) Temp Source: Oral (11/07 0424) BP: 133/77 (11/07 0424) Pulse Rate: 72 (11/07 0424)  Labs: Recent Labs    01/16/20 0339 01/16/20 0339 01/16/20 1954 01/17/20 1035 01/17/20 1035 01/17/20 2019 01/18/20 0508 01/18/20 1308  HGB  --   --   --  10.2*   < >  --  7.7* 8.1*  HCT  --   --   --  33.4*  --   --  24.5* 26.3*  PLT  --   --   --  162  --   --  169 146*  LABPROT 46.3*   < > 25.0* 20.5*  --   --  18.5*  --   INR 5.2*   < > 2.4* 1.8*  --   --  1.6*  --   HEPARINUNFRC  --   --   --  0.77*  --  0.66 0.62  --   CREATININE 3.16*  --   --  3.17*  --   --  3.11*  --    < > = values in this interval not displayed.    Estimated Creatinine Clearance: 14.2 mL/min (A) (by C-G formula based on SCr of 3.11 mg/dL (H)).  Medications: Warfarin PTA for mechanical mitral valve Home dose: Warfarin 1.5 mg PO daily Last dose PTA: 01/12/2020 at 2200  Assessment: 25 y/oM with PMH of rheumatic heart disease s/p mitral valve replacement, stroke on chronic warfarin anticoagulation presented [facility] ED on 02/05/2020 with worsening  back pain after a recent fall. CT abdomen pelvis negative for retroperitoneal hematoma. Planning for kyphoplasty - tentatively scheduled for 11/9 or 11/10.   Pt's currently on heparin drip in anticipation for procedure.  Significant Events: - Last dose of warfarin: 11/1 - Vitamin K 5 mg IV once on 11/5 at 1014 - 11/7: heparin drip d/ced at [place] d/t drop in hgb  Today, 01/18/2020: - heparin level was 0.62 this morning with rate running at 600 units/hr  - Hgb at 5a was 7.7, hgb at 1p was 8.1 - per Dr. Thereasa Solo, hematuria has improved, ok to resume heparin drip baclk at 6p - MRI revealed possible small gluteal hematoma   Goal of Therapy:  INR 3-3.5 (per Anticoagulation Clinic notes)  Heparin level 0.3-0.5 units/ml Monitor platelets by anticoagulation protocol: Yes   Plan:  - resume heparin drip back at 500 ml/hr at 6pm - Heparin level in 8hr - Daily CBC, heparin level, INR - Monitor for worsening of hematoma, severity of hematuria  Dia Sitter, PharmD, BCPS 01/18/2020 4:14 PM

## 2020-01-18 NOTE — Progress Notes (Signed)
[place] for: Heparin bridge one INR <3 (warfarin on hold) Indication: history of mitral valve replacement  Allergies  Allergen Reactions  . Penicillins Nausea And Vomiting    08/23/2018 pt has recently tolerated Amoxicillin per pt and wife Has patient had a PCN reaction causing immediate rash, facial/tongue/throat swelling, SOB or lightheadedness with hypotension: No Has patient had a PCN reaction causing severe rash involving mucus membranes or skin necrosis: No Has patient had a PCN reaction that required hospitalization: Unknown Has patient had a PCN reaction occurring within the last 10 years: No If all of the above answers are "NO", then may proceed with Cephalospor  . Vancomycin Other (See Comments)    Kidney problems    Patient Measurements: Height: 5\' 6"  Weight: 54.9 kg  Vital Signs: Temp: 97.8 F (36.6 C) (11/07 0424) Temp Source: Oral (11/07 0424) BP: 133/77 (11/07 0424) Pulse Rate: 72 (11/07 0424)  Labs: Recent Labs    01/15/20 0934 01/15/20 0934 01/16/20 0339 01/16/20 0339 01/16/20 1954 01/17/20 1035 01/17/20 2019 01/18/20 0508  HGB 10.1*   < >  --   --   --  10.2*  --  7.7*  HCT 31.3*  --   --   --   --  33.4*  --  24.5*  PLT 170  --   --   --   --  162  --  169  LABPROT  --   --  46.3*   < > 25.0* 20.5*  --  18.5*  INR  --   --  5.2*   < > 2.4* 1.8*  --  1.6*  HEPARINUNFRC  --   --   --   --   --  0.77* 0.66 0.62  CREATININE  --   --  3.16*  --   --  3.17*  --  3.11*   < > = values in this interval not displayed.    Estimated Creatinine Clearance: 14.7 mL/min (A) (by C-G formula based on SCr of 3.11 mg/dL (H)).  Medications: Warfarin PTA for mechanical mitral valve Home dose: Warfarin 1.5 mg PO daily Last dose PTA: 01/12/2020 at 2200  Assessment: 20 y/oM with PMH of rheumatic heart disease s/p mitral valve replacement, stroke on chronic warfarin anticoagulation presented to [facility] ED on 02/06/2020 with worsening  back pain after a recent fall. CT abdomen pelvis negative for retroperitoneal hematoma. Planning for kyphoplasty - tentatively scheduled for 11/9 or 11/10.   Significant Events:  Last dose of warfarin: 11/1  Vitamin K 5 mg IV once on 11/5 at 1014  Today, 01/18/20   Heparin level = 0.62 on heparin 600 units/hr  INR 1.6  Hgb down 7.7, Plts WNL  No significant new DDI. Pt on amiodarone + warfarin PTA.  MRI revealed possible small gluteal hematoma   RN noted bright red blood after patient voided this AM - MD notified  Goal of Therapy:  INR 3-3.5 (per Anticoagulation Clinic notes)  Heparin level 0.3-0.5 units/ml Monitor platelets by anticoagulation protocol: Yes   Plan:   Per MD stop heparin indefinitely due to bleeding from urethral trauma  F/u [facility] plans   Dolly Rias RPh 01/18/2020, 6:11 AM

## 2020-01-18 NOTE — Progress Notes (Signed)
Bladder scan pt. 369 ml noted. Pt states he does not need to void.  Have alerted Dr. Jonnie Finner and Dr. Thereasa Solo.

## 2020-01-18 NOTE — Progress Notes (Signed)
Pt c/o right upper arm pain. Yesterday, after lab drew blood from the right A/C, pt c/o of right arm elbow pain. Arm was propped up. Pt states that it helped and no more c/o were noted. Today, he is c/o of right elbow and upper arm pain. Arm was propped up and heat back was applied to back of right arm and pt stated the pain was better. At shift change this evening, the arm was noted by oncoming nurse, Hui, to be slightly swollen.  Monitoring to continue and will alert MD as soon as possible. Heparin drip started at this time. Spouse in room and is aware.

## 2020-01-19 ENCOUNTER — Inpatient Hospital Stay (HOSPITAL_COMMUNITY): Payer: BC Managed Care – PPO | Admitting: Certified Registered Nurse Anesthetist

## 2020-01-19 ENCOUNTER — Inpatient Hospital Stay (HOSPITAL_COMMUNITY): Payer: BC Managed Care – PPO

## 2020-01-19 DIAGNOSIS — M549 Dorsalgia, unspecified: Secondary | ICD-10-CM | POA: Diagnosis not present

## 2020-01-19 LAB — PROTIME-INR
INR: 1.7 — ABNORMAL HIGH (ref 0.8–1.2)
INR: 1.7 — ABNORMAL HIGH (ref 0.8–1.2)
Prothrombin Time: 19.1 seconds — ABNORMAL HIGH (ref 11.4–15.2)
Prothrombin Time: 19.1 seconds — ABNORMAL HIGH (ref 11.4–15.2)

## 2020-01-19 LAB — RENAL FUNCTION PANEL
Albumin: 2.5 g/dL — ABNORMAL LOW (ref 3.5–5.0)
Anion gap: 8 (ref 5–15)
BUN: 41 mg/dL — ABNORMAL HIGH (ref 8–23)
CO2: 21 mmol/L — ABNORMAL LOW (ref 22–32)
Calcium: 7.6 mg/dL — ABNORMAL LOW (ref 8.9–10.3)
Chloride: 111 mmol/L (ref 98–111)
Creatinine, Ser: 3.04 mg/dL — ABNORMAL HIGH (ref 0.61–1.24)
GFR, Estimated: 20 mL/min — ABNORMAL LOW (ref >60)
Glucose, Bld: 99 mg/dL (ref 70–99)
Phosphorus: 4.1 mg/dL (ref 2.5–4.6)
Potassium: 4.5 mmol/L (ref 3.5–5.1)
Sodium: 140 mmol/L (ref 135–145)

## 2020-01-19 LAB — CBC
HCT: 23.6 % — ABNORMAL LOW (ref 39.0–52.0)
HCT: 22.7 % — ABNORMAL LOW (ref 39.0–52.0)
Hemoglobin: 7.3 g/dL — ABNORMAL LOW (ref 13.0–17.0)
Hemoglobin: 7.2 g/dL — ABNORMAL LOW (ref 13.0–17.0)
MCH: 33.6 pg (ref 26.0–34.0)
MCH: 33.8 pg (ref 26.0–34.0)
MCHC: 30.9 g/dL (ref 30.0–36.0)
MCHC: 31.7 g/dL (ref 30.0–36.0)
MCV: 108.8 fL — ABNORMAL HIGH (ref 80.0–100.0)
MCV: 106.6 fL — ABNORMAL HIGH (ref 80.0–100.0)
Platelets: 133 10*3/uL — ABNORMAL LOW (ref 150–400)
Platelets: 142 10*3/uL — ABNORMAL LOW (ref 150–400)
RBC: 2.17 MIL/uL — ABNORMAL LOW (ref 4.22–5.81)
RBC: 2.13 MIL/uL — ABNORMAL LOW (ref 4.22–5.81)
RDW: 19 % — ABNORMAL HIGH (ref 11.5–15.5)
RDW: 18.7 % — ABNORMAL HIGH (ref 11.5–15.5)
WBC: 6.2 10*3/uL (ref 4.0–10.5)
WBC: 6 10*3/uL (ref 4.0–10.5)
nRBC: 0 % (ref 0.0–0.2)
nRBC: 0.3 % — ABNORMAL HIGH (ref 0.0–0.2)

## 2020-01-19 LAB — TYPE AND SCREEN
ABO/RH(D): O POS
Antibody Screen: NEGATIVE
Unit division: 0

## 2020-01-19 LAB — BLOOD GAS, ARTERIAL
Acid-base deficit: 3.8 mmol/L — ABNORMAL HIGH (ref 0.0–2.0)
Bicarbonate: 21.1 mmol/L (ref 20.0–28.0)
FIO2: 100
O2 Saturation: 80.5 %
Patient temperature: 98.6
pCO2 arterial: 40 mmHg (ref 32.0–48.0)
pH, Arterial: 7.341 — ABNORMAL LOW (ref 7.350–7.450)
pO2, Arterial: 50.3 mmHg — ABNORMAL LOW (ref 83.0–108.0)

## 2020-01-19 LAB — HEPARIN LEVEL (UNFRACTIONATED)
Heparin Unfractionated: 0.13 IU/mL — ABNORMAL LOW (ref 0.30–0.70)
Heparin Unfractionated: 0.41 IU/mL (ref 0.30–0.70)
Heparin Unfractionated: 0.34 IU/mL (ref 0.30–0.70)

## 2020-01-19 LAB — GLUCOSE, CAPILLARY
Glucose-Capillary: 79 mg/dL (ref 70–99)
Glucose-Capillary: 79 mg/dL (ref 70–99)
Glucose-Capillary: 67 mg/dL — ABNORMAL LOW (ref 70–99)
Glucose-Capillary: 131 mg/dL — ABNORMAL HIGH (ref 70–99)
Glucose-Capillary: 32 mg/dL — CL (ref 70–99)
Glucose-Capillary: 111 mg/dL — ABNORMAL HIGH (ref 70–99)

## 2020-01-19 LAB — BPAM RBC
Blood Product Expiration Date: 202112082359
Unit Type and Rh: 5100

## 2020-01-19 LAB — PREPARE RBC (CROSSMATCH)

## 2020-01-19 MED ORDER — VITAMIN K1 10 MG/ML IJ SOLN
5.0000 mg | Freq: Once | INTRAVENOUS | Status: AC
  Administered 2020-01-19: 5 mg via INTRAVENOUS
  Filled 2020-01-19: qty 0.5

## 2020-01-19 MED ORDER — METHOCARBAMOL 1000 MG/10ML IJ SOLN
500.0000 mg | Freq: Four times a day (QID) | INTRAVENOUS | Status: DC | PRN
  Filled 2020-01-19: qty 5

## 2020-01-20 MED FILL — Medication: Qty: 2 | Status: AC

## 2020-02-11 NOTE — Progress Notes (Signed)
Spoke with Donata Duff, Medical examiner. Patient will be a case for the medical examiners.  Advised to leave all tubes and drains in place.

## 2020-02-11 NOTE — Progress Notes (Signed)
[facility] night shift.  I responded to a code blue in ICU room 1229. Dr. Earlie Server from [facility] was already by bedside directing the code.  He had an earlier code and was hypoglycemic.  I reviewed the chart briefly and then spoke to Robert [name], who was in the waiting area, to let her know that Dr. Earlie Server was already assisting the patient and he would give her an update after CPR/ACLS was performed.  Unfortunately, the patient did not survive his second code.  Dr. Earlie Server subsequently informed her in my presence about what was done and the patient's death.  The case was referred to [place].  We waited with the patient in the ICU/SDU lobby area until the nursing staff took her back to the patient's room.   Robert Must, MD

## 2020-02-11 NOTE — Progress Notes (Addendum)
ANTICOAGULATION CONSULT NOTE - Follow Up Consult  Pharmacy Consult for Heparin bridge (warfarin held) Indication: Hx mitral valve replacement  Allergies  Allergen Reactions  . Penicillins Nausea And Vomiting    08/23/2018 pt has recently tolerated Amoxicillin per pt and wife Has patient had a PCN reaction causing immediate rash, facial/tongue/throat swelling, SOB or lightheadedness with hypotension: No Has patient had a PCN reaction causing severe rash involving mucus membranes or skin necrosis: No Has patient had a PCN reaction that required hospitalization: Unknown Has patient had a PCN reaction occurring within the last 10 years: No If all of the above answers are "NO", then may proceed with Cephalospor  . Vancomycin Other (See Comments)    Kidney problems    Patient Measurements: Height: 5\' 6"  (167.6 cm) Weight: 58.8 kg (129 lb 9.6 oz) (3 pillows andblanket on bed) IBW/kg (Calculated) : 63.8 Heparin Dosing Weight: TBW  Vital Signs: Temp: 97.8 F (36.6 C) (11/08 0548) Temp Source: Oral (11/08 0548) BP: 127/80 (11/08 0548) Pulse Rate: 45 (11/08 0548)  Labs: Recent Labs    01/17/20 1035 01/17/20 2019 01/18/20 0508 01/18/20 0508 01/18/20 1308 02/07/20 0209 02/07/20 1046  HGB 10.2*  --  7.7*   < > 8.1* 7.3*  --   HCT 33.4*  --  24.5*  --  26.3* 23.6*  --   PLT 162  --  169  --  146* 133*  --   LABPROT 20.5*  --  18.5*  --   --  19.1*  --   INR 1.8*  --  1.6*  --   --  1.7*  --   HEPARINUNFRC 0.77*   < > 0.62  --   --  0.13* 0.41  CREATININE 3.17*  --  3.11*  --   --  3.04*  --    < > = values in this interval not displayed.    Estimated Creatinine Clearance: 16.1 mL/min (A) (by C-G formula based on SCr of 3.04 mg/dL (H)).   Medications:  Infusions:  . heparin 650 Units/hr (February 07, 2020 0240)    Assessment: 86 yoM admitted for kyphoplasty and PTA warfarin on hold.  Pharmacy is consulted for heparin dosing. Confirmatory Heparin level 0.34, therapeutic on heparin  650 units/hr Repeat INR 1.7 Repeat CBC:  Low/stable Hgb 7.2, Plt 142 No further bleeding reported.  Goal of Therapy:  INR 3-3.5 (per [facility] clinic notes) Heparin level 0.3-0.5 units/ml (LOW goal) Monitor platelets by anticoagulation protocol: Yes   Plan:  Continue heparin IV infusion at 650 units/hr Daily heparin level and CBC Monitor for s/s bleeding Follow up IR plans for kyphoplasty and when to hold heparin.  Gretta Arab PharmD, BCPS Clinical Pharmacist [facility] main pharmacy [phone] 02/07/2020 2:12 PM

## 2020-02-11 NOTE — Progress Notes (Signed)
Patients wife came to nursing station and stated that she had been speaking with her brother in law who is a Energy manager and he was advising against the patient receiving a blood transfusion.  She further stated that since she was leaving for the evening she did not feel comfortable with the patient receiving blood while she was not in the room with him.  She was informed that our policy here was for the RN administering the blood to remain in the room for the first 15 minutes of the transfusion and to closely monitor patient throughout the remaining time the blood is transfusing.  She again insisted that this RN follow her brother in [place] orders rather than the attending MD.  She was informed that orders from doctors not on staff here could not be followed and that the orders of the attending MD would be followed.

## 2020-02-11 NOTE — Code Documentation (Signed)
Patient gone into cardiac arrest while on the MedSurg floor.  ROSC was obtained and brought to the intensive care unit.  Critical care consult was called for as I walked in the room he went back into PEA cardiac arrest.   ACLS protocol was initiated in addition to CPR.  Patient was previously intubated during the first cardiac arrest event.  Respiratory therapy was at bedside and ventilated the patient via BVM.  The PEA rhythm was a wide-complex rhythm with a rate of 28 bpm.  In addition to the normal ACLS medications, calcium chloride and dextrose were given due to his underlying medical issues.  Echocardiogram was performed.  No pericardial effusion seen.  Heart was a cardiac standstill.  Bilateral pleural slide seen.  After multiple rounds of ACLS were performed patient was declared dead at 21:44 hrs.  Updated patient's wife at bedside.  ME was notified and will be investigating the case.

## 2020-02-11 NOTE — Progress Notes (Signed)
[facility] Progress Note  Background from initial c/s: The patient is a 79 y.o. 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 He had recently been seen in ED for L2 comp fracture. In ED CT abd showed bilat perinephric inflammatory changes, distended bladder w/ diverticula, bilat GG nodules in the lungs. Creat was up to 2.8 , baseline 1.5- 1.9.  Pt admitted.  Since admit creat worse up to 3.16, asked to see for renal failure.    Pt seen in room.  He recently saw a urologist who told him that his and enlarge bladder and that his  "bladder doesn't work", and that normally they would fix it but "he is too sick for that".  He says he was constipated recently because of pain meds for injuries from falling and lost 10 lbs from 1230 > 120 lbs. He says the amount of swelling in his legs is about normal for him.  He has been eating but not as much as usual.  No SOB , orthopnea. No cough or f/c/s.  Voiding is slow, not delayed bladder empyting.   BP's here have been normal to high, lowest 120's.  CT and MRI done w/o contrast. He did get 73m po lasix on 11/2 and once on 11/3, then dc'd. Getting home BP meds, no acei/ ARB. Has been getting IV and po K replacements. No nsaids. Getting oxy po and tramadol.  I/O showed 1025 cc UOP yesterday.   Subjective: c/o worsening pain - radiating down back, back, now arms too.  Having some weakness in legs now too.  Wife bedside and concerned he is worsening despite admission.  Per RN bladder scans not showing retention; he is having some urinary incontinence.  Hematuria persists but not worsening and no large clots.  Tol po intake - has small stomach capacity after prior surgery but seems at baseline per wife.   Vitals:   01/18/20 1750 01/18/20 2058 [id number]  BP: 112/67 129/64  127/80  Pulse: 72 66  (!) 45  Resp: _0 Temp:  98.2 F (36.8 C) 98 F (36.7 C)  97.8 F (36.6 C)  TempSrc:    Oral  SpO2: 100% 100%  99%  Weight:   58.8 kg   Height:        Exam: Gen alert, frail elderly male No jvd or bruits Chest = auscultation with him supine with rales L base, R clear Cor irreg no RG Abd soft ntnd no mass or ascites +bs Ext no edema except 1+ RUE dependent edema Neuro is alert, Ox 3 , nf    Home meds:  - amiodarone/ asa / lipitor qd/ coreg bid/ lasix 40am+ 20pm/ hydralazine 25 bid  - warfarin qd  - flomax qd/ ultram 50 tid/ trulance qd  - prn's/ vitamins/ supplements       Date                          Creat               eGFR (ml/min)    2017 - July 2021      1.5- 1.9            39- 50    Sept 2021  2.41         Oct 2021                  2.35- 2.64    Nov 2                        2.88    Nov 4                        2.95    Jan 16, 2020              3.16                 19   ECHO sept 2021 - LVEF 35-40%, afib, global LVH, mod reduced RV fxn, replaced MV, severe LAE/ RAE  UA 10/8 - negative   UA 11/05 - 0-5 rbc/ wbc, neg protein, clear   Renal US - 9.2/ 9.6 cm kidneys w/o hydro, ^echo bilat    UNa 28, UCr 89   CXR 11/05 - mild atx and mild vasc congestion      Assessment/ Plan: 1. AoCKD 3a - b/l creat 1.5- 1.9 from mid 2021, eGFR 39- 50.  Creat rising over the last 2-3 mos. Has sig problems w/ CHF f/b Dr Debara Pickett. CXR no CHF here, home lasix on hold. RUS shows chronic renal disease no obstruction. UA negative. Possible cardiorenal and/or bladder retention. Regular foley cath was unable to be placed, recommend urology consult if bladder retention occurs but that is not needed currently. If cardiorenal this may be new baseline given slow rise over last few mos and no gross vol overload. Started gentle IVF 11/6, creat stable 3.04 today.  Developing mild edema so will d/c IVF.  Get daily wt. No signs of uremia. I don't think pt would do well on dialysis. Will follow.  2. Bladder  retention - pt seen by urology [facility] in August for voiding difficulty and bladder diverticulum. Urology felt that cystoscopy would be too risky due to pt's comorbidities, urology/ family decided on a conservative path. Discussed at length today - continue to follow bladder scans to ensure no retention that would worsen AKI but no plans to c/s urology.  3. Fall/ L2 compression fracture - on pain meds per pmd with plans for kyphoplasty when INR allows.  With what seems to be an increase in pain primary MD may wish to reimage.   4. CHF - chronic syst HF, last EF 35-40% in August. F/b Dr Debara Pickett, on lasix and coreg at home. Lasix on hold here. I/O +1.2 for admission.  D/c IVF today, hold on diuretics as well and cont daily assessment.  5. HTN - cont meds coreg /hydralazine, BP's stable 6. 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 Permanent atrial fib - on a/c, [place], BB   Jannifer Hick MD [facility] Pager [phone]   Recent Labs  Lab 01/18/20 [phone] 01/18/20 0508 01/18/20 1308 02/09/20 0209  K 4.2  --   --  4.5  BUN 41*  --   --  41*  CREATININE 3.11*  --   --  3.04*  CALCIUM 7.9*  --   --  7.6*  PHOS 3.4  --   --  4.1  HGB 7.7*   < > 8.1* 7.3*   < > = values in this interval not displayed.   Inpatient medications: . amiodarone  200 mg  Oral Daily  . aspirin EC  81 mg Oral Daily  . atorvastatin  20 mg Oral QHS  . carvedilol  6.25 mg Oral BID WC  . hydrALAZINE  25 mg Oral BID  . lidocaine  1 patch Transdermal Q24H  . Plecanatide  1 tablet Oral Daily  . senna-docusate  1 tablet Oral BID  . sodium chloride flush  3 mL Intravenous Q12H  . tamsulosin  0.8 mg Oral Daily  . vitamin B-12  1,000 mcg Oral Daily   . sodium chloride 60 mL/hr at 01/18/20 1649  . heparin 650 Units/hr (January 23, 2020 0240)  . phytonadione (VITAMIN K) IV     acetaminophen, bisacodyl, magnesium citrate, morphine injection, oxyCODONE, polyethylene glycol, traMADol

## 2020-02-11 NOTE — Progress Notes (Signed)
   01/20/2020  The events of last night were reviewed face-to-face with Dr. Olevia Bowens who was covering the night shift last night.  I reviewed the patient's chart.  The cause of his acute decline is not clear at this time, but the Medical Examiner will be investigating.  I received a secure chat message from Placido Sou, RN providing me with a phone number [phone]) for the deceased gentlemen's brother who is a Energy manager in [place] who requested a call from this Attending Physician.  The nurse confirmed to me that the patient's wife gave consent for me to speak with the decedent's brother.  I called and spoke with Dr. Silverio Decamp.  We had a brief conversation.  He was not unpleasant but understandably upset.  He voiced his concern that a Foley catheter should not have been placed.  I explained to him that Nephrology suggested the catheter and that I agreed with its use out of concern for obstructive uropathy which was leading to progressive acute renal failure.  The patient's brother went on to explain that he and the decedent's wife were very displeased with "a nursing student" being allowed to attempt the Foley catheter placement "in a high risk patient" and they feel this is the reason the placement was unsuccessful and the patient developed hematuria.  He went on to explain that "the nursing student was extremely rude."  He asked that the particular details of the attempted catheter insertion and subsequent failure be investigated "to make sure this does not happen again to other patients."  I explained to Dr. Silverio Decamp that I had no knowledge of who attempted to insert the catheter or the circumstances surrounding the attempt, but I assured him I would ask that this event be reviewed.  I expressed my condolences for the unexpected death of his brother, and the huge loss this will represent to his family.  We then ended the conversation.   Cherene Altes, MD [facility] Office   [phone]  01/20/2020, 10:06 AM

## 2020-02-11 NOTE — Progress Notes (Signed)
[place] for: Heparin bridge one INR <3 (warfarin on hold) Indication: history of mitral valve replacement  Allergies  Allergen Reactions  . Penicillins Nausea And Vomiting    08/23/2018 pt has recently tolerated Amoxicillin per pt and wife Has patient had a PCN reaction causing immediate rash, facial/tongue/throat swelling, SOB or lightheadedness with hypotension: No Has patient had a PCN reaction causing severe rash involving mucus membranes or skin necrosis: No Has patient had a PCN reaction that required hospitalization: Unknown Has patient had a PCN reaction occurring within the last 10 years: No If all of the above answers are "NO", then may proceed with Cephalospor  . Vancomycin Other (See Comments)    Kidney problems    Patient Measurements: Height: 5\' 6"  Weight: 54.9 kg  Vital Signs: Temp: 98 F (36.7 C) (11/07 2058) BP: 129/64 (11/07 2058) Pulse Rate: 66 (11/07 2058)  Labs: Recent Labs    01/17/20 1035 01/17/20 1035 01/17/20 2019 01/18/20 0508 01/18/20 0508 01/18/20 1308 02-Feb-2020 0209  HGB 10.2*   < >  --  7.7*   < > 8.1* 7.3*  HCT 33.4*   < >  --  24.5*  --  26.3* 23.6*  PLT 162   < >  --  169  --  146* 133*  LABPROT 20.5*  --   --  18.5*  --   --  19.1*  INR 1.8*  --   --  1.6*  --   --  1.7*  HEPARINUNFRC 0.77*   < > 0.66 0.62  --   --  0.13*  CREATININE 3.17*  --   --  3.11*  --   --  3.04*   < > = values in this interval not displayed.    Estimated Creatinine Clearance: 14.5 mL/min (A) (by C-G formula based on SCr of 3.04 mg/dL (H)).  Medications: Warfarin PTA for mechanical mitral valve Home dose: Warfarin 1.5 mg PO daily Last dose PTA: 01/12/2020 at 2200  Assessment: 34 y/oM with PMH of rheumatic heart disease s/p mitral valve replacement, stroke on chronic warfarin anticoagulation presented to [facility] ED on 01/12/2020 with worsening back pain after a recent fall. CT abdomen pelvis negative for retroperitoneal  hematoma. Planning for kyphoplasty - tentatively scheduled for 11/9 or 11/10.   Pt's currently on heparin drip in anticipation for procedure.  Significant Events: - Last dose of warfarin: 11/1 - Vitamin K 5 mg IV once on 11/5 at 1014 -- MRI revealed possible small gluteal hematoma  - 11/7: heparin drip d/ced at [place] d/t drop in hgb - 11/7  per Dr. Thereasa Solo, hematuria has improved, ok to resume heparin drip back at 6p  Today, Feb 02, 2020: HL 0.13 subtherapeutic on 571ml/hr Hgb down 7.3 Plts down 133 INR 1.7 Per d/w with RN, no interruptions with heparin infusion and pt still has blood tinged urine   Goal of Therapy:  INR 3-3.5 (per Anticoagulation Clinic notes)  Heparin level 0.3-0.5 units/ml Monitor platelets by anticoagulation protocol: Yes   Plan:  - due bleeding and drop in CBC will not bolus heparin, previously therapeutic on 600 units/hr, will increase heparin drip to 650 units/hr - Heparin level in 8hr - Daily CBC, heparin level, INR - Monitor for worsening of hematoma, severity of hematuria  Dolly Rias RPh 2020/02/02, 2:44 AM

## 2020-02-11 NOTE — Progress Notes (Signed)
Responded to code blue. Pt has 2 working peripheral IV sites. Attempted for 3rd site without success. Notified code lead can do US guided IV if additional access still needed on transition to higher care.

## 2020-02-11 NOTE — Progress Notes (Signed)
[place] for: Heparin bridge one INR <3 (warfarin on hold) Indication: history of mitral valve replacement  Allergies  Allergen Reactions  . Penicillins Nausea And Vomiting    08/23/2018 pt has recently tolerated Amoxicillin per pt and wife Has patient had a PCN reaction causing immediate rash, facial/tongue/throat swelling, SOB or lightheadedness with hypotension: No Has patient had a PCN reaction causing severe rash involving mucus membranes or skin necrosis: No Has patient had a PCN reaction that required hospitalization: Unknown Has patient had a PCN reaction occurring within the last 10 years: No If all of the above answers are "NO", then may proceed with Cephalospor  . Vancomycin Other (See Comments)    Kidney problems    Patient Measurements: Height: 5\' 6"  Weight: 54.9 kg  Vital Signs: Temp: 97.8 F (36.6 C) (11/08 0548) Temp Source: Oral (11/08 0548) BP: 127/80 (11/08 0548) Pulse Rate: 45 (11/08 0548)  Labs: Recent Labs    01/17/20 1035 01/17/20 2019 01/18/20 0508 01/18/20 0508 01/18/20 1308 February 01, 2020 0209 02/01/2020 1046  HGB 10.2*  --  7.7*   < > 8.1* 7.3*  --   HCT 33.4*  --  24.5*  --  26.3* 23.6*  --   PLT 162  --  169  --  146* 133*  --   LABPROT 20.5*  --  18.5*  --   --  19.1*  --   INR 1.8*  --  1.6*  --   --  1.7*  --   HEPARINUNFRC 0.77*   < > 0.62  --   --  0.13* 0.41  CREATININE 3.17*  --  3.11*  --   --  3.04*  --    < > = values in this interval not displayed.    Estimated Creatinine Clearance: 16.1 mL/min (A) (by C-G formula based on SCr of 3.04 mg/dL (H)).  Medications: Warfarin PTA for mechanical mitral valve Home dose: Warfarin 1.5 mg PO daily Last dose PTA: 01/12/2020 at 2200  Assessment: 43 y/oM with PMH of rheumatic heart disease s/p mitral valve replacement, stroke on chronic warfarin anticoagulation presented to [facility] ED on 01/29/2020 with worsening back pain after a recent fall. CT abdomen pelvis  negative for retroperitoneal hematoma on 11/2. Planning for kyphoplasty - tentatively scheduled for 11/9 or 11/10.   Pt's currently on heparin drip in anticipation for procedure.  Significant Events: - Last dose of warfarin: 11/1 - Vitamin K 5 mg IV once on 11/5 at 1014 -- MRI revealed possible small gluteal hematoma  - 11/7: heparin drip d/ced at [place] d/t drop in hgb - 11/7  per Dr. Thereasa Solo, hematuria has improved, ok to resume heparin drip back at 6p - 11/8: Vitamin K 5 mg IV once @ 1044  Today, Feb 01, 2020:  HL = 0.41 is therapeutic on heparin infusion of 650 units/hr  CBC:   Hgb (7.3) remains low, slightly decreased  Plt (133) low, slightly decreased  Confirmed with RN that heparin infusing at correct rate with no interruptions. Pt continues to have hematuria, reported as stable/not worsening - MD aware. No other signs of bleeding  Goal of Therapy:  INR 3-3.5 (per Anticoagulation Clinic notes)  Heparin level 0.3-0.5 units/ml Monitor platelets by anticoagulation protocol: Yes   Plan:   Continue heparin infusion at current rate of 650 units/hr  Check confirmatory HL in 8 hours  CBC, HL daily while on heparin infusion  Monitor for worsening hematuria or other signs of bleeding  Follow for  decision from IR on when to hold heparin prior to kyphoplasty.  Lenis Noon, PharmD 2020-02-04 11:36 AM

## 2020-02-11 NOTE — Anesthesia Procedure Notes (Signed)
Procedure Name: Intubation Performed by: Gean Maidens, CRNA Pre-anesthesia Checklist: Patient identified, Emergency Drugs available, Suction available, Patient being monitored and Timeout performed Patient Re-evaluated:Patient Re-evaluated prior to induction Oxygen Delivery Method: Non-rebreather mask Preoxygenation: Pre-oxygenation with 100% oxygen Induction Type: Rapid sequence Laryngoscope Size: Mac and 4 Grade View: Grade I Tube type: Oral Tube size: 7.5 mm Number of attempts: 1 Airway Equipment and Method: Stylet Placement Confirmation: ETT inserted through vocal cords under direct vision,  CO2 detector and breath sounds checked- equal and bilateral Secured at: 23 cm Tube secured with: Tape Dental Injury: Teeth and Oropharynx as per pre-operative assessment

## 2020-02-11 NOTE — Progress Notes (Addendum)
Robert [name]  RSW:[id number] DOB: October 16, 1940 DOA: 01/31/2020 PCP: Bonnita Nasuti, MD    Brief Narrative:  79yo with a history of DM2, chronic voiding difficulty followed at [facility], CVA, gastric cancer status post gastrectomy, rheumatic heart disease/severe mitral stenosis status post MVR 2015 on chronic warfarin, persistent atrial fibrillation, and systolic CHF with EF 09-38% who presented to the ED with worsening back pain after a recent mechanical fall.  At the time of his fall he was evaluated in the ED and found to have an L2 compression fracture.  He was able to be sent home but his pain has not been controlled with pain medications.  Significant Events:  11/2 admit via ED  Antimicrobials:  None  DVT prophylaxis: Warfarin  Subjective: Has been complaining of some pain in the right arm following a blood draw in that extremity.  Afebrile.  Vital signs stable.  Renal function continues to slowly improve.  Hemoglobin slightly lower today following resumption of heparin.  INR slightly increased today.  Back pain has been poor controlled and patient is having diffuse achy pain as well.  He continues to have large clots through his urethra/in his urine as well.  Assessment & Plan:  Subacute L2 compression fracture status post mechanical fall -intractable low back pain IR following for L2 kyphoplasty likely 11/9 or 11/10 - INR will be 1.4 or < in time for the procedure -still awaiting news on insurance approval for procedure which is apparently the rate limiting step at this time  Acute kidney injury on CKD stage IIIb Baseline creatinine 1.19 May 2019 - creatinine 2.88 at presentation - CT noted increased bilateral perinephric inflammatory stranding and distended bladder but no evidence of hydronephrosis - suspect this is prerenal with poor oral intake in setting of narcotic use and uncontrolled pain -continue to gently hydrate and follow trend - avoid NSAIDs and IV contrast  - Nephrology following - Foley placement was attempted 11/5 but resistance was met and patient refused reattempt with coud catheter  Recent Labs  Lab 01/15/20 0330 01/16/20 0339 01/17/20 1035 01/18/20 0508 2020/02/07 0209  CREATININE 2.95* 3.16* 3.17* 3.11* 3.04*    Gross hematuria A consequence of failed attempt at placing Foley catheter in setting of heparin drip -monitor hemoglobin closely - cont to watch for signs of obstruction caused by clotting -routine bladder scans being obtained - thus far no obstruction, though bleeding does persist - educated pt/wife that bleeding likely to continue in setting of ongong heparin, but that I strongly desire to cont heparin for now - cont to watch Hgb in serial fashion and transfuse is necessary (discussed w/ pt and wife)   Recent Labs  Lab 01/15/20 0934 01/17/20 1035 01/18/20 0508 01/18/20 1308 Feb 07, 2020 0209  HGB 10.1* 10.2* 7.7* 8.1* 7.3*     Acute blood loss anemia Due to gross hematuria with heparin drip - Hgb decreased this am - recheck this afternoon - see discussion above   Persistent valvular atrial fibrillation Continue amiodarone and Coreg -holding Coumadin with need for procedure - establish therapeutic INR goal of 3-3.5 -rate controlled - recheck INR this afternoon   Recent Labs  Lab 01/16/20 0339 01/16/20 1954 01/17/20 1035 01/18/20 0508 2020-02-07 0209  INR 5.2* 2.4* 1.8* 1.6* 1.7*    Mechanical mitral valve replacement 2015 Due to severe mitral stenosis in setting of rheumatic heart disease  Coumadin induced coagulopathy holding Coumadin - goal was to allow to drift down into therapeutic range then transition to  heparin when drops below therapeutic range but with INR climbing and possible gluetal hematoma on MRI was dosed with Vitamin K 5 mg x 1 11/6 with INR improved significantly - dose w/ Vit K 68m again today to assure INR <1.5 by 11/9 - recheck INR this afternoon   Chronic systolic CHF Appears euvolemic  versus mildly dehydrated at present  FAutoliv  01/17/20 0700 01/18/20 0632 [id number]  Weight: 53.2 kg 53 kg 58.8 kg    Chronic constipation Due to narcotic use and low back injury - cont bowel regimen - allow pt to use Trulance from his own home supply   Hypokalemia > Hyperkalemia  Likely a consequence of poor oral intake - corrected w/ supplementation - K+ stable   R arm pain  No evidence of signif edema or erythema on exam   Macrocytic anemia B12 and folate are not low - iron is normal  HTN Blood pressure controlled at this time  HLD Continue usual home medication  DM2 - diet controlled CBG well controlled  Code Status: FULL CODE Family Communication: Lengthy discussion with wife at bedside Status is: Inpatient  Remains inpatient appropriate because:Ongoing active pain requiring inpatient pain management   Dispo: The patient is from: Home              Anticipated d/c is to: Home              Anticipated d/c date is: > 3 days              Patient currently is not medically stable to d/c.   Consultants:  IR  Objective: Blood pressure 127/80, pulse (!) 45, temperature 97.8 F (36.6 C), temperature source Oral, resp. rate 15, height 5' 6"  (1.676 m), weight 58.8 kg, SpO2 99 %.  Intake/Output Summary (Last 24 hours) at [id number] Last data filed at [id number] Gross per 24 hour  Intake 1981.28 ml  Output 887 ml  Net 1094.28 ml   Filed Weights   01/17/20 0700 01/18/20 0632 [id number]  Weight: 53.2 kg 53 kg 58.8 kg    Examination: General: No acute respiratory distress  Lungs: CTA B  Cardiovascular: regular rate - no rub or gallup  Abdomen: NT/ND, soft, bs+, no mass Extremities: No C/C/E B LE   CBC: Recent Labs  Lab 01/18/20 0508 01/18/20 1308 1Nov 20, [id number]  WBC 6.0 5.8 6.2  HGB 7.7* 8.1* 7.3*  HCT 24.5* 26.3* 23.6*  MCV 107.9* 108.7* 108.8*  PLT 169 146* 1540   Basic Metabolic Panel: Recent Labs  Lab 01/20/2020 1600  01/15/20 0330 01/17/20 1035 01/18/20 0508 111/20/210209  NA  --    < > 140 140 140  K  --    < > 4.6 4.2 4.5  CL  --    < > 105 108 111  CO2  --    < > 28 24 21*  GLUCOSE  --    < > 115* 100* 99  BUN  --    < > 40* 41* 41*  CREATININE  --    < > 3.17* 3.11* 3.04*  CALCIUM  --    < > 8.1* 7.9* 7.6*  MG 2.6*  --   --   --   --   PHOS  --   --  3.5 3.4 4.1   < > = values in this interval not displayed.   GFR: Estimated Creatinine Clearance: 16.1 mL/min (A) (by C-G formula based on SCr  of 3.04 mg/dL (H)).  Liver Function Tests: Recent Labs  Lab 01/14/2020 1319 01/18/2020 1319 01/15/20 0330 01/17/20 1035 01/18/20 0508 01/29/2020 0209  AST 37  --  35  --   --   --   ALT 21  --  20  --   --   --   ALKPHOS 68  --  62  --   --   --   BILITOT 2.2*  --  1.9*  --   --   --   PROT 7.2  --  6.4*  --   --   --   ALBUMIN 3.5   < > 3.0* 3.1* 3.0* 2.5*   < > = values in this interval not displayed.    Coagulation Profile: Recent Labs  Lab 01/16/20 0339 01/16/20 1954 01/17/20 1035 01/18/20 0508 01-29-20 0209  INR 5.2* 2.4* 1.8* 1.6* 1.7*    HbA1C: Hgb A1c MFr Bld  Date/Time Value Ref Range Status  01/15/2020 03:33 AM 5.1 4.8 - 5.6 % Final    Comment:    (NOTE) Pre diabetes:          5.7%-6.4%  Diabetes:              >6.4%  Glycemic control for   <7.0% adults with diabetes   12/02/2016 02:36 AM 6.0 (H) 4.8 - 5.6 % Final    Comment:    (NOTE) Pre diabetes:          5.7%-6.4% Diabetes:              >6.4% Glycemic control for   <7.0% adults with diabetes     Recent Results (from the past 240 hour(s))  Respiratory Panel by RT PCR (Flu A&B, Covid) - Nasopharyngeal Swab     Status: None   Collection Time: 01/27/2020  3:08 PM   Specimen: Nasopharyngeal Swab  Result Value Ref Range Status   SARS Coronavirus 2 by RT PCR NEGATIVE NEGATIVE Final    Comment: (NOTE) SARS-CoV-2 target nucleic acids are NOT DETECTED.  The SARS-CoV-2 RNA is generally detectable in upper  respiratoy specimens during the acute phase of infection. The lowest concentration of SARS-CoV-2 viral copies this assay can detect is 131 copies/mL. A negative result does not preclude SARS-Cov-2 infection and should not be used as the sole basis for treatment or other patient management decisions. A negative result may occur with  improper specimen collection/handling, submission of specimen other than nasopharyngeal swab, presence of viral mutation(s) within the areas targeted by this assay, and inadequate number of viral copies (<131 copies/mL). A negative result must be combined with clinical observations, patient history, and epidemiological information. The expected result is Negative.  Fact Sheet for Patients:  PinkCheek.be  Fact Sheet for Healthcare Providers:  GravelBags.it  This test is no t yet approved or cleared by the [place] FDA and  has been authorized for detection and/or diagnosis of SARS-CoV-2 by FDA under an Emergency Use Authorization (EUA). This EUA will remain  in effect (meaning this test can be used) for the duration of the COVID-19 declaration under Section 564(b)(1) of the Act, 21 U.S.C. section 360bbb-3(b)(1), unless the authorization is terminated or revoked sooner.     Influenza A by PCR NEGATIVE NEGATIVE Final   Influenza B by PCR NEGATIVE NEGATIVE Final    Comment: (NOTE) The Xpert Xpress SARS-CoV-2/FLU/RSV assay is intended as an aid in  the diagnosis of influenza from Nasopharyngeal swab specimens and  should not be used as a  sole basis for treatment. Nasal washings and  aspirates are unacceptable for Xpert Xpress SARS-CoV-2/FLU/RSV  testing.  Fact Sheet for Patients: PinkCheek.be  Fact Sheet for Healthcare Providers: GravelBags.it  This test is not yet approved or cleared by the [place] FDA and  has been  authorized for detection and/or diagnosis of SARS-CoV-2 by  FDA under an Emergency Use Authorization (EUA). This EUA will remain  in effect (meaning this test can be used) for the duration of the  Covid-19 declaration under Section 564(b)(1) of the Act, 21  U.S.C. section 360bbb-3(b)(1), unless the authorization is  terminated or revoked. Performed at [facility], [place] [address], [place], [place] [address]      Scheduled Meds: . amiodarone  200 mg Oral Daily  . aspirin EC  81 mg Oral Daily  . atorvastatin  20 mg Oral QHS  . carvedilol  6.25 mg Oral BID WC  . hydrALAZINE  25 mg Oral BID  . lidocaine  1 patch Transdermal Q24H  . Plecanatide  1 tablet Oral Daily  . senna-docusate  1 tablet Oral BID  . sodium chloride flush  3 mL Intravenous Q12H  . tamsulosin  0.8 mg Oral Daily  . vitamin B-12  1,000 mcg Oral Daily     LOS: 6 days   Cherene Altes, MD [facility] Office  [phone] Pager - Text Page per Amion  If 7PM-7AM, please contact night-coverage per Amion 02/14/20, 9:06 AM

## 2020-02-11 NOTE — ED Provider Notes (Signed)
[facility]  Department of Emergency Medicine   Code Blue CONSULT NOTE  Chief Complaint: Cardiac arrest/unresponsive   Level V Caveat: Unresponsive  History of present illness: I was contacted by the hospital for a CODE BLUE cardiac arrest upstairs and presented to the patient's bedside.   History provided by patient's wife who was at the bedside and the nurse. Allegedly patient has been admitted for pain control.  He is getting stronger pain meds.  His p.o. intake has gone down.  Wife was giving him water.  At the time she was asked to leave, she noted that patient was not responding to her stimuli.  She then started noticing some agonal breathing and notified nurse.  The nurse noted that patient was not waking up to any noxious stimuli and CODE BLUE was called.  Patient has history of CHF, CKD.  ROS: Unable to obtain, Level V caveat  Scheduled Meds: . amiodarone  200 mg Oral Daily  . atorvastatin  20 mg Oral QHS  . carvedilol  6.25 mg Oral BID WC  . hydrALAZINE  25 mg Oral BID  . lidocaine  1 patch Transdermal Q24H  . Plecanatide  1 tablet Oral Daily  . senna-docusate  1 tablet Oral BID  . sodium chloride flush  3 mL Intravenous Q12H  . tamsulosin  0.8 mg Oral Daily  . vitamin B-12  1,000 mcg Oral Daily   Continuous Infusions: . heparin 650 Units/hr (14-Feb-2020 0240)  . methocarbamol (ROBAXIN) IV     PRN Meds:.acetaminophen, bisacodyl, magnesium citrate, methocarbamol (ROBAXIN) IV, morphine injection, oxyCODONE, polyethylene glycol, traMADol Past Medical History:  Diagnosis Date  . BPH (benign prostatic hyperplasia)   . Chronic anticoagulation    coumadin for mechanical valves  . CKD (chronic kidney disease), stage III ([place])   . Diabetes mellitus type 2, diet-controlled ([place])   . Gastric cancer ([place]) 2005   s/p gastrectomy  . High cholesterol   . HOH (hard of hearing)   . Hypertension   . Rheumatic heart disease   . S/P MVR (mitral valve replacement)  2015   91mm StJude mechanical valve  . Stroke [facility])    Past Surgical History:  Procedure Laterality Date  . CARDIAC CATHETERIZATION N/A 05/10/2015   Procedure: Right/Left Heart Cath and Coronary Angiography;  Surgeon: Burnell Blanks, MD;  Location: [facility];  Service: Cardiovascular;  Laterality: N/A;  . COLONOSCOPY  11/22/2017   [facility]    . IR ANGIO INTRA EXTRACRAN SEL COM CAROTID INNOMINATE BILAT MOD SED  12/01/2016  . IR ANGIO VERTEBRAL SEL SUBCLAVIAN INNOMINATE UNI R MOD SED  12/01/2016  . MITRAL VALVE REPLACEMENT  10/24/2013   58mm StJude mechanical valve  . PARTIAL GASTRECTOMY  2005  . SMALL BOWEL ENTEROSCOPY  11/22/2017   [facility]    Social History   Socioeconomic History  . Marital status: Married    Spouse name: Not on file  . Number of children: Not on file  . Years of education: Not on file  . Highest education level: Not on file  Occupational History  . Occupation: Astronomer professor     Employer: [employer]  Tobacco Use  . Smoking status: Never Smoker  . Smokeless tobacco: Never Used  Vaping Use  . Vaping Use: Never used  Substance and Sexual Activity  . Alcohol use: No    Alcohol/week: 0.0 standard drinks  . Drug use: No  . Sexual activity: Not  Currently  Other Topics Concern  . Not on file  Social History Narrative   Lives in [place] with his wife.   Social Determinants of Health   Financial Resource Strain:   . Difficulty of Paying Living Expenses: Not on file  Food Insecurity:   . Worried About Charity fundraiser in the Last Year: Not on file  . Ran Out of Food in the Last Year: Not on file  Transportation Needs:   . Lack of Transportation (Medical): Not on file  . Lack of Transportation (Non-Medical): Not on file  Physical Activity:   . Days of Exercise per Week: Not on file  . Minutes of Exercise per Session: Not on file  Stress:   . Feeling of Stress : Not on file  Social  Connections:   . Frequency of Communication with Friends and Family: Not on file  . Frequency of Social Gatherings with Friends and Family: Not on file  . Attends Religious Services: Not on file  . Active Member of Clubs or Organizations: Not on file  . Attends Archivist Meetings: Not on file  . Marital Status: Not on file  Intimate Partner Violence:   . Fear of Current or Ex-Partner: Not on file  . Emotionally Abused: Not on file  . Physically Abused: Not on file  . Sexually Abused: Not on file   Allergies  Allergen Reactions  . Penicillins Nausea And Vomiting    08/23/2018 pt has recently tolerated Amoxicillin per pt and wife Has patient had a PCN reaction causing immediate rash, facial/tongue/throat swelling, SOB or lightheadedness with hypotension: No Has patient had a PCN reaction causing severe rash involving mucus membranes or skin necrosis: No Has patient had a PCN reaction that required hospitalization: Unknown Has patient had a PCN reaction occurring within the last 10 years: No If all of the above answers are "NO", then may proceed with Cephalospor  . Vancomycin Other (See Comments)    Kidney problems    Last set of Vital Signs (not current) Vitals:   February 01, 2020 0548 Feb 01, 2020 2047  BP: 127/80   Pulse: (!) 45   Resp: 15 (!) 24  Temp: 97.8 F (36.6 C)   SpO2: 99%       Physical Exam Gen: unresponsive Cardiovascular: pulseless  Resp: apneic. Breath sounds equal bilaterally with bagging  Abd: nondistended  Neuro: GCS 3, unresponsive to pain  HEENT: No blood in posterior pharynx, gag reflex absent  Neck: No crepitus  Musculoskeletal: No deformity  Skin: warm   CRITICAL CARE Performed by: Anthone Prieur Total critical care time: 35 min Critical care time was exclusive of separately billable procedures and treating other patients. Critical care was necessary to treat or prevent imminent or life-threatening deterioration. Critical care was time spent  personally by me on the following activities: development of treatment plan with patient and/or surrogate as well as nursing, discussions with consultants, evaluation of patient's response to treatment, examination of patient, obtaining history from patient or surrogate, ordering and performing treatments and interventions, ordering and review of laboratory studies, ordering and review of radiographic studies, pulse oximetry and re-evaluation of patient's condition.  Cardiopulmonary Resuscitation (CPR) Procedure Note Directed/Performed by: Yazlyn Wentzel I personally directed ancillary staff and/or performed CPR in an effort to regain return of spontaneous circulation and to maintain cardiac, neuro and systemic perfusion.    Medical Decision making   I responded to CODE BLUE on the floor.  Patient had received 1 round of  epi and was getting CPR when I arrived. CBG was ordered and the blood glucose was 34.  Amp of D50 given.  I noted that patient has history of CKD and family reported that he was having reduced p.o. intake.  Likely the low glucose was secondary to malnutrition.  It also appears that patient is getting strong pain meds.  He is opioid nave.  It is possible that he went into respiratory arrest because of narcotic medications.  We were able to get ROSC after 4 rounds of epi, bicarb, dextrose, calcium gluconate, Narcan.  Family updated. Discussed case with patient's family and ICU team.   Assessment and Plan  Move to ICU.    Varney Biles, MD Feb 09, 2020 [phone]

## 2020-02-11 NOTE — Progress Notes (Signed)
Several attempts have been made since the start of this shift to change patient's linens and clean him up due to incontinence of urine.  Patient has refused all attempts to do so and continues to refuse.  Patient and family educated on importance of skin care and continue to decline to be cleaned

## 2020-02-11 NOTE — Progress Notes (Signed)
This nurse entering room to begin pt assessment finding spouse at the bedside trying to wake pt to give fluids with straw in pts mouth. I advised against if pt not alert. Attempted to call out to pt unresponsive and initiated sternal rub.. While conducting sternal rub RN Jacqulyn Bath came in to report that per CCMD HR was dropping. CODE Blue called, compressions started , crash cart retrieved

## 2020-02-11 DEATH — deceased

## 2020-03-13 NOTE — Discharge Summary (Signed)
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  At the time of his fall he was evaluated in the ED and found to have an L2 compression fracture.  He was initially able to be sent home but his pain was not controlled with pain medications and he returned to the ED and was admitted for a possible intervention.  Hospital Course:  The patient was admitted to the acute unit with the plan to stabilize him medically prior to performing a kyphoplasty in hopes of improving his severe back pain and restoring his ambulatory status/quality of life. He had been essentially bed bound since his injury. IR was consulted and was working to arrange for a kyphoplasty. There was a delay in approval from his insurance company which prolonged the wait for this procedure. His interim hospital stay was complicated by severe ongoing pain. The pain was so severe it often lead to him not participating in attempted therapy, and even polite intermittent decline of attempts at routine nursing care/vitals  assessments/lab draws. His home narcotic dose was not sufficient to control his debilitating pain, and therefore titration of his pain meds was being carried out during his inpatient stay. He was monitored closely for excessive sedation or respiratory depression th/o his stay.   A coagulopathy related to his long term use of coumadin was another factor requiring attention. Reversal of his coumadin was initially delayed due to his dual indication for anticoagulation as well as the delay encountered in scheduling his spinal procedure. When it appeared he was approaching the time of his procedure, it was felt most appropriate to bridge him with heparin to allow the shortest interval of time without anticoagulation as possible.  Acute renal failure was another challenge encountered in his hospital stay. There was concern for bladder outlet obstruction as a contributing factor in the face of a history of urologic issues. A foley catheter was ordered to be placed, but was unsuccessful when attempted by the RN. As a result the patient developed ongoing issues with gross hematuria. This was monitored closely with serial creatinine and Hgb checks.  In regard to his anemia, the patient's hemoglobin was noted to have dropped from 10.2 to 7.7 between checks on 11/6 and 11/7. This prompted a repeat Hgb 11/7 afternoon, as well as 02-11-23 AM. These labs suggested an ongoing fluctuation, possibly related to gross hematuria alone, though other sources of blood loss were considered. In the setting of ongoing necessary anticoagulation the medical team felt transfusion on 11-Feb-2023 afternoon was in the best interest of the patient. He was at the time hemodynamically stable, but it was felt most appropriate to keep his Hgb at 8 or > given  his risk for ongoing blood loss. Though they initially agreed when counseled by the ordering MD, the family at bedside ultimately decided to decline the transfusion.  Unfortunately, and unexpectedly,  the patient suffered an acute arrest 2023/01/25 evening. Due to significant pain in his back, he had politely declined vitals checks or routine RN care in the hours prior to his arrest. He had been experiencing an increase in his poorly controlled back pain th/o the course of that day. The Code Team promptly presented to his room at the time of his arrest, and he was initially able to be resuscitated. He was emergently transferred to the ICU, and PCCM consulted. Unfortunately he suffered a PEA arrest upon arrival in the ICU. Despite ongoing aggressive resuscitation attempts, the gentleman did not survive. He was declared dead at 21:44 on 01-25-20. The cause of his death was not clear, and therefore his case was referred to the Medical Examiner.   Below are the active issues being addressed at the time of his unexpected arrest:  Subacute L2 compression fracture status post mechanical fall -intractable low back pain IR following for L2 kyphoplasty likely 11/9 or 11/10 - INR will be 1.4 or < in time for the procedure -still awaiting news on insurance approval for procedure which is apparently the rate limiting step at this time  Acute kidney injury on CKD stage IIIb Baseline creatinine 1.19 May 2019 - creatinine 2.88 at presentation - CT noted increased bilateral perinephric inflammatory stranding and distended bladder but no evidence of hydronephrosis - suspect this is prerenal with poor oral intake in setting of narcotic use and uncontrolled pain -continue to gently hydrate and follow trend - avoid NSAIDs and IV contrast - Nephrology following - Foley placement was attempted 11/5 but resistance was met and patient refused reattempt with coud catheter  Gross hematuria A consequence of failed attempt at placing Foley catheter in setting of heparin drip -monitor hemoglobin closely - cont to watch for signs of obstruction caused by clotting -routine bladder scans being obtained - thus far no obstruction, though  bleeding does persist - educated pt/wife that bleeding likely to continue in setting of ongong heparin, but that I strongly desire to cont heparin for now - cont to watch Hgb in serial fashion and transfuse as necessary (discussed w/ pt and wife)   Acute blood loss anemia Due to gross hematuria with heparin drip - Hgb decreased this am - recheck this afternoon - see discussion above   Persistent valvular atrial fibrillation Continue amiodarone and Coreg -holding Coumadin with need for procedure - establish therapeutic INR goal of 3-3.5 -rate controlled - recheck INR this afternoon   Mechanical mitral valve replacement 2015 Due to severe mitral stenosis in setting of rheumatic heart disease  Coumadin induced coagulopathy holding Coumadin - goal was to allow to drift down into therapeutic range then transition to heparin when drops below therapeutic range but with INR climbing and possible gluetal hematoma on MRI was dosed with Vitamin K 5 mg x 1 11/6 with INR improved significantly - dose w/ Vit K 62m again today to assure INR <1.5 by 11/9 - recheck INR this afternoon   Chronic systolic CHF Appears euvolemic versus mildly dehydrated at present  Chronic constipation Due to narcotic use and low back injury - cont bowel regimen - allow pt to use Trulance from his own home supply   Hypokalemia > Hyperkalemia  Likely a consequence of poor oral intake - corrected w/ supplementation - K+ stable  R arm pain  No evidence of signif edema or erythema on exam   Macrocytic anemia B12 and folate are not low - iron is normal  HTN Blood pressure controlled at this time  HLD Continue usual home medication  DM2 - diet controlled CBG well controlled   Signed:  Cherene Altes  [facility] 02/19/2020, 8:24 AM

## 2020-03-25 ENCOUNTER — Ambulatory Visit: Payer: BC Managed Care – PPO | Admitting: Internal Medicine

## 2020-12-27 IMAGING — MR MR LUMBAR SPINE W/O CM
5 series · 31 of 48 positions shown · non-contrast
Comparison: CT of the lumbar spine January 13, 2020.

CLINICAL DATA: L2 compression fracture.

EXAM:
MRI LUMBAR SPINE WITHOUT CONTRAST
TECHNIQUE: Multiplanar, multisequence MR imaging of the lumbar spine was
performed. No intravenous contrast was administered.

[Series 5: T1 · sagittal · 4.0mm · 0.81mm/px · 6 of 19 slices shown (1 of 2)]
[im 1/19]
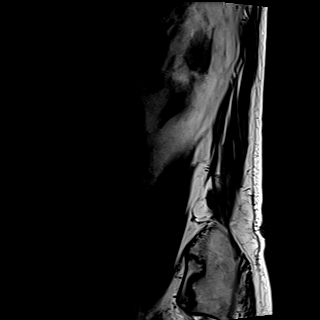
[im 4/19]
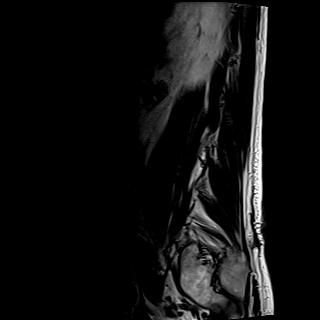
[im 8/19]
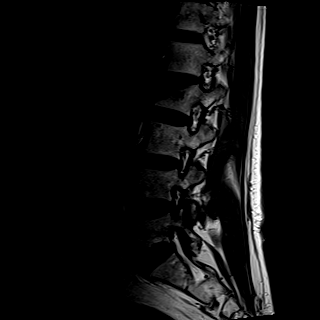
[im 11/19]
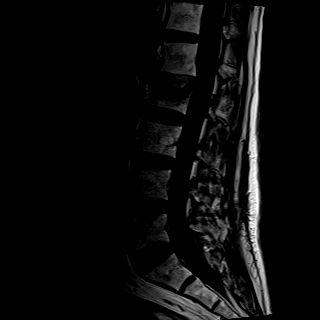
[im 15/19]
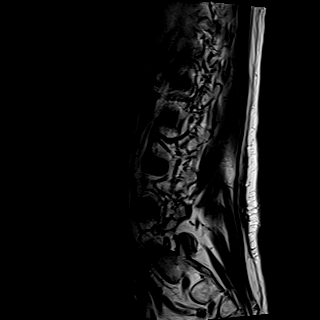
[im 19/19]
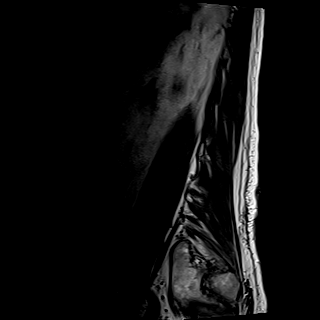

[Series 6: T2 · sagittal · 4.0mm · 0.81mm/px · 6 of 19 slices shown (1 of 2)]
[im 1/19]
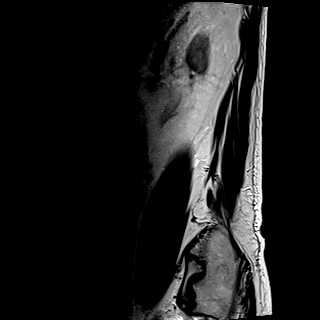
[im 4/19]
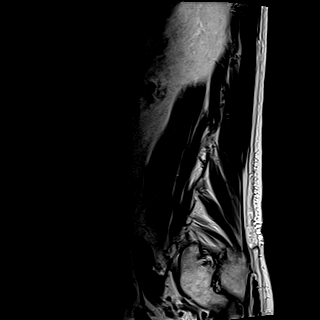
[im 8/19]
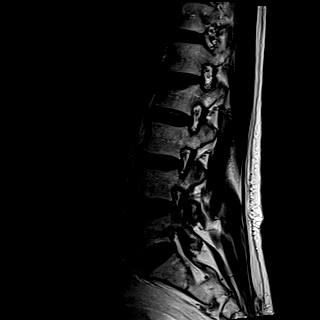
[im 11/19]
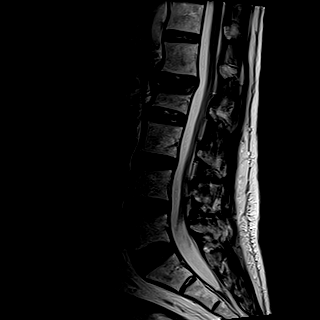
[im 15/19]
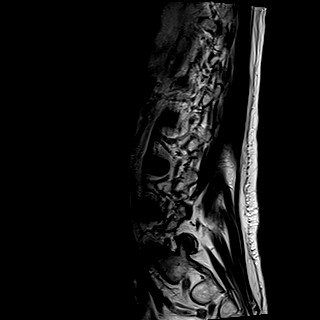
[im 19/19]
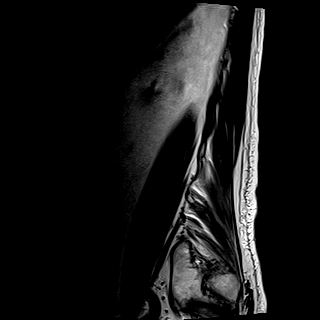

[Series 7: STIR · sagittal · 4.0mm · 0.51mm/px · 1 of 19 slices shown]
[im 1/19]
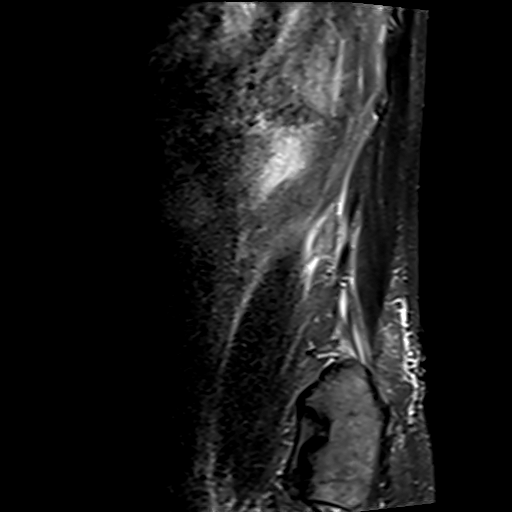

[Series 8: T2 · axial · 4.0mm · 0.62mm/px · z∈[+37,+267]mm · 9 of 44 slices shown (2 of 2)]
[im 1/44]
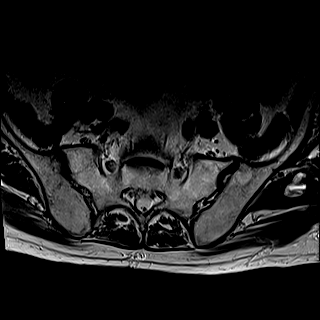
[im 7/44]
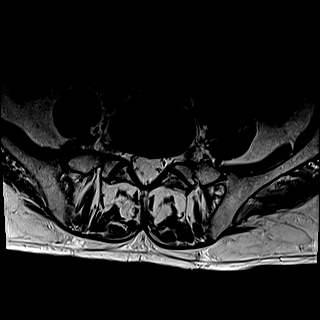
[im 13/44]
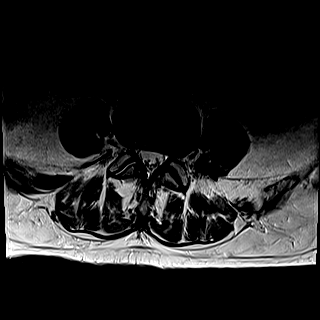
[im 19/44]
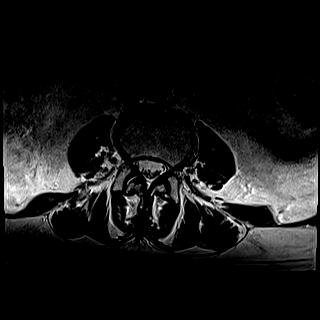
[im 22/44]
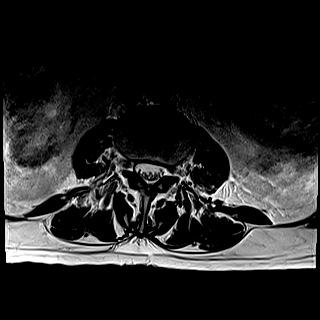
[im 25/44]
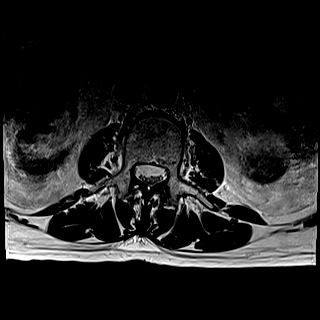
[im 31/44]
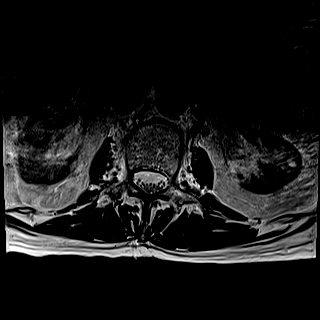
[im 37/44]
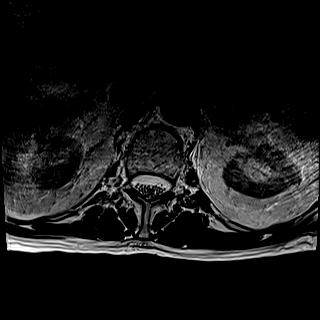
[im 44/44]
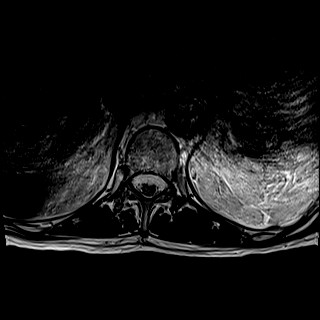

[Series 9: T1 · axial · 4.0mm · 0.39mm/px · z∈[+37,+267]mm · 9 of 44 slices shown (2 of 2)]
[im 1/44]
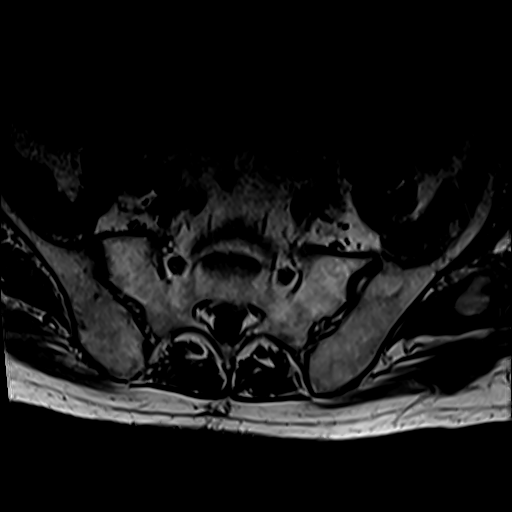
[im 7/44]
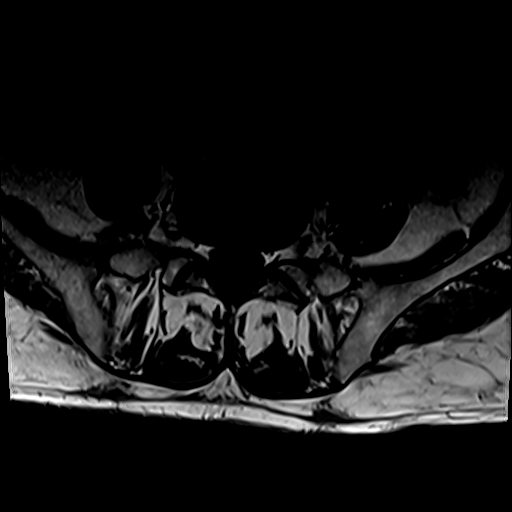
[im 13/44]
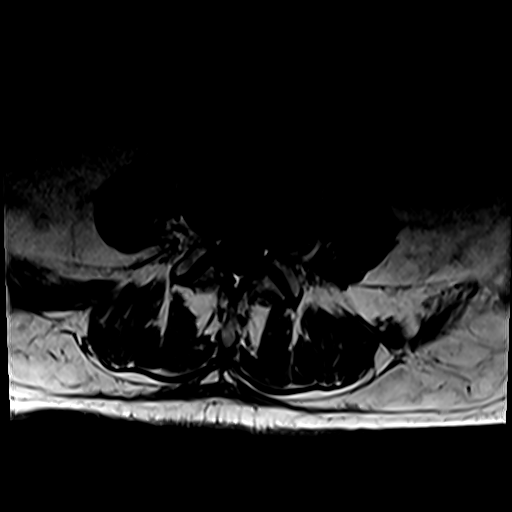
[im 19/44]
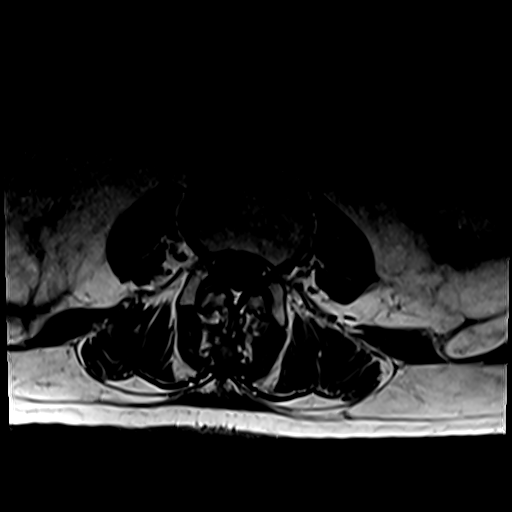
[im 22/44]
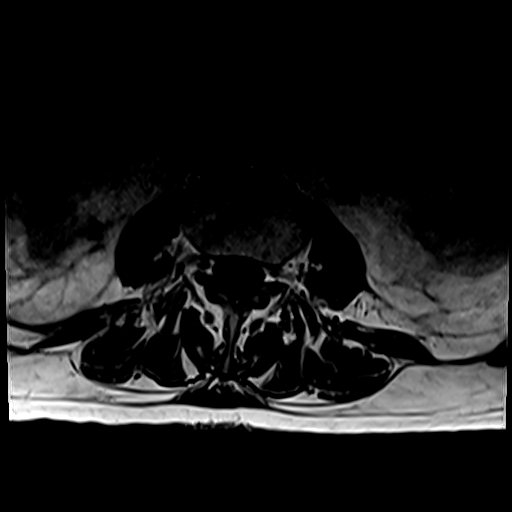
[im 25/44]
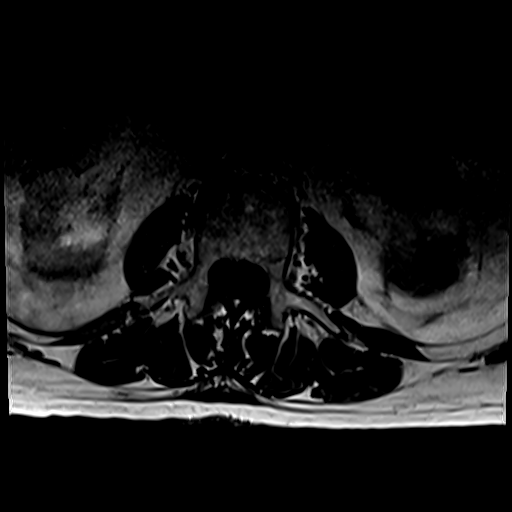
[im 31/44]
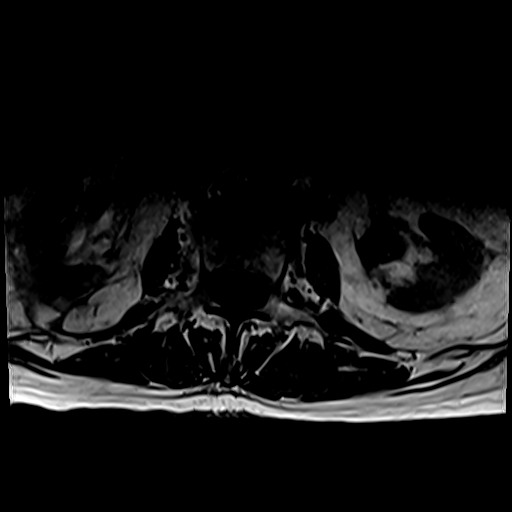
[im 37/44]
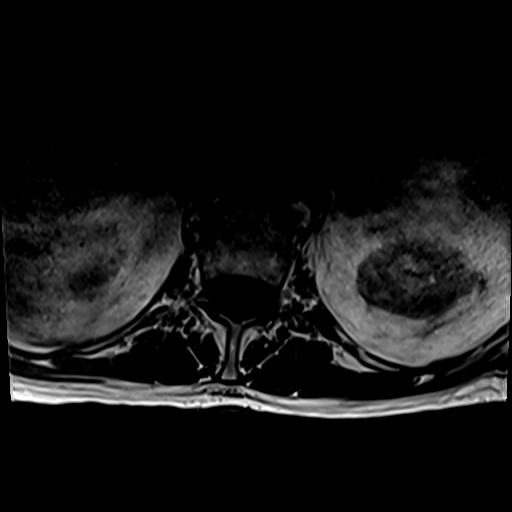
[im 44/44]
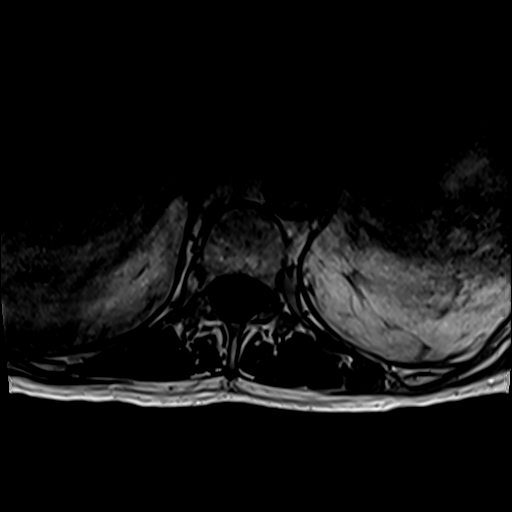

[31 of 48 positions shown; findings below may reference images not displayed]

FINDINGS: Segmentation:  Standard.

Alignment:  Physiologic.

Vertebrae: Compression fracture of the superior endplate of L2 with
associated marrow edema, suggesting acute/subacute fracture. There
is approximately 30% loss of vertebral body height with no
retropulsion. No discitis or aggressive bone lesion

Conus medullaris and cauda equina: Conus extends to the L1 level.
Conus and cauda equina appear normal.

Paraspinal and other soft tissues: Edema in the paraspinal
musculature in the lower lumbar spine. T1 and T2 hyperintense lesion
with hypointense rim within the left gluteus median, measuring at
least 1.9 cm, likely a small hematoma.

Disc levels:

L1-2: No spinal canal or neural foraminal stenosis.

L2-3: No spinal canal or neural foraminal stenosis.

L3-4: Shallow left asymmetric disc bulge and mild facet degenerative
changes resulting in minimal narrowing of the left neural foramen.
No spinal canal stenosis.

L4-5: Disc bulge with left foraminal/far lateral disc protrusion and
annular tear, mild facet degenerative changes resulting in minimal
narrowing of the left neural foramen. No spinal canal stenosis.

L5-S1: No spinal canal or neural foraminal stenosis.
IMPRESSION: 1. Acute/subacute compression fracture of the superior endplate of
L2 with approximately 30% loss of vertebral body height. No
retropulsion.
2. Mild lumbar degenerative changes without significant spinal canal
or neural foraminal stenosis.
3. Edema in the paraspinal musculature in the lower lumbar spine.
